# Patient Record
Sex: Male | Born: 1970 | Race: White | Hispanic: No | Marital: Married | Smoking: Current every day smoker
Health system: Southern US, Community
[De-identification: ages and names within clinical notes are randomized; demographics above are authoritative.]

## PROBLEM LIST (undated history)

## (undated) DIAGNOSIS — I10 Essential (primary) hypertension: Secondary | ICD-10-CM

## (undated) DIAGNOSIS — I639 Cerebral infarction, unspecified: Secondary | ICD-10-CM

## (undated) DIAGNOSIS — J45909 Unspecified asthma, uncomplicated: Secondary | ICD-10-CM

## (undated) HISTORY — PX: BACK SURGERY: SHX140

## (undated) HISTORY — DX: Unspecified asthma, uncomplicated: J45.909

---

## 2005-04-07 HISTORY — PX: FRACTURE SURGERY: SHX138

## 2006-04-28 ENCOUNTER — Ambulatory Visit (HOSPITAL_BASED_OUTPATIENT_CLINIC_OR_DEPARTMENT_OTHER): Admission: RE | Admit: 2006-04-28 | Discharge: 2006-04-29 | Payer: Self-pay | Admitting: Orthopaedic Surgery

## 2013-06-17 ENCOUNTER — Emergency Department: Payer: Self-pay | Admitting: Emergency Medicine

## 2013-08-12 ENCOUNTER — Emergency Department: Payer: Self-pay | Admitting: Internal Medicine

## 2014-05-20 ENCOUNTER — Emergency Department: Payer: Self-pay | Admitting: Internal Medicine

## 2017-08-28 ENCOUNTER — Ambulatory Visit (INDEPENDENT_AMBULATORY_CARE_PROVIDER_SITE_OTHER): Payer: Self-pay

## 2017-08-28 ENCOUNTER — Other Ambulatory Visit: Payer: Self-pay

## 2017-08-28 ENCOUNTER — Encounter: Payer: Self-pay | Admitting: Urgent Care

## 2017-08-28 ENCOUNTER — Ambulatory Visit: Payer: Self-pay | Admitting: Urgent Care

## 2017-08-28 VITALS — BP 140/100 | HR 88 | Temp 98.2°F | Resp 18 | Ht 69.0 in | Wt 138.0 lb

## 2017-08-28 DIAGNOSIS — T148XXA Other injury of unspecified body region, initial encounter: Secondary | ICD-10-CM

## 2017-08-28 DIAGNOSIS — R030 Elevated blood-pressure reading, without diagnosis of hypertension: Secondary | ICD-10-CM

## 2017-08-28 DIAGNOSIS — S270XXA Traumatic pneumothorax, initial encounter: Secondary | ICD-10-CM

## 2017-08-28 DIAGNOSIS — S2243XA Multiple fractures of ribs, bilateral, initial encounter for closed fracture: Secondary | ICD-10-CM

## 2017-08-28 DIAGNOSIS — S22050A Wedge compression fracture of T5-T6 vertebra, initial encounter for closed fracture: Secondary | ICD-10-CM

## 2017-08-28 DIAGNOSIS — I10 Essential (primary) hypertension: Secondary | ICD-10-CM

## 2017-08-28 DIAGNOSIS — S2249XA Multiple fractures of ribs, unspecified side, initial encounter for closed fracture: Secondary | ICD-10-CM

## 2017-08-28 MED ORDER — LISINOPRIL 10 MG PO TABS: 10.0000 mg | ORAL_TABLET | Freq: Every day | ORAL | 1 refills | Status: DC

## 2017-08-28 MED ORDER — OXYCODONE HCL 5 MG PO TABS: 5.0000 mg | ORAL_TABLET | Freq: Four times a day (QID) | ORAL | 0 refills | Status: AC | PRN

## 2017-08-28 NOTE — Patient Instructions (Addendum)
If you develop chest pain, swelling of the rib area including her lateral ribs or back, shortness of breath or difficulty breathing then please report to the ER immediately as this may be a sign that your rib fractures are put you at risk of life-threatening emergency.  Likewise, for the buttock area if you develop worsening pain, drainage of pus or swelling, fever, nausea, vomiting, belly pain then report to the ER for this is well as this is an exposed wound and can form a serious infection.  Your referral to cardiothoracic surgery may take up to 2 weeks to schedule so please feel free to come back here and see me if you have any questions or concerns.    IF you received an x-ray today, you will receive an invoice from [facility]. Please contact [facility] at [phone] with questions or concerns regarding your invoice.   IF you received labwork today, you will receive an invoice from [place]. Please contact [facility] at [phone] with questions or concerns regarding your invoice.   Our billing staff will not be able to assist you with questions regarding bills from these companies.  You will be contacted with the lab results as soon as they are available. The fastest way to get your results is to activate your My Chart account. Instructions are located on the last page of this paperwork. If you have not heard from [place] regarding the results in 2 weeks, please contact this office.

## 2017-08-28 NOTE — Progress Notes (Signed)
MRN: [id number] DOB: [birth year]  Subjective:   Ihan Pat is a 47 y.o. male presenting for follow up on traumatic fractures suffered 2 weeks ago on Aug 11, 2017.  Patient leaned over a deck too far and fell 27 feet onto his neck.  This happened while he was in [place] and was hospitalized, found to have multiple rib fractures, right index finger fracture, torn scrotum, skin avulsion of the buttock.  He was stabilized and discharged, recommended follow-up here with a PCP.  Today, patient reports that he is dramatically improved.  He states that he has not felt dizzy, headaches, confusion, nausea, vomiting, fever, belly pain, drainage of pus or bleeding from his wound over his buttock.  He states that his scrotal tear has healed very well.  He is doing dressing changes over his skin avulsion on the buttock.  He has been using incentive spirometry and reports that his breathing is improved significantly.  He is taking Tylenol and ibuprofen scheduled.  He is also using oxycodone about 4 times a day for his pain.  Patient lives here in [place] and does not have a primary care practice.  He is not opposed to a referral at this point.  Patient has a history of elevated blood pressure and has not started medication for this.  He is a former smoker.  [place] has a current medication list which includes the following prescription(s): ibuprofen, oxycodone, and lisinopril. Also has No Known Allergies.  Zachory  has no past medical history on file. Also  has no past surgical history on file.  Objective:   Vitals: BP (!) 140/100 (BP Location: Right Arm, Patient Position: Sitting, Cuff Size: Normal)   Pulse 88   Temp 98.2 F (36.8 C) (Oral)   Resp 18   Ht  (1.753 m)   Wt 138 lb (62.6 kg)   SpO2 99%   BMI 20.38 kg/m   Physical Exam  Constitutional: He is oriented to person, place, and time. He appears well-developed and well-nourished.  HENT:  Mouth/Throat: Oropharynx is clear and moist.    Voice is hoarse.  Eyes: Pupils are equal, round, and reactive to light. EOM are normal.  Neck: No tracheal deviation present.  Cardiovascular: Normal rate, regular rhythm, normal heart sounds and intact distal pulses. Exam reveals no gallop and no friction rub.  No murmur heard. Pulmonary/Chest: No stridor. No respiratory distress. He has no wheezes. He has no rales.  Musculoskeletal:       Back:  Neurological: He is alert and oriented to person, place, and time. No cranial nerve deficit.  Speech intact.  Skin: Skin is warm and dry.  Psychiatric: He has a normal mood and affect.    Dg Chest 2 View  Result Date: 08/28/2017 CLINICAL DATA:  47 y/o M; pneumothorax, rib fractures, fall on 08/11/2017. EXAM: CHEST - 2 VIEW COMPARISON:  None. FINDINGS: Right 2-7 posterior and lateral acute displaced rib fractures. Left 5-8 acute posterolateral displaced rib fractures. No pneumothorax identified. Normal cardiac silhouette. Streaky opacities in the lung bases probably represents atelectasis from splinting. Moderate anterior compression deformity of T6 vertebral body. IMPRESSION: 1. Right 2-7 and left 5-8 acute displaced rib fractures. 2. T6 moderate anterior compression deformity. 3. No pneumothorax identified. 4. Streaky opacities in lung bases, probably atelectasis from splinting. Electronically Signed   By: Mitzi Hansen M.D.   On: 08/28/2017 15:05    Assessment and Plan :   Traumatic pneumothorax, initial encounter - Plan: DG Chest 2 View,  Ambulatory referral to Cardiothoracic Surgery  Closed fracture of multiple ribs of both sides, initial encounter - Plan: DG Chest 2 View, Ambulatory referral to Cardiothoracic Surgery  Pain due to fracture  Compression fracture of T6 vertebra (HCC) - Plan: Ambulatory referral to Cardiothoracic Surgery  Closed traumatic displaced fracture of multiple ribs - Plan: Ambulatory referral to Cardiothoracic Surgery  Essential hypertension  Elevated  blood pressure reading  Case precepted with Dr. Alvy Bimler.  Patient agreed to referral to cardiothoracic surgery.  Counseled on ER precautions.  Patient is to continue scheduling Tylenol and ibuprofen, dosing instructions provided.  I refilled his oxycodone for management of his fracture pain.  Patient is to start lisinopril for his elevated blood pressure.  Monitoring parameters given.  Patient is to return to clinic in 4 weeks for recheck.  Patient is to continue dressing changes over his skin avulsion.  ER precautions reviewed for this as well.  Wallis Bamberg, PA-C [facility] [phone] 08/28/2017 2:31 PM

## 2017-09-05 ENCOUNTER — Telehealth: Payer: Self-pay | Admitting: General Practice

## 2017-09-05 NOTE — Telephone Encounter (Signed)
Pt. Calling to request refill on Oxycodone as he has not yet gotten the referral requested by PA. Mani. [facility] [address]

## 2017-09-08 ENCOUNTER — Encounter (HOSPITAL_COMMUNITY): Payer: Self-pay | Admitting: Emergency Medicine

## 2017-09-08 ENCOUNTER — Other Ambulatory Visit: Payer: Self-pay

## 2017-09-08 ENCOUNTER — Emergency Department (HOSPITAL_COMMUNITY)
Admission: EM | Admit: 2017-09-08 | Discharge: 2017-09-08 | Disposition: A | Discharge status: Home / Self Care | Payer: Self-pay | Attending: Emergency Medicine | Admitting: Emergency Medicine

## 2017-09-08 DIAGNOSIS — I10 Essential (primary) hypertension: Secondary | ICD-10-CM | POA: Insufficient documentation

## 2017-09-08 DIAGNOSIS — F17200 Nicotine dependence, unspecified, uncomplicated: Secondary | ICD-10-CM | POA: Insufficient documentation

## 2017-09-08 DIAGNOSIS — Z79899 Other long term (current) drug therapy: Secondary | ICD-10-CM | POA: Insufficient documentation

## 2017-09-08 DIAGNOSIS — K6289 Other specified diseases of anus and rectum: Secondary | ICD-10-CM | POA: Insufficient documentation

## 2017-09-08 DIAGNOSIS — W19XXXD Unspecified fall, subsequent encounter: Secondary | ICD-10-CM | POA: Insufficient documentation

## 2017-09-08 HISTORY — DX: Essential (primary) hypertension: I10

## 2017-09-08 MED ORDER — OXYCODONE HCL 5 MG PO TABS: 5.0000 mg | ORAL_TABLET | Freq: Four times a day (QID) | ORAL | 0 refills | Status: AC | PRN

## 2017-09-08 NOTE — ED Triage Notes (Signed)
States had a fall 3 weeks ago broke 5 ribs and tore his rectal area , did this in [place], ICU for 5 days , saw  [facility]   1 week ago and today he his having pain I n torn rectal area,  And back pain

## 2017-09-08 NOTE — ED Notes (Signed)
ED Provider at bedside. 

## 2017-09-08 NOTE — ED Provider Notes (Signed)
**Note Isaac-Identified via Obfuscation** [facility] EMERGENCY DEPARTMENT Provider Note  CSN: [id number] Arrival date & time: 09/08/17  1116  History   Chief Complaint Chief Complaint  Patient presents with  . Rectal Pain   HPI Isaac Meza is a 47 y.o. male with no signifcant medical history who presented to the ED for rectal pain. Patient fell down a [place] in [place] 3-4 weeks ago where he was hospitalized for a week and had 7 broken ribs, a tear in his scrotum and trauma to his buttocks. During that hospitalization, he underwent laceration repair on his scrotum and debridement for the wound in his buttocks. He has not been seen by a general surgeon in [place] for follow-up regarding treatment for it.   Associated symptoms: none. Denies fever, chills, abdominal pain, N/V, change in bowel or urinary habits. He states that there is blood sometimes from the wound site with clear discharge, but has not noticed any acute changes in this. Patient went to urgent care on 08/28/17, but did not receive instruction for follow-up on his buttock wound. Patient came to the ED because it continues to have buttock pain that it is not being relieved with Tylenol or ibuprofen.   Medical records from ED visit and hospitalization in [place] at the time of the initial injury could not be found in Epic. Father assisted in providing history above as he was present at the time of the accident.  Past Medical History:  Diagnosis Date  . Hypertension     There are no active problems to display for this patient.   History reviewed. No pertinent surgical history.      Home Medications    Prior to Admission medications   Medication Sig Start Date End Date Taking? Authorizing Provider  IBUPROFEN PO Take by mouth.    [provider]  lisinopril (PRINIVIL,ZESTRIL) 10 MG tablet Take 1 tablet (10 mg total) by mouth daily. 08/28/17   Wallis Bamberg, PA-C  oxyCODONE (ROXICODONE) 5 MG immediate release tablet Take 1 tablet (5 mg total) by  mouth every 6 (six) hours as needed for up to 7 days for severe pain. 09/08/17 09/15/17  Sandrea Boer, Sharyon Medicus, PA-C    Family History No family history on file.  Social History Social History   Tobacco Use  . Smoking status: Current Every Day Smoker  . Smokeless tobacco: Never Used  Substance Use Topics  . Alcohol use: Yes  . Drug use: Never     Allergies   Patient has no known allergies.   Review of Systems Review of Systems  Constitutional: Negative for appetite change, chills, fatigue and fever.  Gastrointestinal: Positive for rectal pain. Negative for abdominal distention, abdominal pain, anal bleeding, blood in stool, constipation, diarrhea, nausea and vomiting.  Endocrine: Negative.   Genitourinary: Negative.  Negative for discharge, hematuria, scrotal swelling and testicular pain.  Skin: Positive for wound.     Physical Exam Updated Vital Signs BP (!) 152/108 (BP Location: Right Arm)   Pulse 87   Temp 97.7 F (36.5 C) (Oral)   Resp 16   SpO2 100%   Physical Exam  Constitutional: He appears well-developed and well-nourished. No distress.  Cardiovascular: Normal rate, regular rhythm, normal heart sounds and intact distal pulses.  No murmur heard. Pulmonary/Chest: Effort normal and breath sounds normal.  Abdominal: Soft. Normal appearance and bowel sounds are normal. There is no tenderness.  Genitourinary: Testes normal and penis normal. Rectal exam shows anal tone normal.    Right testis shows no  swelling and no tenderness. Left testis shows no swelling and no tenderness. Circumcised. No penile erythema or penile tenderness.  Genitourinary Comments: ~3x3cm wound on right buttock that is ~0.883mm deep. Wound is well healing. No erythema, warmth, drainage or bleeding in or around wound. Wound is tender to palpation. No signs of infection.   Skin: Skin is warm and dry. He is not diaphoretic. No erythema.  Nursing note and vitals reviewed.    ED Treatments /  Results  Labs (all labs ordered are listed, but only abnormal results are displayed) Labs Reviewed - No data to display  EKG None  Radiology No results found.  Procedures Procedures (including critical care time)  Medications Ordered in ED Medications - No data to display   Initial Impression / Assessment and Plan / ED Course  Triage vital signs and the nursing notes have been reviewed.  Pertinent labs & imaging results that were available during care of the patient were reviewed and considered in medical decision making (see chart for details).   Patient presents in no acute distress and is visibly uncomfortable. Afebrile with normal vital signs. Patient has no systemic s/s to suggest an intra-abdominal process or widespread infection. Rectal wound is healing well. No signs of ulceration or necrosis. It is not actively bleeding or has discharge. Referral to general surgery given due to depth and size of wound. Education provided on appropriate cleaning and red flags/warning symptoms to be aware of that would prompt return to ED.   Final Clinical Impressions(s) / ED Diagnoses  1. Rectal Pain. 2/2 to past injury. Roxicodone 5mg  Q6H PRN x7 days given for pain control. Referral to general surgery for further management. Education provided on s/s that would warrant return to ED, including but not limited to, fever, abdominal pain, bleeding from rectum, hematochezia/melena and changes in wound discharge or type of pain he experiences.  Dispo: Home. After thorough clinical evaluation, this patient is determined to be medically stable and can be safely discharged with the previously mentioned treatment and/or outpatient follow-up/referral(s). At this time, there are no other apparent medical conditions that require further screening, evaluation or treatment.  Final diagnoses:  Rectal pain    ED Discharge Orders        Ordered    oxyCODONE (ROXICODONE) 5 MG immediate release tablet  Every  6 hours PRN     09/08/17 1236        Xzavior Reinig, SeibertGabrielle I, PA-C 09/08/17 1320    Tilden Fossaees, Elizabeth, MD 09/09/17 1556

## 2017-09-08 NOTE — Discharge Instructions (Signed)
Follow-up with general surgery ASAP to discuss appropriate treatment for rectal wound.

## 2017-09-10 ENCOUNTER — Other Ambulatory Visit: Payer: Self-pay | Admitting: Urgent Care

## 2017-09-10 DIAGNOSIS — T148XXA Other injury of unspecified body region, initial encounter: Secondary | ICD-10-CM

## 2017-09-10 DIAGNOSIS — R0789 Other chest pain: Secondary | ICD-10-CM

## 2017-09-10 DIAGNOSIS — S22050A Wedge compression fracture of T5-T6 vertebra, initial encounter for closed fracture: Secondary | ICD-10-CM

## 2017-09-10 DIAGNOSIS — S2249XA Multiple fractures of ribs, unspecified side, initial encounter for closed fracture: Secondary | ICD-10-CM

## 2017-09-10 DIAGNOSIS — S2243XS Multiple fractures of ribs, bilateral, sequela: Secondary | ICD-10-CM

## 2017-09-10 DIAGNOSIS — S270XXD Traumatic pneumothorax, subsequent encounter: Secondary | ICD-10-CM

## 2017-09-24 ENCOUNTER — Emergency Department
Admission: EM | Admit: 2017-09-24 | Discharge: 2017-09-24 | Disposition: A | Discharge status: Home / Self Care | Payer: Self-pay | Attending: Emergency Medicine | Admitting: Emergency Medicine

## 2017-09-24 ENCOUNTER — Other Ambulatory Visit: Payer: Self-pay

## 2017-09-24 ENCOUNTER — Encounter: Payer: Self-pay | Admitting: Emergency Medicine

## 2017-09-24 DIAGNOSIS — S3663XD Laceration of rectum, subsequent encounter: Secondary | ICD-10-CM | POA: Insufficient documentation

## 2017-09-24 DIAGNOSIS — T148XXA Other injury of unspecified body region, initial encounter: Secondary | ICD-10-CM

## 2017-09-24 DIAGNOSIS — I10 Essential (primary) hypertension: Secondary | ICD-10-CM | POA: Insufficient documentation

## 2017-09-24 DIAGNOSIS — Z79899 Other long term (current) drug therapy: Secondary | ICD-10-CM | POA: Insufficient documentation

## 2017-09-24 DIAGNOSIS — X58XXXA Exposure to other specified factors, initial encounter: Secondary | ICD-10-CM | POA: Insufficient documentation

## 2017-09-24 DIAGNOSIS — F17210 Nicotine dependence, cigarettes, uncomplicated: Secondary | ICD-10-CM | POA: Insufficient documentation

## 2017-09-24 MED ORDER — OXYCODONE-ACETAMINOPHEN 5-325 MG PO TABS: 1.0000 | ORAL_TABLET | Freq: Four times a day (QID) | ORAL | 0 refills | Status: DC | PRN

## 2017-09-24 NOTE — ED Provider Notes (Signed)
[facility] Emergency Department Provider Note  ____________________________________________   First MD Initiated Contact with Patient 09/24/17 1121     (approximate)  I have reviewed the triage vital signs and the nursing notes.   HISTORY  Chief Complaint Fall   HPI Isaac Meza is a 47 y.o. male is here with complaint of continued perirectal pain.  Patient fell approximately 6 weeks ago and had multiple injuries.  He had a skin avulsion to the rectal area and continues to have pain in that area.  He denies any fever or chills.  He states he has been using peroxide to the area.  He denies any difficulty with BMs other than pain.  He took his last pain pill this morning.  Currently rates pain as 7 out of 10.  Past Medical History:  Diagnosis Date  . Hypertension     There are no active problems to display for this patient.   History reviewed. No pertinent surgical history.  Prior to Admission medications   Medication Sig Start Date End Date Taking? Authorizing Provider  IBUPROFEN PO Take by mouth.    [provider]  lisinopril (PRINIVIL,ZESTRIL) 10 MG tablet Take 1 tablet (10 mg total) by mouth daily. 08/28/17   Wallis BambergMani, Mario, PA-C  oxyCODONE-acetaminophen (PERCOCET) 5-325 MG tablet Take 1 tablet by mouth every 6 (six) hours as needed for severe pain. 09/24/17   Tommi RumpsSummers, Toccara Alford L, PA-C    Allergies Patient has no known allergies.  History reviewed. No pertinent family history.  Social History Social History   Tobacco Use  . Smoking status: Current Every Day Smoker  . Smokeless tobacco: Never Used  Substance Use Topics  . Alcohol use: Yes  . Drug use: Never    Review of Systems Constitutional: No fever/chills Cardiovascular: Denies chest pain. Respiratory: Denies shortness of breath.  Positive for recent rib fractures. Gastrointestinal: No abdominal pain.  No nausea, no vomiting.  No diarrhea.  No constipation. Genitourinary:  Negative for dysuria. Musculoskeletal: Negative for back pain. Skin: Positive for skin injury perirectal. Neurological: Negative for headaches, focal weakness or numbness. ____________________________________________   PHYSICAL EXAM:  VITAL SIGNS: ED Triage Vitals  Enc Vitals Group     BP 09/24/17 1110 (!) 158/94     Pulse Rate 09/24/17 1110 86     Resp 09/24/17 1110 16     Temp 09/24/17 1110 98 F (36.7 C)     Temp Source 09/24/17 1110 Oral     SpO2 09/24/17 1110 100 %     Weight 09/24/17 1111 136 lb 12.8 oz (62.1 kg)     Height 09/24/17 1111 5\' 9"  (1.753 m)     Head Circumference --      Peak Flow --      Pain Score 09/24/17 1110 7     Pain Loc --      Pain Edu? --      Excl. in GC? --     Constitutional: Alert and oriented. Well appearing and in no acute distress. Eyes: Conjunctivae are normal.  Head: Atraumatic. Neck: No stridor.   Cardiovascular: Normal rate, regular rhythm. Grossly normal heart sounds.  Good peripheral circulation. Respiratory: Normal respiratory effort.  No retractions. Lungs CTAB. Musculoskeletal: Moves upper and lower extremities without any difficulty and normal gait was noted. Neurologic:  Normal speech and language. No gross focal neurologic deficits are appreciated.  Skin:  Skin is warm, dry.  Patient has a skin avulsion noted without any active drainage.  Surrounding tissue does not show any evidence of infection.  There is approximately a 2 cm x 1 cm area present.  In reviewing documentation at [facility] this apparently is healing as it looks smaller than noted during that visit. Psychiatric: Mood and affect are normal. Speech and behavior are normal.  ____________________________________________   LABS (all labs ordered are listed, but only abnormal results are displayed)  Labs Reviewed - No data to display   PROCEDURES  Procedure(s) performed: None  Procedures  Critical Care performed:  No  ____________________________________________   INITIAL IMPRESSION / ASSESSMENT AND PLAN / ED COURSE  As part of my medical decision making, I reviewed the following data within the electronic MEDICAL RECORD NUMBER Notes from prior ED visits and [place] Controlled Substance Database  Patient was given the name of a general surgeon to follow-up in [place] however he is not called made an appointment.  Patient does live in [place] and is encouraged to call Dr. Everlene Farrier  who is the general surgeon on-call in [place].  He was given Percocet No. 12 1 every 6 hours as needed for pain.  He is encouraged to take a stool softener to avoid constipation.  ____________________________________________   FINAL CLINICAL IMPRESSION(S) / ED DIAGNOSES  Final diagnoses:  Avulsion, skin     ED Discharge Orders        Ordered    oxyCODONE-acetaminophen (PERCOCET) 5-325 MG tablet  Every 6 hours PRN     09/24/17 1151       Note:  This document was prepared using Dragon voice recognition software and may include unintentional dictation errors.    Tommi Rumps, PA-C 09/24/17 1540    Emily Filbert, MD 09/25/17 [phone]

## 2017-09-24 NOTE — ED Notes (Signed)
Says he had fall with severe injuries in [place] about 5-6 weeks ago.  Was in icu for 4 days with 7 rib fx and had damage to rectal area that was repaired.  He says he is having increased pain at wound near the rectum, but not rectal pain.  Also says ribs are still sore and he is out of pain meds.  Says he went to [place] since his accident and they have his xrays.  Patient is nad, is moving without difficulty.  Will undress for exam now.

## 2017-09-24 NOTE — Discharge Instructions (Addendum)
Call the surgeon listed on your discharge papers.  Dr. Hurman HornPabon's contact information and office address is listed on your discharge papers.  Discontinue using peroxide to this area. Percocet every 6 hours if needed for pain.  Take stool softeners to prevent constipation while taking narcotics. [facility] as needed.

## 2017-09-24 NOTE — ED Triage Notes (Signed)
Pt states that he fell 6 weeks ago and is complaining of pain when he sits down to quickly. Pt is able to sit and cross legs without difficulty. He is able to ambulate without difficulty.

## 2017-10-13 ENCOUNTER — Ambulatory Visit: Payer: Self-pay | Admitting: Surgery

## 2017-10-13 ENCOUNTER — Encounter: Payer: Self-pay | Admitting: Surgery

## 2017-10-13 VITALS — BP 155/104 | HR 99 | Temp 98.7°F | Ht 69.0 in | Wt 138.0 lb

## 2017-10-13 DIAGNOSIS — M546 Pain in thoracic spine: Secondary | ICD-10-CM

## 2017-10-13 DIAGNOSIS — T148XXA Other injury of unspecified body region, initial encounter: Secondary | ICD-10-CM

## 2017-10-13 MED ORDER — CYCLOBENZAPRINE HCL 5 MG PO TABS: 5.0000 mg | ORAL_TABLET | Freq: Three times a day (TID) | ORAL | 0 refills | Status: DC | PRN

## 2017-10-13 NOTE — Patient Instructions (Addendum)
We will refer you out to be seen by the Pain [facility]. Please allow them to review your chart to let you know when they could see you.  Your prescription was sent to your pharmacy.

## 2017-10-13 NOTE — Progress Notes (Signed)
10/13/2017  Reason for Visit:    History of Present Illness: Isaac Meza is a 47 y.o. male with a history of a fall down a mountain in [place] in May 2019, which resulted in multiple bilateral rib fractures, compression T6 fracture, and trauma to the scrotum and perianal skin.  This resulted in avulsion of a portion of the right buttocks skin, just lateral to the anal verge.  He has gone to the ED twice with pain issues from the perianal wound.  However, now he's better and reports that the wound has fully healed.  He presents more because of persistent bilateral upper back pain.  He also has occasional sternal pain.  He had a referral for cardiothoracic surgery, but the patient reports that he was never contacted by anyone.  There is also an order for an outpatient CT of the chest which has yet to be done.  He reports that due to the upper back pain, he is unable to work and also is unable to do any heavy lifting.  He had a short prescription for Percocet which he reports has helped.  Past Medical History: Past Medical History:  Diagnosis Date  . Hypertension      Past Surgical History: Surgical repair of scrotal injury.   Home Medications: Prior to Admission medications   Medication Sig Start Date End Date Taking? Authorizing Provider  IBUPROFEN PO Take by mouth.   Yes [provider]  lisinopril (PRINIVIL,ZESTRIL) 10 MG tablet Take 1 tablet (10 mg total) by mouth daily. 08/28/17  Yes Wallis Bamberg, PA-C  cyclobenzaprine (FLEXERIL) 5 MG tablet Take 1 tablet (5 mg total) by mouth 3 (three) times daily as needed for muscle spasms. 10/13/17   Henrene Dodge, MD    Allergies: No Known Allergies  Social History:  reports that he has been smoking.  He has never used smokeless tobacco. He reports that he drinks alcohol. He reports that he does not use drugs.   Family History: History reviewed. No pertinent family history.  Review of Systems: Review of Systems  Constitutional: Negative  for chills and fever.  HENT: Negative for hearing loss.   Respiratory: Negative for shortness of breath.   Cardiovascular: Negative for chest pain.  Gastrointestinal: Negative for abdominal pain, nausea and vomiting.  Genitourinary: Negative for dysuria.  Musculoskeletal: Positive for back pain and myalgias.  Skin: Negative for rash.  Neurological: Negative for dizziness.  Psychiatric/Behavioral: Negative for depression.  All other systems reviewed and are negative.   Physical Exam BP (!) 155/104   Pulse 99   Temp 98.7 F (37.1 C) (Oral)   Ht 5\' 9"  (1.753 m)   Wt 138 lb (62.6 kg)   BMI 20.38 kg/m  CONSTITUTIONAL: No acute distress HEENT:  Normocephalic, atraumatic, extraocular motion intact. NECK: Trachea is midline, and there is no jugular venous distension.  RESPIRATORY:  Lungs are clear, and breath sounds are equal bilaterally. Normal respiratory effort without pathologic use of accessory muscles. CARDIOVASCULAR: Heart is regular without murmurs, gallops, or rubs. GI: The abdomen is soft, nondistended, nontender. RECTAL:  Right medial perianal skin injury about 1 cm away from anal verge is well healed.  Rectal exam shows good tone and no ulcers or injury. MUSCULOSKELETAL:  Upper back with very tight and bulky muscles, with some tenderness to palpation at bilateral paraspinal muscles.  There is an area of bulging over the right upper back, likely from a displaced rib fracture, between spine and scapula.   SKIN: Skin turgor is  normal. There are no pathologic skin lesions.  NEUROLOGIC:  Motor and sensation is grossly normal.  Cranial nerves are grossly intact. PSYCH:  Alert and oriented to person, place and time. Affect is normal.  Laboratory Analysis: No results found for this or any previous visit (from the past 24 hour(s)).  Imaging: No results found.  Assessment and Plan: This is a 47 y.o. male who presents with a now healed perianal skin avulsion and persistent upper  back pain as a result of a trauma two months ago.  Discussed with the patient that his perianal wound has well healed and there are no surgical needs at this point.  From the back and rib fracture standpoint, discussed with Dr. Thelma Bargeaks about his fractures and pain and given that he's two months out of his trauma, it would not be beneficial for the patient to do any surgical intervention to the displaced rib fractures.  At this point, would instead recommend a referral to the Pain clinic to help with his pain symptoms and muscle relaxation of his upper back.  The upper back musculature does appear to be in spasm from the fractures which could be contributing to his pain.  Will give him as well a prescription for Flexeril to see if this helps.    Face-to-face time spent with the patient and care providers was 30 minutes, with more than 50% of the time spent counseling, educating, and coordinating care of the patient.     Howie IllJose Luis Tahlor Berenguer, MD [facility]

## 2017-10-27 ENCOUNTER — Telehealth: Payer: Self-pay | Admitting: Surgery

## 2017-10-27 NOTE — Telephone Encounter (Signed)
Patient was referred to the pain clinic per Dr Adelene IdlerPiscoya's request. Pain clinic contacted the patient and per the note in the referral--spoke with pt on 10/26/2017 @ 2:03pm concerning insurance coverage. Pt made me aware that his pain is now gone and for me to stop processing his referral for review...td  Referral was canceled. I will send this message to Dr Aleen CampiPiscoya and CMA.

## 2018-04-20 ENCOUNTER — Other Ambulatory Visit: Payer: Self-pay

## 2018-04-20 ENCOUNTER — Ambulatory Visit: Payer: Self-pay | Admitting: Surgery

## 2018-04-20 ENCOUNTER — Encounter: Payer: Self-pay | Admitting: Surgery

## 2018-04-20 ENCOUNTER — Telehealth: Payer: Self-pay

## 2018-04-20 VITALS — BP 182/142 | HR 88 | Temp 96.9°F | Resp 16 | Ht 68.0 in | Wt 143.0 lb

## 2018-04-20 DIAGNOSIS — S2243XS Multiple fractures of ribs, bilateral, sequela: Secondary | ICD-10-CM

## 2018-04-20 DIAGNOSIS — S22050A Wedge compression fracture of T5-T6 vertebra, initial encounter for closed fracture: Secondary | ICD-10-CM

## 2018-04-20 NOTE — Telephone Encounter (Signed)
Referrals have been placed to Cardiothoracic surgeon Dr.Oaks, Dr,Menz Orthopaedic surgeon and the pain clinic.   Appointment for DR.Thelma Barge is scheduled 04/30/2018 @ 9:45 am. Patient instructed to have chest xray prior to seeing Dr.Oaks.  I let patient know the referral to Dr.Menz and the pain clinic will contact him within 5-7 days. If he has not heard from their office within the time frame listed above to call our office and let us know.

## 2018-04-20 NOTE — Progress Notes (Signed)
Referral to cardiothoracici
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
## 2018-04-28 ENCOUNTER — Telehealth: Payer: Self-pay | Admitting: *Deleted

## 2018-04-28 NOTE — Telephone Encounter (Signed)
Revonda Standard from [facility] called in regards to the referral they received on this patient. She stated that Dr.Menz does not treat patients with compression fractures a his age and due to the fact that's its old, she stated that he may be a candidate to see a Retail buyer. Please call Revonda Standard

## 2018-04-28 NOTE — Telephone Encounter (Signed)
Spoke with Revonda Standard at Dr Rosita Kea office and they had also consulted with Neurosurgery and they said that the patient should see pain management first and then if no improvement then pain management would refer to Neurosurgery.  I have spoke with the patient and notified him of such.

## 2018-04-30 ENCOUNTER — Encounter: Payer: Self-pay | Admitting: Cardiothoracic Surgery

## 2018-04-30 ENCOUNTER — Ambulatory Visit
Admission: RE | Admit: 2018-04-30 | Discharge: 2018-04-30 | Disposition: A | Discharge status: Home / Self Care | Payer: Self-pay | Source: Ambulatory Visit | Attending: Cardiothoracic Surgery | Admitting: Cardiothoracic Surgery

## 2018-04-30 ENCOUNTER — Other Ambulatory Visit: Payer: Self-pay

## 2018-04-30 ENCOUNTER — Ambulatory Visit
Admission: RE | Admit: 2018-04-30 | Discharge: 2018-04-30 | Disposition: A | Discharge status: Home / Self Care | Payer: Self-pay | Attending: Cardiothoracic Surgery | Admitting: Cardiothoracic Surgery

## 2018-04-30 ENCOUNTER — Ambulatory Visit (INDEPENDENT_AMBULATORY_CARE_PROVIDER_SITE_OTHER): Payer: Self-pay | Admitting: Cardiothoracic Surgery

## 2018-04-30 VITALS — BP 166/127 | HR 106 | Temp 97.9°F | Resp 18 | Ht 69.0 in | Wt 141.2 lb

## 2018-04-30 DIAGNOSIS — S2243XS Multiple fractures of ribs, bilateral, sequela: Secondary | ICD-10-CM

## 2018-04-30 NOTE — Patient Instructions (Addendum)
Patient will need a referral to see Dr.Yarbrough neurosurgeon at [facility] clinic.   Call the office with any questions or concerns.
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
## 2018-05-13 ENCOUNTER — Emergency Department: Payer: Self-pay

## 2018-05-13 ENCOUNTER — Emergency Department
Admission: EM | Admit: 2018-05-13 | Discharge: 2018-05-13 | Disposition: A | Discharge status: Home / Self Care | Payer: Self-pay | Attending: Emergency Medicine | Admitting: Emergency Medicine

## 2018-05-13 ENCOUNTER — Encounter: Payer: Self-pay | Admitting: Emergency Medicine

## 2018-05-13 DIAGNOSIS — Y998 Other external cause status: Secondary | ICD-10-CM | POA: Insufficient documentation

## 2018-05-13 DIAGNOSIS — W228XXA Striking against or struck by other objects, initial encounter: Secondary | ICD-10-CM | POA: Insufficient documentation

## 2018-05-13 DIAGNOSIS — Y9389 Activity, other specified: Secondary | ICD-10-CM | POA: Insufficient documentation

## 2018-05-13 DIAGNOSIS — S20212A Contusion of left front wall of thorax, initial encounter: Secondary | ICD-10-CM | POA: Insufficient documentation

## 2018-05-13 DIAGNOSIS — F17210 Nicotine dependence, cigarettes, uncomplicated: Secondary | ICD-10-CM | POA: Insufficient documentation

## 2018-05-13 DIAGNOSIS — Y92000 Kitchen of unspecified non-institutional (private) residence as  the place of occurrence of the external cause: Secondary | ICD-10-CM | POA: Insufficient documentation

## 2018-05-13 MED ORDER — LIDOCAINE 5 % EX PTCH
1.0000 | MEDICATED_PATCH | CUTANEOUS | Status: DC
  Administered 2018-05-13: 1 via TRANSDERMAL
  Filled 2018-05-13: qty 1

## 2018-05-13 MED ORDER — OXYCODONE-ACETAMINOPHEN 7.5-325 MG PO TABS: 1.0000 | ORAL_TABLET | Freq: Four times a day (QID) | ORAL | 0 refills | Status: DC | PRN

## 2018-05-13 NOTE — ED Provider Notes (Signed)
[facility] Emergency Department Provider Note   ____________________________________________   First MD Initiated Contact with Patient 05/13/18 1203     (approximate)  I have reviewed the triage vital signs and the nursing notes.   HISTORY  Chief Complaint Rib Injury    HPI Isaac Meza is a 48 y.o. male patient presents with left lateral rib pain secondary to contusion yesterday.  Patient state he was working under a sink when he rolled onto the left side.  Patient has a history of multiple rib fractures from a previous injury last year.  Due to his continued pain patient has been referred to pain management clinic.  Today patient rates his pain as 9/10.  Patient described the pain as "achy".  No palliative measure for complaint.  Patient denies dyspnea.    Past Medical History:  Diagnosis Date  . Hypertension     Patient Active Problem List   Diagnosis Date Noted  . Bilateral thoracic back pain 10/13/2017    History reviewed. No pertinent surgical history.  Prior to Admission medications   Medication Sig Start Date End Date Taking? Authorizing Provider  IBUPROFEN PO Take by mouth.    [provider]  oxyCODONE-acetaminophen (PERCOCET) 7.5-325 MG tablet Take 1 tablet by mouth every 6 (six) hours as needed for severe pain. 05/13/18   Joni ReiningSmith, Agness Sibrian K, PA-C    Allergies Patient has no known allergies.  No family history on file.  Social History Social History   Tobacco Use  . Smoking status: Current Every Day Smoker    Packs/day: 1.00  . Smokeless tobacco: Never Used  Substance Use Topics  . Alcohol use: Yes  . Drug use: Never    Review of Systems Constitutional: No fever/chills Eyes: No visual changes. ENT: No sore throat. Cardiovascular: Denies chest pain. Respiratory: Denies shortness of breath. Gastrointestinal: No abdominal pain.  No nausea, no vomiting.  No diarrhea.  No constipation. Genitourinary: Negative for  dysuria. Musculoskeletal: Left lateral rib pain. Skin: Negative for rash.  Abrasion left lateral chest wall. Neurological: Negative for headaches, focal weakness or numbness.   ____________________________________________   PHYSICAL EXAM:  VITAL SIGNS: ED Triage Vitals  Enc Vitals Group     BP --      Pulse --      Resp --      Temp --      Temp src --      SpO2 --      Weight 05/13/18 1139 153 lb (69.4 kg)     Height 05/13/18 1139 5\' 9"  (1.753 m)     Head Circumference --      Peak Flow --      Pain Score 05/13/18 1138 9     Pain Loc --      Pain Edu? --      Excl. in GC? --     Constitutional: Alert and oriented. Well appearing and in no acute distress. Cardiovascular: Normal rate, regular rhythm. Grossly normal heart sounds.  Good peripheral circulation. Respiratory: Normal respiratory effort.  No retractions. Lungs CTAB. Musculoskeletal: No chest wall deformity.  Patient has moderate guarding palpation left lateral T8-T11.  Neurologic:  Normal speech and language. No gross focal neurologic deficits are appreciated. No gait instability. Skin:  Skin is warm, dry and intact. No rash noted.  Small abrasion at T10. Psychiatric: Mood and affect are normal. Speech and behavior are normal.  ____________________________________________   LABS (all labs ordered are listed, but only abnormal  results are displayed)  Labs Reviewed - No data to display ____________________________________________  EKG   ____________________________________________  RADIOLOGY  ED MD interpretation:    Official radiology report(s): Dg Ribs Unilateral W/chest Left  Result Date: 05/13/2018 CLINICAL DATA:  Bad fall in May of last year breaking a couple ribs on LEFT side, had lung surgery, acute onset of pain yesterday while working under a sink question re-injury EXAM: LEFT RIBS AND CHEST - 3+ VIEW COMPARISON:  04/30/2018 chest radiograph FINDINGS: Normal heart size, mediastinal contours, and  pulmonary vascularity. Minimal bibasilar atelectasis and central peribronchial thickening. Lungs otherwise clear. No infiltrate, pleural effusion, or pneumothorax. Multiple old LEFT rib fractures fourth through tenth. Old fractures of the posterolateral RIGHT second third fourth and fifth ribs. BB placed at site of symptoms anterior lower LEFT chest. No acute rib fracture or bone destruction. IMPRESSION: Minimal bibasilar atelectasis and central bronchitic changes. Multiple old BILATERAL rib fractures. No acute abnormalities. Electronically Signed   By: Ulyses Southward M.D.   On: 05/13/2018 13:01    ____________________________________________   PROCEDURES  Procedure(s) performed: None  Procedures  Critical Care performed: No  ____________________________________________   INITIAL IMPRESSION / ASSESSMENT AND PLAN / ED COURSE  As part of my medical decision making, I reviewed the following data within the electronic MEDICAL RECORD NUMBER     Left lateral rib pain secondary to contusion.  Discussed  x-ray findings with patient.  Patient given discharge care instruction.  Patient had a Lidoderm patch applied prior to departure.  Patient to follow-up with scheduled pain management clinic.      ____________________________________________   FINAL CLINICAL IMPRESSION(S) / ED DIAGNOSES  Final diagnoses:  Rib contusion, left, initial encounter     ED Discharge Orders         Ordered    oxyCODONE-acetaminophen (PERCOCET) 7.5-325 MG tablet  Every 6 hours PRN     05/13/18 1331           Note:  This document was prepared using Dragon voice recognition software and may include unintentional dictation errors.    Joni Reining, PA-C 05/13/18 1336    Schaevitz, Myra Rude, MD 05/13/18 [phone]

## 2018-05-13 NOTE — ED Triage Notes (Signed)
Pt reports was working under the sink yesterday and rolled over hurting his eft ribs. Pt states has an appointment at the pain clinic but cannot wait.

## 2018-05-13 NOTE — ED Notes (Signed)
See triage note  Presents with left rib pain   States he has hx of several broken ribs d/t fall   States he was working under a sink and leaned against left lateral rib  Having increased pain with movement inspiration

## 2018-05-13 NOTE — Discharge Instructions (Signed)
Follow-up scheduled pain management clinic.

## 2018-05-27 ENCOUNTER — Encounter: Payer: Self-pay | Admitting: Student in an Organized Health Care Education/Training Program

## 2018-05-27 ENCOUNTER — Other Ambulatory Visit: Payer: Self-pay

## 2018-05-27 ENCOUNTER — Ambulatory Visit
Payer: Medicaid Other | Attending: Student in an Organized Health Care Education/Training Program | Admitting: Student in an Organized Health Care Education/Training Program

## 2018-05-27 VITALS — BP 189/120 | HR 92 | Temp 98.2°F | Resp 18 | Ht 69.0 in | Wt 143.4 lb

## 2018-05-27 DIAGNOSIS — M542 Cervicalgia: Secondary | ICD-10-CM | POA: Insufficient documentation

## 2018-05-27 DIAGNOSIS — I10 Essential (primary) hypertension: Secondary | ICD-10-CM | POA: Insufficient documentation

## 2018-05-27 DIAGNOSIS — M546 Pain in thoracic spine: Secondary | ICD-10-CM

## 2018-05-27 DIAGNOSIS — R61 Generalized hyperhidrosis: Secondary | ICD-10-CM | POA: Insufficient documentation

## 2018-05-27 DIAGNOSIS — G894 Chronic pain syndrome: Secondary | ICD-10-CM

## 2018-05-27 DIAGNOSIS — J45909 Unspecified asthma, uncomplicated: Secondary | ICD-10-CM | POA: Insufficient documentation

## 2018-05-27 DIAGNOSIS — G588 Other specified mononeuropathies: Secondary | ICD-10-CM

## 2018-05-27 DIAGNOSIS — M792 Neuralgia and neuritis, unspecified: Secondary | ICD-10-CM | POA: Insufficient documentation

## 2018-05-27 DIAGNOSIS — F17210 Nicotine dependence, cigarettes, uncomplicated: Secondary | ICD-10-CM | POA: Insufficient documentation

## 2018-05-27 DIAGNOSIS — W15XXXS Fall from cliff, sequela: Secondary | ICD-10-CM

## 2018-05-27 DIAGNOSIS — S22050S Wedge compression fracture of T5-T6 vertebra, sequela: Secondary | ICD-10-CM

## 2018-05-27 DIAGNOSIS — S2243XS Multiple fractures of ribs, bilateral, sequela: Secondary | ICD-10-CM

## 2018-05-27 DIAGNOSIS — G8929 Other chronic pain: Secondary | ICD-10-CM

## 2018-05-27 MED ORDER — TIZANIDINE HCL 4 MG PO TABS: 4.0000 mg | ORAL_TABLET | Freq: Two times a day (BID) | ORAL | 1 refills | Status: AC | PRN

## 2018-05-27 NOTE — Progress Notes (Signed)
Safety precautions to be maintained throughout the outpatient stay will include: orient to surroundings, keep bed in low position, maintain call bell within reach at all times, provide assistance with transfer out of bed and ambulation.  

## 2018-05-27 NOTE — Patient Instructions (Signed)
____________________________________________________________________________________________  Preparing for Procedure with Sedation  Instructions: . Oral Intake: Do not eat or drink anything for at least 8 hours prior to your procedure. . Transportation: Public transportation is not allowed. Bring an adult driver. The driver must be physically present in our waiting room before any procedure can be started. . Physical Assistance: Bring an adult physically capable of assisting you, in the event you need help. This adult should keep you company at home for at least 6 hours after the procedure. . Blood Pressure Medicine: Take your blood pressure medicine with a sip of water the morning of the procedure. . Blood thinners: Notify our staff if you are taking any blood thinners. Depending on which one you take, there will be specific instructions on how and when to stop it. . Diabetics on insulin: Notify the staff so that you can be scheduled 1st case in the morning. If your diabetes requires high dose insulin, take only  of your normal insulin dose the morning of the procedure and notify the staff that you have done so. . Preventing infections: Shower with an antibacterial soap the morning of your procedure. . Build-up your immune system: Take 1000 mg of Vitamin C with every meal (3 times a day) the day prior to your procedure. . Antibiotics: Inform the staff if you have a condition or reason that requires you to take antibiotics before dental procedures. . Pregnancy: If you are pregnant, call and cancel the procedure. . Sickness: If you have a cold, fever, or any active infections, call and cancel the procedure. . Arrival: You must be in the facility at least 30 minutes prior to your scheduled procedure. . Children: Do not bring children with you. . Dress appropriately: Bring dark clothing that you would not mind if they get stained. . Valuables: Do not bring any jewelry or valuables.  Procedure  appointments are reserved for interventional treatments only. . No Prescription Refills. . No medication changes will be discussed during procedure appointments. . No disability issues will be discussed.  Reasons to call and reschedule or cancel your procedure: (Following these recommendations will minimize the risk of a serious complication.) . Surgeries: Avoid having procedures within 2 weeks of any surgery. (Avoid for 2 weeks before or after any surgery). . Flu Shots: Avoid having procedures within 2 weeks of a flu shots or . (Avoid for 2 weeks before or after immunizations). . Barium: Avoid having a procedure within 7-10 days after having had a radiological study involving the use of radiological contrast. (Myelograms, Barium swallow or enema study). . Heart attacks: Avoid any elective procedures or surgeries for the initial 6 months after a "Myocardial Infarction" (Heart Attack). . Blood thinners: It is imperative that you stop these medications before procedures. Let us know if you if you take any blood thinner.  . Infection: Avoid procedures during or within two weeks of an infection (including chest colds or gastrointestinal problems). Symptoms associated with infections include: Localized redness, fever, chills, night sweats or profuse sweating, burning sensation when voiding, cough, congestion, stuffiness, runny nose, sore throat, diarrhea, nausea, vomiting, cold or Flu symptoms, recent or current infections. It is specially important if the infection is over the area that we intend to treat. . Heart and lung problems: Symptoms that may suggest an active cardiopulmonary problem include: cough, chest pain, breathing difficulties or shortness of breath, dizziness, ankle swelling, uncontrolled high or unusually low blood pressure, and/or palpitations. If you are experiencing any of these symptoms, cancel   your procedure and contact your primary care physician for an evaluation.  Remember:   Regular Business hours are:  Monday to Thursday 8:00 AM to 4:00 PM  Provider's Schedule: Delano Metz, MD:  Procedure days: Tuesday and Thursday 7:30 AM to 4:00 PM  Edward Jolly, MD:  Procedure days: Monday and Wednesday 7:30 AM to 4:00 PM ____________________________________________________________________________________________  Selective Nerve Root Block Patient Information  Description: Specific nerve roots exit the spinal canal and these nerves can be compressed and inflamed by a bulging disc and bone spurs.  By injecting steroids on the nerve root, we can potentially decrease the inflammation surrounding these nerves, which often leads to decreased pain.  Also, by injecting local anesthesia on the nerve root, this can provide [place] helpful information to give to your referring doctor if it decreases your pain.  Selective nerve root blocks can be done along the spine from the neck to the low back depending on the location of your pain.   After numbing the skin with local anesthesia, a small needle is passed to the nerve root and the position of the needle is verified using x-ray pictures.  After the needle is in correct position, we then deposit the medication.  You may experience a pressure sensation while this is being done.  The entire block usually lasts less than 15 minutes.  Conditions that may be treated with selective nerve root blocks:  Low back and leg pain  Spinal stenosis  Diagnostic block prior to potential surgery  Neck and arm pain  Post laminectomy syndrome  Preparation for the injection:  1. Do not eat any solid food or dairy products within 8 hours of your appointment. 2. You may drink clear liquids up to 3 hours before an appointment.  Clear liquids include water, black coffee, juice or soda.  No milk or cream please. 3. You may take your regular medications, including pain medications, with a sip of water before your appointment.  Diabetics should hold  regular insulin (if taken separately) and take 1/2 normal NPH dose the morning of the procedure.  Carry some sugar containing items with you to your appointment. 4. A driver must accompany you and be prepared to drive you home after your procedure. 5. Bring all your current medications with you. 6. An IV may be inserted and sedation may be given at the discretion of the physician. 7. A blood pressure cuff, EKG, and other monitors will often be applied during the procedure.  Some patients may need to have extra oxygen administered for a short period. 8. You will be asked to provide medical information, including allergies, prior to the procedure.  We must know immediately if you are taking blood  Thinners (like Coumadin) or if you are allergic to IV iodine contrast (dye).  Possible side-effects: All are usually temporary  Bleeding from needle site  Light headedness  Numbness and tingling  Decreased blood pressure  Weakness in arms/legs  Pressure sensation in back/neck  Pain at injection site (several days)  Possible complications: All are extremely rare  Infection  Nerve injury  Spinal headache (a headache wore with upright position)  Call if you experience:  Fever/chills associated with headache or increased back/neck pain  Headache worsened by an upright position  New onset weakness or numbness of an extremity below the injection site  Hives or difficulty breathing (go to the emergency room)  Inflammation or drainage at the injection site(s)  Severe back/neck pain greater than usual  New symptoms  which are concerning to you  Please note:  Although the local anesthetic injected can often make your back or neck feel good for several hours after the injection the pain will likely return.  It takes 3-5 days for steroids to work on the nerve root. You may not notice any pain relief for at least one week.  If effective, we will often do a series of 3 injections  spaced 3-6 weeks apart to maximally decrease your pain.    If you have any questions, please call [phone] [facility]

## 2018-05-27 NOTE — Progress Notes (Addendum)
Patient's Name: Isaac Meza  MRN: [id number]  Referring Provider: Olean Ree, MD  DOB: 1970/05/03  PCP: Patient, No Pcp Per  DOS: 05/27/2018  Note by: Gillis Santa, MD  Service setting: Ambulatory outpatient  Specialty: Interventional Pain Management  Location: [facility] (AMB) Pain Management Facility  Visit type: Initial Patient Evaluation  Patient type: New Patient   Primary Reason(s) for Visit: Encounter for initial evaluation of one or more chronic problems (new to examiner) potentially causing chronic pain, and posing a threat to normal musculoskeletal function. (Level of risk: High) CC: Back Pain (mid to upper) and Neck Pain  HPI  Isaac Meza is a 48 y.o. year old, male patient, who comes today to see [place] for the first time for an initial evaluation of his chronic pain. He has Bilateral thoracic back pain on their problem list. Today he comes in for evaluation of his Back Pain (mid to upper) and Neck Pain  Pain Assessment: Location: Right, Left, Mid, Upper Back Radiating: to neck and "sometimes around to ribcage" Onset: More than a month ago Duration: Chronic pain Quality: Aching, Sharp(Aching pain all the time, sharp when working) Severity: 7 /10 (subjective, self-reported pain score)  Note: Reported level is inconsistent with clinical observations.                         When using our objective Pain Scale, levels between 6 and 10/10 are said to belong in an emergency room, as it progressively worsens from a 6/10, described as severely limiting, requiring emergency care not usually available at an outpatient pain management facility. At a 6/10 level, communication becomes difficult and requires great effort. Assistance to reach the emergency department may be required. Facial flushing and profuse sweating along with potentially dangerous increases in heart rate and blood pressure will be evident. Effect on ADL: limits ability to work Timing: Constant Modifying factors: Aleve  BP: (!)  189/120  HR: 92  Onset and Duration: Present longer than 3 months Cause of pain: fall Severity: Getting worse, NAS-11 at its worse: 10/10, NAS-11 at its best: 7/10 and NAS-11 now: 7/10 Timing: Not influenced by the time of the day Aggravating Factors: Bending, Climbing, Intercourse (sex), Kneeling, Lifiting, Motion, Prolonged standing, Squatting and Twisting Alleviating Factors: not noted Associated Problems: Fatigue Quality of Pain: Aching, Agonizing, Annoying and Constant Previous Examinations or Tests: X-rays Previous Treatments: The patient denies none noted  The patient comes into the clinics today for the first time for a chronic pain management evaluation.   48 year old male who presents with a chief complaint of left rib pain and right thoracic paraspinal pain.  Patient has a history of multiple rib fractures from a previous injury in 2019; fall [place] a [place] in [place] in May 2019 which resulted in bilateral multiple rib fractures, T6 compression fracture, testicular and perirectal injury.  Patient also had skin trauma to his right buttock region lateral to the anal verge.  Patient has had ED visits in the past with pain issues from his perianal wound.  His primary pain is is persistent thoracic and lateral rib pain..  This was aggravated after an incident when he was working underneath a sink a couple of weeks ago.  Patient states that he needs to go back to work, works in [employer] however his pain is limiting his ability to work.  Today I took the time to provide the patient with information regarding my pain practice. The patient was informed that my  practice is divided into two sections: an interventional pain management section, as well as a completely separate and distinct medication management section. I explained that I have procedure days for my interventional therapies, and evaluation days for follow-ups and medication management. Because of the amount of documentation  required during both, they are kept separated. This means that there is the possibility that he may be scheduled for a procedure on one day, and medication management the next. I have also informed him that because of staffing and facility limitations, I no longer take patients for medication management only. To illustrate the reasons for this, I gave the patient the example of surgeons, and how inappropriate it would be to refer a patient to his/her care, just to write for the post-surgical antibiotics on a surgery done by a different surgeon.   Because interventional pain management is my board-certified specialty, the patient was informed that joining my practice means that they are open to any and all interventional therapies. I made it clear that this does not mean that they will be forced to have any procedures done. What this means is that I believe interventional therapies to be essential part of the diagnosis and proper management of chronic pain conditions. Therefore, patients not interested in these interventional alternatives will be better served under the care of a different practitioner.  The patient was also made aware of my Comprehensive Pain Management Safety Guidelines where by joining my practice, they limit all of their nerve blocks and joint injections to those done by our practice, for as long as we are retained to manage their care.   Historic Controlled Substance Pharmacotherapy Review   05/13/2018  1   05/13/2018  Oxycodon-Acetaminophen 7.5-325  12.00 3 Ro Smi   [id number]   [employer] (1123)   0  45.00 MME  Private Pay   [place]     Pharmacodynamics: Desired effects: Analgesia: The patient reports >50% benefit. Reported improvement in function: The patient reports medication allows him to accomplish basic ADLs. Clinically meaningful improvement in function (CMIF): Sustained CMIF goals met Perceived effectiveness: Described as relatively effective, allowing for increase in activities of  daily living (ADL) Undesirable effects: Side-effects or Adverse reactions: None reported Historical Monitoring: The patient  reports no history of drug use. List of all UDS Test(s): No results found for: MDMA, COCAINSCRNUR, [place], [place], CANNABQUANT, [place], [place] List of other Serum/Urine Drug Screening Test(s):  No results found for: AMPHSCRSER, BARBSCRSER, BENZOSCRSER, COCAINSCRSER, COCAINSCRNUR, PCPSCRSER, PCPQUANT, THCSCRSER, THCU, CANNABQUANT, OPIATESCRSER, OXYSCRSER, PROPOXSCRSER, ETH Historical Background Evaluation: [place] PMP: Six (6) year initial data search conducted.             [place] Department of public safety, offender search: Editor, commissioning Information) Non-contributory Risk Assessment Profile: Aberrant behavior: None observed or detected today Risk factors for fatal opioid overdose: age 13-70 years old Fatal overdose hazard ratio (HR): Calculation deferred Non-fatal overdose hazard ratio (HR): Calculation deferred Risk of opioid abuse or dependence: 0.7-3.0% with doses ? 36 MME/day and 6.1-26% with doses ? 120 MME/day. Substance use disorder (SUD) risk level: See below Personal History of Substance Abuse (SUD-Substance use disorder):  Alcohol: Negative  Illegal Drugs: Negative  Rx Drugs: Negative  ORT Risk Level calculation: Low Risk Opioid Risk Tool - 05/27/18 0813      Family History of Substance Abuse   Alcohol  Negative    Illegal Drugs  Negative    Rx Drugs  Negative      Personal History of Substance Abuse  Alcohol  Negative    Illegal Drugs  Negative    Rx Drugs  Negative      Age   Age between 77-45 years   Yes      History of Preadolescent Sexual Abuse   History of Preadolescent Sexual Abuse  Negative or Male      Psychological Disease   Psychological Disease  Negative    Depression  Negative      Total Score   Opioid Risk Tool Scoring  1    Opioid Risk Interpretation  Low Risk      ORT Scoring interpretation table:  Score <3 = Low Risk for SUD   Score between 4-7 = Moderate Risk for SUD  Score >8 = High Risk for Opioid Abuse   PHQ-2 Depression Scale:  Total score: 0  PHQ-2 Scoring interpretation table: (Score and probability of major depressive disorder)  Score 0 = No depression  Score 1 = 15.4% Probability  Score 2 = 21.1% Probability  Score 3 = 38.4% Probability  Score 4 = 45.5% Probability  Score 5 = 56.4% Probability  Score 6 = 78.6% Probability   PHQ-9 Depression Scale:  Total score: 0  PHQ-9 Scoring interpretation table:  Score 0-4 = No depression  Score 5-9 = Mild depression  Score 10-14 = Moderate depression  Score 15-19 = Moderately severe depression  Score 20-27 = Severe depression (2.4 times higher risk of SUD and 2.89 times higher risk of overuse)   Pharmacologic Plan: Non-opioid analgesic therapy offered.            Initial impression: Pending review of available data and ordered tests.  Meds   Current Outpatient Medications:  .  acetaminophen (TYLENOL) 500 MG tablet, Take 500 mg by mouth every 6 (six) hours as needed., Disp: , Rfl:  .  IBUPROFEN PO, Take by mouth., Disp: , Rfl:  .  naproxen sodium (ALEVE) 220 MG tablet, Take 220 mg by mouth 4 (four) times daily., Disp: , Rfl:  .  oxyCODONE-acetaminophen (PERCOCET) 7.5-325 MG tablet, Take 1 tablet by mouth every 6 (six) hours as needed for severe pain., Disp: 12 tablet, Rfl: 0 .  tiZANidine (ZANAFLEX) 4 MG tablet, Take 1 tablet (4 mg total) by mouth 2 (two) times daily as needed for muscle spasms., Disp: 60 tablet, Rfl: 1  Imaging Review   CLINICAL DATA:  Bad fall in May of last year breaking a couple ribs on LEFT side, had lung surgery, acute onset of pain yesterday while working under a sink question re-injury  EXAM: LEFT RIBS AND CHEST - 3+ VIEW  COMPARISON:  04/30/2018 chest radiograph  FINDINGS: Normal heart size, mediastinal contours, and pulmonary vascularity.  Minimal bibasilar atelectasis and central peribronchial  thickening.  Lungs otherwise clear.  No infiltrate, pleural effusion, or pneumothorax.  Multiple old LEFT rib fractures fourth through tenth.  Old fractures of the posterolateral RIGHT second third fourth and fifth ribs.  BB placed at site of symptoms anterior lower LEFT chest.  No acute rib fracture or bone destruction.  IMPRESSION: Minimal bibasilar atelectasis and central bronchitic changes.  Multiple old BILATERAL rib fractures.  No acute abnormalities.   Complexity Note: Imaging results reviewed. Results shared with Mr. Vallecillo, using Layman's terms.                         ROS  Cardiovascular: No reported cardiovascular signs or symptoms such as High blood pressure, coronary artery disease, abnormal heart rate  or rhythm, heart attack, blood thinner therapy or heart weakness and/or failure Pulmonary or Respiratory: Wheezing and difficulty taking a deep full breath (Asthma) Neurological: No reported neurological signs or symptoms such as seizures, abnormal skin sensations, urinary and/or fecal incontinence, being born with an abnormal open spine and/or a tethered spinal cord Review of Past Neurological Studies: No results found for this or any previous visit. Psychological-Psychiatric: No reported psychological or psychiatric signs or symptoms such as difficulty sleeping, anxiety, depression, delusions or hallucinations (schizophrenial), mood swings (bipolar disorders) or suicidal ideations or attempts Gastrointestinal: No reported gastrointestinal signs or symptoms such as vomiting or evacuating blood, reflux, heartburn, alternating episodes of diarrhea and constipation, inflamed or scarred liver, or pancreas or irrregular and/or infrequent bowel movements Genitourinary: No reported renal or genitourinary signs or symptoms such as difficulty voiding or producing urine, peeing blood, non-functioning kidney, kidney stones, difficulty emptying the bladder, difficulty  controlling the flow of urine, or chronic kidney disease Hematological: No reported hematological signs or symptoms such as prolonged bleeding, low or poor functioning platelets, bruising or bleeding easily, hereditary bleeding problems, low energy levels due to low hemoglobin or being anemic Endocrine: No reported endocrine signs or symptoms such as high or low blood sugar, rapid heart rate due to high thyroid levels, obesity or weight gain due to slow thyroid or thyroid disease Rheumatologic: No reported rheumatological signs and symptoms such as fatigue, joint pain, tenderness, swelling, redness, heat, stiffness, decreased range of motion, with or without associated rash Musculoskeletal: Negative for myasthenia gravis, muscular dystrophy, multiple sclerosis or malignant hyperthermia Work History: Working full time  Allergies  Mr. Couser has No Known Allergies.  [place]  Drug: Mr. Gieske  reports no history of drug use. Alcohol:  reports current alcohol use. Tobacco:  reports that he has been smoking. He has been smoking about 1.00 pack per day. He has never used smokeless tobacco. Medical:  has a past medical history of Asthma and Hypertension. Family: family history is not on file.  Past Surgical History:  Procedure Laterality Date  . FRACTURE SURGERY Right 2007   arm   Active Ambulatory Problems    Diagnosis Date Noted  . Bilateral thoracic back pain 10/13/2017   Resolved Ambulatory Problems    Diagnosis Date Noted  . No Resolved Ambulatory Problems   Past Medical History:  Diagnosis Date  . Asthma   . Hypertension    Constitutional Exam  General appearance: Well nourished, well developed, and well hydrated. In no apparent acute distress Vitals:   05/27/18 0801  BP: (!) 189/120  Pulse: 92  Resp: 18  Temp: 98.2 F (36.8 C)  TempSrc: Oral  SpO2: 98%  Weight: 143 lb 6.4 oz (65 kg)  Height: '5\' 9"'$  (1.753 m)   BMI Assessment: Estimated body mass index is 21.18 kg/m  as calculated from the following:   Height as of this encounter: '5\' 9"'$  (1.753 m).   Weight as of this encounter: 143 lb 6.4 oz (65 kg).  BMI interpretation table: BMI level Category Range association with higher incidence of chronic pain  <18 kg/m2 Underweight   18.5-24.9 kg/m2 Ideal body weight   25-29.9 kg/m2 Overweight Increased incidence by 20%  30-34.9 kg/m2 Obese (Class I) Increased incidence by 68%  35-39.9 kg/m2 Severe obesity (Class II) Increased incidence by 136%  >40 kg/m2 Extreme obesity (Class III) Increased incidence by 254%   Patient's current BMI Ideal Body weight  Body mass index is 21.18 kg/m. Ideal body weight: 70.7 kg (155  lb 13.8 oz)   BMI Readings from Last 4 Encounters:  05/27/18 21.18 kg/m  05/13/18 22.59 kg/m  04/30/18 20.85 kg/m  04/20/18 21.74 kg/m   Wt Readings from Last 4 Encounters:  05/27/18 143 lb 6.4 oz (65 kg)  05/13/18 153 lb (69.4 kg)  04/30/18 141 lb 3.2 oz (64 kg)  04/20/18 143 lb (64.9 kg)  Psych/Mental status: Alert, oriented x 3 (person, place, & time)       Eyes: PERLA Respiratory: No evidence of acute respiratory distress  Cervical Spine Area Exam  Skin & Axial Inspection: No masses, redness, edema, swelling, or associated skin lesions Alignment: Symmetrical Functional ROM: Decreased ROM      Stability: No instability detected Muscle Tone/Strength: Functionally intact. No obvious neuro-muscular anomalies detected. Sensory (Neurological): Unimpaired Palpation: No palpable anomalies              Upper Extremity (UE) Exam    Side: Right upper extremity  Side: Left upper extremity  Skin & Extremity Inspection: Skin color, temperature, and hair growth are WNL. No peripheral edema or cyanosis. No masses, redness, swelling, asymmetry, or associated skin lesions. No contractures.  Skin & Extremity Inspection: Skin color, temperature, and hair growth are WNL. No peripheral edema or cyanosis. No masses, redness, swelling, asymmetry, or  associated skin lesions. No contractures.  Functional ROM: Unrestricted ROM          Functional ROM: Unrestricted ROM          Muscle Tone/Strength: Functionally intact. No obvious neuro-muscular anomalies detected.  Muscle Tone/Strength: Functionally intact. No obvious neuro-muscular anomalies detected.  Sensory (Neurological): Unimpaired          Sensory (Neurological): Unimpaired          Palpation: No palpable anomalies              Palpation: No palpable anomalies              Provocative Test(s):  Phalen's test: deferred Tinel's test: deferred Apley's scratch test (touch opposite shoulder):  Action 1 (Across chest): Decreased ROM Action 2 (Overhead): Decreased ROM Action 3 (LB reach): Decreased ROM   Provocative Test(s):  Phalen's test: deferred Tinel's test: deferred Apley's scratch test (touch opposite shoulder):  Action 1 (Across chest): Decreased ROM Action 2 (Overhead): Decreased ROM Action 3 (LB reach): Decreased ROM    Thoracic Spine Area Exam  Skin & Axial Inspection: No masses, redness, or swelling Alignment: Asymmetric Functional ROM: Diminished ROM Stability: No instability detected Muscle Tone/Strength: Functionally intact. No obvious neuro-muscular anomalies detected. Sensory (Neurological): Musculoskeletal pain pattern L paraspinal region Muscle strength & Tone: No palpable anomalies  Lumbar Spine Area Exam  Skin & Axial Inspection: No masses, redness, or swelling Alignment: Asymmetric Functional ROM: Pain restricted ROM affecting both sides Stability: No instability detected Muscle Tone/Strength: Functionally intact. No obvious neuro-muscular anomalies detected. Sensory (Neurological): Unimpaired Palpation: No palpable anomalies       Provocative Tests: Hyperextension/rotation test: deferred today       Lumbar quadrant test (Kemp's test): deferred today       Lateral bending test: deferred today       Patrick's Maneuver: deferred today                    FABER* test: deferred today                   S-I anterior distraction/compression test: deferred today         S-I lateral compression  test: deferred today         S-I Thigh-thrust test: deferred today         S-I Gaenslen's test: deferred today         *(Flexion, ABduction and External Rotation)  Gait & Posture Assessment  Ambulation: Unassisted Gait: Relatively normal for age and body habitus Posture: WNL   Lower Extremity Exam    Side: Right lower extremity  Side: Left lower extremity  Stability: No instability observed          Stability: No instability observed          Skin & Extremity Inspection: Skin color, temperature, and hair growth are WNL. No peripheral edema or cyanosis. No masses, redness, swelling, asymmetry, or associated skin lesions. No contractures.  Skin & Extremity Inspection: Skin color, temperature, and hair growth are WNL. No peripheral edema or cyanosis. No masses, redness, swelling, asymmetry, or associated skin lesions. No contractures.  Functional ROM: Unrestricted ROM                  Functional ROM: Unrestricted ROM                  Muscle Tone/Strength: Functionally intact. No obvious neuro-muscular anomalies detected.  Muscle Tone/Strength: Functionally intact. No obvious neuro-muscular anomalies detected.  Sensory (Neurological): Unimpaired        Sensory (Neurological): Unimpaired        DTR: Patellar: deferred today Achilles: deferred today Plantar: deferred today  DTR: Patellar: deferred today Achilles: deferred today Plantar: deferred today  Palpation: No palpable anomalies  Palpation: No palpable anomalies   Assessment  Primary Diagnosis & Pertinent Problem List: The primary encounter diagnosis was Multiple fractures of ribs, bilateral, sequela. Diagnoses of Compression fracture of T6 vertebra, sequela, Chronic bilateral thoracic back pain, Intercostal neuralgia (left), Fall from cliff, sequela, and Chronic pain syndrome were also pertinent to  this visit.  Visit Diagnosis (New problems to examiner): 1. Multiple fractures of ribs, bilateral, sequela   2. Compression fracture of T6 vertebra, sequela   3. Chronic bilateral thoracic back pain   4. Intercostal neuralgia (left)   5. Fall from cliff, sequela   6. Chronic pain syndrome    48 year old male presents with left rib pain as well as right greater than left paraspinal thoracic back pain which started after a mountain fall that he sustained.  Reviewed patient's x-rays.  He states that his thoracic pain is worse than his left sided rib pain.  He states that his left-sided rib pain is aggravated with certain positions.  In regards to his thoracic pain, this is likely related to his T6 compression fracture.  I recommended the patient consider surgical evaluation for kyphoplasty.  Not only would this help reinforce structural integrity of his collapsed vertebral body but could also help decrease his pain levels.  I will refer the patient to orthopedics for evaluation of T6 kyphoplasty as potential therapeutic option for compression fracture  In regards to his left rib pain, we discussed left-sided intercostal nerve block for intercostal neuralgia as well as left intercostal radiofrequency ablation, pulsed.  Risks and benefits of this procedure were discussed.  The patient states that he wanted to hold off on this procedure until he has a pain flare.  I will place a PRN order for this.  In regards to medication management, I expressed that I wanted to avoid opioid medications as a part of his medication regimen.  I believe that addressing his compression fracture via  kyphoplasty and assessing his intercostal neuralgia via diagnostic intercostal nerve blocks and possible pulsed radiofrequency ablation will be a better therapeutic modality and chronic opioid therapy.  If patient would like to be considered for chronic opioid therapy, he will need psychiatric consult to determine risk and if  appropriate, can consider low-dose tramadol.  Can consider muscle relaxant with tizanidine as below.  Patient has tried Robaxin in the past without any benefit.  I will also refer the patient to physical therapy to help with strength and stretching exercises to maintain posture and alignment in the context of multiple chronic bilateral rib fractures as well as T6 compression fracture.   Plan of Care (Initial workup plan)  Ordered Lab-work, Procedure(s), Referral(s), & Consult(s): Orders Placed This Encounter  Procedures  . INTERCOSTAL NERVE BLOCK  . Compliance Drug Analysis, Ur  . Ambulatory referral to Orthopedic Surgery  . Ambulatory referral to Physical Therapy   Pharmacotherapy (current): Medications ordered:  Meds ordered this encounter  Medications  . tiZANidine (ZANAFLEX) 4 MG tablet    Sig: Take 1 tablet (4 mg total) by mouth 2 (two) times daily as needed for muscle spasms.    Dispense:  60 tablet    Refill:  1   Medications administered during this visit: Jacqualine Code had no medications administered during this visit.   Pharmacological management options:  Opioid Analgesics: Try and avoid, however if pain continues to worsen, will need psych consult prior to consider low dose Tramadol.  Membrane stabilizer: To be determined at a later time, consider Gabapentin, Lyrica, Cymbalta  Muscle relaxant: Tried and failed robaxin. trial of Tizanidine, Can consider Balcofen in future  NSAID: To be determined at a later time  Other analgesic(s): To be determined at a later time, lidocaine patches overlying rib, compounded cream   Interventional management options: Mr. Levinson was informed that there is no guarantee that he would be a candidate for interventional therapies. The decision will be based on the results of diagnostic studies, as well as Mr. Rohrer's risk profile.  Procedure(s) under consideration:  Left intercostal nerve block  Left intercostal pulsed RFA Right  thoracic facet injection   Provider-requested follow-up: Return in about 8 weeks (around 07/22/2018) for MM with Crystal.  Future Appointments  Date Time Provider [place]  07/15/2018  8:00 AM Vevelyn Francois, NP [facility] None    Primary Care Physician: Patient, No Pcp Per Location: [facility] Note by: Gillis Santa, M.D, Date: 05/27/2018; Time: 10:56 AM  Patient Instructions  ____________________________________________________________________________________________  Preparing for Procedure with Sedation  Instructions: . Oral Intake: Do not eat or drink anything for at least 8 hours prior to your procedure. . Transportation: Public transportation is not allowed. Bring an adult driver. The driver must be physically present in our waiting room before any procedure can be started. Marland Kitchen Physical Assistance: Bring an adult physically capable of assisting you, in the event you need help. This adult should keep you company at home for at least 6 hours after the procedure. . Blood Pressure Medicine: Take your blood pressure medicine with a sip of water the morning of the procedure. . Blood thinners: Notify our staff if you are taking any blood thinners. Depending on which one you take, there will be specific instructions on how and when to stop it. . Diabetics on insulin: Notify the staff so that you can be scheduled 1st case in the morning. If your diabetes requires high dose insulin, take only  of your  normal insulin dose the morning of the procedure and notify the staff that you have done so. . Preventing infections: Shower with an antibacterial soap the morning of your procedure. . Build-up your immune system: Take 1000 mg of Vitamin C with every meal (3 times a day) the day prior to your procedure. Marland Kitchen Antibiotics: Inform the staff if you have a condition or reason that requires you to take antibiotics before dental procedures. . Pregnancy: If you are  pregnant, call and cancel the procedure. . Sickness: If you have a cold, fever, or any active infections, call and cancel the procedure. . Arrival: You must be in the facility at least 30 minutes prior to your scheduled procedure. . Children: Do not bring children with you. . Dress appropriately: Bring dark clothing that you would not mind if they get stained. . Valuables: Do not bring any jewelry or valuables.  Procedure appointments are reserved for interventional treatments only. Marland Kitchen No Prescription Refills. . No medication changes will be discussed during procedure appointments. . No disability issues will be discussed.  Reasons to call and reschedule or cancel your procedure: (Following these recommendations will minimize the risk of a serious complication.) . Surgeries: Avoid having procedures within 2 weeks of any surgery. (Avoid for 2 weeks before or after any surgery). . Flu Shots: Avoid having procedures within 2 weeks of a flu shots or . (Avoid for 2 weeks before or after immunizations). . Barium: Avoid having a procedure within 7-10 days after having had a radiological study involving the use of radiological contrast. (Myelograms, Barium swallow or enema study). . Heart attacks: Avoid any elective procedures or surgeries for the initial 6 months after a "Myocardial Infarction" (Heart Attack). . Blood thinners: It is imperative that you stop these medications before procedures. Let us know if you if you take any blood thinner.  . Infection: Avoid procedures during or within two weeks of an infection (including chest colds or gastrointestinal problems). Symptoms associated with infections include: Localized redness, fever, chills, night sweats or profuse sweating, burning sensation when voiding, cough, congestion, stuffiness, runny nose, sore throat, diarrhea, nausea, vomiting, cold or Flu symptoms, recent or current infections. It is specially important if the infection is over the area  that we intend to treat. Marland Kitchen Heart and lung problems: Symptoms that may suggest an active cardiopulmonary problem include: cough, chest pain, breathing difficulties or shortness of breath, dizziness, ankle swelling, uncontrolled high or unusually low blood pressure, and/or palpitations. If you are experiencing any of these symptoms, cancel your procedure and contact your primary care physician for an evaluation.  Remember:  Regular Business hours are:  Monday to Thursday 8:00 AM to 4:00 PM  Provider's Schedule: Milinda Pointer, MD:  Procedure days: Tuesday and Thursday 7:30 AM to 4:00 PM  Gillis Santa, MD:  Procedure days: Monday and Wednesday 7:30 AM to 4:00 PM ____________________________________________________________________________________________  Selective Nerve Root Block Patient Information  Description: Specific nerve roots exit the spinal canal and these nerves can be compressed and inflamed by a bulging disc and bone spurs.  By injecting steroids on the nerve root, we can potentially decrease the inflammation surrounding these nerves, which often leads to decreased pain.  Also, by injecting local anesthesia on the nerve root, this can provide [place] helpful information to give to your referring doctor if it decreases your pain.  Selective nerve root blocks can be done along the spine from the neck to the low back depending on the location of your pain.  After numbing the skin with local anesthesia, a small needle is passed to the nerve root and the position of the needle is verified using x-ray pictures.  After the needle is in correct position, we then deposit the medication.  You may experience a pressure sensation while this is being done.  The entire block usually lasts less than 15 minutes.  Conditions that may be treated with selective nerve root blocks:  Low back and leg pain  Spinal stenosis  Diagnostic block prior to potential surgery  Neck and arm pain  Post  laminectomy syndrome  Preparation for the injection:  1. Do not eat any solid food or dairy products within 8 hours of your appointment. 2. You may drink clear liquids up to 3 hours before an appointment.  Clear liquids include water, black coffee, juice or soda.  No milk or cream please. 3. You may take your regular medications, including pain medications, with a sip of water before your appointment.  Diabetics should hold regular insulin (if taken separately) and take 1/2 normal NPH dose the morning of the procedure.  Carry some sugar containing items with you to your appointment. 4. A driver must accompany you and be prepared to drive you home after your procedure. 5. Bring all your current medications with you. 6. An IV may be inserted and sedation may be given at the discretion of the physician. 7. A blood pressure cuff, EKG, and other monitors will often be applied during the procedure.  Some patients may need to have extra oxygen administered for a short period. 8. You will be asked to provide medical information, including allergies, prior to the procedure.  We must know immediately if you are taking blood  Thinners (like Coumadin) or if you are allergic to IV iodine contrast (dye).  Possible side-effects: All are usually temporary  Bleeding from needle site  Light headedness  Numbness and tingling  Decreased blood pressure  Weakness in arms/legs  Pressure sensation in back/neck  Pain at injection site (several days)  Possible complications: All are extremely rare  Infection  Nerve injury  Spinal headache (a headache wore with upright position)  Call if you experience:  Fever/chills associated with headache or increased back/neck pain  Headache worsened by an upright position  New onset weakness or numbness of an extremity below the injection site  Hives or difficulty breathing (go to the emergency room)  Inflammation or drainage at the injection  site(s)  Severe back/neck pain greater than usual  New symptoms which are concerning to you  Please note:  Although the local anesthetic injected can often make your back or neck feel good for several hours after the injection the pain will likely return.  It takes 3-5 days for steroids to work on the nerve root. You may not notice any pain relief for at least one week.  If effective, we will often do a series of 3 injections spaced 3-6 weeks apart to maximally decrease your pain.    If you have any questions, please call [phone] [facility]

## 2018-05-31 ENCOUNTER — Telehealth: Payer: Self-pay | Admitting: *Deleted

## 2018-05-31 ENCOUNTER — Telehealth: Payer: Self-pay | Admitting: Student in an Organized Health Care Education/Training Program

## 2018-05-31 MED ORDER — BACLOFEN 10 MG PO TABS: 10.0000 mg | ORAL_TABLET | Freq: Two times a day (BID) | ORAL | 0 refills | Status: DC | PRN

## 2018-05-31 MED ORDER — MELOXICAM 15 MG PO TABS: 15.0000 mg | ORAL_TABLET | Freq: Every day | ORAL | 1 refills | Status: DC

## 2018-05-31 NOTE — Telephone Encounter (Signed)
Dr. Lateef,                Please advise. Thank you- Nazaret Chea 

## 2018-05-31 NOTE — Telephone Encounter (Signed)
Patient endorsing sedation with tizanidine.  Can consider an alternative muscle relaxant such as baclofen.  Furthermore, recommend patient discontinue ibuprofen and naproxen and try meloxicam 15 mg daily for the next month.  If this is helpful, can continue with [employer].  If it is not, can consider alternative options including Celebrex, diclofenac.

## 2018-05-31 NOTE — Telephone Encounter (Signed)
Called pateint and informed of the following after discussin with Dr. Cherylann Ratel:  1) discontinue Zanaflex.  2) start Baclofen and Mobic as directed  3) DO NOT TAKE ALEVE OR ANY OTHER ANTI-INFlaMMATORY WHILE TAKING MOBIC.     Patient understands all instructions. Will call for and problems or concerns.

## 2018-05-31 NOTE — Telephone Encounter (Signed)
Patient was returning call, doesn't know who called him. Wants to let Dr. 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 Please contact patient to discuss

## 2018-06-04 LAB — COMPLIANCE DRUG ANALYSIS, UR

## 2018-06-09 ENCOUNTER — Telehealth: Payer: Self-pay | Admitting: Student in an Organized Health Care Education/Training Program

## 2018-06-09 NOTE — Telephone Encounter (Signed)
Patient called stating the new meds are not helping his pain lvls go down. He states he does not have muscle spasms but does have back pain. Says he keeps getting medications for back spasms not back pain. Wants to let Dr. Cherylann Ratel know this. I did inform patient we would give msg to Dr. Cherylann Ratel when he comes back on Monday as he is the one to change what meds patient is getting. Patient is understanding.

## 2018-06-14 ENCOUNTER — Encounter: Payer: Self-pay | Admitting: Student in an Organized Health Care Education/Training Program

## 2018-06-14 NOTE — Progress Notes (Signed)
noted 

## 2018-06-16 NOTE — Telephone Encounter (Signed)
Pt called again today stating he hasn't received a call back yet and that he is still in pain and the medication isn't working.

## 2018-06-16 NOTE — Telephone Encounter (Signed)
Patient needs to make an appointment to discuss  Medications with Dr Cherylann Ratel.

## 2018-07-15 ENCOUNTER — Ambulatory Visit: Payer: Medicaid Other | Attending: Nurse Practitioner | Admitting: Nurse Practitioner

## 2018-07-15 ENCOUNTER — Other Ambulatory Visit: Payer: Self-pay

## 2018-07-15 DIAGNOSIS — G894 Chronic pain syndrome: Secondary | ICD-10-CM

## 2018-07-15 DIAGNOSIS — S22050S Wedge compression fracture of T5-T6 vertebra, sequela: Secondary | ICD-10-CM

## 2018-07-15 DIAGNOSIS — S2243XS Multiple fractures of ribs, bilateral, sequela: Secondary | ICD-10-CM

## 2018-07-15 DIAGNOSIS — M546 Pain in thoracic spine: Secondary | ICD-10-CM

## 2018-07-15 DIAGNOSIS — G588 Other specified mononeuropathies: Secondary | ICD-10-CM

## 2018-07-15 DIAGNOSIS — G8929 Other chronic pain: Secondary | ICD-10-CM

## 2018-07-15 NOTE — Progress Notes (Signed)
Pain Management Encounter Note - Virtual Visit via Telephone Telehealth (real-time audio visits between healthcare provider and patient).  Patient's Phone No. & Preferred Pharmacy:  [phone] (home); There is no such number on file (mobile).; (Preferred) [phone]  [facility] [phone] [facility], [place] - [address] [place] [address] Phone: [phone] Fax: [phone]  [facility] [address] [place] - [address] [place] [address] Phone: [phone] Fax: [phone]  [id number] [place], [place] - [address] AT [facility] [address] [place] [address] Phone: [phone] Fax: [phone]   Pre-screening note:  Our staff contacted [name] and offered him an "in person", "face-to-face" appointment versus a telephone encounter. He indicated preferring the telephone encounter, at this time.  Reason for Virtual Visit: COVID-19*  Social distancing based on CDC and AMA recommendations.   I contacted [name] on 07/15/2018 at 8:14 AM by telephone and clearly identified myself as Thad Ranger, NP. I verified that I was speaking with the correct person using two identifiers (Name and date of birth: [birth year]).  Advanced Informed Consent I sought verbal advanced consent from [name] for telemedicine interactions and virtual visit. I informed Mr. Hershey of the security and privacy concerns, risks, and limitations associated with performing an evaluation and management service by telephone. I also informed Mr. Breighner of the availability of "in person" appointments and I informed him of the possibility of a patient responsible charge related to this service. Mr. Schlafer expressed understanding and agreed to proceed.   Historic Elements   [name] is a 48 y.o. year old, male patient evaluated today after his last encounter by our practice on  06/09/2018. [name]  has a past medical history of Asthma and Hypertension. He also  has a past surgical history that includes Fracture surgery (Right, 2007). [name] has a current medication list which includes the following prescription(s): acetaminophen, baclofen, meloxicam, oxycodone-acetaminophen, and tizanidine. He  reports that he has been smoking. He has been smoking about 1.00 pack per day. He has never used smokeless tobacco. He reports current alcohol use. He reports that he does not use drugs. [name] has No Known Allergies.   HPI  I last saw him on Visit date not found. He is being evaluated for medication management. He rates that his upper back pain 8/10. He admits that this was due to a previous fall. He does not feel like the pain moves at all. He does feel like is getting any pain relief with the Meloxicam or the baclofen. He admits that he gets just as much relief with Aleve and it is cheaper. He is not interested in continuing with pain management at this time. "He will continue to do what he has been doing".   Pharmacotherapy Assessment  Analgesic: None MME/day: 0 mg/day.   Monitoring: Pharmacotherapy: No side-effects or adverse reactions reported.  PMP: PDMP reviewed during this encounter.       Compliance: No problems identified. Plan: Refer to "POC".  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  Minimal  bibasilar atelectasis and central peribronchial thickening.  Lungs otherwise clear.  No infiltrate, pleural effusion, or pneumothorax.  Multiple old LEFT rib fractures fourth through tenth.  Old  fractures of the posterolateral RIGHT second third fourth and fifth ribs.  BB placed at site of symptoms anterior lower LEFT chest.  No acute rib fracture or bone destruction.  IMPRESSION: Minimal bibasilar atelectasis and central bronchitic changes.  Multiple old BILATERAL rib fractures.  No acute abnormalities.  Electronically Signed   By: Ulyses SouthwardMark  Boles M.D.   On: 05/13/2018 13:01   Office Visit on 05/27/2018  Component Date Value Ref Range Status  . Summary 05/31/2018 FINAL   Final   Comment: ==================================================================== TOXAS[facility]RE COMP DRUG ANALYSIS,UR ==================================================================== Test                             Result       Flag       Units Drug Present and Declared for Prescription Verification   Oxycodone                      71           EXPECTED   ng/mg creat   Oxymorphone                    220          EXPECTED   ng/mg creat   Noroxycodone                   401          EXPECTED   ng/mg creat   Noroxymorphone                 48           EXPECTED   ng/mg creat    Sources of oxycodone are scheduled prescription medications.    Oxymorphone, noroxycodone, and noroxymorphone are expected    metabolites of oxycodone. Oxymorphone is also available as a    scheduled prescription medication.   Acetaminophen                  PRESENT      EXPECTED Drug Present not Declared for Prescription Verification   Benzoylecgonine                183          UNEXPECTED ng/mg creat    Benzoyle                          cgonine is a metabolite of cocaine; its presence    indicates use of this drug.  Source is most commonly illicit, but    cocaine is present in some topical anesthetic solutions.   Alcohol, Ethyl                 0.079        UNEXPECTED g/dL    Sources of ethyl alcohol include alcoholic beverages or as a    fermentation product of glucose; glucose is present in this    specimen.  Interpret  result with caution, as the presence of    ethyl alcohol is likely due, at least in part, to fermentation of    glucose.   Naproxen  PRESENT      UNEXPECTED Drug Absent but Declared for Prescription Verification   Baclofen                       Not Detected UNEXPECTED   Tizanidine                     Not Detected UNEXPECTED    Tizanidine, as indicated in the declared medication list, is not    always detected even when used as directed. ==================================================================== Test                      Result    Flag   Units      Ref Rang                          e   Creatinine              169              mg/dL      >=59 ==================================================================== Declared Medications:  The flagging and interpretation on this report are based on the  following declared medications.  Unexpected results may arise from  inaccuracies in the declared medications.  **Note: The testing scope of this panel includes these medications:  Baclofen (Lioresal)  Oxycodone (Percocet)  **Note: The testing scope of this panel does not include small to  moderate amounts of these reported medications:  Acetaminophen (Percocet)  Acetaminophen (Tylenol)  Tizanidine (Zanaflex)  **Note: The testing scope of this panel does not include following  reported medications:  Meloxicam (Mobic) ==================================================================== For clinical consultation, please call [phone]. 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 Diagnoses of Multiple fractures of ribs, bilateral, sequela, Chronic pain syndrome, Compression fracture of T6 vertebra, sequela, and Chronic bilateral thoracic back pain were also pertinent to this visit.  Plan of Care  I am having [name] maintain his  oxyCODONE-acetaminophen, acetaminophen, tiZANidine, baclofen, and meloxicam.  Pharmacotherapy (Medications Ordered): No orders of the defined types were placed in this encounter.  Orders:  No orders of the defined types were placed in this encounter.  Follow-up plan:   Return in about 1 week (around 07/22/2018) for No follow up needed.   I discussed the assessment and treatment plan with the patient. The patient was provided an opportunity to ask questions and all were answered. The patient agreed with the plan and demonstrated an understanding of the instructions.  Patient advised to call back or seek an in-person evaluation if the symptoms or condition worsens.  Total duration of non-face-to-face encounter: 5 minutes.  Note by: Thad Ranger, NP Date: 07/15/2018; Time: 9:54 AM  Disclaimer:  * Given the special circumstances of the COVID-19 pandemic, the federal government has announced that the Office for Civil Rights (OCR) will exercise its enforcement discretion and will not impose penalties on physicians using telehealth in the event of noncompliance with regulatory requirements under the [employer] Portability and Accountability Act (HIPAA) in connection with the good faith provision of telehealth during the COVID-19 national public health emergency. (AMA)

## 2019-09-24 IMAGING — CR DG RIBS W/ CHEST 3+V*L*
1 series · 3 of 3 positions shown · non-contrast
Comparison: 04/30/2018 chest radiograph

CLINICAL DATA: Bad fall in [REDACTED] breaking a couple ribs
on LEFT side, had lung surgery, acute onset of pain yesterday while
working under a sink question re-injury

EXAM:
LEFT RIBS AND CHEST - 3+ VIEW

[Series 1: dg ribs unilateral w/chest left · 0.14mm/px · 3 of 3 slices shown]
[im 1/3]
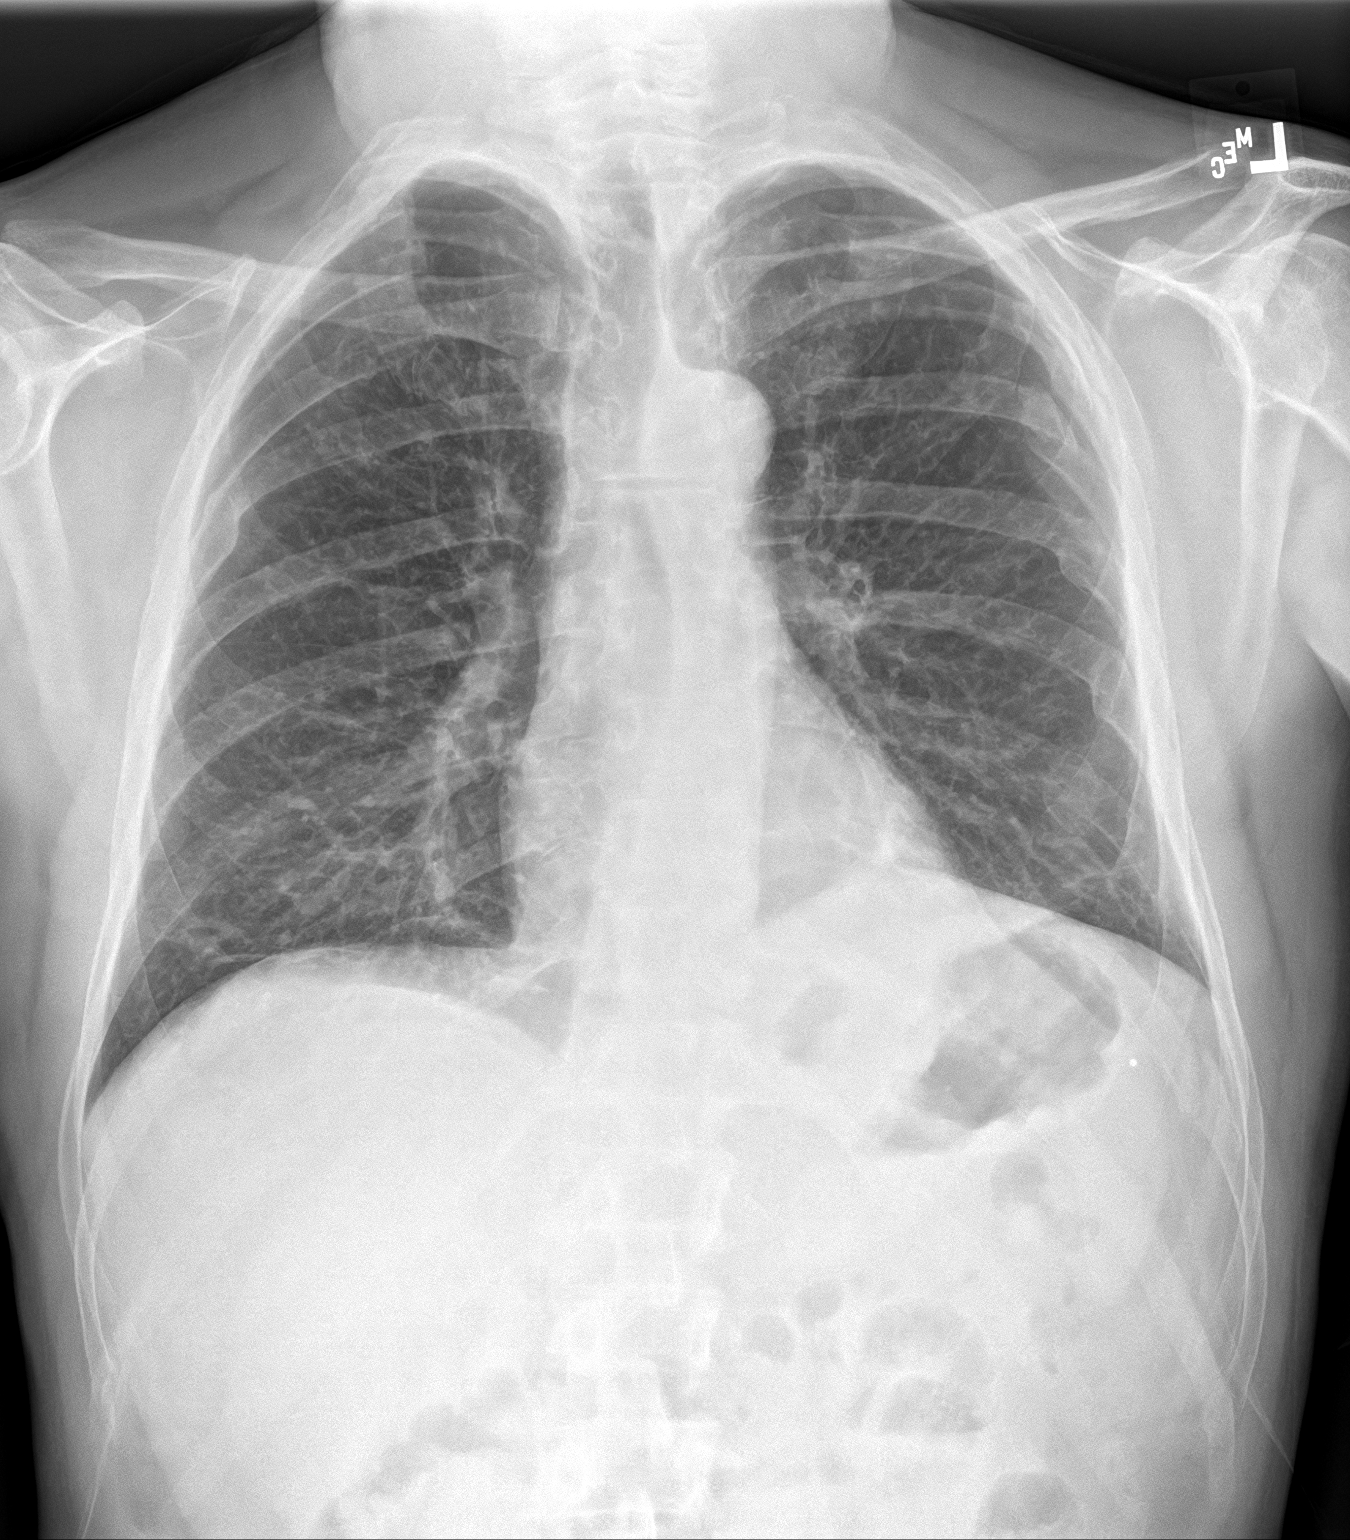
[im 2/3]
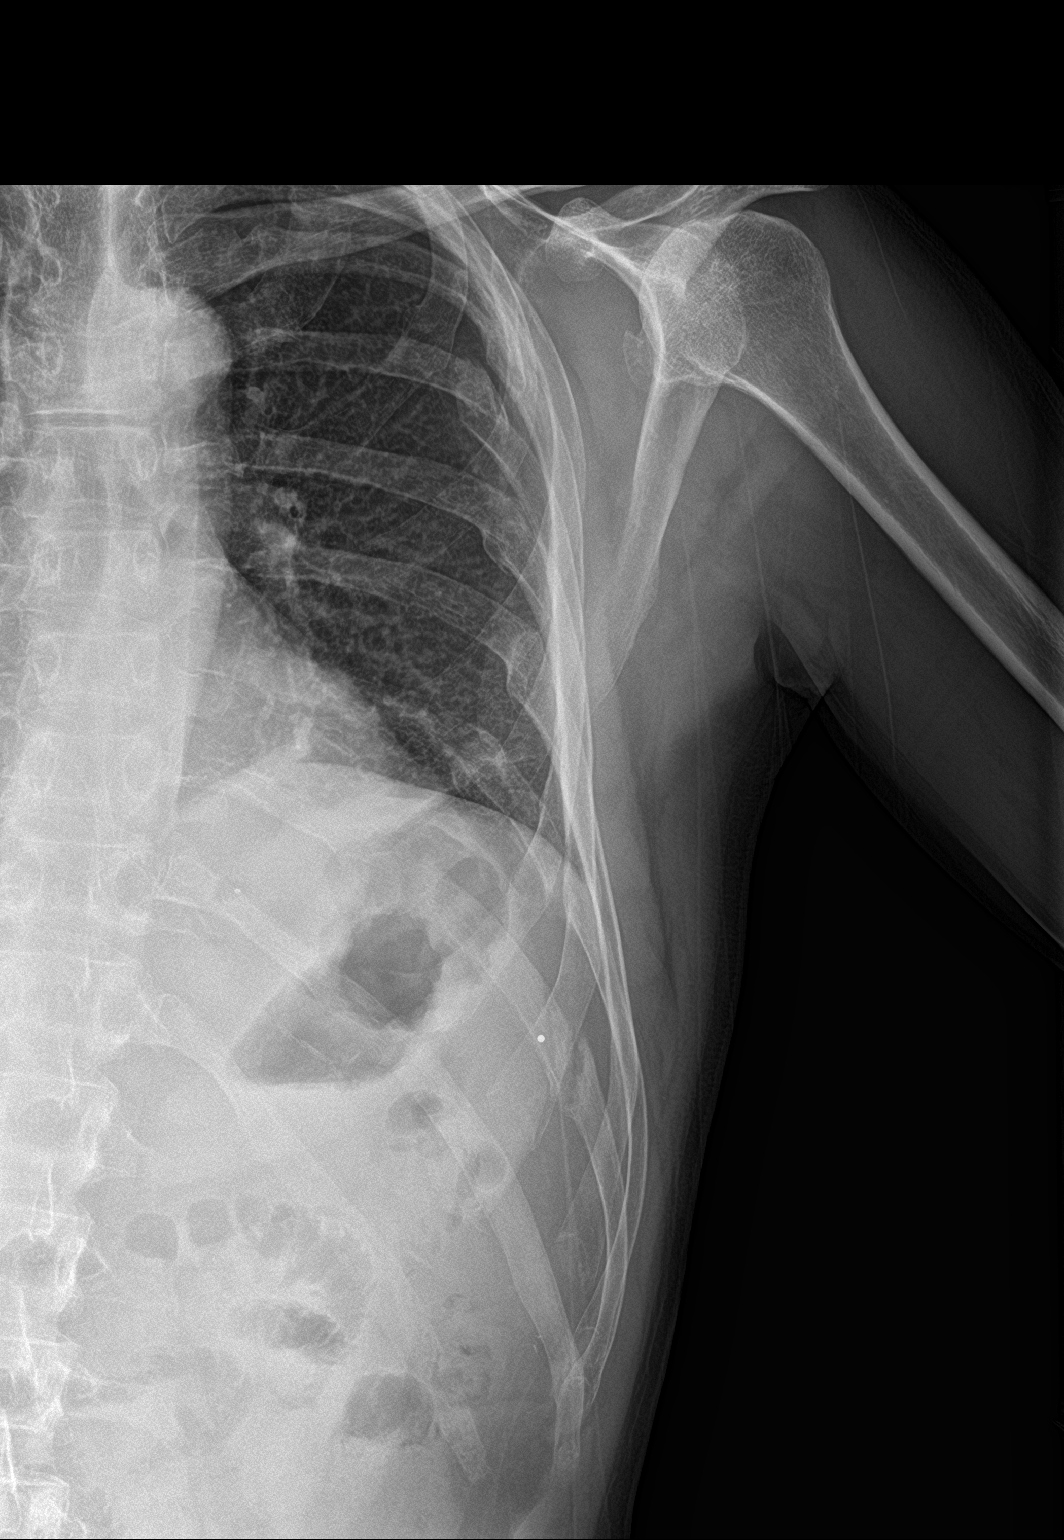
[im 3/3]
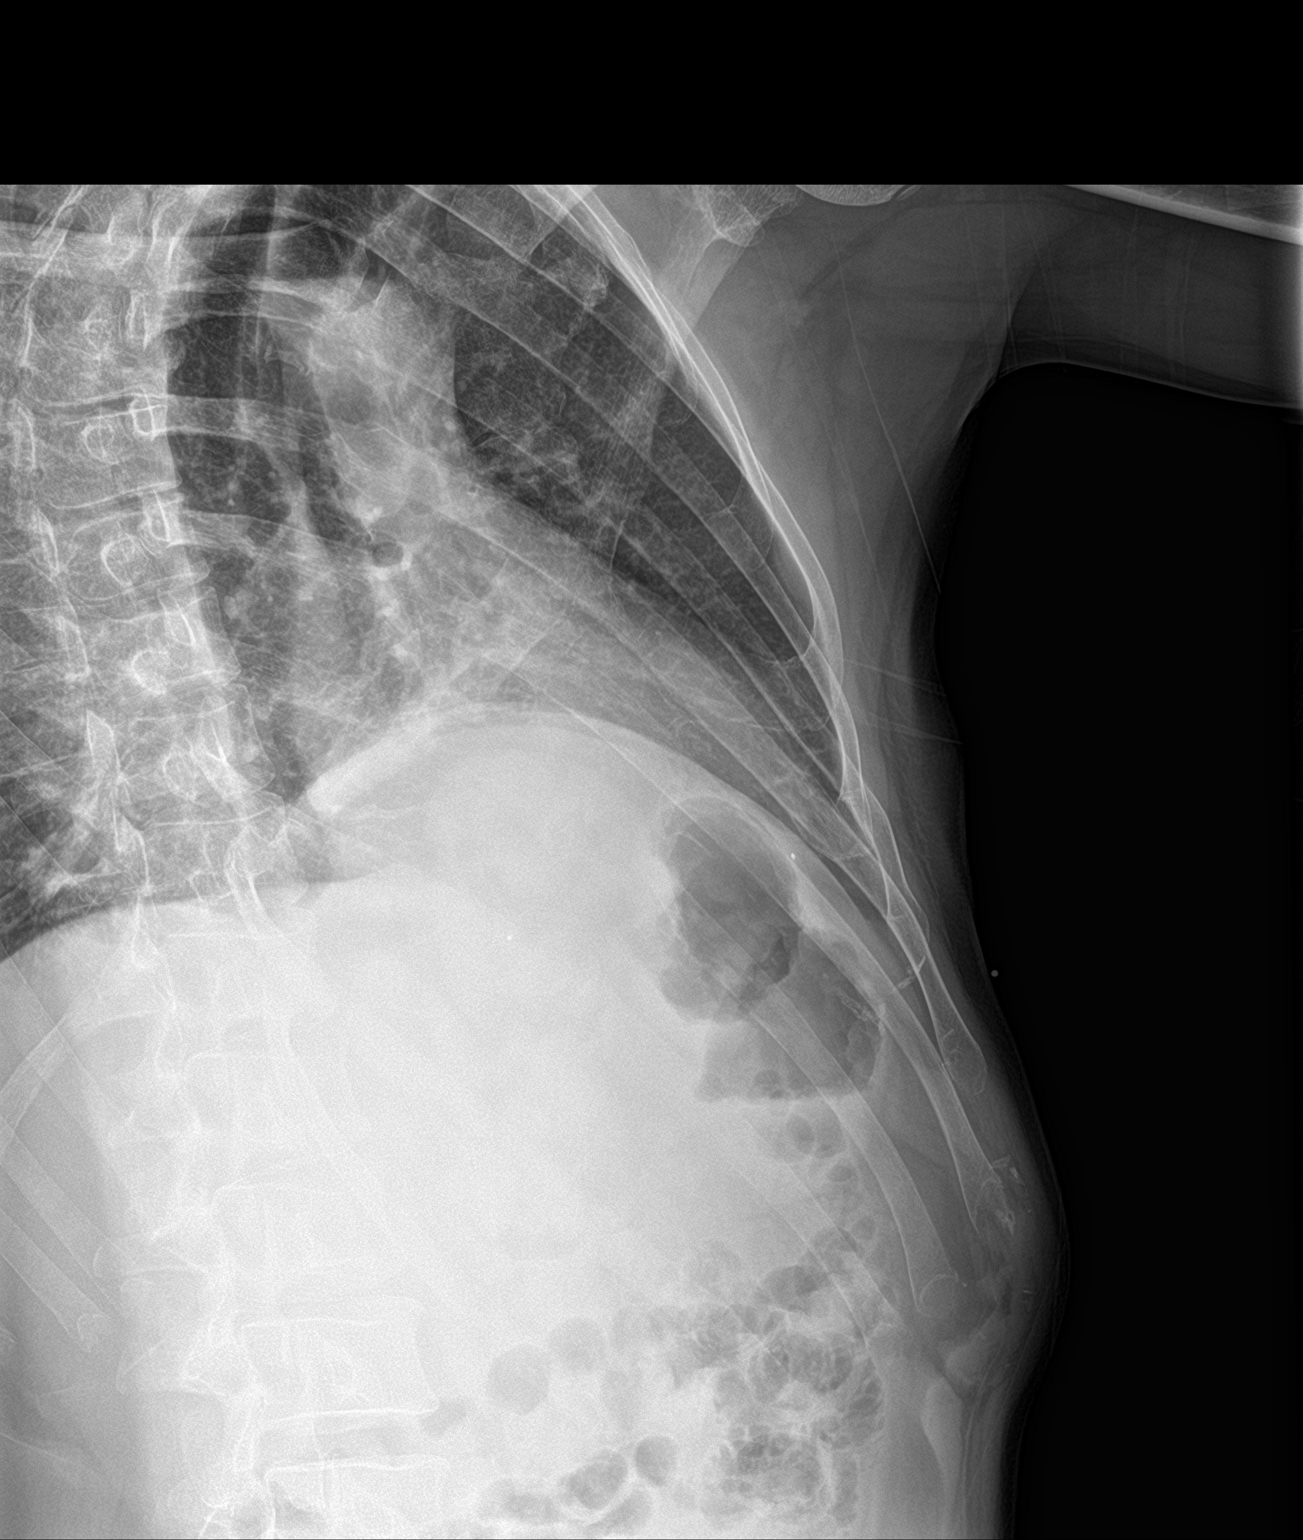

[3 of 3 positions shown; findings below may reference images not displayed]

FINDINGS: Normal heart size, mediastinal contours, and pulmonary vascularity.

Minimal bibasilar atelectasis and central peribronchial thickening.

Lungs otherwise clear.

No infiltrate, pleural effusion, or pneumothorax.

Multiple old LEFT rib fractures fourth through tenth.

Old fractures of the posterolateral RIGHT second third fourth and
fifth ribs.

BB placed at site of symptoms anterior lower LEFT chest.

No acute rib fracture or bone destruction.
IMPRESSION: Minimal bibasilar atelectasis and central bronchitic changes.

Multiple old BILATERAL rib fractures.

No acute abnormalities.

## 2019-11-30 ENCOUNTER — Other Ambulatory Visit: Payer: Self-pay

## 2019-11-30 ENCOUNTER — Encounter: Payer: Self-pay | Admitting: Emergency Medicine

## 2019-11-30 ENCOUNTER — Emergency Department
Admission: EM | Admit: 2019-11-30 | Discharge: 2019-11-30 | Disposition: A | Discharge status: Home / Self Care | Payer: Medicaid Other | Attending: Emergency Medicine | Admitting: Emergency Medicine

## 2019-11-30 ENCOUNTER — Emergency Department: Payer: Medicaid Other

## 2019-11-30 DIAGNOSIS — X58XXXA Exposure to other specified factors, initial encounter: Secondary | ICD-10-CM | POA: Insufficient documentation

## 2019-11-30 DIAGNOSIS — Y9389 Activity, other specified: Secondary | ICD-10-CM | POA: Insufficient documentation

## 2019-11-30 DIAGNOSIS — I10 Essential (primary) hypertension: Secondary | ICD-10-CM | POA: Insufficient documentation

## 2019-11-30 DIAGNOSIS — J45909 Unspecified asthma, uncomplicated: Secondary | ICD-10-CM | POA: Insufficient documentation

## 2019-11-30 DIAGNOSIS — Y9289 Other specified places as the place of occurrence of the external cause: Secondary | ICD-10-CM | POA: Insufficient documentation

## 2019-11-30 DIAGNOSIS — S20211A Contusion of right front wall of thorax, initial encounter: Secondary | ICD-10-CM

## 2019-11-30 DIAGNOSIS — Y998 Other external cause status: Secondary | ICD-10-CM | POA: Insufficient documentation

## 2019-11-30 DIAGNOSIS — F17210 Nicotine dependence, cigarettes, uncomplicated: Secondary | ICD-10-CM | POA: Insufficient documentation

## 2019-11-30 MED ORDER — TRAMADOL HCL 50 MG PO TABS
50.0000 mg | ORAL_TABLET | Freq: Once | ORAL | Status: AC
  Administered 2019-11-30: 50 mg via ORAL
  Filled 2019-11-30: qty 1

## 2019-11-30 MED ORDER — IBUPROFEN 600 MG PO TABS
600.0000 mg | ORAL_TABLET | Freq: Once | ORAL | Status: AC
  Administered 2019-11-30: 600 mg via ORAL
  Filled 2019-11-30: qty 1

## 2019-11-30 MED ORDER — LIDOCAINE 5 % EX PTCH
1.0000 | MEDICATED_PATCH | CUTANEOUS | Status: DC
  Administered 2019-11-30: 1 via TRANSDERMAL
  Filled 2019-11-30: qty 1

## 2019-11-30 MED ORDER — LIDOCAINE 5 % EX PTCH: 1.0000 | MEDICATED_PATCH | Freq: Two times a day (BID) | CUTANEOUS | 0 refills | Status: AC

## 2019-11-30 MED ORDER — IBUPROFEN 600 MG PO TABS: 600.0000 mg | ORAL_TABLET | Freq: Three times a day (TID) | ORAL | 0 refills | Status: DC | PRN

## 2019-11-30 MED ORDER — TRAMADOL HCL 50 MG PO TABS: 50.0000 mg | ORAL_TABLET | Freq: Four times a day (QID) | ORAL | 0 refills | Status: DC | PRN

## 2019-11-30 NOTE — ED Provider Notes (Signed)
[facility] Emergency Department Provider Note   ____________________________________________   First MD Initiated Contact with Patient 11/30/19 1058     (approximate)  I have reviewed the triage vital signs and the nursing notes.   HISTORY  Chief Complaint Rib Injury    HPI Isaac Meza is a 49 y.o. male patient complain of right rib pain secondary to heavy lifting/contusion incident yesterday.  Patient has a history of multiple lateral rib fractures in the past.  Patient did pain increased with deep inspirations.  Patient rates pain as a 9/10.  No palliative measure for complaint.  Patient described the pain as "achy".  Noticed patient's blood pressure elevated at 187/99.  Patient did not take his medication today.         Past Medical History:  Diagnosis Date  . Asthma    childhood  . Hypertension     Patient Active Problem List   Diagnosis Date Noted  . Bilateral thoracic back pain 10/13/2017    Past Surgical History:  Procedure Laterality Date  . FRACTURE SURGERY Right 2007   arm    Prior to Admission medications   Medication Sig Start Date End Date Taking? Authorizing Provider  ibuprofen (ADVIL) 600 MG tablet Take 1 tablet (600 mg total) by mouth every 8 (eight) hours as needed. 11/30/19   Joni Reining, PA-C  lidocaine (LIDODERM) 5 % Place 1 patch onto the skin every 12 (twelve) hours. Remove & Discard patch within 12 hours or as directed by MD 11/30/19 11/29/20  Joni Reining, PA-C  traMADol (ULTRAM) 50 MG tablet Take 1 tablet (50 mg total) by mouth every 6 (six) hours as needed for moderate pain. 11/30/19   Joni Reining, PA-C    Allergies Patient has no known allergies.  No family history on file.  Social History Social History   Tobacco Use  . Smoking status: Current Every Day Smoker    Packs/day: 1.00  . Smokeless tobacco: Never Used  Vaping Use  . Vaping Use: Never used  Substance Use Topics  . Alcohol use:  Yes  . Drug use: Never    Review of Systems Constitutional: No fever/chills Eyes: No visual changes. ENT: No sore throat. Cardiovascular: Denies chest pain. Respiratory: Denies shortness of breath. Gastrointestinal: No abdominal pain.  No nausea, no vomiting.  No diarrhea.  No constipation. Genitourinary: Negative for dysuria. Musculoskeletal: Lateral rib pain. Skin: Negative for rash. Neurological: Negative for headaches, focal weakness or numbness. Endocrine:  Hypertension ____________________________________________   PHYSICAL EXAM:  VITAL SIGNS: ED Triage Vitals  Enc Vitals Group     BP 11/30/19 1028 (!) 219/137     Pulse Rate 11/30/19 1028 91     Resp 11/30/19 1028 20     Temp 11/30/19 1028 98 F (36.7 C)     Temp Source 11/30/19 1028 Oral     SpO2 11/30/19 1028 99 %     Weight 11/30/19 1009 135 lb (61.2 kg)     Height 11/30/19 1009 5\' 8"  (1.727 m)     Head Circumference --      Peak Flow --      Pain Score 11/30/19 1009 9     Pain Loc --      Pain Edu? --      Excl. in GC? --     Constitutional: Alert and oriented. Well appearing and in no acute distress. Cardiovascular: Normal rate, regular rhythm. Grossly normal heart sounds.  Good peripheral circulation.  Very blood pressure Respiratory: Normal respiratory effort.  No retractions. Lungs CTAB. Gastrointestinal: Soft and nontender. No distention. No abdominal bruits. No CVA tenderness. Genitourinary: Deferred Musculoskeletal: No obvious chest wall deformity.  Patient is moderate guarding palpation of the right lateral ribs. Neurologic:  Normal speech and language. No gross focal neurologic deficits are appreciated. No gait instability. Skin:  Skin is warm, dry and intact. No rash noted.  No abrasion or ecchymosis. Psychiatric: Mood and affect are normal. Speech and behavior are normal.  ____________________________________________   LABS (all labs ordered are listed, but only abnormal results are  displayed)  Labs Reviewed - No data to display ____________________________________________  EKG   ____________________________________________  RADIOLOGY  ED MD interpretation:    Official radiology report(s): DG Ribs Unilateral W/Chest Right  Result Date: 11/30/2019 CLINICAL DATA:  Pain after lifting, history of rib fractures EXAM: RIGHT RIBS AND CHEST - 3+ VIEW COMPARISON:  None. FINDINGS: No acute right rib fracture identified. Multiple bilateral chronic rib fractures. Stable lung aeration. No pleural effusion or pneumothorax. Normal heart size. Evidence midthoracic vertebroplasty. IMPRESSION: No acute right code rib fracture. Electronically Signed   By: Guadlupe Spanish M.D.   On: 11/30/2019 13:30    ____________________________________________   PROCEDURES  Procedure(s) performed (including Critical Care):  Procedures   ____________________________________________   INITIAL IMPRESSION / ASSESSMENT AND PLAN / ED COURSE  As part of my medical decision making, I reviewed the following data within the electronic MEDICAL RECORD NUMBER     Patient presents with right rib pain secondary to contusion.  Discussed no acute rib/chest x-ray findings.  Patient complaining physical exam consistent with contusion.  Patient given discharge care instruction advised take medication as directed.  Patient advised [facility] with [facility].    Isaac Meza was evaluated in Emergency Department on 11/30/2019 for the symptoms described in the history of present illness. He was evaluated in the context of the global COVID-19 pandemic, which necessitated consideration that the patient might be at risk for infection with the SARS-CoV-2 virus that causes COVID-19. Institutional protocols and algorithms that pertain to the evaluation of patients at risk for COVID-19 are in a state of rapid change based on information released by regulatory bodies including the CDC and federal and state  organizations. These policies and algorithms were followed during the patient's care in the ED.       ____________________________________________   FINAL CLINICAL IMPRESSION(S) / ED DIAGNOSES  Final diagnoses:  Rib contusion, right, initial encounter     ED Discharge Orders         Ordered    lidocaine (LIDODERM) 5 %  Every 12 hours        11/30/19 1349    ibuprofen (ADVIL) 600 MG tablet  Every 8 hours PRN        11/30/19 1349    traMADol (ULTRAM) 50 MG tablet  Every 6 hours PRN        11/30/19 1349           Note:  This document was prepared using Dragon voice recognition software and may include unintentional dictation errors.    Joni Reining, PA-C 11/30/19 1451    Jene Every, MD 11/30/19 1520

## 2019-11-30 NOTE — ED Triage Notes (Signed)
Pt reports broke some ribs a few years back and yesterday lifted something heavy and thinks he may have fracture another rib on the right side or displaced it.

## 2019-11-30 NOTE — ED Notes (Signed)
Pt alert and oriented X 4, stable for discharge. RR even and unlabored, color WNL. Discussed discharge instructions and follow-up as directed. Discharge medications discussed if provided. Pt had opportunity to ask questions if necessary and RN to provide patient/family eduction.  

## 2019-11-30 NOTE — Discharge Instructions (Addendum)
Follow discharge care instruction take medication as directed. °

## 2019-11-30 NOTE — ED Notes (Signed)
See triage note  Presents with pain to right posterior rib pain  States pain is in mid back  States he broke his ribs several years ago  And was told that couple of them did not "heal"  Developed pain after lifting something heavy

## 2020-09-12 DIAGNOSIS — Z79891 Long term (current) use of opiate analgesic: Secondary | ICD-10-CM | POA: Insufficient documentation

## 2022-04-07 HISTORY — PX: APPENDECTOMY: SHX54

## 2022-04-30 ENCOUNTER — Ambulatory Visit: Payer: Medicaid Other | Admitting: Internal Medicine

## 2022-04-30 ENCOUNTER — Encounter: Payer: Self-pay | Admitting: Internal Medicine

## 2022-04-30 VITALS — BP 218/130 | HR 88 | Temp 97.7°F | Resp 16 | Ht 67.0 in | Wt 141.9 lb

## 2022-04-30 DIAGNOSIS — Z23 Encounter for immunization: Secondary | ICD-10-CM

## 2022-04-30 DIAGNOSIS — Z8709 Personal history of other diseases of the respiratory system: Secondary | ICD-10-CM

## 2022-04-30 DIAGNOSIS — Z114 Encounter for screening for human immunodeficiency virus [HIV]: Secondary | ICD-10-CM

## 2022-04-30 DIAGNOSIS — Z1211 Encounter for screening for malignant neoplasm of colon: Secondary | ICD-10-CM

## 2022-04-30 DIAGNOSIS — Z1159 Encounter for screening for other viral diseases: Secondary | ICD-10-CM

## 2022-04-30 DIAGNOSIS — R6882 Decreased libido: Secondary | ICD-10-CM

## 2022-04-30 DIAGNOSIS — I10 Essential (primary) hypertension: Secondary | ICD-10-CM

## 2022-04-30 DIAGNOSIS — Z13220 Encounter for screening for lipoid disorders: Secondary | ICD-10-CM | POA: Diagnosis not present

## 2022-04-30 LAB — CBC WITH DIFFERENTIAL/PLATELET
Basophils Absolute: 29 cells/uL (ref 0–200)
Eosinophils Relative: 1.1 %
Hemoglobin: 15.3 g/dL (ref 13.2–17.1)
MCH: 33.5 pg — ABNORMAL HIGH (ref 27.0–33.0)
MCHC: 35.3 g/dL (ref 32.0–36.0)
MCV: 94.7 fL (ref 80.0–100.0)
Neutro Abs: 3403 cells/uL (ref 1500–7800)
Neutrophils Relative %: 59.7 %
Total Lymphocyte: 28.9 %

## 2022-04-30 MED ORDER — LISINOPRIL 20 MG PO TABS: 20.0000 mg | ORAL_TABLET | Freq: Every day | ORAL | 1 refills | Status: DC

## 2022-04-30 NOTE — Patient Instructions (Signed)
It was great seeing you today!  Plan discussed at today's visit: -Blood work ordered today, results will be uploaded to MyChart.  -Start Lisinopril 20 mg daily -Cologuard ordered -Tdap vaccine today -Please bring in medication that you are taking to our next appointment   Follow up in: 1 month   Take care and let us know if you have any questions or concerns prior to your next visit.  Dr. Rosana Berger

## 2022-04-30 NOTE — Progress Notes (Signed)
New Patient Office Visit  Subjective    Patient ID: Isaac Meza, male    DOB: 12-22-1970  Age: 52 y.o. MRN: [id number]  CC:  Chief Complaint  Patient presents with   Establish Care    HPI Isaac Meza presents to establish care. He had been seeing a provider at [facility] but this was several years ago. Would like testosterone checked due to low libido.   Hypertension: -Medications: Nothing, had been on Lisinopril and HCTZ in the past -Checking BP at home (average): not checking -Denies any SOB, CP, vision changes, LE edema or symptoms of hypotension -Family history of father with MI in 52's, uncle in 58's and brother in upper 23's   Asthma, childhood:  -Asthma status: controlled -Current Treatments: Nothing, hasn't been on anything since childhood  -Dyspnea frequency: None -Wheezing frequency: None -Cough frequency: None  -Nocturnal symptom frequency: None  -Limitation of activity: no -Visits to ER or Urgent Care in past year: no  Chronic Back Pain/Opioid Use Disorder:  -Had been following with pain management at [facility] 03/12/20 but no notes since then  -Taking some kind of pain medication twice a day that he has ordered from [place].   Health Maintenance: -Blood work due -Colon cancer screening due  -Tdap due  Outpatient Encounter Medications as of 04/30/2022  Medication Sig   [DISCONTINUED] ibuprofen (ADVIL) 600 MG tablet Take 1 tablet (600 mg total) by mouth every 8 (eight) hours as needed.   [DISCONTINUED] traMADol (ULTRAM) 50 MG tablet Take 1 tablet (50 mg total) by mouth every 6 (six) hours as needed for moderate pain.   No facility-administered encounter medications on file as of 04/30/2022.    Past Medical History:  Diagnosis Date   Asthma    childhood   Hypertension     Past Surgical History:  Procedure Laterality Date   BACK SURGERY     FRACTURE SURGERY Right 2007   arm    History reviewed. No pertinent family history.  Social History    Socioeconomic History   Marital status: Single    Spouse name: Not on file   Number of children: Not on file   Years of education: Not on file   Highest education level: Not on file  Occupational History   Not on file  Tobacco Use   Smoking status: Every Day    Packs/day: 1.00    Types: Cigarettes   Smokeless tobacco: Never  Vaping Use   Vaping Use: Never used  Substance and Sexual Activity   Alcohol use: Yes    Comment: 1-2 beers per week   Drug use: Not Currently   Sexual activity: Yes  Other Topics Concern   Not on file  Social History Narrative   Not on file   Social Determinants of Health   Financial Resource Strain: Not on file  Food Insecurity: Not on file  Transportation Needs: Not on file  Physical Activity: Not on file  Stress: Not on file  Social Connections: Not on file  Intimate Partner Violence: Not on file    Review of Systems  Constitutional:  Negative for chills and fever.  Eyes:  Negative for blurred vision.  Respiratory:  Negative for cough, shortness of breath and wheezing.   Cardiovascular:  Negative for chest pain.  Musculoskeletal:  Positive for back pain.  Neurological:  Negative for headaches.        Objective    BP (!) 218/130   Pulse 88   Temp 97.7 F (36.5  C)   Resp 16   Ht 5\' 7"  (1.702 m)   Wt 141 lb 14.4 oz (64.4 kg)   SpO2 98%   BMI 22.22 kg/m   Physical Exam Constitutional:      Appearance: Normal appearance.  HENT:     Head: Normocephalic and atraumatic.  Eyes:     Conjunctiva/sclera: Conjunctivae normal.  Cardiovascular:     Rate and Rhythm: Normal rate and regular rhythm.  Pulmonary:     Effort: Pulmonary effort is normal.     Breath sounds: Normal breath sounds.  Musculoskeletal:     Right lower leg: No edema.     Left lower leg: No edema.  Skin:    General: Skin is warm and dry.  Neurological:     General: No focal deficit present.     Mental Status: He is alert. Mental status is at baseline.   Psychiatric:        Mood and Affect: Mood normal.        Behavior: Behavior normal.         Assessment & Plan:   1. Hypertension, unspecified type: BP extremely uncontrolled today, chronic and asymptomatic. Will go ahead and start Lisinopril 20 mg today and obtain labs. Consider starting HCTZ at follow up if labs look good. Discussed all side effects and potential risks with Lisinopril, patient understands and is agreeable. Follow up in 1 month.   - CBC w/Diff/Platelet - COMPLETE METABOLIC PANEL WITH GFR - lisinopril (ZESTRIL) 20 MG tablet; Take 1 tablet (20 mg total) by mouth daily.  Dispense: 30 tablet; Refill: 1  2. History of asthma: Hasn't been on medications since childhood, no symptoms. Continue to monitor.   3. Low libido: Check testosterone today.   - Testosterone  4. Lipid screening/Screening for HIV without presence of risk factors/Encounter for hepatitis C screening test for low risk patient: Screening labs due.   - Lipid Profile - HIV antibody (with reflex) - Hepatitis C Antibody  5. Screening for colon cancer: Cologuard ordered.   - Cologuard  6. Need for Tdap vaccination: Tdap administered today.   - Tdap vaccine greater than or equal to 7yo IM   Return in about 4 weeks (around 05/28/2022).   Isaac Medici, DO

## 2022-05-01 LAB — COMPLETE METABOLIC PANEL WITH GFR
AG Ratio: 2 (calc) (ref 1.0–2.5)
ALT: 9 U/L (ref 9–46)
AST: 15 U/L (ref 10–40)
Albumin: 4.3 g/dL (ref 3.6–5.1)
Alkaline phosphatase (APISO): 56 U/L (ref 36–130)
BUN: 8 mg/dL (ref 7–25)
CO2: 26 mmol/L (ref 20–32)
Calcium: 8.7 mg/dL (ref 8.6–10.3)
Chloride: 103 mmol/L (ref 98–110)
Creat: 0.73 mg/dL (ref 0.60–1.29)
Globulin: 2.1 g/dL (calc) (ref 1.9–3.7)
Glucose, Bld: 103 mg/dL — ABNORMAL HIGH (ref 65–99)
Potassium: 4 mmol/L (ref 3.5–5.3)
Sodium: 137 mmol/L (ref 135–146)
Total Bilirubin: 0.4 mg/dL (ref 0.2–1.2)
Total Protein: 6.4 g/dL (ref 6.1–8.1)
eGFR: 112 mL/min/{1.73_m2} (ref >=60)

## 2022-05-01 LAB — CBC WITH DIFFERENTIAL/PLATELET
Absolute Monocytes: 559 cells/uL (ref 200–950)
Basophils Relative: 0.5 %
Eosinophils Absolute: 63 cells/uL (ref 15–500)
HCT: 43.3 % (ref 38.5–50.0)
Lymphs Abs: 1647 cells/uL (ref 850–3900)
MPV: 9.4 fL (ref 7.5–12.5)
Monocytes Relative: 9.8 %
Platelets: 262 10*3/uL (ref 140–400)
RBC: 4.57 10*6/uL (ref 4.20–5.80)
RDW: 12 % (ref 11.0–15.0)
WBC: 5.7 10*3/uL (ref 3.8–10.8)

## 2022-05-01 LAB — HEPATITIS C ANTIBODY: Hepatitis C Ab: NONREACTIVE

## 2022-05-01 LAB — LIPID PANEL
Cholesterol: 169 mg/dL (ref <200)
HDL: 61 mg/dL (ref >=40)
LDL Cholesterol (Calc): 93 mg/dL (calc)
Non-HDL Cholesterol (Calc): 108 mg/dL (calc) (ref <130)
Total CHOL/HDL Ratio: 2.8 (calc) (ref <5.0)
Triglycerides: 63 mg/dL (ref <150)

## 2022-05-01 LAB — HIV ANTIBODY (ROUTINE TESTING W REFLEX): HIV 1&2 Ab, 4th Generation: NONREACTIVE

## 2022-05-01 LAB — TESTOSTERONE: Testosterone: 497 ng/dL (ref 250–827)

## 2022-05-13 DIAGNOSIS — Z1211 Encounter for screening for malignant neoplasm of colon: Secondary | ICD-10-CM | POA: Diagnosis not present

## 2022-05-16 ENCOUNTER — Other Ambulatory Visit: Payer: Self-pay

## 2022-05-16 ENCOUNTER — Emergency Department: Payer: Medicaid Other

## 2022-05-16 ENCOUNTER — Observation Stay: Payer: Medicaid Other | Admitting: Certified Registered Nurse Anesthetist

## 2022-05-16 ENCOUNTER — Encounter
Admission: EM | Disposition: A | Discharge status: Home / Self Care | Payer: Self-pay | Source: Home / Self Care | Attending: Emergency Medicine

## 2022-05-16 ENCOUNTER — Observation Stay
Admission: EM | Admit: 2022-05-16 | Discharge: 2022-05-17 | Disposition: A | Discharge status: Home / Self Care | Payer: Medicaid Other | Attending: Surgery | Admitting: Surgery

## 2022-05-16 DIAGNOSIS — K353 Acute appendicitis with localized peritonitis, without perforation or gangrene: Secondary | ICD-10-CM | POA: Diagnosis not present

## 2022-05-16 DIAGNOSIS — K3580 Unspecified acute appendicitis: Principal | ICD-10-CM | POA: Insufficient documentation

## 2022-05-16 DIAGNOSIS — F17210 Nicotine dependence, cigarettes, uncomplicated: Secondary | ICD-10-CM | POA: Diagnosis not present

## 2022-05-16 DIAGNOSIS — F17200 Nicotine dependence, unspecified, uncomplicated: Secondary | ICD-10-CM | POA: Diagnosis not present

## 2022-05-16 DIAGNOSIS — K5730 Diverticulosis of large intestine without perforation or abscess without bleeding: Secondary | ICD-10-CM | POA: Diagnosis not present

## 2022-05-16 DIAGNOSIS — K35891 Other acute appendicitis without perforation, with gangrene: Secondary | ICD-10-CM | POA: Diagnosis not present

## 2022-05-16 DIAGNOSIS — R1031 Right lower quadrant pain: Secondary | ICD-10-CM | POA: Diagnosis not present

## 2022-05-16 DIAGNOSIS — Z79899 Other long term (current) drug therapy: Secondary | ICD-10-CM | POA: Insufficient documentation

## 2022-05-16 DIAGNOSIS — J45909 Unspecified asthma, uncomplicated: Secondary | ICD-10-CM | POA: Insufficient documentation

## 2022-05-16 DIAGNOSIS — I10 Essential (primary) hypertension: Secondary | ICD-10-CM | POA: Insufficient documentation

## 2022-05-16 DIAGNOSIS — R1011 Right upper quadrant pain: Secondary | ICD-10-CM | POA: Diagnosis not present

## 2022-05-16 HISTORY — PX: XI ROBOTIC LAPAROSCOPIC ASSISTED APPENDECTOMY: SHX6877

## 2022-05-16 LAB — URINALYSIS, ROUTINE W REFLEX MICROSCOPIC
Bilirubin Urine: NEGATIVE
Glucose, UA: NEGATIVE mg/dL
Hgb urine dipstick: NEGATIVE
Ketones, ur: 5 mg/dL — AB
Leukocytes,Ua: NEGATIVE
Nitrite: NEGATIVE
Protein, ur: NEGATIVE mg/dL
Specific Gravity, Urine: 1.009 (ref 1.005–1.030)
pH: 6 (ref 5.0–8.0)

## 2022-05-16 LAB — CBC WITH DIFFERENTIAL/PLATELET
Abs Immature Granulocytes: 0.04 10*3/uL (ref 0.00–0.07)
Basophils Absolute: 0 10*3/uL (ref 0.0–0.1)
Basophils Relative: 0 %
Eosinophils Absolute: 0.2 10*3/uL (ref 0.0–0.5)
Eosinophils Relative: 1 %
HCT: 46.3 % (ref 39.0–52.0)
Hemoglobin: 16.1 g/dL (ref 13.0–17.0)
Immature Granulocytes: 0 %
Lymphocytes Relative: 15 %
Lymphs Abs: 2.2 10*3/uL (ref 0.7–4.0)
MCH: 32.2 pg (ref 26.0–34.0)
MCHC: 34.8 g/dL (ref 30.0–36.0)
MCV: 92.6 fL (ref 80.0–100.0)
Monocytes Absolute: 0.8 10*3/uL (ref 0.1–1.0)
Monocytes Relative: 6 %
Neutro Abs: 11.4 10*3/uL — ABNORMAL HIGH (ref 1.7–7.7)
Neutrophils Relative %: 78 %
Platelets: 328 10*3/uL (ref 150–400)
RBC: 5 MIL/uL (ref 4.22–5.81)
RDW: 12.2 % (ref 11.5–15.5)
WBC: 14.7 10*3/uL — ABNORMAL HIGH (ref 4.0–10.5)
nRBC: 0 % (ref 0.0–0.2)

## 2022-05-16 LAB — COMPREHENSIVE METABOLIC PANEL
ALT: 11 U/L (ref 0–44)
AST: 18 U/L (ref 15–41)
Albumin: 4.3 g/dL (ref 3.5–5.0)
Alkaline Phosphatase: 59 U/L (ref 38–126)
Anion gap: 7 (ref 5–15)
BUN: 6 mg/dL (ref 6–20)
CO2: 27 mmol/L (ref 22–32)
Calcium: 9.1 mg/dL (ref 8.9–10.3)
Chloride: 104 mmol/L (ref 98–111)
Creatinine, Ser: 0.85 mg/dL (ref 0.61–1.24)
GFR, Estimated: >60 mL/min (ref >60)
Glucose, Bld: 136 mg/dL — ABNORMAL HIGH (ref 70–99)
Potassium: 4.4 mmol/L (ref 3.5–5.1)
Sodium: 138 mmol/L (ref 135–145)
Total Bilirubin: 0.7 mg/dL (ref 0.3–1.2)
Total Protein: 7.3 g/dL (ref 6.5–8.1)

## 2022-05-16 LAB — LIPASE, BLOOD: Lipase: 47 U/L (ref 11–51)

## 2022-05-16 SURGERY — APPENDECTOMY, ROBOT-ASSISTED, LAPAROSCOPIC
Anesthesia: General | Site: Abdomen

## 2022-05-16 MED ORDER — SODIUM CHLORIDE 0.9 % IV BOLUS
1000.0000 mL | Freq: Once | INTRAVENOUS | Status: AC
  Administered 2022-05-16: 1000 mL via INTRAVENOUS

## 2022-05-16 MED ORDER — AMOXICILLIN-POT CLAVULANATE 875-125 MG PO TABS: 1.0000 | ORAL_TABLET | Freq: Two times a day (BID) | ORAL | 0 refills | Status: AC

## 2022-05-16 MED ORDER — ONDANSETRON HCL 4 MG/2ML IJ SOLN: 4.0000 mg | Freq: Four times a day (QID) | INTRAMUSCULAR | Status: DC | PRN

## 2022-05-16 MED ORDER — DEXAMETHASONE SODIUM PHOSPHATE 10 MG/ML IJ SOLN
INTRAMUSCULAR | Status: DC | PRN
  Administered 2022-05-16: 10 mg via INTRAVENOUS

## 2022-05-16 MED ORDER — SUGAMMADEX SODIUM 200 MG/2ML IV SOLN
INTRAVENOUS | Status: DC | PRN
  Administered 2022-05-16 (×2): 60 mg via INTRAVENOUS

## 2022-05-16 MED ORDER — KETOROLAC TROMETHAMINE 30 MG/ML IJ SOLN
30.0000 mg | Freq: Four times a day (QID) | INTRAMUSCULAR | Status: DC
  Administered 2022-05-16: 30 mg via INTRAVENOUS

## 2022-05-16 MED ORDER — MEPERIDINE HCL 25 MG/ML IJ SOLN
INTRAMUSCULAR | Status: AC
  Administered 2022-05-16: 12.5 mg via INTRAVENOUS
  Filled 2022-05-16: qty 1

## 2022-05-16 MED ORDER — PIPERACILLIN-TAZOBACTAM 3.375 G IVPB
3.3750 g | Freq: Three times a day (TID) | INTRAVENOUS | Status: DC
  Administered 2022-05-16 – 2022-05-17 (×4): 3.375 g via INTRAVENOUS
  Filled 2022-05-16 (×3): qty 50

## 2022-05-16 MED ORDER — KETOROLAC TROMETHAMINE 30 MG/ML IJ SOLN
INTRAMUSCULAR | Status: AC
  Filled 2022-05-16: qty 1

## 2022-05-16 MED ORDER — OXYCODONE HCL 5 MG PO TABS
5.0000 mg | ORAL_TABLET | ORAL | Status: DC | PRN
  Administered 2022-05-16 – 2022-05-17 (×2): 10 mg via ORAL
  Filled 2022-05-16 (×2): qty 2

## 2022-05-16 MED ORDER — HYDROMORPHONE HCL 1 MG/ML IJ SOLN
0.5000 mg | INTRAMUSCULAR | Status: DC | PRN
  Administered 2022-05-16: 0.5 mg via INTRAVENOUS
  Filled 2022-05-16: qty 0.5

## 2022-05-16 MED ORDER — ROCURONIUM BROMIDE 10 MG/ML (PF) SYRINGE
PREFILLED_SYRINGE | INTRAVENOUS | Status: AC
  Filled 2022-05-16: qty 10

## 2022-05-16 MED ORDER — IBUPROFEN 600 MG PO TABS: 600.0000 mg | ORAL_TABLET | Freq: Three times a day (TID) | ORAL | 1 refills | Status: DC | PRN

## 2022-05-16 MED ORDER — PANTOPRAZOLE SODIUM 40 MG IV SOLR
40.0000 mg | Freq: Every day | INTRAVENOUS | Status: DC
  Administered 2022-05-16: 40 mg via INTRAVENOUS
  Filled 2022-05-16: qty 10

## 2022-05-16 MED ORDER — BUPIVACAINE HCL (PF) 0.5 % IJ SOLN
INTRAMUSCULAR | Status: AC
  Filled 2022-05-16: qty 30

## 2022-05-16 MED ORDER — POLYETHYLENE GLYCOL 3350 17 G PO PACK: 17.0000 g | PACK | Freq: Every day | ORAL | Status: DC | PRN

## 2022-05-16 MED ORDER — KETOROLAC TROMETHAMINE 30 MG/ML IJ SOLN: 30.0000 mg | Freq: Four times a day (QID) | INTRAMUSCULAR | Status: DC

## 2022-05-16 MED ORDER — ACETAMINOPHEN 500 MG PO TABS
1000.0000 mg | ORAL_TABLET | Freq: Four times a day (QID) | ORAL | Status: DC
  Administered 2022-05-16 – 2022-05-17 (×3): 1000 mg via ORAL
  Filled 2022-05-16 (×3): qty 2

## 2022-05-16 MED ORDER — CHLORHEXIDINE GLUCONATE 0.12 % MT SOLN
OROMUCOSAL | Status: AC
  Administered 2022-05-16: 15 mL
  Filled 2022-05-16: qty 15

## 2022-05-16 MED ORDER — 0.9 % SODIUM CHLORIDE (POUR BTL) OPTIME
TOPICAL | Status: DC | PRN
  Administered 2022-05-16: 500 mL

## 2022-05-16 MED ORDER — OXYCODONE HCL 5 MG PO TABS: 5.0000 mg | ORAL_TABLET | ORAL | 0 refills | Status: DC | PRN

## 2022-05-16 MED ORDER — MEPERIDINE HCL 25 MG/ML IJ SOLN: 12.5000 mg | Freq: Once | INTRAMUSCULAR | Status: AC

## 2022-05-16 MED ORDER — HYDROMORPHONE HCL 1 MG/ML IJ SOLN
INTRAMUSCULAR | Status: DC | PRN
  Administered 2022-05-16 (×2): .5 mg via INTRAVENOUS

## 2022-05-16 MED ORDER — PROPOFOL 10 MG/ML IV BOLUS
INTRAVENOUS | Status: DC | PRN
  Administered 2022-05-16: 140 mg via INTRAVENOUS
  Administered 2022-05-16: 30 mg via INTRAVENOUS

## 2022-05-16 MED ORDER — FENTANYL CITRATE (PF) 100 MCG/2ML IJ SOLN
INTRAMUSCULAR | Status: DC | PRN
  Administered 2022-05-16 (×2): 50 ug via INTRAVENOUS

## 2022-05-16 MED ORDER — LIDOCAINE HCL (PF) 2 % IJ SOLN
INTRAMUSCULAR | Status: AC
  Filled 2022-05-16: qty 5

## 2022-05-16 MED ORDER — HYDROMORPHONE HCL 1 MG/ML IJ SOLN
INTRAMUSCULAR | Status: AC
  Filled 2022-05-16: qty 1

## 2022-05-16 MED ORDER — KETOROLAC TROMETHAMINE 30 MG/ML IJ SOLN
30.0000 mg | Freq: Four times a day (QID) | INTRAMUSCULAR | Status: DC
  Administered 2022-05-16 – 2022-05-17 (×3): 30 mg via INTRAVENOUS
  Filled 2022-05-16 (×3): qty 1

## 2022-05-16 MED ORDER — MIDAZOLAM HCL 2 MG/2ML IJ SOLN
INTRAMUSCULAR | Status: DC | PRN
  Administered 2022-05-16: 2 mg via INTRAVENOUS

## 2022-05-16 MED ORDER — LIDOCAINE HCL (CARDIAC) PF 100 MG/5ML IV SOSY
PREFILLED_SYRINGE | INTRAVENOUS | Status: DC | PRN
  Administered 2022-05-16: 60 mg via INTRAVENOUS

## 2022-05-16 MED ORDER — PROPOFOL 10 MG/ML IV BOLUS
INTRAVENOUS | Status: AC
  Filled 2022-05-16: qty 40

## 2022-05-16 MED ORDER — ACETAMINOPHEN 500 MG PO TABS: 1000.0000 mg | ORAL_TABLET | Freq: Four times a day (QID) | ORAL | Status: DC | PRN

## 2022-05-16 MED ORDER — BUPIVACAINE-EPINEPHRINE (PF) 0.5% -1:200000 IJ SOLN
INTRAMUSCULAR | Status: DC | PRN
  Administered 2022-05-16: 30 mL

## 2022-05-16 MED ORDER — ONDANSETRON 4 MG PO TBDP: 4.0000 mg | ORAL_TABLET | Freq: Four times a day (QID) | ORAL | Status: DC | PRN

## 2022-05-16 MED ORDER — HYDRALAZINE HCL 20 MG/ML IJ SOLN: 10.0000 mg | Freq: Four times a day (QID) | INTRAMUSCULAR | Status: DC | PRN

## 2022-05-16 MED ORDER — ONDANSETRON HCL 4 MG/2ML IJ SOLN
INTRAMUSCULAR | Status: DC | PRN
  Administered 2022-05-16: 4 mg via INTRAVENOUS

## 2022-05-16 MED ORDER — LABETALOL HCL 5 MG/ML IV SOLN
5.0000 mg | INTRAVENOUS | Status: DC | PRN
  Administered 2022-05-16: 5 mg via INTRAVENOUS

## 2022-05-16 MED ORDER — ONDANSETRON HCL 4 MG/2ML IJ SOLN: 4.0000 mg | Freq: Once | INTRAMUSCULAR | Status: DC | PRN

## 2022-05-16 MED ORDER — LABETALOL HCL 5 MG/ML IV SOLN
INTRAVENOUS | Status: AC
  Filled 2022-05-16: qty 4

## 2022-05-16 MED ORDER — MORPHINE SULFATE (PF) 4 MG/ML IV SOLN
4.0000 mg | Freq: Once | INTRAVENOUS | Status: AC
  Administered 2022-05-16: 4 mg via INTRAVENOUS
  Filled 2022-05-16: qty 1

## 2022-05-16 MED ORDER — LACTATED RINGERS IV SOLN: INTRAVENOUS | Status: DC

## 2022-05-16 MED ORDER — FENTANYL CITRATE (PF) 100 MCG/2ML IJ SOLN
INTRAMUSCULAR | Status: AC
  Filled 2022-05-16: qty 2

## 2022-05-16 MED ORDER — FENTANYL CITRATE (PF) 100 MCG/2ML IJ SOLN: 25.0000 ug | INTRAMUSCULAR | Status: DC | PRN

## 2022-05-16 MED ORDER — MIDAZOLAM HCL 2 MG/2ML IJ SOLN
INTRAMUSCULAR | Status: AC
  Filled 2022-05-16: qty 2

## 2022-05-16 MED ORDER — PIPERACILLIN-TAZOBACTAM 3.375 G IVPB
INTRAVENOUS | Status: AC
  Filled 2022-05-16: qty 50

## 2022-05-16 MED ORDER — DEXAMETHASONE SODIUM PHOSPHATE 10 MG/ML IJ SOLN
INTRAMUSCULAR | Status: AC
  Filled 2022-05-16: qty 1

## 2022-05-16 MED ORDER — LISINOPRIL 20 MG PO TABS
20.0000 mg | ORAL_TABLET | Freq: Every day | ORAL | Status: DC
  Administered 2022-05-16 – 2022-05-17 (×2): 20 mg via ORAL
  Filled 2022-05-16: qty 2
  Filled 2022-05-16: qty 1

## 2022-05-16 MED ORDER — ALBUTEROL SULFATE HFA 108 (90 BASE) MCG/ACT IN AERS
INHALATION_SPRAY | RESPIRATORY_TRACT | Status: DC | PRN
  Administered 2022-05-16: 3 via RESPIRATORY_TRACT
  Administered 2022-05-16: 4 via RESPIRATORY_TRACT

## 2022-05-16 MED ORDER — ONDANSETRON HCL 4 MG/2ML IJ SOLN
4.0000 mg | Freq: Once | INTRAMUSCULAR | Status: AC
  Administered 2022-05-16: 4 mg via INTRAVENOUS
  Filled 2022-05-16: qty 2

## 2022-05-16 MED ORDER — ONDANSETRON HCL 4 MG/2ML IJ SOLN
INTRAMUSCULAR | Status: AC
  Filled 2022-05-16: qty 2

## 2022-05-16 MED ORDER — HYDROMORPHONE HCL 1 MG/ML IJ SOLN
0.5000 mg | INTRAMUSCULAR | Status: DC | PRN
  Administered 2022-05-16 – 2022-05-17 (×2): 0.5 mg via INTRAVENOUS
  Filled 2022-05-16 (×2): qty 0.5

## 2022-05-16 MED ORDER — IOHEXOL 300 MG/ML  SOLN
100.0000 mL | Freq: Once | INTRAMUSCULAR | Status: AC | PRN
  Administered 2022-05-16: 100 mL via INTRAVENOUS

## 2022-05-16 MED ORDER — PHENYLEPHRINE HCL (PRESSORS) 10 MG/ML IV SOLN
INTRAVENOUS | Status: DC | PRN
  Administered 2022-05-16: 80 ug via INTRAVENOUS
  Administered 2022-05-16 (×3): 120 ug via INTRAVENOUS

## 2022-05-16 MED ORDER — ROCURONIUM BROMIDE 100 MG/10ML IV SOLN
INTRAVENOUS | Status: DC | PRN
  Administered 2022-05-16: 60 mg via INTRAVENOUS
  Administered 2022-05-16: 5 mg via INTRAVENOUS

## 2022-05-16 MED ORDER — METOPROLOL TARTRATE 5 MG/5ML IV SOLN
INTRAVENOUS | Status: DC | PRN
  Administered 2022-05-16: 2 mg via INTRAVENOUS

## 2022-05-16 SURGICAL SUPPLY — 71 items
ADH SKN CLS APL DERMABOND .7 (GAUZE/BANDAGES/DRESSINGS) ×1
BAG PRESSURE INF REUSE 1000 (BAG) IMPLANT
BLADE SURG SZ11 CARB STEEL (BLADE) ×2 IMPLANT
CANNULA REDUC XI 12-8 STAPL (CANNULA) ×1
CANNULA REDUCER 12-8 DVNC XI (CANNULA) ×2 IMPLANT
COVER TIP SHEARS 8 DVNC (MISCELLANEOUS) ×2 IMPLANT
COVER TIP SHEARS 8MM DA VINCI (MISCELLANEOUS)
DERMABOND ADVANCED .7 DNX12 (GAUZE/BANDAGES/DRESSINGS) ×2 IMPLANT
DRAPE ARM DVNC X/XI (DISPOSABLE) ×8 IMPLANT
DRAPE COLUMN DVNC XI (DISPOSABLE) ×2 IMPLANT
DRAPE DA VINCI XI ARM (DISPOSABLE) ×3
DRAPE DA VINCI XI COLUMN (DISPOSABLE) ×1
ELECT REM PT RETURN 9FT ADLT (ELECTROSURGICAL) ×1
ELECTRODE REM PT RTRN 9FT ADLT (ELECTROSURGICAL) ×2 IMPLANT
GLOVE BIOGEL PI IND STRL 6.5 (GLOVE) ×4 IMPLANT
GLOVE SURG SYN 6.5 ES PF (GLOVE) IMPLANT
GLOVE SURG SYN 6.5 PF PI (GLOVE) ×4 IMPLANT
GLOVE SURG SYN 7.0 (GLOVE) ×2 IMPLANT
GLOVE SURG SYN 7.0 PF PI (GLOVE) IMPLANT
GLOVE SURG SYN 7.5  E (GLOVE) ×2
GLOVE SURG SYN 7.5 E (GLOVE) ×2 IMPLANT
GLOVE SURG SYN 7.5 PF PI (GLOVE) IMPLANT
GOWN STRL REUS W/ TWL LRG LVL3 (GOWN DISPOSABLE) ×6 IMPLANT
GOWN STRL REUS W/TWL LRG LVL3 (GOWN DISPOSABLE) ×3
GRASPER SUT TROCAR 14GX15 (MISCELLANEOUS) IMPLANT
IRRIGATOR SUCT 8 DISP DVNC XI (IRRIGATION / IRRIGATOR) IMPLANT
IRRIGATOR SUCTION 8MM XI DISP (IRRIGATION / IRRIGATOR)
IV NS 1000ML (IV SOLUTION) ×1
IV NS 1000ML BAXH (IV SOLUTION) IMPLANT
KIT PINK PAD W/HEAD ARE REST (MISCELLANEOUS) ×1 IMPLANT
KIT PINK PAD W/HEAD ARM REST (MISCELLANEOUS) ×2 IMPLANT
LABEL OR SOLS (LABEL) IMPLANT
MANIFOLD NEPTUNE II (INSTRUMENTS) ×2 IMPLANT
NDL INSUFFLATION 14GA 120MM (NEEDLE) ×2 IMPLANT
NEEDLE HYPO 22GX1.5 SAFETY (NEEDLE) ×2 IMPLANT
NEEDLE INSUFFLATION 14GA 120MM (NEEDLE) ×1 IMPLANT
OBTURATOR OPTICAL STANDARD 8MM (TROCAR) ×1
OBTURATOR OPTICAL STND 8 DVNC (TROCAR) ×1
OBTURATOR OPTICALSTD 8 DVNC (TROCAR) ×2 IMPLANT
PACK LAP CHOLECYSTECTOMY (MISCELLANEOUS) ×2 IMPLANT
RELOAD STAPLE 45 2.5 WHT DVNC (STAPLE) IMPLANT
RELOAD STAPLE 45 3.5 BLU DVNC (STAPLE) IMPLANT
RELOAD STAPLER 2.5X45 WHT DVNC (STAPLE) IMPLANT
RELOAD STAPLER 3.5X45 BLU DVNC (STAPLE) ×1 IMPLANT
SEAL CANN UNIV 5-8 DVNC XI (MISCELLANEOUS) ×6 IMPLANT
SEAL XI 5MM-8MM UNIVERSAL (MISCELLANEOUS) ×3
SEALER VESSEL DA VINCI XI (MISCELLANEOUS) ×1
SEALER VESSEL EXT DVNC XI (MISCELLANEOUS) IMPLANT
SET TUBE SMOKE EVAC HIGH FLOW (TUBING) ×2 IMPLANT
SOL ELECTROSURG ANTI STICK (MISCELLANEOUS) ×1
SOLUTION ELECTROSURG ANTI STCK (MISCELLANEOUS) ×2 IMPLANT
SPONGE T-LAP 4X18 ~~LOC~~+RFID (SPONGE) ×2 IMPLANT
STAPLER 45 DA VINCI SURE FORM (STAPLE) ×1
STAPLER 45 SUREFORM DVNC (STAPLE) IMPLANT
STAPLER CANNULA SEAL DVNC XI (STAPLE) ×2 IMPLANT
STAPLER CANNULA SEAL XI (STAPLE) ×1
STAPLER RELOAD 2.5X45 WHITE (STAPLE)
STAPLER RELOAD 2.5X45 WHT DVNC (STAPLE)
STAPLER RELOAD 3.5X45 BLU DVNC (STAPLE) ×1
STAPLER RELOAD 3.5X45 BLUE (STAPLE) ×1
SUT MNCRL AB 4-0 PS2 18 (SUTURE) ×2 IMPLANT
SUT VIC AB 2-0 SH 27 (SUTURE)
SUT VIC AB 2-0 SH 27XBRD (SUTURE) ×2 IMPLANT
SUT VICRYL 0 UR6 27IN ABS (SUTURE) ×2 IMPLANT
SUT VLOC 90 6 CV-15 VIOLET (SUTURE) ×2 IMPLANT
SYR 30ML LL (SYRINGE) ×2 IMPLANT
SYS BAG RETRIEVAL 10MM (BASKET) ×1
SYSTEM BAG RETRIEVAL 10MM (BASKET) ×2 IMPLANT
TRAP FLUID SMOKE EVACUATOR (MISCELLANEOUS) ×2 IMPLANT
TRAY FOLEY MTR SLVR 16FR STAT (SET/KITS/TRAYS/PACK) IMPLANT
WATER STERILE IRR 500ML POUR (IV SOLUTION) ×2 IMPLANT

## 2022-05-16 NOTE — Transfer of Care (Signed)
Immediate Anesthesia Transfer of Care Note  Patient: Isaac Meza  Procedure(s) Performed: XI ROBOTIC LAPAROSCOPIC ASSISTED APPENDECTOMY (Abdomen)  Patient Location: PACU  Anesthesia Type:General  Level of Consciousness: awake, alert , and oriented  Airway & Oxygen Therapy: Patient Spontanous Breathing  Post-op Assessment: Report given to RN and Post -op Vital signs reviewed and stable  Post vital signs: Reviewed and stable  Last Vitals:  Vitals Value Taken Time  BP 186/118 05/16/22 1422  Temp 36.7 C 05/16/22 1422  Pulse 97 05/16/22 1425  Resp 13 05/16/22 1425  SpO2 98 % 05/16/22 1425  Vitals shown include unvalidated device data.  Last Pain:  Vitals:   05/16/22 1145  TempSrc: Temporal  PainSc: 9       Patients Stated Pain Goal: 2 ([id number])  Complications: No notable events documented.

## 2022-05-16 NOTE — ED Triage Notes (Signed)
Right sided flank pain for ~4 days. Pain has since radiated to RLQ/LLQ. Nausea but denies vomiting.

## 2022-05-16 NOTE — H&P (Signed)
Date of Admission:  05/16/2022  Reason for Admission:  Acute appendicitis  History of Present Illness: Isaac Meza is a 52 y.o. male presenting with a 1 day history of abdominal pain in the right lower quadrant.  Reports that he's had intermittent milder pain in the same area in the past, but last night was much more intense.  Most recently he had milder pain a week ago.  Denies any fevers, chills, chest pain, shortness of breath.  Reports having nausea, but no emesis.  Pain does not radiate.  Patient presented to the ED and workup showed a WBC of 14.7 and a CT scan showing acute appendicitis, with a dilated and inflamed appendix in retrocecal location.  No evidence of perforation.  I have personally viewed the images.  No prior abdominal surgery.  Past Medical History: Past Medical History:  Diagnosis Date   Asthma    childhood   Hypertension      Past Surgical History: Past Surgical History:  Procedure Laterality Date   BACK SURGERY     FRACTURE SURGERY Right 2007   arm    Home Medications: Prior to Admission medications   Medication Sig Start Date End Date Taking? Authorizing Provider  lisinopril (ZESTRIL) 20 MG tablet Take 1 tablet (20 mg total) by mouth daily. 04/30/22   Teodora Medici, DO    Allergies: No Known Allergies  Social History:  reports that he has been smoking cigarettes. He has been smoking an average of 1 pack per day. He has never used smokeless tobacco. He reports current alcohol use. He reports that he does not currently use drugs.   Family History: History reviewed. No pertinent family history.  Review of Systems: Review of Systems  Constitutional:  Negative for chills and fever.  HENT:  Negative for hearing loss.   Respiratory:  Negative for shortness of breath.   Cardiovascular:  Negative for chest pain.  Gastrointestinal:  Positive for abdominal pain and nausea. Negative for constipation, diarrhea and vomiting.  Genitourinary:  Negative  for dysuria.  Musculoskeletal:  Negative for myalgias.  Skin:  Negative for rash.  Neurological:  Negative for dizziness.  Psychiatric/Behavioral:  Negative for depression.     Physical Exam BP (!) 160/108   Pulse 84   Temp 97.6 F (36.4 C)   Resp 13   Ht 5' 8"$  (1.727 m)   Wt 65.8 kg   SpO2 94%   BMI 22.05 kg/m  CONSTITUTIONAL: No acute distress, well nourished HEENT:  Normocephalic, atraumatic, extraocular motion intact. NECK: Trachea is midline, and there is no jugular venous distension.  RESPIRATORY:  Normal respiratory effort without pathologic use of accessory muscles. CARDIOVASCULAR: Regular rhythm and rate. GI: The abdomen is soft, non-distended with tenderness to palpation mostly in the right lower quadrant and also right lateral abdomen, consistent with retrocecal appendix location.  No diffuse peritonitis.  MUSCULOSKELETAL:  Normal muscle strength and tone in all four extremities.  No peripheral edema or cyanosis. SKIN: Skin turgor is normal. There are no pathologic skin lesions.  NEUROLOGIC:  Motor and sensation is grossly normal.  Cranial nerves are grossly intact. PSYCH:  Alert and oriented to person, place and time. Affect is normal.  Laboratory Analysis: Results for orders placed or performed during the hospital encounter of 05/16/22 (from the past 24 hour(s))  Urinalysis, Routine w reflex microscopic -Urine, Clean Catch     Status: Abnormal   Collection Time: 05/16/22  4:13 AM  Result Value Ref Range   Color, Urine YELLOW (  A) YELLOW   APPearance CLEAR (A) CLEAR   Specific Gravity, Urine 1.009 1.005 - 1.030   pH 6.0 5.0 - 8.0   Glucose, UA NEGATIVE NEGATIVE mg/dL   Hgb urine dipstick NEGATIVE NEGATIVE   Bilirubin Urine NEGATIVE NEGATIVE   Ketones, ur 5 (A) NEGATIVE mg/dL   Protein, ur NEGATIVE NEGATIVE mg/dL   Nitrite NEGATIVE NEGATIVE   Leukocytes,Ua NEGATIVE NEGATIVE  CBC with Differential     Status: Abnormal   Collection Time: 05/16/22  4:13 AM   Result Value Ref Range   WBC 14.7 (H) 4.0 - 10.5 K/uL   RBC 5.00 4.22 - 5.81 MIL/uL   Hemoglobin 16.1 13.0 - 17.0 g/dL   HCT 46.3 39.0 - 52.0 %   MCV 92.6 80.0 - 100.0 fL   MCH 32.2 26.0 - 34.0 pg   MCHC 34.8 30.0 - 36.0 g/dL   RDW 12.2 11.5 - 15.5 %   Platelets 328 150 - 400 K/uL   nRBC 0.0 0.0 - 0.2 %   Neutrophils Relative % 78 %   Neutro Abs 11.4 (H) 1.7 - 7.7 K/uL   Lymphocytes Relative 15 %   Lymphs Abs 2.2 0.7 - 4.0 K/uL   Monocytes Relative 6 %   Monocytes Absolute 0.8 0.1 - 1.0 K/uL   Eosinophils Relative 1 %   Eosinophils Absolute 0.2 0.0 - 0.5 K/uL   Basophils Relative 0 %   Basophils Absolute 0.0 0.0 - 0.1 K/uL   Immature Granulocytes 0 %   Abs Immature Granulocytes 0.04 0.00 - 0.07 K/uL  Comprehensive metabolic panel     Status: Abnormal   Collection Time: 05/16/22  4:13 AM  Result Value Ref Range   Sodium 138 135 - 145 mmol/L   Potassium 4.4 3.5 - 5.1 mmol/L   Chloride 104 98 - 111 mmol/L   CO2 27 22 - 32 mmol/L   Glucose, Bld 136 (H) 70 - 99 mg/dL   BUN 6 6 - 20 mg/dL   Creatinine, Ser 0.85 0.61 - 1.24 mg/dL   Calcium 9.1 8.9 - 10.3 mg/dL   Total Protein 7.3 6.5 - 8.1 g/dL   Albumin 4.3 3.5 - 5.0 g/dL   AST 18 15 - 41 U/L   ALT 11 0 - 44 U/L   Alkaline Phosphatase 59 38 - 126 U/L   Total Bilirubin 0.7 0.3 - 1.2 mg/dL   GFR, Estimated >60 >60 mL/min   Anion gap 7 5 - 15  Lipase, blood     Status: None   Collection Time: 05/16/22  4:13 AM  Result Value Ref Range   Lipase 47 11 - 51 U/L    Imaging: CT Abdomen Pelvis W Contrast  Result Date: 05/16/2022 CLINICAL DATA:  52 year old male with history of right lower quadrant abdominal pain and flank pain for the past 4 days. Nausea. EXAM: CT ABDOMEN AND PELVIS WITH CONTRAST TECHNIQUE: Multidetector CT imaging of the abdomen and pelvis was performed using the standard protocol following bolus administration of intravenous contrast. RADIATION DOSE REDUCTION: This exam was performed according to the  departmental dose-optimization program which includes automated exposure control, adjustment of the mA and/or kV according to patient size and/or use of iterative reconstruction technique. CONTRAST:  146m OMNIPAQUE IOHEXOL 300 MG/ML  SOLN COMPARISON:  No priors. FINDINGS: Lower chest: Areas of scarring and/or atelectasis are noted in the lower lobes of the lungs bilaterally (left-greater-than-right). Hepatobiliary: No suspicious cystic or solid hepatic lesions. No intra or extrahepatic biliary ductal dilatation. Gallbladder is  normal in appearance. Pancreas: No pancreatic mass. No pancreatic ductal dilatation. No pancreatic or peripancreatic fluid collections or inflammatory changes. Spleen: Unremarkable. Adrenals/Urinary Tract: Bilateral kidneys and bilateral adrenal glands are normal in appearance. No hydroureteronephrosis. Urinary bladder is unremarkable in appearance. Stomach/Bowel: The appearance of the stomach is normal. No pathologic dilatation of small bowel or colon. There is what appears to be a very large diverticulum extending from the anterior aspect of the proximal second portion of the duodenum (axial image 42 of series 2 and coronal image 38 of series 5) estimated to measure approximately 4.0 x 4.5 x 5.0 cm. This lesion has no definite surrounding inflammatory changes to indicate an acute diverticulitis at this time. Numerous colonic diverticula are also noted, without surrounding inflammatory changes to indicate an acute colonic diverticulitis at this time. However, the appendix is dilated and inflamed, indicative of an acute appendicitis. Appendix: Location: Retrocecal Diameter: 14 mm Appendicolith: None definite Mucosal hyper-enhancement: Present Extraluminal gas: None Periappendiceal collection: Periappendiceal inflammatory changes without well organized periappendiceal abscess. Vascular/Lymphatic: Aortic atherosclerosis, without evidence of aneurysm or dissection in the abdominal or pelvic  vasculature. No lymphadenopathy noted in the abdomen or pelvis. Reproductive: Prostate gland and seminal vesicles are unremarkable in appearance. Other: No significant volume of ascites.  No pneumoperitoneum. Musculoskeletal: Mixed area of sclerosis and lucency in the proximal right femoral diaphysis occupying the marrow space best appreciated on axial image 92 of series 2 and coronal image 59 of series 5, potentially an enchondroma. IMPRESSION: 1. Findings are compatible with an acute appendicitis, as above. Surgical consultation is recommended. 2. Colonic diverticulosis without evidence of acute diverticulitis at this time. 3. 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 No findings to suggest an acute duodenal diverticulitis at this time, however, correlation with laboratory studies is recommended to exclude the possibility of macrocytic anemia, which can be a consequence of proximal small bowel intestinal overgrowth which can be seen in the setting of large duodenal diverticuli. 4. 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 Correlation with nonemergent right femur radiographs is recommended. Electronically Signed   By: Vinnie Langton M.D.   On: 05/16/2022 05:28    Assessment and Plan: This is a 52 y.o. male with acute appendicitis  --Discussed with the patient the findings on his labs and CT scan.  He has a retrocecal appendix and acute appendicitis.  No evidence of perforation or abscess.  Discussed the location of the appendix and how that affects the location of his pain.  Discussed with him that the management recommendation would be in the form of appendectomy and he's in agreement. --Discussed with him the plan for a robotic assisted appendectomy and reviewed the surgery at length with him including the incisions, risks of bleeding, infection, injury to surrounding structures, post-op pain control, activity  restrictions, and he's willing to proceed. --Patient will be admitted to surgery team and be made NPO with IV fluids, IV Zosyn, appropriate pain control.  Will plan for surgery today pending OR/anesthesia team availability.  I spent 75 minutes dedicated to the care of this patient on the date of this encounter to include pre-visit review of records, face-to-face time with the patient discussing diagnosis and management, and any post-visit coordination of care.   Melvyn Neth, MD [facility]:  [phone]

## 2022-05-16 NOTE — Op Note (Signed)
  Procedure Date:  05/16/2022  Pre-operative Diagnosis:  Acute appendicitis  Post-operative Diagnosis: Acute gangrenous appendicitis  Procedure:  Robotic assisted appendectomy  Surgeon:  Melvyn Neth, MD  Anesthesia:  General endotracheal  Estimated Blood Loss:  10 ml  Specimens:  appendix  Complications:  None  Indications for Procedure:  This is a 52 y.o. male who presents with abdominal pain and workup revealing acute appendicitis.  The options of surgery versus observation were reviewed with the patient and/or family. The risks of bleeding, abscess or infection, potential for an open procedure, and injury to surrounding structures were all discussed with the patient and was willing to proceed.  Description of Procedure: The patient was correctly identified in the preoperative area and brought into the operating room.  The patient was placed supine with VTE prophylaxis in place.  Appropriate time-outs were performed.  Anesthesia was induced and the patient was intubated.  Appropriate antibiotics were infused.  Foley catheter was inserted.  The abdomen was prepped and draped in a sterile fashion. A Veress needle was introduced in the left upper quadrant and pneumoperitoneum was obtained with appropriate pressures.  Using Optiview technique, an 8 mm port was introduced in the left lateral abdominal wall without complications.  Then, a 12 mm port was introduced in the left upper quadrant and an 8 mm port in the left lower quadrant under direct visualization.  The DaVinci platform was docked, camera targeted, and instruments placed under direct visualization.  The patient's appendix was in retrocecal position.  The cecum was mobilized medially and the appendix was identified.  It was inflamed over 80% of the body and with gangrenous changes along the wall.  No perforation.  The appendix was carefully dissected using combination of blunt dissection and Vessel sealer.  A window was created  in the mesoappendix at the base of the appendix and a blue load 45 mm stapler was used to staple across the base of the appendix.  After this, the vessel sealer was used to take down the mesoappendix.  The appendix was then placed in an Endocatch bag via the 12 mm port.  The right abdomen and pelvis were thoroughly irrigated.  The DaVinci platform was then undocked and instruments removed.    The Endocatch bag was retrieved and the 12 mm port was removed.  The fascia was closed under direct visualization utilizing an Endo Close technique with 0 Vicryl suture.  The 8 mm ports were removed. Local anesthetic was infiltrated over the port sites.  The 12 mm incision was closed using 3-0 Vicryl and 4-0 Monocryl, and the other port incisions were closed with 4-0 Monocryl.  The wounds were cleaned and sealed with DermaBond.  Foley catheter was removed.  The patient was emerged from anesthesia and extubated and brought to the recovery room for further management.  The patient tolerated the procedure well and all counts were correct at the end of the case.   Melvyn Neth, MD

## 2022-05-16 NOTE — Discharge Instructions (Signed)

## 2022-05-16 NOTE — Anesthesia Procedure Notes (Signed)
Procedure Name: Intubation Date/Time: 05/16/2022 12:40 PM  Performed by: Demetrius Charity, CRNAPre-anesthesia Checklist: Patient identified, Patient being monitored, Timeout performed, Emergency Drugs available and Suction available Patient Re-evaluated:Patient Re-evaluated prior to induction Oxygen Delivery Method: Circle system utilized Preoxygenation: Pre-oxygenation with 100% oxygen Induction Type: IV induction Ventilation: Mask ventilation without difficulty Laryngoscope Size: Mac and McGraph Grade View: Grade I Tube type: Oral Tube size: 7.0 mm Number of attempts: 1 Airway Equipment and Method: Stylet Placement Confirmation: ETT inserted through vocal cords under direct vision, positive ETCO2 and breath sounds checked- equal and bilateral Secured at: 21 cm Tube secured with: Tape Dental Injury: Teeth and Oropharynx as per pre-operative assessment

## 2022-05-16 NOTE — Anesthesia Preprocedure Evaluation (Signed)
Anesthesia Evaluation  Patient identified by MRN, date of birth, ID band Patient awake    Reviewed: Allergy & Precautions, H&P , NPO status , Patient's Chart, lab work & pertinent test results, reviewed documented beta blocker date and time   Airway Mallampati: II  TM Distance: >3 FB Neck ROM: full    Dental  (+) Teeth Intact   Pulmonary asthma , Current Smoker   Pulmonary exam normal        Cardiovascular Exercise Tolerance: Good hypertension, On Medications negative cardio ROS Normal cardiovascular exam Rhythm:regular Rate:Normal     Neuro/Psych negative neurological ROS  negative psych ROS   GI/Hepatic negative GI ROS, Neg liver ROS,,,  Endo/Other  negative endocrine ROS    Renal/GU negative Renal ROS  negative genitourinary   Musculoskeletal   Abdominal   Peds  Hematology negative hematology ROS (+)   Anesthesia Other Findings Past Medical History: No date: Asthma     Comment:  childhood No date: Hypertension Past Surgical History: No date: BACK SURGERY 2007: FRACTURE SURGERY; Right     Comment:  arm BMI    Body Mass Index: 22.05 kg/m     Reproductive/Obstetrics negative OB ROS                             Anesthesia Physical Anesthesia Plan  ASA: 2 and emergent  Anesthesia Plan: General ETT   Post-op Pain Management:    Induction:   PONV Risk Score and Plan:   Airway Management Planned:   Additional Equipment:   Intra-op Plan:   Post-operative Plan:   Informed Consent: I have reviewed the patients History and Physical, chart, labs and discussed the procedure including the risks, benefits and alternatives for the proposed anesthesia with the patient or authorized representative who has indicated his/her understanding and acceptance.     Dental Advisory Given  Plan Discussed with: CRNA  Anesthesia Plan Comments:        Anesthesia Quick Evaluation

## 2022-05-16 NOTE — ED Notes (Signed)
Patient transported to CT 

## 2022-05-16 NOTE — ED Provider Notes (Signed)
[facility] Provider Note    Event Date/Time   First MD Initiated Contact with Patient 05/16/22 [phone]     (approximate)   History   Abdominal Pain   HPI  Isaac Meza is a 52 y.o. male who presents to the ED from home with a chief complaint of right-sided abdominal/flank pain.  Patient reports a 1 week history of pain, resolved yesterday.  He was awakened from sleep with right-sided pain associated with nausea, no vomiting.  Denies fever/chills, cough, chest pain, shortness of breath, dysuria/hematuria, testicular pain/swelling, diarrhea.     Past Medical History   Past Medical History:  Diagnosis Date   Asthma    childhood   Hypertension      Active Problem List   Patient Active Problem List   Diagnosis Date Noted   Hypertension 04/30/2022   Bilateral thoracic back pain 10/13/2017     Past Surgical History   Past Surgical History:  Procedure Laterality Date   BACK SURGERY     FRACTURE SURGERY Right 2007   arm     Home Medications   Prior to Admission medications   Medication Sig Start Date End Date Taking? Authorizing Provider  lisinopril (ZESTRIL) 20 MG tablet Take 1 tablet (20 mg total) by mouth daily. 04/30/22   Teodora Medici, DO     Allergies  Patient has no known allergies.   Family History  History reviewed. No pertinent family history.   Physical Exam  Triage Vital Signs: ED Triage Vitals  Enc Vitals Group     BP 05/16/22 0407 (!) 157/98     Pulse Rate 05/16/22 0407 77     Resp 05/16/22 0407 18     Temp 05/16/22 0407 97.6 F (36.4 C)     Temp src --      SpO2 05/16/22 0407 100 %     Weight 05/16/22 0408 145 lb (65.8 kg)     Height 05/16/22 0408 5' 8"$  (1.727 m)     Head Circumference --      Peak Flow --      Pain Score 05/16/22 0408 9     Pain Loc --      Pain Edu? --      Excl. in [place]? --     Updated Vital Signs: BP (!) 160/108   Pulse 84   Temp 97.6 F (36.4 C)   Resp 13   Ht 5' 8"$   (1.727 m)   Wt 65.8 kg   SpO2 94%   BMI 22.05 kg/m    General: Awake, mild distress.  CV:  RRR.  Good peripheral perfusion.  Resp:  Normal effort.  CTAB. Abd:  Right upper and lower quadrant mildly tender to palpation without rebound or guarding.  Pain greater in right lower quadrant.  No distention.  Other:  No truncal vesicles.   ED Results / Procedures / Treatments  Labs (all labs ordered are listed, but only abnormal results are displayed) Labs Reviewed  URINALYSIS, ROUTINE W REFLEX MICROSCOPIC - Abnormal; Notable for the following components:      Result Value   Color, Urine YELLOW (*)    APPearance CLEAR (*)    Ketones, ur 5 (*)    All other components within normal limits  CBC WITH DIFFERENTIAL/PLATELET - Abnormal; Notable for the following components:   WBC 14.7 (*)    Neutro Abs 11.4 (*)    All other components within normal limits  COMPREHENSIVE METABOLIC PANEL - Abnormal; Notable  for the following components:   Glucose, Bld 136 (*)    All other components within normal limits  LIPASE, BLOOD     EKG  None   RADIOLOGY I have independently visualized and interpreted patient's CT scan as well as noted the radiology interpretation:  CT abdomen/pelvis: Retrocecal appendicitis  Official radiology report(s): CT Abdomen Pelvis W Contrast  Result Date: 05/16/2022 CLINICAL DATA:  52 year old male with history of right lower quadrant abdominal pain and flank pain for the past 4 days. Nausea. EXAM: CT ABDOMEN AND PELVIS WITH CONTRAST TECHNIQUE: Multidetector CT imaging of the abdomen and pelvis was performed using the standard protocol following bolus administration of intravenous contrast. RADIATION DOSE REDUCTION: This exam was performed according to the departmental dose-optimization program which includes automated exposure control, adjustment of the mA and/or kV according to patient size and/or use of iterative reconstruction technique. CONTRAST:  111m OMNIPAQUE  IOHEXOL 300 MG/ML  SOLN COMPARISON:  No priors. FINDINGS: Lower chest: Areas of scarring and/or atelectasis are noted in the lower lobes of the lungs bilaterally (left-greater-than-right). Hepatobiliary: No suspicious cystic or solid hepatic lesions. No intra or extrahepatic biliary ductal dilatation. Gallbladder is normal in appearance. Pancreas: No pancreatic mass. No pancreatic ductal dilatation. No pancreatic or peripancreatic fluid collections or inflammatory changes. Spleen: Unremarkable. Adrenals/Urinary Tract: Bilateral kidneys and bilateral adrenal glands are normal in appearance. No hydroureteronephrosis. Urinary bladder is unremarkable in appearance. Stomach/Bowel: The appearance of the stomach is normal. No pathologic dilatation of small bowel or colon. There is what appears to be a very large diverticulum extending from the anterior aspect of the proximal second portion of the duodenum (axial image 42 of series 2 and coronal image 38 of series 5) estimated to measure approximately 4.0 x 4.5 x 5.0 cm. This lesion has no definite surrounding inflammatory changes to indicate an acute diverticulitis at this time. Numerous colonic diverticula are also noted, without surrounding inflammatory changes to indicate an acute colonic diverticulitis at this time. However, the appendix is dilated and inflamed, indicative of an acute appendicitis. Appendix: Location: Retrocecal Diameter: 14 mm Appendicolith: None definite Mucosal hyper-enhancement: Present Extraluminal gas: None Periappendiceal collection: Periappendiceal inflammatory changes without well organized periappendiceal abscess. Vascular/Lymphatic: Aortic atherosclerosis, without evidence of aneurysm or dissection in the abdominal or pelvic vasculature. No lymphadenopathy noted in the abdomen or pelvis. Reproductive: Prostate gland and seminal vesicles are unremarkable in appearance. Other: No significant volume of ascites.  No pneumoperitoneum.  Musculoskeletal: Mixed area of sclerosis and lucency in the proximal right femoral diaphysis occupying the marrow space best appreciated on axial image 92 of series 2 and coronal image 59 of series 5, potentially an enchondroma. IMPRESSION: 1. Findings are compatible with an acute appendicitis, as above. Surgical consultation is recommended. 2. Colonic diverticulosis without evidence of acute diverticulitis at this time. 3. 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 No findings to suggest an acute duodenal diverticulitis at this time, however, correlation with laboratory studies is recommended to exclude the possibility of macrocytic anemia, which can be a consequence of proximal small bowel intestinal overgrowth which can be seen in the setting of large duodenal diverticuli. 4. 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 Correlation with nonemergent right femur radiographs is recommended. Electronically Signed   By: DVinnie LangtonM.D.   On: 05/16/2022 05:28     PROCEDURES:  Critical Care performed: Yes, see critical care procedure note(s)  CRITICAL CARE Performed by: SPaulette Blanch  Total  critical care time: 30 minutes  Critical care time was exclusive of separately billable procedures and treating other patients.  Critical care was necessary to treat or prevent imminent or life-threatening deterioration.  Critical care was time spent personally by me on the following activities: development of treatment plan with patient and/or surrogate as well as nursing, discussions with consultants, evaluation of patient's response to treatment, examination of patient, obtaining history from patient or surrogate, ordering and performing treatments and interventions, ordering and review of laboratory studies, ordering and review of radiographic studies, pulse oximetry and re-evaluation of patient's condition.   Marland Kitchen1-3 Lead EKG  Interpretation  Performed by: Paulette Blanch, MD Authorized by: Paulette Blanch, MD     Interpretation: normal     ECG rate:  75   ECG rate assessment: normal     Rhythm: sinus rhythm     Ectopy: none     Conduction: normal   Comments:     Patient placed on cardiac monitor to evaluate for arrhythmias    MEDICATIONS ORDERED IN ED: Medications  sodium chloride 0.9 % bolus 1,000 mL (1,000 mLs Intravenous New Bag/Given 05/16/22 0440)  ondansetron (ZOFRAN) injection 4 mg (4 mg Intravenous Given 05/16/22 0441)  morphine (PF) 4 MG/ML injection 4 mg (4 mg Intravenous Given 05/16/22 0441)  iohexol (OMNIPAQUE) 300 MG/ML solution 100 mL (100 mLs Intravenous Contrast Given 05/16/22 0503)     IMPRESSION / MDM / ASSESSMENT AND PLAN / ED COURSE  I reviewed the triage vital signs and the nursing notes.                             52 year old male presenting with right abdominal pain. Differential diagnosis includes, but is not limited to, acute appendicitis, renal colic, testicular torsion, urinary tract infection/pyelonephritis, prostatitis,  epididymitis, diverticulitis, small bowel obstruction or ileus, colitis, abdominal aortic aneurysm, gastroenteritis, hernia, etc. I have personally reviewed patient's records and note a PCP office visits for hypertension follow-up on 04/30/2022.  Patient's presentation is most consistent with acute presentation with potential threat to life or bodily function.  The patient is on the cardiac monitor to evaluate for evidence of arrhythmia and/or significant heart rate changes.  Will obtain lab work, UA and proceed with CT abdomen/pelvis.  Initiate IV fluid hydration, IV morphine for pain.  With IV Zofran for nausea.  Will reassess.  Clinical Course as of 05/16/22 0541  Fri May 16, 2022  0456 Laboratory results demonstrate moderate leukocytosis WBC [id number], normal metabolic panel and UA.  Awaiting CT scan. [JS]  [id number] CT positive for retrocecal appendicitis without  perforation or abscess.  Discussed case with general surgeon on-call Dr. Hampton Abbot who will evaluate patient in the emergency department for admission.  Upated patient and spouse who are agreeable with plan of care. [JS]    Clinical Course User Index [JS] Paulette Blanch, MD     FINAL CLINICAL IMPRESSION(S) / ED DIAGNOSES   Final diagnoses:  Right lower quadrant abdominal pain  Acute appendicitis, unspecified acute appendicitis type     Rx / DC Orders   ED Discharge Orders     None        Note:  This document was prepared using Dragon voice recognition software and may include unintentional dictation errors.   Paulette Blanch, MD 05/16/22 [phone]

## 2022-05-17 DIAGNOSIS — K3531 Acute appendicitis with localized peritonitis and gangrene, without perforation: Secondary | ICD-10-CM | POA: Diagnosis not present

## 2022-05-17 LAB — CBC
HCT: 38.8 % — ABNORMAL LOW (ref 39.0–52.0)
Hemoglobin: 13.5 g/dL (ref 13.0–17.0)
MCH: 32.1 pg (ref 26.0–34.0)
MCHC: 34.8 g/dL (ref 30.0–36.0)
MCV: 92.2 fL (ref 80.0–100.0)
Platelets: 256 10*3/uL (ref 150–400)
RBC: 4.21 MIL/uL — ABNORMAL LOW (ref 4.22–5.81)
RDW: 12.4 % (ref 11.5–15.5)
WBC: 15.6 10*3/uL — ABNORMAL HIGH (ref 4.0–10.5)
nRBC: 0 % (ref 0.0–0.2)

## 2022-05-17 LAB — BASIC METABOLIC PANEL
Anion gap: 7 (ref 5–15)
BUN: 9 mg/dL (ref 6–20)
CO2: 24 mmol/L (ref 22–32)
Calcium: 8.1 mg/dL — ABNORMAL LOW (ref 8.9–10.3)
Chloride: 102 mmol/L (ref 98–111)
Creatinine, Ser: 0.72 mg/dL (ref 0.61–1.24)
GFR, Estimated: >60 mL/min (ref >60)
Glucose, Bld: 134 mg/dL — ABNORMAL HIGH (ref 70–99)
Potassium: 3.3 mmol/L — ABNORMAL LOW (ref 3.5–5.1)
Sodium: 133 mmol/L — ABNORMAL LOW (ref 135–145)

## 2022-05-17 LAB — MAGNESIUM: Magnesium: 2 mg/dL (ref 1.7–2.4)

## 2022-05-17 NOTE — Discharge Summary (Signed)
Patient ID: Isaac Meza MRN: [id number] DOB/AGE: 07/25/1970 52 y.o.  Admit date: 05/16/2022 Discharge date: 05/17/2022   Discharge Diagnoses:  Principal Problem:   Acute appendicitis   Procedures: Robotic assisted laparoscopic appendectomy  Hospital Course: Patient admitted with acute appendicitis.  He underwent robotic appendectomy.  He tolerated the procedure well.  He has been recovering adequately.  Pain controlled.  Patient tolerating diet.  No issues with the wound.  Physical Exam Vitals reviewed.  HENT:     Head: Normocephalic.  Cardiovascular:     Rate and Rhythm: Normal rate and regular rhythm.  Pulmonary:     Effort: Pulmonary effort is normal.     Breath sounds: Normal breath sounds.  Abdominal:     General: Abdomen is flat. Bowel sounds are normal.     Palpations: Abdomen is soft.     Tenderness: There is no abdominal tenderness.  Skin:    Capillary Refill: Capillary refill takes less than 2 seconds.  Neurological:     Mental Status: He is alert and oriented to person, place, and time.      Consults: None  Disposition: Discharge disposition: 01-Home or Self Care       Discharge Instructions     Call MD for:  difficulty breathing, headache or visual disturbances   Complete by: As directed    Call MD for:  persistant nausea and vomiting   Complete by: As directed    Call MD for:  redness, tenderness, or signs of infection (pain, swelling, redness, odor or green/yellow discharge around incision site)   Complete by: As directed    Call MD for:  severe uncontrolled pain   Complete by: As directed    Call MD for:  temperature >100.4   Complete by: As directed    Diet - low sodium heart healthy   Complete by: As directed    Diet general   Complete by: As directed    Regular diet as tolerated   Discharge instructions   Complete by: As directed    1.  Patient may shower, but do not scrub wounds heavily and dab dry only. 2.  Do not submerge  wounds in pool/tub until fully healed. 3.  Do not apply ointments or hydrogen peroxide to the wounds. 4.  May apply ice packs to the wounds for comfort.   Driving Restrictions   Complete by: As directed    Do not drive while taking narcotics for pain control.  Prior to driving, make sure you are able to rotate right and left to look at blindspots without significant pain or discomfort.   Increase activity slowly   Complete by: As directed    Increase activity slowly   Complete by: As directed    Lifting restrictions   Complete by: As directed    No heavy lifting or pushing of more than 10-15 lbs for 4 weeks.   No dressing needed   Complete by: As directed       Allergies as of 05/17/2022   No Known Allergies      Medication List     TAKE these medications    acetaminophen 500 MG tablet Commonly known as: TYLENOL Take 2 tablets (1,000 mg total) by mouth every 6 (six) hours as needed for mild pain.   amoxicillin-clavulanate 875-125 MG tablet Commonly known as: AUGMENTIN Take 1 tablet by mouth 2 (two) times daily for 7 days.   ibuprofen 600 MG tablet Commonly known as: ADVIL Take 1 tablet (600 mg  total) by mouth every 8 (eight) hours as needed for moderate pain.   lisinopril 20 MG tablet Commonly known as: ZESTRIL Take 1 tablet (20 mg total) by mouth daily.   oxyCODONE 5 MG immediate release tablet Commonly known as: Oxy IR/ROXICODONE Take 1 tablet (5 mg total) by mouth every 4 (four) hours as needed for severe pain.               Discharge Care Instructions  (From admission, onward)           Start     Ordered   05/16/22 0000  No dressing needed        05/16/22 1439            Follow-up Information     Tylene Fantasia, PA-C Follow up in 2 week(s).   Specialty: Physician Assistant Contact information: [address] [place] [address] [phone]

## 2022-05-17 NOTE — Progress Notes (Signed)
Discharge instructions were reviewed with patient and his wife. Questions were encouraged and answered. IV was taken out. Belongings were collected by family member. Patient denies pain at this time.

## 2022-05-19 ENCOUNTER — Encounter: Payer: Self-pay | Admitting: Surgery

## 2022-05-19 NOTE — Anesthesia Postprocedure Evaluation (Signed)
Anesthesia Post Note  Patient: Isaac Meza  Procedure(s) Performed: XI ROBOTIC LAPAROSCOPIC ASSISTED APPENDECTOMY (Abdomen)  Patient location during evaluation: PACU Anesthesia Type: General Level of consciousness: awake and alert Pain management: pain level controlled Vital Signs Assessment: post-procedure vital signs reviewed and stable Respiratory status: spontaneous breathing, nonlabored ventilation, respiratory function stable and patient connected to nasal cannula oxygen Cardiovascular status: blood pressure returned to baseline and stable Postop Assessment: no apparent nausea or vomiting Anesthetic complications: no   No notable events documented.   Last Vitals:  Vitals:   05/17/22 0507 05/17/22 0729  BP: (!) 153/92 (!) 155/96  Pulse: 63 74  Resp: 16 18  Temp: 36.8 C 36.7 C  SpO2: 96% 96%    Last Pain:  Vitals:   05/17/22 0729  TempSrc: Oral  PainSc: 0-No pain                 Molli Barrows

## 2022-05-20 LAB — SURGICAL PATHOLOGY

## 2022-05-23 LAB — COLOGUARD: COLOGUARD: NEGATIVE

## 2022-05-28 NOTE — Progress Notes (Unsigned)
Established Patient Office Visit  Subjective    Patient ID: Isaac Meza, male    DOB: December 28, 1970  Age: 52 y.o. MRN: [id number]  CC:  No chief complaint on file.   HPI Isaac Meza presents to follow up. Since his LOV he was seen in the ER and ended up having an appendectomy.  Hypertension: -Medications: Nothing, had been on Lisinopril and HCTZ in the past -Checking BP at home (average): not checking -Denies any SOB, CP, vision changes, LE edema or symptoms of hypotension -Family history of father with MI in 20's, uncle in 32's and brother in upper 68's   Asthma, childhood:  -Asthma status: controlled -Current Treatments: Nothing, hasn't been on anything since childhood  -Dyspnea frequency: None -Wheezing frequency: None -Cough frequency: None  -Nocturnal symptom frequency: None  -Limitation of activity: no -Visits to ER or Urgent Care in past year: no  Chronic Back Pain/Opioid Use Disorder:  -Had been following with pain management at [facility] 03/12/20 but no notes since then  -Taking some kind of pain medication twice a day that he has ordered from [place].   Health Maintenance: -Blood work due -Colon cancer screening due  -Tdap due  Outpatient Encounter Medications as of 05/29/2022  Medication Sig   acetaminophen (TYLENOL) 500 MG tablet Take 2 tablets (1,000 mg total) by mouth every 6 (six) hours as needed for mild pain.   ibuprofen (ADVIL) 600 MG tablet Take 1 tablet (600 mg total) by mouth every 8 (eight) hours as needed for moderate pain.   lisinopril (ZESTRIL) 20 MG tablet Take 1 tablet (20 mg total) by mouth daily.   oxyCODONE (OXY IR/ROXICODONE) 5 MG immediate release tablet Take 1 tablet (5 mg total) by mouth every 4 (four) hours as needed for severe pain.   No facility-administered encounter medications on file as of 05/29/2022.    Past Medical History:  Diagnosis Date   Asthma    childhood   Hypertension     Past Surgical History:  Procedure  Laterality Date   BACK SURGERY     FRACTURE SURGERY Right 2007   arm   XI ROBOTIC LAPAROSCOPIC ASSISTED APPENDECTOMY N/A 05/16/2022   Procedure: XI ROBOTIC LAPAROSCOPIC ASSISTED APPENDECTOMY;  Surgeon: Olean Ree, MD;  Location: [facility] ORS;  Service: General;  Laterality: N/A;    No family history on file.  Social History   Socioeconomic History   Marital status: Single    Spouse name: Not on file   Number of children: Not on file   Years of education: Not on file   Highest education level: Not on file  Occupational History   Not on file  Tobacco Use   Smoking status: Every Day    Packs/day: 1.00    Types: Cigarettes   Smokeless tobacco: Never  Vaping Use   Vaping Use: Never used  Substance and Sexual Activity   Alcohol use: Yes    Comment: 1-2 beers per week   Drug use: Not Currently   Sexual activity: Yes  Other Topics Concern   Not on file  Social History Narrative   Not on file   Social Determinants of Health   Financial Resource Strain: Not on file  Food Insecurity: Not on file  Transportation Needs: Not on file  Physical Activity: Not on file  Stress: Not on file  Social Connections: Not on file  Intimate Partner Violence: Not on file    Review of Systems  Constitutional:  Negative for chills and fever.  Eyes:  Negative for blurred vision.  Respiratory:  Negative for cough, shortness of breath and wheezing.   Cardiovascular:  Negative for chest pain.  Musculoskeletal:  Positive for back pain.  Neurological:  Negative for headaches.        Objective    There were no vitals taken for this visit.  Physical Exam Constitutional:      Appearance: Normal appearance.  HENT:     Head: Normocephalic and atraumatic.  Eyes:     Conjunctiva/sclera: Conjunctivae normal.  Cardiovascular:     Rate and Rhythm: Normal rate and regular rhythm.  Pulmonary:     Effort: Pulmonary effort is normal.     Breath sounds: Normal breath sounds.  Musculoskeletal:      Right lower leg: No edema.     Left lower leg: No edema.  Skin:    General: Skin is warm and dry.  Neurological:     General: No focal deficit present.     Mental Status: He is alert. Mental status is at baseline.  Psychiatric:        Mood and Affect: Mood normal.        Behavior: Behavior normal.         Assessment & Plan:   1. Hypertension, unspecified type: BP extremely uncontrolled today, chronic and asymptomatic. Will go ahead and start Lisinopril 20 mg today and obtain labs. Consider starting HCTZ at follow up if labs look good. Discussed all side effects and potential risks with Lisinopril, patient understands and is agreeable. Follow up in 1 month.   - CBC w/Diff/Platelet - COMPLETE METABOLIC PANEL WITH GFR - lisinopril (ZESTRIL) 20 MG tablet; Take 1 tablet (20 mg total) by mouth daily.  Dispense: 30 tablet; Refill: 1  2. History of asthma: Hasn't been on medications since childhood, no symptoms. Continue to monitor.   3. Low libido: Check testosterone today.   - Testosterone  4. Lipid screening/Screening for HIV without presence of risk factors/Encounter for hepatitis C screening test for low risk patient: Screening labs due.   - Lipid Profile - HIV antibody (with reflex) - Hepatitis C Antibody  5. Screening for colon cancer: Cologuard ordered.   - Cologuard  6. Need for Tdap vaccination: Tdap administered today.   - Tdap vaccine greater than or equal to 7yo IM   No follow-ups on file.   Teodora Medici, DO

## 2022-05-29 ENCOUNTER — Encounter: Payer: Self-pay | Admitting: Physician Assistant

## 2022-05-29 ENCOUNTER — Ambulatory Visit: Payer: Medicaid Other | Admitting: Internal Medicine

## 2022-05-29 ENCOUNTER — Ambulatory Visit (INDEPENDENT_AMBULATORY_CARE_PROVIDER_SITE_OTHER): Payer: Medicaid Other | Admitting: Physician Assistant

## 2022-05-29 ENCOUNTER — Encounter: Payer: Self-pay | Admitting: Internal Medicine

## 2022-05-29 VITALS — BP 162/110 | HR 100 | Temp 97.7°F | Resp 18 | Ht 67.0 in | Wt 133.5 lb

## 2022-05-29 VITALS — BP 197/125 | HR 88 | Temp 99.0°F | Ht 68.0 in | Wt 133.0 lb

## 2022-05-29 DIAGNOSIS — Z9049 Acquired absence of other specified parts of digestive tract: Secondary | ICD-10-CM

## 2022-05-29 DIAGNOSIS — K35891 Other acute appendicitis without perforation, with gangrene: Secondary | ICD-10-CM

## 2022-05-29 DIAGNOSIS — K353 Acute appendicitis with localized peritonitis, without perforation or gangrene: Secondary | ICD-10-CM

## 2022-05-29 DIAGNOSIS — Z09 Encounter for follow-up examination after completed treatment for conditions other than malignant neoplasm: Secondary | ICD-10-CM

## 2022-05-29 DIAGNOSIS — I10 Essential (primary) hypertension: Secondary | ICD-10-CM | POA: Diagnosis not present

## 2022-05-29 MED ORDER — HYDROCHLOROTHIAZIDE 12.5 MG PO CAPS: 12.5000 mg | ORAL_CAPSULE | Freq: Every day | ORAL | 0 refills | Status: DC

## 2022-05-29 MED ORDER — LISINOPRIL 20 MG PO TABS: 20.0000 mg | ORAL_TABLET | Freq: Every day | ORAL | 0 refills | Status: DC

## 2022-05-29 NOTE — Progress Notes (Signed)
[facility] POST-OP OFFICE VISIT  05/29/2022  HPI: Isaac Meza is a 52 y.o. male 13 days s/p robotic assisted laparoscopic appendectomy for acute gangrenous appendicitis with Dr Isaac Meza   He is doing very well Had pain for about 7 days then resolved; no longer needing pain medications Completed Abx No fever, chills, nausea, emesis Tolerating PO - no issues with bowels Incisions healed well No other complaints   Vital signs: BP (!) 197/125   Pulse 88   Temp 99 F (37.2 C) (Oral)   Ht 5' 8"$  (1.727 m)   Wt 133 lb (60.3 kg)   SpO2 99%   BMI 20.22 kg/m    Physical Exam: Constitutional: Well appearing male, NAD Abdomen: Soft, non-tender, non-distended, no rebound/guarding Skin: Laparoscopic incisions are healing well, no erythema or drainage   Assessment/Plan: This is a 52 y.o. male 13 days s/p robotic assisted laparoscopic appendectomy for acute gangrenous appendicitis with Dr Isaac Meza    - Pain control prn  - Reviewed wound care recommendation  - Reviewed lifting restrictions; 4 weeks total  - Reviewed surgical pathology; Appendicitis   - He can follow up on as needed basis; He understands to call with questions/concerns  -- [name], PA-C [facility] 05/29/2022, 3:26 PM M-F: 7am - 4pm

## 2022-05-29 NOTE — Patient Instructions (Signed)
It was great seeing you today!  Plan discussed at today's visit: -Blood work ordered today, results will be uploaded to MyChart.  -Continue Lisinopril 20 mg -Start HCTZ 12.5 mg daily  -Continue to monitor blood pressure at home   Follow up in: 1 month   Take care and let us know if you have any questions or concerns prior to your next visit.  Dr. Rosana Berger

## 2022-05-29 NOTE — Patient Instructions (Signed)

## 2022-05-30 LAB — CBC WITH DIFFERENTIAL/PLATELET
Absolute Monocytes: 656 cells/uL (ref 200–950)
Basophils Absolute: 50 cells/uL (ref 0–200)
Basophils Relative: 0.6 %
Eosinophils Absolute: 158 cells/uL (ref 15–500)
Eosinophils Relative: 1.9 %
HCT: 44.1 % (ref 38.5–50.0)
Hemoglobin: 15.5 g/dL (ref 13.2–17.1)
Lymphs Abs: 2565 cells/uL (ref 850–3900)
MCH: 32.6 pg (ref 27.0–33.0)
MCHC: 35.1 g/dL (ref 32.0–36.0)
MCV: 92.6 fL (ref 80.0–100.0)
MPV: 9.2 fL (ref 7.5–12.5)
Monocytes Relative: 7.9 %
Neutro Abs: 4872 cells/uL (ref 1500–7800)
Neutrophils Relative %: 58.7 %
Platelets: 386 10*3/uL (ref 140–400)
RBC: 4.76 10*6/uL (ref 4.20–5.80)
RDW: 12.1 % (ref 11.0–15.0)
Total Lymphocyte: 30.9 %
WBC: 8.3 10*3/uL (ref 3.8–10.8)

## 2022-05-30 LAB — BASIC METABOLIC PANEL
BUN: 9 mg/dL (ref 7–25)
CO2: 26 mmol/L (ref 20–32)
Calcium: 9.4 mg/dL (ref 8.6–10.3)
Chloride: 101 mmol/L (ref 98–110)
Creat: 0.75 mg/dL (ref 0.60–1.29)
Glucose, Bld: 99 mg/dL (ref 65–99)
Potassium: 5.6 mmol/L — ABNORMAL HIGH (ref 3.5–5.3)
Sodium: 137 mmol/L (ref 135–146)

## 2022-06-06 ENCOUNTER — Other Ambulatory Visit: Payer: Self-pay

## 2022-06-06 ENCOUNTER — Emergency Department: Payer: Medicaid Other

## 2022-06-06 ENCOUNTER — Emergency Department
Admission: EM | Admit: 2022-06-06 | Discharge: 2022-06-06 | Disposition: A | Discharge status: Home / Self Care | Payer: Medicaid Other | Attending: Emergency Medicine | Admitting: Emergency Medicine

## 2022-06-06 DIAGNOSIS — R1011 Right upper quadrant pain: Secondary | ICD-10-CM | POA: Diagnosis not present

## 2022-06-06 DIAGNOSIS — K6389 Other specified diseases of intestine: Secondary | ICD-10-CM | POA: Diagnosis not present

## 2022-06-06 DIAGNOSIS — I10 Essential (primary) hypertension: Secondary | ICD-10-CM | POA: Insufficient documentation

## 2022-06-06 DIAGNOSIS — K859 Acute pancreatitis without necrosis or infection, unspecified: Secondary | ICD-10-CM | POA: Diagnosis not present

## 2022-06-06 DIAGNOSIS — R1084 Generalized abdominal pain: Secondary | ICD-10-CM | POA: Diagnosis not present

## 2022-06-06 LAB — COMPREHENSIVE METABOLIC PANEL
ALT: 11 U/L (ref 0–44)
AST: 15 U/L (ref 15–41)
Albumin: 4.5 g/dL (ref 3.5–5.0)
Alkaline Phosphatase: 76 U/L (ref 38–126)
Anion gap: 11 (ref 5–15)
BUN: 6 mg/dL (ref 6–20)
CO2: 29 mmol/L (ref 22–32)
Calcium: 9.5 mg/dL (ref 8.9–10.3)
Chloride: 97 mmol/L — ABNORMAL LOW (ref 98–111)
Creatinine, Ser: 0.75 mg/dL (ref 0.61–1.24)
GFR, Estimated: >60 mL/min (ref >60)
Glucose, Bld: 132 mg/dL — ABNORMAL HIGH (ref 70–99)
Potassium: 4.1 mmol/L (ref 3.5–5.1)
Sodium: 137 mmol/L (ref 135–145)
Total Bilirubin: 0.7 mg/dL (ref 0.3–1.2)
Total Protein: 7.4 g/dL (ref 6.5–8.1)

## 2022-06-06 LAB — CBC
HCT: 47.9 % (ref 39.0–52.0)
Hemoglobin: 16.7 g/dL (ref 13.0–17.0)
MCH: 32.2 pg (ref 26.0–34.0)
MCHC: 34.9 g/dL (ref 30.0–36.0)
MCV: 92.5 fL (ref 80.0–100.0)
Platelets: 407 10*3/uL — ABNORMAL HIGH (ref 150–400)
RBC: 5.18 MIL/uL (ref 4.22–5.81)
RDW: 12 % (ref 11.5–15.5)
WBC: 9.2 10*3/uL (ref 4.0–10.5)
nRBC: 0 % (ref 0.0–0.2)

## 2022-06-06 LAB — URINALYSIS, ROUTINE W REFLEX MICROSCOPIC
Bilirubin Urine: NEGATIVE
Glucose, UA: NEGATIVE mg/dL
Hgb urine dipstick: NEGATIVE
Ketones, ur: NEGATIVE mg/dL
Leukocytes,Ua: NEGATIVE
Nitrite: NEGATIVE
Protein, ur: NEGATIVE mg/dL
Specific Gravity, Urine: 1.004 — ABNORMAL LOW (ref 1.005–1.030)
pH: 7 (ref 5.0–8.0)

## 2022-06-06 LAB — LIPASE, BLOOD: Lipase: 163 U/L — ABNORMAL HIGH (ref 11–51)

## 2022-06-06 MED ORDER — IOHEXOL 300 MG/ML  SOLN
100.0000 mL | Freq: Once | INTRAMUSCULAR | Status: AC | PRN
  Administered 2022-06-06: 100 mL via INTRAVENOUS

## 2022-06-06 MED ORDER — OXYCODONE-ACETAMINOPHEN 5-325 MG PO TABS
1.0000 | ORAL_TABLET | Freq: Once | ORAL | Status: AC
  Administered 2022-06-06: 1 via ORAL
  Filled 2022-06-06: qty 1

## 2022-06-06 MED ORDER — ONDANSETRON HCL 4 MG/2ML IJ SOLN
4.0000 mg | Freq: Once | INTRAMUSCULAR | Status: AC
  Administered 2022-06-06: 4 mg via INTRAVENOUS
  Filled 2022-06-06: qty 2

## 2022-06-06 MED ORDER — OXYCODONE-ACETAMINOPHEN 5-325 MG PO TABS: 1.0000 | ORAL_TABLET | ORAL | 0 refills | Status: AC | PRN

## 2022-06-06 MED ORDER — KETOROLAC TROMETHAMINE 15 MG/ML IJ SOLN
15.0000 mg | Freq: Once | INTRAMUSCULAR | Status: AC
  Administered 2022-06-06: 15 mg via INTRAVENOUS
  Filled 2022-06-06: qty 1

## 2022-06-06 MED ORDER — SODIUM CHLORIDE 0.9 % IV BOLUS
1000.0000 mL | Freq: Once | INTRAVENOUS | Status: AC
  Administered 2022-06-06: 1000 mL via INTRAVENOUS

## 2022-06-06 MED ORDER — ONDANSETRON 4 MG PO TBDP: 4.0000 mg | ORAL_TABLET | Freq: Four times a day (QID) | ORAL | 0 refills | Status: DC | PRN

## 2022-06-06 NOTE — ED Notes (Signed)
Pt verbalizes understanding of discharge instructions. Opportunity for questioning and answers were provided. Pt discharged from ED to home with wife.    

## 2022-06-06 NOTE — ED Notes (Signed)
US at bedside

## 2022-06-06 NOTE — ED Triage Notes (Signed)
Pt to ED via [facility] from home. Pt had appendix removed 2 wks ago and started having increasing RLQ/Right flank pain on Monday. Pt also reports difficulty urinating. Pt denies vomiting but does endorse nausea. Pt hypertensive and on BP meds but has not taken them today.   EMS VS:  BP 171/152 98.9 oral 100% RA 87 HR

## 2022-06-06 NOTE — ED Notes (Signed)
Pt voided 549m into urinal. Post void bladder scanner showing 1323mof urin remaining. Pt denies the urge to urinate more or feeling full. MD made aware.

## 2022-06-06 NOTE — ED Notes (Signed)
Pt's BP elevated at 184/121. MD aware. Pt hx of htn and states that this is normal for him and that he has not taken his BP medication. Pt reports understanding of following up with PCP. MD instructs in continuation in discharge.

## 2022-06-06 NOTE — ED Provider Notes (Signed)
[facility] Provider Note    Event Date/Time   First MD Initiated Contact with Patient 06/06/22 [phone]     (approximate)   History   Chief Complaint: Abdominal Pain   HPI  Isaac Meza is a 52 y.o. male with a past history of hypertension who comes the ED complaining of right upper quadrant and right flank pain over the past few days, gradually worsening, this morning severe and intolerable.  Associated with nausea, no vomiting constipation or diarrhea.  No fever.  Patient had laparoscopic appendectomy 2 weeks ago.  Operative note from surgery reviewed, noting an uncomplicated procedure to remove a inflamed and somewhat gangrenous appendix.     Physical Exam   Triage Vital Signs: ED Triage Vitals  Enc Vitals Group     BP 06/06/22 0730 (!) 161/115     Pulse Rate 06/06/22 0740 79     Resp 06/06/22 0740 11     Temp 06/06/22 0743 98.7 F (37.1 C)     Temp Source 06/06/22 0743 Oral     SpO2 06/06/22 0740 99 %     Weight --      Height --      Head Circumference --      Peak Flow --      Pain Score 06/06/22 0741 10     Pain Loc --      Pain Edu? --      Excl. in [place]? --     Most recent vital signs: Vitals:   06/06/22 1030 06/06/22 1110  BP:    Pulse: 64 69  Resp: 16 18  Temp:    SpO2: 98% 96%    General: Awake, no distress.  CV:  Good peripheral perfusion.  Regular rate rhythm Resp:  Normal effort.  Abd:  No distention.  Soft, surgical incisions well-healed.  No peritoneal signs.  There is pronounced right upper quadrant tenderness as well as some suprapubic tenderness. Other:  No lower extremity swelling or calf tenderness.  Normal mental status   ED Results / Procedures / Treatments   Labs (all labs ordered are listed, but only abnormal results are displayed) Labs Reviewed  LIPASE, BLOOD - Abnormal; Notable for the following components:      Result Value   Lipase 163 (*)    All other components within normal limits   COMPREHENSIVE METABOLIC PANEL - Abnormal; Notable for the following components:   Chloride 97 (*)    Glucose, Bld 132 (*)    All other components within normal limits  CBC - Abnormal; Notable for the following components:   Platelets 407 (*)    All other components within normal limits  URINALYSIS, ROUTINE W REFLEX MICROSCOPIC - Abnormal; Notable for the following components:   Color, Urine STRAW (*)    APPearance CLEAR (*)    Specific Gravity, Urine 1.004 (*)    All other components within normal limits     EKG Interpreted by me Normal sinus rhythm rate of 83.  Normal axis intervals QRS ST segments and T waves.   RADIOLOGY Ultrasound right upper quadrant interpreted by me, negative for cholelithiasis or cholecystitis.  Radiology report reviewed, unremarkable.  CT abdomen pelvis shows pancreatitis of the head of the pancreas.   PROCEDURES:  Procedures   MEDICATIONS ORDERED IN ED: Medications  oxyCODONE-acetaminophen (PERCOCET/ROXICET) 5-325 MG per tablet 1 tablet (has no administration in time range)  sodium chloride 0.9 % bolus 1,000 mL (1,000 mLs Intravenous New Bag/Given 06/06/22 0812)  ondansetron [facility]) injection 4 mg (4 mg Intravenous Given 06/06/22 0812)  ketorolac (TORADOL) 15 MG/ML injection 15 mg (15 mg Intravenous Given 06/06/22 0812)  iohexol (OMNIPAQUE) 300 MG/ML solution 100 mL (100 mLs Intravenous Contrast Given 06/06/22 0912)     IMPRESSION / MDM / ASSESSMENT AND PLAN / ED COURSE  I reviewed the triage vital signs and the nursing notes.  DDx: Cholecystitis, ureterolithiasis, urinary retention, dehydration, AKI, iatrogenic bowel or bladder injury  Patient's presentation is most consistent with acute presentation with potential threat to life or bodily function.  Patient presents with abdominal pain 2 weeks after laparoscopic appendectomy.  Will start with IV fluids Toradol Zofran, right upper quadrant ultrasound, labs.  May need to proceed with CT abdomen  pelvis to look for a surgical complication.   Clinical Course as of 06/06/22 1134  Fri Jun 06, 2022  1126 CT shows relatively mild pancreatitis of the head of the pancreas.  Lipase is elevated at 160.  Vitals are normal.  Recommended admission but patient strongly prefers to try managing this at home.  Think this is reasonable given his otherwise reassuring workup.  Counseled him on a bland brat diet.  Will send prescriptions for pain and nausea control. [PS]    Clinical Course User Index [PS] Carrie Mew, MD     FINAL CLINICAL IMPRESSION(S) / ED DIAGNOSES   Final diagnoses:  Acute pancreatitis without infection or necrosis, unspecified pancreatitis type     Rx / DC Orders   ED Discharge Orders          Ordered    oxyCODONE-acetaminophen (PERCOCET) 5-325 MG tablet  Every 4 hours PRN        06/06/22 1132    ondansetron (ZOFRAN-ODT) 4 MG disintegrating tablet  Every 6 hours PRN        06/06/22 1132             Note:  This document was prepared using Dragon voice recognition software and may include unintentional dictation errors.   Carrie Mew, MD 06/06/22 1134

## 2022-06-06 NOTE — ED Notes (Signed)
Pt Iv retaped and cleaned. Pt given urinal to provide sample.

## 2022-06-11 ENCOUNTER — Telehealth: Payer: Self-pay | Admitting: *Deleted

## 2022-06-11 NOTE — Transitions of Care (Post Inpatient/ED Visit) (Signed)
   06/11/2022  Name: Isaac Meza MRN: [id number] DOB: December 17, 1970  Today's TOC FU Call Status: Today's TOC FU Call Status:: Successful TOC FU Call Competed TOC FU Call Complete Date: 06/11/22  Transition Care Management Follow-up Telephone Call Date of Discharge: 06/06/22 Discharge Facility: [facility]) Type of Discharge: Emergency Department Reason for ED Visit: Other: (pancreatitis) How have you been since you were released from the hospital?: Better Any questions or concerns?: No  Items Reviewed: Did you receive and understand the discharge instructions provided?: Yes Medications obtained and verified?: Yes (Medications Reviewed) Any new allergies since your discharge?: No Dietary orders reviewed?: Yes Type of Diet Ordered:: Bland/Brat diet Do you have support at home?: Yes People in Home: spouse, other relative(s) Name of Support/Comfort Primary Source: [facility] and Equipment/Supplies: [place] Ordered?: No Any new equipment or medical supplies ordered?: No  Functional Questionnaire: Do you need assistance with bathing/showering or dressing?: No Do you need assistance with meal preparation?: No Do you need assistance with eating?: No Do you have difficulty maintaining continence: No Do you need assistance with getting out of bed/getting out of a chair/moving?: No Do you have difficulty managing or taking your medications?: No  Folllow up appointments reviewed: PCP Follow-up appointment confirmed?: Yes Date of PCP follow-up appointment?: 07/02/22 Follow-up Provider: Dr. Teodora Medici Specialist [facility] Follow-up appointment confirmed?: NA Do you need transportation to your follow-up appointment?: No Do you understand care options if your condition(s) worsen?: Yes-patient verbalized understanding  SDOH Interventions Today    Flowsheet Row Most Recent Value  SDOH Interventions   Housing Interventions  Intervention Not Indicated  Transportation Interventions Intervention Not Indicated       Lurena Joiner RN, BSN [place] RN Care Coordinator

## 2022-06-30 ENCOUNTER — Other Ambulatory Visit: Payer: Self-pay | Admitting: Internal Medicine

## 2022-06-30 DIAGNOSIS — I10 Essential (primary) hypertension: Secondary | ICD-10-CM

## 2022-07-01 NOTE — Telephone Encounter (Signed)
Unable to refill per protocol, Rx request is too soon. Last refill 05/29/22 for 90 days.  Requested Prescriptions  Pending Prescriptions Disp Refills   lisinopril (ZESTRIL) 20 MG tablet [Pharmacy Med Name: LISINOPRIL 20MG  TABLETS] 30 tablet     Sig: TAKE 1 TABLET(20 MG) BY MOUTH DAILY     Cardiovascular:  ACE Inhibitors Failed - 06/30/2022  3:39 AM      Failed - Last BP in normal range    BP Readings from Last 1 Encounters:  06/06/22 (!) 184/121         Passed - Cr in normal range and within 180 days    Creat  Date Value Ref Range Status  05/29/2022 0.75 0.60 - 1.29 mg/dL Final   Creatinine, Ser  Date Value Ref Range Status  06/06/2022 0.75 0.61 - 1.24 mg/dL Final         Passed - K in normal range and within 180 days    Potassium  Date Value Ref Range Status  06/06/2022 4.1 3.5 - 5.1 mmol/L Final         Passed - Patient is not pregnant      Passed - Valid encounter within last 6 months    Recent Outpatient Visits           1 month ago Hypertension, unspecified type   [place], DO   2 months ago Hypertension, unspecified type   [facility] Teodora Medici, DO   4 years ago Traumatic pneumothorax, initial encounter   Primary Care at [facility], [place], [place]       Future Appointments             Tomorrow Teodora Medici, [facility], [facility]

## 2022-07-01 NOTE — Progress Notes (Unsigned)
Established Patient Office Visit  Subjective    Patient ID: Isaac Meza, male    DOB: 07/17/1970  Age: 52 y.o. MRN: [id number]  CC:  No chief complaint on file.   HPI Isaac Meza presents to follow up. Since his LOV he was seen in the ER and ended up having an appendectomy. Labs at discharge showed elevated WBC, potassium 3.3, sodium slightly low. Patient feels much better today, no pain.  Hypertension: -Medications: Lisinopril 20 mg, HCTZ 12.5 mg added at LOV -Checking BP at home (average): 130-160/ -Denies any SOB, CP, vision changes, LE edema or symptoms of hypotension -Family history of father with MI in 25's, uncle in 53's and brother in upper 36's   HLD: -Medications: Nothing -Last lipid panel: Lipid Panel     Component Value Date/Time   CHOL 169 04/30/2022 1116   TRIG 63 04/30/2022 1116   HDL 61 04/30/2022 1116   CHOLHDL 2.8 04/30/2022 1116   [place] 93 04/30/2022 1116     The 10-year ASCVD risk score (Arnett DK, et al., 2019) is: 9.9%   Values used to calculate the score:     Age: 15 years     Sex: Male     Is Non-Hispanic African American: No     Diabetic: No     Tobacco smoker: Yes     Systolic Blood Pressure: [id number] mmHg     Is BP treated: Yes     HDL Cholesterol: 61 mg/dL     Total Cholesterol: 169 mg/dL   Asthma, childhood:  -Asthma status: controlled -Current Treatments: Nothing, hasn't been on anything since childhood  -Dyspnea frequency: None -Wheezing frequency: None -Cough frequency: None  -Nocturnal symptom frequency: None  -Limitation of activity: no -Visits to ER or Urgent Care in past year: no  Chronic Back Pain/Opioid Use Disorder:  -Had been following with pain management at [facility] 03/12/20 but no notes since then  -Taking some kind of pain medication twice a day that he has ordered from [place].   Health Maintenance: -Blood work due -Colon cancer screening: Cologuard negative 2/24  Outpatient Encounter Medications as of  07/02/2022  Medication Sig   acetaminophen (TYLENOL) 500 MG tablet Take 2 tablets (1,000 mg total) by mouth every 6 (six) hours as needed for mild pain.   hydrochlorothiazide (MICROZIDE) 12.5 MG capsule Take 1 capsule (12.5 mg total) by mouth daily.   ibuprofen (ADVIL) 600 MG tablet Take 1 tablet (600 mg total) by mouth every 8 (eight) hours as needed for moderate pain.   lisinopril (ZESTRIL) 20 MG tablet Take 1 tablet (20 mg total) by mouth daily.   ondansetron (ZOFRAN-ODT) 4 MG disintegrating tablet Take 1 tablet (4 mg total) by mouth every 6 (six) hours as needed for nausea or vomiting.   oxyCODONE (OXY IR/ROXICODONE) 5 MG immediate release tablet Take 1 tablet (5 mg total) by mouth every 4 (four) hours as needed for severe pain.   No facility-administered encounter medications on file as of 07/02/2022.    Past Medical History:  Diagnosis Date   Asthma    childhood   Hypertension     Past Surgical History:  Procedure Laterality Date   BACK SURGERY     FRACTURE SURGERY Right 2007   arm   XI ROBOTIC LAPAROSCOPIC ASSISTED APPENDECTOMY N/A 05/16/2022   Procedure: XI ROBOTIC LAPAROSCOPIC ASSISTED APPENDECTOMY;  Surgeon: Olean Ree, MD;  Location: [facility] ORS;  Service: General;  Laterality: N/A;    No family history on file.  Social  History   Socioeconomic History   Marital status: Single    Spouse name: Not on file   Number of children: Not on file   Years of education: Not on file   Highest education level: Not on file  Occupational History   Not on file  Tobacco Use   Smoking status: Every Day    Packs/day: 1    Types: Cigarettes   Smokeless tobacco: Never  Vaping Use   Vaping Use: Never used  Substance and Sexual Activity   Alcohol use: Yes    Comment: 1-2 beers per week   Drug use: Not Currently   Sexual activity: Yes  Other Topics Concern   Not on file  Social History Narrative   Not on file   Social Determinants of Health   Financial Resource Strain: Not on  file  Food Insecurity: Not on file  Transportation Needs: No Transportation Needs (06/11/2022)   PRAPARE - Hydrologist (Medical): No    Lack of Transportation (Non-Medical): No  Physical Activity: Not on file  Stress: Not on file  Social Connections: Not on file  Intimate Partner Violence: Not on file    Review of Systems  Constitutional:  Negative for chills and fever.  Eyes:  Negative for blurred vision.  Respiratory:  Negative for cough, shortness of breath and wheezing.   Cardiovascular:  Negative for chest pain.  Gastrointestinal:  Negative for abdominal pain.  Neurological:  Negative for headaches.        Objective    There were no vitals taken for this visit.  Physical Exam Constitutional:      Appearance: Normal appearance.  HENT:     Head: Normocephalic and atraumatic.  Eyes:     Conjunctiva/sclera: Conjunctivae normal.  Cardiovascular:     Rate and Rhythm: Normal rate and regular rhythm.  Pulmonary:     Effort: Pulmonary effort is normal.     Breath sounds: Normal breath sounds.  Musculoskeletal:     Right lower leg: No edema.     Left lower leg: No edema.  Skin:    General: Skin is warm and dry.  Neurological:     General: No focal deficit present.     Mental Status: He is alert. Mental status is at baseline.  Psychiatric:        Mood and Affect: Mood normal.        Behavior: Behavior normal.         Assessment & Plan:   1. Hypertension, unspecified type: Improving but still not controlled. Continue Lisinopril 20 mg, refilled. Add HCTZ 12.5 mg, continue to monitor at home and follow up here in 1 month.   - hydrochlorothiazide (MICROZIDE) 12.5 MG capsule; Take 1 capsule (12.5 mg total) by mouth daily.  Dispense: 90 capsule; Refill: 0 - lisinopril (ZESTRIL) 20 MG tablet; Take 1 tablet (20 mg total) by mouth daily.  Dispense: 90 tablet; Refill: 0  2. S/P appendectomy: Surgical notes and discharge summary and labs from  05/17/22 reviewed. Repeat labs to ensure electrolyte abnormalities have resolved.   - CBC w/Diff/Platelet - Basic Metabolic Panel (BMET)   No follow-ups on file.   Isaac Medici, DO

## 2022-07-02 ENCOUNTER — Encounter: Payer: Self-pay | Admitting: Internal Medicine

## 2022-07-02 ENCOUNTER — Ambulatory Visit: Payer: Medicaid Other | Admitting: Internal Medicine

## 2022-07-02 VITALS — BP 140/88 | HR 84 | Temp 98.0°F | Resp 18 | Ht 67.0 in | Wt 133.2 lb

## 2022-07-02 DIAGNOSIS — R6882 Decreased libido: Secondary | ICD-10-CM

## 2022-07-02 DIAGNOSIS — I10 Essential (primary) hypertension: Secondary | ICD-10-CM | POA: Diagnosis not present

## 2022-07-02 DIAGNOSIS — F1090 Alcohol use, unspecified, uncomplicated: Secondary | ICD-10-CM

## 2022-07-27 ENCOUNTER — Emergency Department: Payer: Medicaid Other

## 2022-07-27 ENCOUNTER — Encounter: Payer: Self-pay | Admitting: Emergency Medicine

## 2022-07-27 ENCOUNTER — Encounter (HOSPITAL_COMMUNITY): Payer: Self-pay

## 2022-07-27 ENCOUNTER — Other Ambulatory Visit: Payer: Self-pay

## 2022-07-27 ENCOUNTER — Inpatient Hospital Stay
Admission: EM | Admit: 2022-07-27 | Discharge: 2022-07-29 | DRG: 381 | Disposition: A | Discharge status: Other Acute Inpatient Hospital | Payer: Medicaid Other | Attending: Emergency Medicine | Admitting: Emergency Medicine

## 2022-07-27 DIAGNOSIS — J45909 Unspecified asthma, uncomplicated: Secondary | ICD-10-CM | POA: Diagnosis not present

## 2022-07-27 DIAGNOSIS — R112 Nausea with vomiting, unspecified: Secondary | ICD-10-CM

## 2022-07-27 DIAGNOSIS — K860 Alcohol-induced chronic pancreatitis: Secondary | ICD-10-CM | POA: Diagnosis not present

## 2022-07-27 DIAGNOSIS — R1084 Generalized abdominal pain: Secondary | ICD-10-CM | POA: Diagnosis not present

## 2022-07-27 DIAGNOSIS — K861 Other chronic pancreatitis: Secondary | ICD-10-CM | POA: Diagnosis not present

## 2022-07-27 DIAGNOSIS — K5730 Diverticulosis of large intestine without perforation or abscess without bleeding: Secondary | ICD-10-CM | POA: Diagnosis not present

## 2022-07-27 DIAGNOSIS — K315 Obstruction of duodenum: Secondary | ICD-10-CM | POA: Diagnosis not present

## 2022-07-27 DIAGNOSIS — R9431 Abnormal electrocardiogram [ECG] [EKG]: Secondary | ICD-10-CM | POA: Diagnosis not present

## 2022-07-27 DIAGNOSIS — K56609 Unspecified intestinal obstruction, unspecified as to partial versus complete obstruction: Secondary | ICD-10-CM | POA: Diagnosis not present

## 2022-07-27 DIAGNOSIS — F17210 Nicotine dependence, cigarettes, uncomplicated: Secondary | ICD-10-CM | POA: Diagnosis not present

## 2022-07-27 DIAGNOSIS — K863 Pseudocyst of pancreas: Secondary | ICD-10-CM | POA: Diagnosis present

## 2022-07-27 DIAGNOSIS — R1013 Epigastric pain: Principal | ICD-10-CM

## 2022-07-27 DIAGNOSIS — F1010 Alcohol abuse, uncomplicated: Secondary | ICD-10-CM | POA: Diagnosis not present

## 2022-07-27 DIAGNOSIS — E43 Unspecified severe protein-calorie malnutrition: Secondary | ICD-10-CM | POA: Diagnosis not present

## 2022-07-27 DIAGNOSIS — I10 Essential (primary) hypertension: Secondary | ICD-10-CM | POA: Diagnosis not present

## 2022-07-27 DIAGNOSIS — R935 Abnormal findings on diagnostic imaging of other abdominal regions, including retroperitoneum: Secondary | ICD-10-CM | POA: Diagnosis not present

## 2022-07-27 DIAGNOSIS — K259 Gastric ulcer, unspecified as acute or chronic, without hemorrhage or perforation: Secondary | ICD-10-CM | POA: Diagnosis not present

## 2022-07-27 DIAGNOSIS — Z4659 Encounter for fitting and adjustment of other gastrointestinal appliance and device: Secondary | ICD-10-CM | POA: Diagnosis not present

## 2022-07-27 DIAGNOSIS — R590 Localized enlarged lymph nodes: Secondary | ICD-10-CM | POA: Diagnosis not present

## 2022-07-27 DIAGNOSIS — E860 Dehydration: Secondary | ICD-10-CM | POA: Diagnosis not present

## 2022-07-27 DIAGNOSIS — K862 Cyst of pancreas: Secondary | ICD-10-CM | POA: Diagnosis not present

## 2022-07-27 DIAGNOSIS — D649 Anemia, unspecified: Secondary | ICD-10-CM | POA: Diagnosis not present

## 2022-07-27 LAB — COMPREHENSIVE METABOLIC PANEL
ALT: 9 U/L (ref 0–44)
AST: 14 U/L — ABNORMAL LOW (ref 15–41)
Albumin: 4.5 g/dL (ref 3.5–5.0)
Alkaline Phosphatase: 89 U/L (ref 38–126)
Anion gap: 15 (ref 5–15)
BUN: 11 mg/dL (ref 6–20)
CO2: 26 mmol/L (ref 22–32)
Calcium: 9.8 mg/dL (ref 8.9–10.3)
Chloride: 96 mmol/L — ABNORMAL LOW (ref 98–111)
Creatinine, Ser: 0.87 mg/dL (ref 0.61–1.24)
GFR, Estimated: >60 mL/min (ref >60)
Glucose, Bld: 131 mg/dL — ABNORMAL HIGH (ref 70–99)
Potassium: 3.8 mmol/L (ref 3.5–5.1)
Sodium: 137 mmol/L (ref 135–145)
Total Bilirubin: 1.1 mg/dL (ref 0.3–1.2)
Total Protein: 7.8 g/dL (ref 6.5–8.1)

## 2022-07-27 LAB — CBC
HCT: 50.2 % (ref 39.0–52.0)
Hemoglobin: 17.5 g/dL — ABNORMAL HIGH (ref 13.0–17.0)
MCH: 31.9 pg (ref 26.0–34.0)
MCHC: 34.9 g/dL (ref 30.0–36.0)
MCV: 91.4 fL (ref 80.0–100.0)
Platelets: 396 10*3/uL (ref 150–400)
RBC: 5.49 MIL/uL (ref 4.22–5.81)
RDW: 11.9 % (ref 11.5–15.5)
WBC: 9.4 10*3/uL (ref 4.0–10.5)
nRBC: 0 % (ref 0.0–0.2)

## 2022-07-27 LAB — URINALYSIS, ROUTINE W REFLEX MICROSCOPIC
Bilirubin Urine: NEGATIVE
Glucose, UA: NEGATIVE mg/dL
Hgb urine dipstick: NEGATIVE
Ketones, ur: 80 mg/dL — AB
Leukocytes,Ua: NEGATIVE
Nitrite: NEGATIVE
Protein, ur: NEGATIVE mg/dL
Specific Gravity, Urine: >1.046 — ABNORMAL HIGH (ref 1.005–1.030)
pH: 6 (ref 5.0–8.0)

## 2022-07-27 LAB — LIPASE, BLOOD: Lipase: 124 U/L — ABNORMAL HIGH (ref 11–51)

## 2022-07-27 MED ORDER — ONDANSETRON HCL 4 MG/2ML IJ SOLN
4.0000 mg | Freq: Four times a day (QID) | INTRAMUSCULAR | Status: DC | PRN
  Administered 2022-07-28 – 2022-07-29 (×2): 4 mg via INTRAVENOUS
  Filled 2022-07-27 (×2): qty 2

## 2022-07-27 MED ORDER — LISINOPRIL 10 MG PO TABS: 20.0000 mg | ORAL_TABLET | Freq: Every day | ORAL | Status: DC

## 2022-07-27 MED ORDER — SODIUM CHLORIDE 0.9 % IV BOLUS
1000.0000 mL | Freq: Once | INTRAVENOUS | Status: AC
  Administered 2022-07-27: 1000 mL via INTRAVENOUS

## 2022-07-27 MED ORDER — IOHEXOL 300 MG/ML  SOLN
100.0000 mL | Freq: Once | INTRAMUSCULAR | Status: AC | PRN
  Administered 2022-07-27: 100 mL via INTRAVENOUS

## 2022-07-27 MED ORDER — ONDANSETRON HCL 4 MG/2ML IJ SOLN
4.0000 mg | Freq: Once | INTRAMUSCULAR | Status: AC
  Administered 2022-07-27: 4 mg via INTRAVENOUS
  Filled 2022-07-27: qty 2

## 2022-07-27 MED ORDER — HYDROCHLOROTHIAZIDE 12.5 MG PO TABS: 12.5000 mg | ORAL_TABLET | Freq: Every day | ORAL | Status: DC

## 2022-07-27 MED ORDER — MORPHINE SULFATE (PF) 4 MG/ML IV SOLN
4.0000 mg | INTRAVENOUS | Status: DC | PRN
  Administered 2022-07-27 – 2022-07-29 (×11): 4 mg via INTRAVENOUS
  Filled 2022-07-27 (×11): qty 1

## 2022-07-27 MED ORDER — ONDANSETRON 8 MG PO TBDP: 8.0000 mg | ORAL_TABLET | Freq: Three times a day (TID) | ORAL | 0 refills | Status: DC | PRN

## 2022-07-27 MED ORDER — SODIUM CHLORIDE 0.9 % IV SOLN: Freq: Once | INTRAVENOUS | Status: AC

## 2022-07-27 NOTE — ED Provider Notes (Addendum)
[facility] Provider Note   Event Date/Time   First MD Initiated Contact with Patient 07/27/22 [phone]     (approximate) History  Abdominal Pain  HPI Isaac Meza is a 52 y.o. male with a past medical history of hypertension and asthma who presents complaining of abdominal pain that has been worsening last 3-4 days.  Patient states that this pain increased on days prior to arrival unable to drink a lot of fluids since that time.  Patient states that he is concerned as he has a history of pancreatitis the pain is much worse this time than it was in the past.  Patient denies any recent travel or sick contacts.  Patient denies taking any medications for the symptoms. ROS: Patient currently denies any vision changes, tinnitus, difficulty speaking, facial droop, sore throat, chest pain, shortness of breath, diarrhea, dysuria, or weakness/numbness/paresthesias in any extremity   Physical Exam  Triage Vital Signs: ED Triage Vitals  Enc Vitals Group     BP 07/27/22 0726 (!) 148/100     Pulse Rate 07/27/22 0726 (!) 112     Resp 07/27/22 0726 16     Temp 07/27/22 0726 98.3 F (36.8 C)     Temp Source 07/27/22 0726 Oral     SpO2 07/27/22 0726 100 %     Weight 07/27/22 0721 120 lb (54.4 kg)     Height 07/27/22 0721  (1.702 m)     Head Circumference --      Peak Flow --      Pain Score 07/27/22 0721 10     Pain Loc --      Pain Edu? --      Excl. in GC? --    Most recent vital signs: Vitals:   07/27/22 1330 07/27/22 1400  BP: (!) 159/108 (!) 149/107  Pulse: 91 83  Resp:    Temp:    SpO2: 98% 96%   General: Awake, oriented x4. CV:  Good peripheral perfusion.  Resp:  Normal effort.  Abd:  No distention.  Other:  Middle-aged well-developed Caucasian male laying in bed in no acute distress. ED Results / Procedures / Treatments  Labs (all labs ordered are listed, but only abnormal results are displayed) Labs Reviewed  LIPASE, BLOOD - Abnormal; Notable  for the following components:      Result Value   Lipase 124 (*)    All other components within normal limits  COMPREHENSIVE METABOLIC PANEL - Abnormal; Notable for the following components:   Chloride 96 (*)    Glucose, Bld 131 (*)    AST 14 (*)    All other components within normal limits  CBC - Abnormal; Notable for the following components:   Hemoglobin 17.5 (*)    All other components within normal limits  URINALYSIS, ROUTINE W REFLEX MICROSCOPIC   EKG ED ECG REPORT I, Merwyn Katos, the attending physician, personally viewed and interpreted this ECG. Date: 07/27/2022 EKG Time: 0800 Rate: 86 Rhythm: normal sinus rhythm QRS Axis: normal Intervals: normal ST/T Wave abnormalities: normal Narrative Interpretation: no evidence of acute ischemia RADIOLOGY ED MD interpretation: CT of the abdomen/pelvis with IV contrast interpreted independently by me and shows no evidence of pancreatitis at this time.  There is evidence of chronic duodenal inflammation as well as a chronic fluid collection about the pancreatic head that is recommended to have follow-up endoscopy. -Agree with radiology assessment Official radiology report(s): DG Abd 1 View  Result Date: 07/27/2022 CLINICAL DATA:  Enteric tube placement. EXAM: ABDOMEN - 1 VIEW COMPARISON:  Same day CT abdomen pelvis FINDINGS: An enteric tube terminates in the stomach with the side port near the gastroesophageal junction. IMPRESSION: An enteric tube terminates in the stomach with the side port near the gastroesophageal junction. Electronically Signed   By: Romona Curls M.D.   On: 07/27/2022 12:32   CT ABDOMEN PELVIS W CONTRAST  Result Date: 07/27/2022 CLINICAL DATA:  52 year old male with history of generalized abdominal pain for the past 7 days. Severe pancreatitis. EXAM: CT ABDOMEN AND PELVIS WITH CONTRAST TECHNIQUE: Multidetector CT imaging of the abdomen and pelvis was performed using the standard protocol following bolus  administration of intravenous contrast. RADIATION DOSE REDUCTION: This exam was performed according to the departmental dose-optimization program which includes automated exposure control, adjustment of the mA and/or kV according to patient size and/or use of iterative reconstruction technique. CONTRAST:  OMNIPAQUE IOHEXOL 300 MG/ML  SOLN COMPARISON:  CT the abdomen and pelvis 06/06/2022. FINDINGS: Lower chest: 4 mm right middle lobe pulmonary nodule (axial image 22 of series 4), unchanged. Linear scarring in the base of the left lower lobe. Mild elevation of the left hemidiaphragm. Severe circumferential thickening of the distal esophagus. Hepatobiliary: No suspicious cystic or solid hepatic lesions. No intra or extrahepatic biliary ductal dilatation. Gallbladder is unremarkable in appearance. Pancreas: The body and tail of the pancreas are grossly normal in appearance. No pancreatic ductal dilatation. Adjacent to the pancreatic head there is a rim enhancing fluid collection (axial image 43 of series 2 and sagittal image 79 of series 6) measuring approximately 3.9 x 4.0 x 5.7 cm which is similar to prior studies dating back to 05/16/2022. Pancreatic head and uncinate process are otherwise grossly normal in appearance. Pancreatic parenchyma enhances normally. No overt surrounding peripancreatic inflammatory changes are noted on today's examination. Spleen: Unremarkable. Adrenals/Urinary Tract: Bilateral kidneys and bilateral adrenal glands are normal in appearance. No hydroureteronephrosis. Urinary bladder is normal in appearance. Stomach/Bowel: Stomach is distended and fluid-filled. Duodenal wall appears thickened and edematous, best appreciated on axial image 33 of series 2. Previously described peripancreatic rim enhancing fluid collection demonstrates communication with the medial wall of the proximal duodenal best appreciated on axial image 36 of series 2, coronal image 38 of series 5 and sagittal image  75 of series 6. No pathologic dilatation of small bowel or colon. A few scattered colonic diverticula are noted, without surrounding inflammatory changes to indicate an acute diverticulitis at this time. Status post appendectomy. Vascular/Lymphatic: Atherosclerosis in the abdominal aorta and pelvic vasculature, without evidence of aneurysm or dissection. No lymphadenopathy noted in the abdomen or pelvis. Reproductive: Prostate gland and seminal vesicles are unremarkable in appearance. Other: No significant volume of ascites.  No pneumoperitoneum. Musculoskeletal: There are no aggressive appearing lytic or blastic lesions noted in the visualized portions of the skeleton. IMPRESSION: 1. No definite peripancreatic inflammatory changes to indicate complicated pancreatitis. 2. Persistent duodenal wall thickening and edema with abnormal rim enhancing fluid collection adjacent to the pancreatic head and proximal duodenum, as detailed above, similar to prior studies. Whether this represents a chronic pancreatic pseudocyst, atypical duodenal diverticulum, or chronic contained perforated duodenal ulcer is uncertain. Further evaluation with endoscopy and endoscopic ultrasound should be considered in the appropriate clinical setting. 3. Severe circumferential thickening of the distal esophagus, which may suggest esophagitis. This too could be evaluated and at endoscopy if clinically appropriate. 4. Mild colonic diverticulosis without evidence of acute diverticulitis at this time. 5. 4 mm right  middle lobe pulmonary nodule, nonspecific, but stable compared to prior studies and statistically likely benign. No follow-up needed if patient is low-risk.This recommendation follows the consensus statement: Guidelines for Management of Incidental Pulmonary Nodules Detected on CT Images: From the Fleischner Society 2017; Radiology 2017; 284:228-243. 6. Aortic atherosclerosis. Electronically Signed   By: Trudie Reed M.D.   On:  07/27/2022 08:50   PROCEDURES: Critical Care performed: Yes, see critical care procedure note(s) .1-3 Lead EKG Interpretation  Performed by: Merwyn Katos, MD Authorized by: Merwyn Katos, MD     Interpretation: normal     ECG rate:  71   ECG rate assessment: normal     Rhythm: sinus rhythm     Ectopy: none     Conduction: normal   CRITICAL CARE Performed by: Merwyn Katos  Total critical care time: 57 minutes  Critical care time was exclusive of separately billable procedures and treating other patients.  Critical care was necessary to treat or prevent imminent or life-threatening deterioration.  Critical care was time spent personally by me on the following activities: development of treatment plan with patient and/or surrogate as well as nursing, discussions with consultants, evaluation of patient's response to treatment, examination of patient, obtaining history from patient or surrogate, ordering and performing treatments and interventions, ordering and review of laboratory studies, ordering and review of radiographic studies, pulse oximetry and re-evaluation of patient's condition.  MEDICATIONS ORDERED IN ED: Medications  morphine (PF) 4 MG/ML injection 4 mg (4 mg Intravenous Given 07/27/22 1257)  sodium chloride 0.9 % bolus 1,000 mL (0 mLs Intravenous Stopped 07/27/22 0843)  ondansetron (ZOFRAN) injection 4 mg (4 mg Intravenous Given 07/27/22 0750)  iohexol (OMNIPAQUE) 300 MG/ML solution 100 mL (100 mLs Intravenous Contrast Given 07/27/22 0824)   IMPRESSION / MDM / ASSESSMENT AND PLAN / ED COURSE  I reviewed the triage vital signs and the nursing notes.                             The patient is on the cardiac monitor to evaluate for evidence of arrhythmia and/or significant heart rate changes. Patient's presentation is most consistent with acute presentation with potential threat to life or bodily function.  This patient presents to the ED for concern of  nausea/vomiting, this involves an extensive number of treatment options, and is a complaint that carries with it a high risk of complications and morbidity.  The differential diagnosis includes small bowel obstruction, gastroenteritis, biliary obstruction, pancreatitis Co morbidities that complicate the patient evaluation  Tobacco abuse, alcohol abuse, recurrent pancreatitis, hypertension Additional history obtained:  Additional history obtained from patient's wife at bedside  External records from outside source obtained and reviewed including recent hospital admission for pancreatitis on 06/06/2022 Lab Tests:  I Ordered, and personally interpreted labs.  The pertinent results include: Lipase 124 Imaging Studies ordered:  I ordered imaging studies including CT abdomen pelvis  I independently visualized and interpreted imaging which showed findings as above including suspicious fluid collection about the duodenum versus pancreatic head  I agree with the radiologist interpretation Cardiac Monitoring: / EKG:  The patient was maintained on a cardiac monitor.  I personally viewed and interpreted the cardiac monitored which showed an underlying rhythm of: Normal sinus rhythm Consultations Obtained:  I requested consultation with the general surgery team, gastroenterology, and hospital service,  and discussed lab and imaging findings as well as pertinent plan - they recommend: Transfer to  [facility] for definitive gastroenterological/surgical management as needed Problem List / ED Course / Critical interventions / Medication management  Small bowel obstruction  I ordered medication including NG tube, Zofran for nausea vomiting  Reevaluation of the patient after these medicines showed that the patient improved  I have reviewed the patients home medicines and have made adjustments as needed  Dispo: Transfer to [facility]        FINAL CLINICAL IMPRESSION(S) / ED DIAGNOSES   Final  diagnoses:  Epigastric pain  Nausea and vomiting, unspecified vomiting type  Small bowel obstruction   Rx / DC Orders   ED Discharge Orders          Ordered    Ambulatory referral to Gastroenterology       Comments: Abnormal CT 07/27/2022 about the duodenum and pancreatic head, recommended follow-up endoscopy   07/27/22 0936    ondansetron (ZOFRAN-ODT) 8 MG disintegrating tablet  Every 8 hours PRN        07/27/22 1610           Note:  This document was prepared using Dragon voice recognition software and may include unintentional dictation errors.   Merwyn Katos, MD 07/27/22 [id number]    Merwyn Katos, MD 07/27/22 [phone]

## 2022-07-27 NOTE — ED Notes (Signed)
Pt states he is feeling much better. Able to press on stomach without pain at this time.

## 2022-07-27 NOTE — ED Notes (Signed)
Patient transported to CT 

## 2022-07-27 NOTE — ED Notes (Signed)
Pt given cup of water 

## 2022-07-27 NOTE — Progress Notes (Signed)
Patient with Hx of recurrent pancreatitis presented with abdominal pain nausea vomiting was found to have, bowel obstruction at the duodenum level, CT also showed signs of possible pseudocyst of pancreas. GI Dr. Elnoria Howard at [facility] recommend patient transfer to [facility] for potential EUS. NGT in, surgeon at [facility] Dr. Merceda Elks recommend no surgical intervention now. Accepted to [facility].

## 2022-07-27 NOTE — ED Notes (Signed)
Pt stating pain is starting to increase, asking for pain medicine.

## 2022-07-27 NOTE — ED Notes (Signed)
This RN called patient's wife Tradarius Reinwald to update her on patient's bed status at [facility]. Informed patient's wife that as of now patient will likely stay at [facility] overnight due to no bed availability at [facility].

## 2022-07-27 NOTE — ED Notes (Signed)
Patient is waiting to be transport to [facility] ,waiting on [facility] to call back with a bed

## 2022-07-27 NOTE — ED Notes (Signed)
Pt given cup of ice chips 

## 2022-07-27 NOTE — ED Triage Notes (Signed)
Pt to ED via POV with generalized abdominal pain for the last 7 days. Pt reports that he has not been able to eat in 3-4 days due to pain and loss of appetite. Pt reports that the pain increased on Thursday. Pt reports that he is unable to drink a lot of fluids. Pt reports that he has a hx/o pancreatitis but states that this pain is worse than last time. Pt is currently in NAD.

## 2022-07-28 MED ORDER — HYDRALAZINE HCL 20 MG/ML IJ SOLN
10.0000 mg | Freq: Three times a day (TID) | INTRAMUSCULAR | Status: DC | PRN
  Administered 2022-07-28: 10 mg via INTRAVENOUS
  Filled 2022-07-28: qty 1

## 2022-07-28 MED ORDER — DEXTROSE-NACL 5-0.45 % IV SOLN: INTRAVENOUS | Status: DC

## 2022-07-28 NOTE — ED Provider Notes (Addendum)
Care assumed of patient from outgoing provider.  See their note for initial history, exam and plan.  Clinical Course as of 07/28/22 0721  Mon Jul 28, 2022  0703 Boarding for [facility].  Pain, nausea and IVF ordered.  Complicated pancreatitis and waiting for transfer. PRN htn medications ordered.  [SM]    Clinical Course User Index [SM] Corena Herter, MD  Order maintenance fluid.  And IV pain medication as needed.  Continues to wait for transfer.   Corena Herter, MD 07/28/22 1610    Corena Herter, MD 07/28/22 1525

## 2022-07-28 NOTE — ED Provider Notes (Signed)
Vitals:   07/28/22 2208 07/28/22 2300  BP: 102/74 (!) 133/94  Pulse: 81 73  Resp: 20   Temp:    SpO2: 96% 95%     Ongoing care including pending transfer to [facility] assigned to Dr. Tanna Savoy, MD 07/28/22 [phone]

## 2022-07-28 NOTE — ED Notes (Signed)
Called  [facility] spoke  with  Isaac Meza  pt  still on  waitlist  informed  [place]  RN and  DR  Fanny Bien  MD  AND  [place]  CHARGE  NURSE

## 2022-07-28 NOTE — ED Notes (Signed)
Called  [facility]  pt  still on  waitlist  for  a  bed   informed  [place]  RN Dr  Arnoldo Morale  MD  AND  HEATHER  CHARGE  NURSE

## 2022-07-28 NOTE — ED Provider Notes (Signed)
4:45 AM  Pt awaiting transport to [facility] when bed available for complicated pancreatitis.  Has history of hypertension but unable to get lisinopril and hydrochlorothiazide here in due to being n.p.o. and having an NG tube to suction.  IV hydralazine as needed has been ordered for severe hypertension.  He has standing orders for IV fluids, pain and nausea medicine.  He has been stable here in the ED.  Will continue to closely monitor.   Brent Taillon, Layla Maw, DO 07/28/22 [phone]

## 2022-07-28 NOTE — ED Notes (Signed)
Hospital bed provided to patient in ED for comfort.

## 2022-07-28 NOTE — ED Notes (Signed)
Pt and wife voiced concerns about their delay in transport. Requesting to speak to their providing MD. Chipper Herb MD notified via secure chat of the pt's concern.

## 2022-07-29 ENCOUNTER — Other Ambulatory Visit: Payer: Self-pay

## 2022-07-29 ENCOUNTER — Inpatient Hospital Stay (HOSPITAL_COMMUNITY)
Admit: 2022-07-29 | Discharge: 2022-08-05 | DRG: 438 | Disposition: A | Discharge status: Home / Self Care | Payer: Medicaid Other | Source: Other Acute Inpatient Hospital | Attending: Internal Medicine | Admitting: Internal Medicine

## 2022-07-29 ENCOUNTER — Encounter (HOSPITAL_COMMUNITY): Payer: Self-pay | Admitting: Internal Medicine

## 2022-07-29 DIAGNOSIS — Z9049 Acquired absence of other specified parts of digestive tract: Secondary | ICD-10-CM | POA: Diagnosis not present

## 2022-07-29 DIAGNOSIS — E876 Hypokalemia: Secondary | ICD-10-CM | POA: Diagnosis not present

## 2022-07-29 DIAGNOSIS — R948 Abnormal results of function studies of other organs and systems: Secondary | ICD-10-CM | POA: Diagnosis present

## 2022-07-29 DIAGNOSIS — J45909 Unspecified asthma, uncomplicated: Secondary | ICD-10-CM | POA: Diagnosis not present

## 2022-07-29 DIAGNOSIS — K863 Pseudocyst of pancreas: Secondary | ICD-10-CM | POA: Diagnosis not present

## 2022-07-29 DIAGNOSIS — D649 Anemia, unspecified: Secondary | ICD-10-CM | POA: Diagnosis present

## 2022-07-29 DIAGNOSIS — I10 Essential (primary) hypertension: Secondary | ICD-10-CM | POA: Diagnosis present

## 2022-07-29 DIAGNOSIS — R935 Abnormal findings on diagnostic imaging of other abdominal regions, including retroperitoneum: Secondary | ICD-10-CM | POA: Diagnosis not present

## 2022-07-29 DIAGNOSIS — K862 Cyst of pancreas: Secondary | ICD-10-CM | POA: Diagnosis not present

## 2022-07-29 DIAGNOSIS — E871 Hypo-osmolality and hyponatremia: Secondary | ICD-10-CM | POA: Diagnosis present

## 2022-07-29 DIAGNOSIS — R590 Localized enlarged lymph nodes: Secondary | ICD-10-CM | POA: Diagnosis not present

## 2022-07-29 DIAGNOSIS — K860 Alcohol-induced chronic pancreatitis: Secondary | ICD-10-CM | POA: Diagnosis not present

## 2022-07-29 DIAGNOSIS — Z79899 Other long term (current) drug therapy: Secondary | ICD-10-CM | POA: Diagnosis not present

## 2022-07-29 DIAGNOSIS — F17210 Nicotine dependence, cigarettes, uncomplicated: Secondary | ICD-10-CM | POA: Diagnosis not present

## 2022-07-29 DIAGNOSIS — K254 Chronic or unspecified gastric ulcer with hemorrhage: Secondary | ICD-10-CM | POA: Diagnosis not present

## 2022-07-29 DIAGNOSIS — K861 Other chronic pancreatitis: Secondary | ICD-10-CM | POA: Diagnosis not present

## 2022-07-29 DIAGNOSIS — E878 Other disorders of electrolyte and fluid balance, not elsewhere classified: Secondary | ICD-10-CM | POA: Diagnosis present

## 2022-07-29 DIAGNOSIS — K3189 Other diseases of stomach and duodenum: Secondary | ICD-10-CM | POA: Diagnosis not present

## 2022-07-29 DIAGNOSIS — K315 Obstruction of duodenum: Secondary | ICD-10-CM | POA: Diagnosis not present

## 2022-07-29 DIAGNOSIS — Z681 Body mass index (BMI) 19 or less, adult: Secondary | ICD-10-CM | POA: Diagnosis not present

## 2022-07-29 DIAGNOSIS — R112 Nausea with vomiting, unspecified: Secondary | ICD-10-CM | POA: Diagnosis not present

## 2022-07-29 DIAGNOSIS — R1013 Epigastric pain: Secondary | ICD-10-CM | POA: Diagnosis not present

## 2022-07-29 DIAGNOSIS — G8929 Other chronic pain: Secondary | ICD-10-CM | POA: Diagnosis present

## 2022-07-29 DIAGNOSIS — M549 Dorsalgia, unspecified: Secondary | ICD-10-CM | POA: Diagnosis not present

## 2022-07-29 DIAGNOSIS — E43 Unspecified severe protein-calorie malnutrition: Secondary | ICD-10-CM | POA: Diagnosis not present

## 2022-07-29 DIAGNOSIS — E860 Dehydration: Secondary | ICD-10-CM | POA: Diagnosis present

## 2022-07-29 DIAGNOSIS — K259 Gastric ulcer, unspecified as acute or chronic, without hemorrhage or perforation: Secondary | ICD-10-CM | POA: Diagnosis not present

## 2022-07-29 DIAGNOSIS — K859 Acute pancreatitis without necrosis or infection, unspecified: Principal | ICD-10-CM

## 2022-07-29 DIAGNOSIS — F1010 Alcohol abuse, uncomplicated: Secondary | ICD-10-CM | POA: Diagnosis not present

## 2022-07-29 MED ORDER — ACETAMINOPHEN 325 MG PO TABS: 650.0000 mg | ORAL_TABLET | Freq: Four times a day (QID) | ORAL | Status: DC | PRN

## 2022-07-29 MED ORDER — ONDANSETRON HCL 4 MG PO TABS: 4.0000 mg | ORAL_TABLET | Freq: Four times a day (QID) | ORAL | Status: DC | PRN

## 2022-07-29 MED ORDER — ACETAMINOPHEN 650 MG RE SUPP: 650.0000 mg | Freq: Four times a day (QID) | RECTAL | Status: DC | PRN

## 2022-07-29 MED ORDER — HYDRALAZINE HCL 20 MG/ML IJ SOLN
10.0000 mg | Freq: Four times a day (QID) | INTRAMUSCULAR | Status: DC | PRN
  Administered 2022-07-29 – 2022-07-30 (×2): 10 mg via INTRAVENOUS
  Filled 2022-07-29 (×2): qty 1

## 2022-07-29 MED ORDER — DEXTROSE IN LACTATED RINGERS 5 % IV SOLN
INTRAVENOUS | Status: DC
  Filled 2022-07-29: qty 1000

## 2022-07-29 MED ORDER — PANTOPRAZOLE SODIUM 40 MG IV SOLR
40.0000 mg | INTRAVENOUS | Status: DC
  Administered 2022-07-29 – 2022-08-03 (×6): 40 mg via INTRAVENOUS
  Filled 2022-07-29 (×6): qty 10

## 2022-07-29 MED ORDER — ONDANSETRON HCL 4 MG/2ML IJ SOLN: 4.0000 mg | Freq: Four times a day (QID) | INTRAMUSCULAR | Status: DC | PRN

## 2022-07-29 MED ORDER — HYDROMORPHONE HCL 1 MG/ML IJ SOLN
1.0000 mg | INTRAMUSCULAR | Status: DC | PRN
  Administered 2022-07-29 – 2022-08-04 (×25): 1 mg via INTRAVENOUS
  Filled 2022-07-29 (×25): qty 1

## 2022-07-29 MED ORDER — ENOXAPARIN SODIUM 40 MG/0.4ML IJ SOSY: 40.0000 mg | PREFILLED_SYRINGE | INTRAMUSCULAR | Status: DC

## 2022-07-29 NOTE — ED Provider Notes (Signed)
Emergency Medicine Observation Re-evaluation Note  Physical Exam   BP 113/75   Pulse 68   Temp 97.9 F (36.6 C) (Oral)   Resp 20   Ht  (1.702 m)   Wt 54.4 kg   SpO2 94%   BMI 18.79 kg/m   Patient appears in no acute distress.  Resting comfortably.  ED Course / MDM   He states that his abdominal pain has well-controlled with current medications.  I updated him on his transfer, awaiting bed.  Pilar Jarvis MD    Pilar Jarvis, MD 07/29/22 [phone]

## 2022-07-29 NOTE — Assessment & Plan Note (Signed)
Add balanced electrolyte solutions with d5LR at 100 ml per hr. Continue close follow up on renal function and electrolytes.  Keep K at 4 and Mg at 2.

## 2022-07-29 NOTE — Assessment & Plan Note (Addendum)
Pancreatic pseudocyst Ileus   Plan to continue supportive medical care with as needed IV analgesics (iv hydromorphone q 4 hrs as needed) and as needed antiemetics.  Antiacid therapy with proton pump inhibitor IV Supportive IV fluids with balance electrolyte solutions.  Keep NPO for now. Continue NG tube to low intermittent suction.  I have messaged Dr Elnoria Howard from GI.

## 2022-07-29 NOTE — H&P (Addendum)
History and Physical    Patient: Isaac Meza WUJ:[id number] DOB: 1970-12-02 DOA: 07/29/2022 DOS: the patient was seen and examined on 07/29/2022 PCP: Margarita Mail, DO  Patient coming from: Home  Chief Complaint: No chief complaint on file.  HPI: Isaac Meza is a 52 y.o. male with medical history significant of hypertension. He had a recent appendectomy in 05/2022 (robotic assisted laparoscopic procedure), with no apparent complications. In 06/06/2022 he was seen at the ED for mild pancreatitis and he was treated with supportive medical therapy, he did not require hospitalization.   Now he reports 1 week of abdominal pain, epigastric in location, dull to sharp in nature, that has been persistent and worsening. He has been not able to eat due to pain, and he did not have any bowel movements, only passing gas occasionally over the last seven days. About 3 days after the onset of the abdominal pain he started to taking oral analgesics (percocet, medication left over from his prior diagnosis of pancreatitis). His pain was persistent despite oral analgesics and about 4 days later he developed vomiting and was not able to drink liquid anymore.   Considering with persistent and worsening pain and not able to have any po intake he decided to go to the ED.  He was evaluated at [facility], he was placed on IV fluids and IV analgesics. NG tube was inserted for decompression.   CT abdomen with chronic pancreatic pseudocyst, with fluid collection, adjacent to the pancreatic head and duodenum. Surgery and GI were consulted and recommendations was to transfer patient to [facility] for further work up with endoscopic [place].   Patient denies any fevers, no diarrhea, or dyspnea.    Review of Systems: As mentioned in the history of present illness. All other systems reviewed and are negative. Past Medical History:  Diagnosis Date   Asthma    childhood   Hypertension    Past Surgical History:  Procedure  Laterality Date   BACK SURGERY     FRACTURE SURGERY Right 2007   arm   XI ROBOTIC LAPAROSCOPIC ASSISTED APPENDECTOMY N/A 05/16/2022   Procedure: XI ROBOTIC LAPAROSCOPIC ASSISTED APPENDECTOMY;  Surgeon: Henrene Dodge, MD;  Location: [facility] ORS;  Service: General;  Laterality: N/A;   Social History:  reports that he has been smoking cigarettes. He has been smoking an average of .5 packs per day. He has never used smokeless tobacco. He reports current alcohol use of about 21.0 standard drinks of alcohol per week. He reports that he does not currently use drugs.  No Known Allergies  No family history on file.  Prior to Admission medications   Medication Sig Start Date End Date Taking? Authorizing Provider  acetaminophen (TYLENOL) 500 MG tablet Take 2 tablets (1,000 mg total) by mouth every 6 (six) hours as needed for mild pain. 05/16/22   Henrene Dodge, MD  hydrochlorothiazide (MICROZIDE) 12.5 MG capsule Take 1 capsule (12.5 mg total) by mouth daily. 05/29/22   Margarita Mail, DO  ibuprofen (ADVIL) 600 MG tablet Take 1 tablet (600 mg total) by mouth every 8 (eight) hours as needed for moderate pain. 05/16/22   Henrene Dodge, MD  lisinopril (ZESTRIL) 20 MG tablet Take 1 tablet (20 mg total) by mouth daily. 05/29/22   Margarita Mail, DO  ondansetron (ZOFRAN-ODT) 8 MG disintegrating tablet Take 1 tablet (8 mg total) by mouth every 8 (eight) hours as needed for nausea or vomiting. 07/27/22   Merwyn Katos, MD  oxyCODONE (OXY IR/ROXICODONE) 5 MG immediate release tablet  Take 1 tablet (5 mg total) by mouth every 4 (four) hours as needed for severe pain. Patient not taking: Reported on 07/27/2022 05/16/22   Henrene Dodge, MD    Physical Exam: Vitals:   07/29/22 1655 07/29/22 1700  BP:  (!) 161/111  Pulse:  84  Resp:  18  Temp:  97.7 F (36.5 C)  TempSrc:  Oral  Weight: 50 kg 50 kg  Height: 5\' 7"  (1.702 m) 5\' 7"  (1.702 m)   Neurology awake and alert  ENT with mild pallor, NG tube in place with  dark material  Cardiovascular with S1 and S2 present and rhythmic with no gallops, rubs or murmurs Respiratory with no rales or wheezing, no rhonchi Abdomen with no distention, non tender to superficial palpation, no rebound or guarding No lower extremity edema   Data Reviewed:   Na 137, K 3,8 Cl 96 bicarbonate 26, glucose 131, bun 11 cr 0,87  Lipase 124 AST 14, ALT 9 , T bilirubin 1,1  Wbc 9,4 hgb 17,5 plt 396   Urine analysis SG >1,046, negative protein, negative leukocytes.  Abdominal radiograph with NG tube below the diaphragm.   EKG 86 bpm, normal axis, normal intervals, sinus rhythm left atrial enlargement, positive PVC, no significant ST segment or T wave changes. Positive LVH.    CT with no definite peripancreatic inflammatory changes to indicate complicated pancreatitis. Persistent duodenal wall thickening and edema with abnormal rim enhancing fluid collection adjacent to the pancreatic head and proximal duodenum. Sever circumferential thickening of the distal esophagus, mild colonic diverticulosis with no diverticulitis.  4 mm right middle lobe pulmonary nodule, (sable).   Isaac Meza presents with abdominal pain for the last 7 days, history of recent episode of mild pancreatitis. Recurrent abdominal pain and not able to tolerate po, solids or liquids. Clinically hemodynamically stable, but positive dehydration. Abdominal examination with no signs of peritonitis.   CT abdomen with signs of chronic pancreatic pseudocyst with differential diagnosis of atypical duodenal diverticulum or chronic contained perforated duodenal ulcer.  No leukocytosis.  Surgery and GI have been consulted in [facility] over the phone Plan to transfer to [facility] for endoscopic [place] for further workup.    Assessment and Plan: * Pancreatitis Pancreatic pseudocyst Ileus   Plan to continue supportive medical care with as needed IV analgesics (iv hydromorphone q 4 hrs as needed) and as needed antiemetics.  Antiacid  therapy with proton pump inhibitor IV Supportive IV fluids with balance electrolyte solutions.  Keep NPO for now. Continue NG tube to low intermittent suction.  I have messaged Dr Elnoria Howard from GI.    Hypertension Patient NPO Add IV hydralazine as needed for blood pressure control.   Dehydration Add balanced electrolyte solutions with d5LR at 100 ml per hr. Continue close follow up on renal function and electrolytes.  Keep K at 4 and Mg at 2.     Advance Care Planning:   Code Status: Prior   Consults: surgery and general surgery   Family Communication: no family at the bedside   Severity of Illness: The appropriate patient status for this patient is INPATIENT. Inpatient status is judged to be reasonable and necessary in order to provide the required intensity of service to ensure the patient's safety. The patient's presenting symptoms, physical exam findings, and initial radiographic and laboratory data in the context of their chronic comorbidities is felt to place them at high risk for further clinical deterioration. Furthermore, it is not anticipated that the patient will be medically stable  for discharge from the hospital within 2 midnights of admission.   * I certify that at the point of admission it is my clinical judgment that the patient will require inpatient hospital care spanning beyond 2 midnights from the point of admission due to high intensity of service, high risk for further deterioration and high frequency of surveillance required.*  Author: Coralie Keens, MD 07/29/2022 5:04 PM  For on call review [email or web address].

## 2022-07-29 NOTE — ED Notes (Signed)
Pt wife at bedside endorsed concerns about pt still not being transferred. RN notified wife that there is no bed available yet and that we called this morning to check. Pt wife asked "Is there anywhere else he can go instead of waiting?" RN informed wife that unfortunately it is not just [facility] hospital that is at capacity and he would still have to be accepted and wait for a bed elsewhere. Pt wife asked about the suction and the fluids from NG tube coming out darker. RN explained that it is suctioning from the stomach and the color might vary. Pt wife states she has no more questions and would just like to be updated on bed status as we get them.

## 2022-07-29 NOTE — ED Notes (Signed)
called to [facility] for update/still no bed assignment/rep:[email or web address].Marland Kitchen

## 2022-07-29 NOTE — ED Notes (Signed)
Pt requesting pain medication.  

## 2022-07-29 NOTE — ED Notes (Signed)
Pt states his stomach feels like its cramping worse post pain medication administration. Pt states the morphine has not really helped. RN advised pt the MD would be notified.

## 2022-07-29 NOTE — ED Notes (Signed)
called to [facility] spoke with Isaac Meza/patient still on wait list..Marland Kitchen

## 2022-07-29 NOTE — ED Notes (Signed)
Assumed care from [facility]. Pt resting comfortably in bed at this time. Pt denies any current needs or questions. Call light with in reach.

## 2022-07-29 NOTE — ED Notes (Signed)
Called [facility] to get an update on patient bed assignment per rep.Isaac Meza bed placement they are working on getting one now that there is discharges

## 2022-07-29 NOTE — Assessment & Plan Note (Addendum)
Patient NPO Add IV hydralazine as needed for blood pressure control.

## 2022-07-29 NOTE — ED Notes (Signed)
Pt transferred to [facility] 2W. Paperwork given to [facility].

## 2022-07-30 ENCOUNTER — Ambulatory Visit: Payer: Medicaid Other | Admitting: Urology

## 2022-07-30 DIAGNOSIS — K860 Alcohol-induced chronic pancreatitis: Secondary | ICD-10-CM | POA: Diagnosis not present

## 2022-07-30 DIAGNOSIS — D649 Anemia, unspecified: Secondary | ICD-10-CM | POA: Diagnosis not present

## 2022-07-30 DIAGNOSIS — K863 Pseudocyst of pancreas: Secondary | ICD-10-CM

## 2022-07-30 DIAGNOSIS — K315 Obstruction of duodenum: Secondary | ICD-10-CM | POA: Diagnosis not present

## 2022-07-30 DIAGNOSIS — R112 Nausea with vomiting, unspecified: Secondary | ICD-10-CM

## 2022-07-30 DIAGNOSIS — K862 Cyst of pancreas: Secondary | ICD-10-CM

## 2022-07-30 DIAGNOSIS — F1010 Alcohol abuse, uncomplicated: Secondary | ICD-10-CM

## 2022-07-30 DIAGNOSIS — K259 Gastric ulcer, unspecified as acute or chronic, without hemorrhage or perforation: Secondary | ICD-10-CM | POA: Diagnosis not present

## 2022-07-30 DIAGNOSIS — E43 Unspecified severe protein-calorie malnutrition: Secondary | ICD-10-CM | POA: Diagnosis not present

## 2022-07-30 DIAGNOSIS — I10 Essential (primary) hypertension: Secondary | ICD-10-CM | POA: Diagnosis not present

## 2022-07-30 DIAGNOSIS — K861 Other chronic pancreatitis: Secondary | ICD-10-CM | POA: Diagnosis not present

## 2022-07-30 DIAGNOSIS — R935 Abnormal findings on diagnostic imaging of other abdominal regions, including retroperitoneum: Secondary | ICD-10-CM

## 2022-07-30 LAB — CBC
HCT: 45.9 % (ref 39.0–52.0)
Hemoglobin: 16.5 g/dL (ref 13.0–17.0)
MCH: 32.4 pg (ref 26.0–34.0)
MCHC: 35.9 g/dL (ref 30.0–36.0)
MCV: 90 fL (ref 80.0–100.0)
Platelets: 304 10*3/uL (ref 150–400)
RBC: 5.1 MIL/uL (ref 4.22–5.81)
RDW: 11.9 % (ref 11.5–15.5)
WBC: 11.2 10*3/uL — ABNORMAL HIGH (ref 4.0–10.5)
nRBC: 0 % (ref 0.0–0.2)

## 2022-07-30 LAB — BASIC METABOLIC PANEL
Anion gap: 11 (ref 5–15)
BUN: 12 mg/dL (ref 6–20)
CO2: 28 mmol/L (ref 22–32)
Calcium: 8.7 mg/dL — ABNORMAL LOW (ref 8.9–10.3)
Chloride: 95 mmol/L — ABNORMAL LOW (ref 98–111)
Creatinine, Ser: 0.71 mg/dL (ref 0.61–1.24)
GFR, Estimated: >60 mL/min (ref >60)
Glucose, Bld: 127 mg/dL — ABNORMAL HIGH (ref 70–99)
Potassium: 2.9 mmol/L — ABNORMAL LOW (ref 3.5–5.1)
Sodium: 134 mmol/L — ABNORMAL LOW (ref 135–145)

## 2022-07-30 LAB — POTASSIUM: Potassium: 4.6 mmol/L (ref 3.5–5.1)

## 2022-07-30 LAB — MAGNESIUM: Magnesium: 2.1 mg/dL (ref 1.7–2.4)

## 2022-07-30 MED ORDER — POTASSIUM CHLORIDE 2 MEQ/ML IV SOLN
INTRAVENOUS | Status: DC
  Filled 2022-07-30 (×8): qty 1000

## 2022-07-30 MED ORDER — POTASSIUM CHLORIDE 10 MEQ/100ML IV SOLN
10.0000 meq | INTRAVENOUS | Status: AC
  Administered 2022-07-30 (×4): 10 meq via INTRAVENOUS
  Filled 2022-07-30 (×4): qty 100

## 2022-07-30 NOTE — Progress Notes (Signed)
PROGRESS NOTE Isaac Meza  WUJ:[id number] DOB: May 18, 1970 DOA: 07/29/2022 PCP: Margarita Mail, DO  Brief Narrative/Hospital Course: 52 y.o.m w/hypertension,recent appendectomy in 05/2022 (robotic assisted laparoscopic procedure), with no apparent complications who was seen in ED 06/06/2022 for mild pancreatitis and he was treated with supportive medical therapy, he did not require hospitalization.  Now he reports 1 week of abdominal pain, epigastric in location, dull to sharp in nature, that has been persistent and worsening. He has been not able to eat due to pain, and he did not have any bowel movements, only passing gas occasionally over the last seven days. About 3 days after the onset of the abdominal pain he started to taking oral analgesics (percocet, medication left over from his prior diagnosis of pancreatitis). His pain was persistent despite oral analgesics and about 4 days later he developed vomiting and was not able to drink liquid anymore.  Patient presented to the ED at [facility] was placed on IV fluids IV and NG tube  CT abdomen with chronic pancreatic pseudocyst, with fluid collection, adjacent to the pancreatic head and duodenum. Surgery and GI were consulted and recommendations was to transfer patient to [facility] for further work up with endoscopic [place] and accepted by Dr. Elnoria Howard      Subjective: Seen and examined this morning wife at the bedside.  He reports he just had pain medication and his pain is controlled prior to that he reports he was having a lot of pain and abdomen was tender " I am in good m 6od" also   Assessment and Plan: Principal Problem:   Pancreatitis Active Problems:   Hypertension   Dehydration   Abdominal pain nausea and vomiting Pancreatitis with pancreatic pseudocyst Ileus: Likely secondary to possible pancreatic pseudocyst resulting in a small bowel obstruction at duodenal level, CT abdomen with chronic pancreatic pseudocyst with fluid collection, LFTs  normal lipase 125, mild leukocytosis but fever.   >Pain is relatively controlled on current management, continue bowel rest, aggressive fluid hydration, PPI, supportive care monitor electrolytes continue n.p.o., NG tube decompression to low intermittent suction.  Discussed with GI team tentatively plan for EUS for further evaluation of pancreatic pseudocyst on Friday.  Hypokalemia: Replace IV KCl and also add potassium and IV fluids recheck potassium later Recent Labs  Lab 07/27/22 0751 07/30/22 0426  K 3.8 2.9*  CALCIUM 9.8 8.7*  MG  --  2.1   Dehydration  Hypokalemic hyponatremia mild: UA with ketones 80, continue IV fluid hydration, supportive care.  Hypertension: BP stable  Alcohol abuse: Monitor for any signs of withdrawal reports he has recently quit  Low Body mass index is 17.26 kg/m. :  Augment diet as tolerated   DVT prophylaxis: SCDs Start: 07/29/22 1745 Code Status:   Code Status: Full Code Family Communication: plan of care discussed with patient/wife at bedside. Patient status is: Inpatient because of pancreatic pseudocyst Level of care: Med-Surg   Dispo: The patient is from: home            Anticipated disposition: TBD Objective: Vitals last 24 hrs: Vitals:   07/29/22 1700 07/29/22 2043 07/29/22 2108 07/30/22 0740  BP: (!) 161/111 (!) 154/121 (!) 128/96 (!) 128/96  Pulse: 84 88 79 86  Resp: 18   17  Temp: 97.7 F (36.5 C)   98.8 F (37.1 C)  TempSrc: Oral   Oral  SpO2:    99%  Weight: 50 kg     Height:  (1.702 m)      Weight  change:   Physical Examination:  General exam: alert awake,older than stated age HEENT:Oral mucosa moist, Ear/Nose WNL grossly Respiratory system: bilaterally clear BS, no use of accessory muscle Cardiovascular system: S1 & S2 +, No JVD. Gastrointestinal system: Abdomen soft, mildly tender but just had pain medication,ND, BS+ Nervous System:Alert, awake, moving extremities. Extremities: LE edema neg,distal peripheral  pulses palpable.  Skin: No rashes,no icterus. MSK: Normal muscle bulk,tone, power  Medications reviewed:  Scheduled Meds:  pantoprazole (PROTONIX) IV  40 mg Intravenous Q24H  Continuous Infusions:  dextrose 5% lactated ringers 1000 ml with potassium chloride 30 mEq/L 100 mL/hr at 07/30/22 1012   potassium chloride 10 mEq (07/30/22 1013)    Diet Order             Diet NPO time specified  Diet effective now                  Intake/Output Summary (Last 24 hours) at 07/30/2022 1125 Last data filed at 07/30/2022 0530 Gross per 24 hour  Intake --  Output 2000 ml  Net -2000 ml   Net IO Since Admission: -2,000 mL [07/30/22 1125]  Wt Readings from Last 3 Encounters:  07/29/22 50 kg  07/27/22 54.4 kg  07/02/22 60.4 kg     Unresulted Labs (From admission, onward)    None     Data Reviewed: I have personally reviewed following labs and imaging studies CBC: Recent Labs  Lab 07/27/22 0751 07/30/22 0426  WBC 9.4 11.2*  HGB 17.5* 16.5  HCT 50.2 45.9  MCV 91.4 90.0  PLT 396 304   Basic Metabolic Panel: Recent Labs  Lab 07/27/22 0751 07/30/22 0426  NA 137 134*  K 3.8 2.9*  CL 96* 95*  CO2 26 28  GLUCOSE 131* 127*  BUN 11 12  CREATININE 0.87 0.71  CALCIUM 9.8 8.7*  MG  --  2.1   GFR: Estimated Creatinine Clearance: 79 mL/min (by C-G formula based on SCr of 0.71 mg/dL). Liver Function Tests: Recent Labs  Lab 07/27/22 0751  AST 14*  ALT 9  ALKPHOS 89  BILITOT 1.1  PROT 7.8  ALBUMIN 4.5   Recent Labs  Lab 07/27/22 0751  LIPASE 124*    No results found for this or any previous visit (from the past 240 hour(s)).  Antimicrobials: Anti-infectives (From admission, onward)    None      Culture/Microbiology No results found for: "SDES", "SPECREQUEST", "CULT", "REPTSTATUS"   Radiology Studies: No results found.   LOS: 1 day   Lanae Boast, MD [facility]  07/30/2022, 11:25 AM

## 2022-07-30 NOTE — Plan of Care (Signed)
Patient AOX4, VSS throughout shift. Diminished lungs, IS taught and encouraged. All meds given on time as ordered. Pt c/o pain relieved by PRN dilaudid. NGT to decompress, 56cm at nare. Output large and dark brown in color. Remains NPO. Pt voided in urinal. POC maintained, will continue to monitor.   Problem: Education: Goal: Knowledge of General Education information will improve Description: Including pain rating scale, medication(s)/side effects and non-pharmacologic comfort measures Outcome: Progressing   Problem: Health Behavior/Discharge Planning: Goal: Ability to manage health-related needs will improve Outcome: Progressing   Problem: Clinical Measurements: Goal: Ability to maintain clinical measurements within normal limits will improve Outcome: Progressing Goal: Will remain free from infection Outcome: Progressing Goal: Diagnostic test results will improve Outcome: Progressing Goal: Respiratory complications will improve Outcome: Progressing Goal: Cardiovascular complication will be avoided Outcome: Progressing   Problem: Activity: Goal: Risk for activity intolerance will decrease Outcome: Progressing   Problem: Nutrition: Goal: Adequate nutrition will be maintained Outcome: Progressing   Problem: Coping: Goal: Level of anxiety will decrease Outcome: Progressing   Problem: Elimination: Goal: Will not experience complications related to bowel motility Outcome: Progressing Goal: Will not experience complications related to urinary retention Outcome: Progressing   Problem: Pain Managment: Goal: General experience of comfort will improve Outcome: Progressing   Problem: Safety: Goal: Ability to remain free from injury will improve Outcome: Progressing   Problem: Skin Integrity: Goal: Risk for impaired skin integrity will decrease Outcome: Progressing   

## 2022-07-30 NOTE — Consult Note (Addendum)
Consultation  Referring Provider:  [facility]  Primary Care Physician:  Margarita Mail, DO Primary Gastroenterologist:  Gentry Fitz       Reason for Consultation:  Pancreatic pseudocyst     LOS: 1 day          HPI:   Isaac Meza is a 52 y.o. male with past medical history significant for recurrent pancreatitis presents for evaluation of possible pancreatic pseudocyst.  Patient presented to [facility] with abdominal pain, nausea, and vomiting. CT showed persistent duodenal wall thickening/edema representing possible chronic pancreatic pseudocyst, atypical duodenal diverticulum, or chronic contained perforated duodenal ulcer. Possible esophagitis.  Patient states Monday 4/15 he woke up "not feeling well" and this became progressively worsened throughout the week. States each day he was having generalized severe abdominal pain that radiated through epigastrium to his back. This began waking him from sleep Thursday 4/18. Due to pain he wasn't eating well. Reports taking percocet that was leftover from previous episode of pancreatitis 06/2022. Then on Sunday 4/21 he began having nausea and vomiting and was unable to keep food down. Reports nonbloody bilious vomiting. Patient reports unintentional weight loss of 36lbs (went from 146lbs to 110lbs) since appendectomy 05/2022. Denies family history of colon cancer. Reports daily alcohol use of about 3 shots of liquor per day for the last 5 years (states he drank more previously). Reports 1/2 pack/day.  No family was present at the time of my evaluation.  Past Medical History:  Diagnosis Date   Asthma    childhood   Hypertension     Surgical History:  He  has a past surgical history that includes Fracture surgery (Right, 2007); Back surgery; and Xi robotic laparoscopic assisted appendectomy (N/A, 05/16/2022). Family History:  His family history is not on file. Social History:   reports that he has been smoking cigarettes. He has been smoking an  average of .5 packs per day. He has never used smokeless tobacco. He reports current alcohol use of about 21.0 standard drinks of alcohol per week. He reports that he does not currently use drugs.  Prior to Admission medications   Medication Sig Start Date End Date Taking? Authorizing Provider  acetaminophen (TYLENOL) 500 MG tablet Take 2 tablets (1,000 mg total) by mouth every 6 (six) hours as needed for mild pain. 05/16/22  Yes Piscoya, Elita Quick, MD  hydrochlorothiazide (MICROZIDE) 12.5 MG capsule Take 1 capsule (12.5 mg total) by mouth daily. 05/29/22  Yes Margarita Mail, DO  ibuprofen (ADVIL) 600 MG tablet Take 1 tablet (600 mg total) by mouth every 8 (eight) hours as needed for moderate pain. 05/16/22  Yes Piscoya, Elita Quick, MD  lisinopril (ZESTRIL) 20 MG tablet Take 1 tablet (20 mg total) by mouth daily. 05/29/22  Yes Margarita Mail, DO  ondansetron (ZOFRAN-ODT) 8 MG disintegrating tablet Take 1 tablet (8 mg total) by mouth every 8 (eight) hours as needed for nausea or vomiting. 07/27/22  Yes Bradler, Clent Jacks, MD  oxyCODONE (OXY IR/ROXICODONE) 5 MG immediate release tablet Take 1 tablet (5 mg total) by mouth every 4 (four) hours as needed for severe pain. Patient not taking: Reported on 07/27/2022 05/16/22   Henrene Dodge, MD    Current Facility-Administered Medications  Medication Dose Route Frequency Provider Last Rate Last Admin   acetaminophen (TYLENOL) tablet 650 mg  650 mg Per Tube Q6H PRN Arrien, York Ram, MD       Or   acetaminophen (TYLENOL) suppository 650 mg  650 mg Rectal Q6H PRN Arrien,  York Ram, MD       dextrose 5% lactated ringers 1,000 mL with potassium chloride 30 mEq/L Pediatric IV infusion   Intravenous Continuous Kc, Ramesh, MD       hydrALAZINE (APRESOLINE) injection 10 mg  10 mg Intravenous Q6H PRN Arrien, York Ram, MD   10 mg at 07/29/22 2100   HYDROmorphone (DILAUDID) injection 1 mg  1 mg Intravenous Q2H PRN Arrien, York Ram, MD   1 mg at 07/30/22  0533   ondansetron (ZOFRAN) tablet 4 mg  4 mg Per Tube Q6H PRN Arrien, York Ram, MD       Or   ondansetron [facility]) injection 4 mg  4 mg Intravenous Q6H PRN Arrien, York Ram, MD       pantoprazole (PROTONIX) injection 40 mg  40 mg Intravenous Q24H Coralie Keens, MD   40 mg at 07/29/22 2128   potassium chloride 10 mEq in 100 mL IVPB  10 mEq Intravenous Q1 Hr x 4 Kc, Ramesh, MD        Allergies as of 07/27/2022   (No Known Allergies)    Review of Systems  Constitutional:  Positive for weight loss. Negative for chills, fever and malaise/fatigue.  HENT:  Negative for hearing loss and tinnitus.   Eyes:  Negative for blurred vision and double vision.  Respiratory:  Negative for cough and hemoptysis.   Cardiovascular:  Negative for chest pain and palpitations.  Gastrointestinal:  Positive for abdominal pain, nausea and vomiting. Negative for blood in stool, constipation, diarrhea, heartburn and melena.  Genitourinary:  Negative for dysuria and urgency.  Musculoskeletal:  Negative for myalgias and neck pain.  Skin:  Negative for itching and rash.  Neurological:  Negative for seizures and loss of consciousness.  Psychiatric/Behavioral:  Negative for depression and suicidal ideas.        Physical Exam:  Vital signs in last 24 hours: Temp:  [97.7 F (36.5 C)-98.8 F (37.1 C)] 98.8 F (37.1 C) (04/24 0740) Pulse Rate:  [65-96] 86 (04/24 0740) Resp:  [15-18] 17 (04/24 0740) BP: (128-161)/(84-121) 128/96 (04/24 0740) SpO2:  [91 %-99 %] 99 % (04/24 0740) Weight:  [50 kg] 50 kg (04/23 1700) Last BM Date :  (PTA) Last BM recorded by nurses in past 5 days No data recorded  Physical Exam Constitutional:      Appearance: He is ill-appearing.     Comments: Frail, very thin, malnourished  HENT:     Head: Normocephalic and atraumatic.     Nose:     Comments: NG tube with dark brown output about 300cc    Mouth/Throat:     Mouth: Mucous membranes are moist.      Pharynx: Oropharynx is clear.  Eyes:     Extraocular Movements: Extraocular movements intact.     Conjunctiva/sclera: Conjunctivae normal.  Cardiovascular:     Rate and Rhythm: Normal rate and regular rhythm.  Pulmonary:     Effort: Pulmonary effort is normal. No respiratory distress.  Abdominal:     General: Bowel sounds are normal. There is no distension.     Palpations: Abdomen is soft. There is no mass.     Tenderness: There is abdominal tenderness (epigastric). There is no guarding or rebound.     Hernia: No hernia is present.  Musculoskeletal:        General: No swelling. Normal range of motion.     Cervical back: Normal range of motion and neck supple.  Skin:    General: Skin  is warm and dry.     Coloration: Skin is not jaundiced.  Neurological:     General: No focal deficit present.     Mental Status: He is oriented to person, place, and time.  Psychiatric:        Mood and Affect: Mood normal.        Behavior: Behavior normal.        Thought Content: Thought content normal.        Judgment: Judgment normal.      LAB RESULTS: Recent Labs    07/30/22 0426  WBC 11.2*  HGB 16.5  HCT 45.9  PLT 304    BMET Recent Labs    07/30/22 0426  NA 134*  K 2.9*  CL 95*  CO2 28  GLUCOSE 127*  BUN 12  CREATININE 0.71  CALCIUM 8.7*    LFT No results for input(s): "PROT", "ALBUMIN", "AST", "ALT", "ALKPHOS", "BILITOT", "BILIDIR", "IBILI" in the last 72 hours.  PT/INR No results for input(s): "LABPROT", "INR" in the last 72 hours.  STUDIES: No results found.    Impression    Abdominal pain, nausea, vomiting; likely secondary to possible pancreatic pseudocyst resulting in small bowel obstruction at duodenal level - CT abdomen with chronic pancreatic pseudocyst, with fluid collection, adjacent to the pancreatic head and duodenum  - normal LFTs - lipase 124 - no leukocytosis - NG tube placed Patient currently stable hemodynamically and  symptomatically   Alcohol abuse Hypertension    Plan   - Recommend EUS for further evaluation of pancreatic pseudocyst, planned for Friday 4/26 I thoroughly discussed the procedure with the patient (at bedside) to include nature of the procedure, alternatives, benefits, and risks (including but not limited to bleeding, infection, perforation, anesthesia/cardiac pulmonary complications).  Patient verbalized understanding and gave verbal consent to proceed with EGD/Colonoscopy.  - Continue pain control - Continue supportive care  Thank you for your kind consultation, we will continue to follow.   Valbona Slabach Leanna Sato  07/30/2022, 8:41 AM

## 2022-07-30 NOTE — Hospital Course (Addendum)
52 y.o. male with past medical history of/hypertension,recent appendectomy in 05/2022 (robotic assisted laparoscopic procedure), w presented to the ED on 06/06/2022 for mild pancreatitis and he was treated with supportive medical therapy, he did not require hospitalization.  Subsequently he had 1 week of 1 week of abdominal pain, epigastric in location, dull to sharp in nature, that has been persistent and worsening.  He was unable to eat and was constipated.  He took some oral analgesics but his pain persisted with vomiting and decreased tolerance to oral feeds so he presented to ED [facility].  Patient was put on IV fluids IV and NG tube initially and CT scan of the abdomen was performed which showed chronic pancreatic pseudocyst, with fluid collection, adjacent to the pancreatic head and duodenum. Surgery and GI were consulted and recommendations was to transfer patient to [facility] for further work up with endoscopic [place].  Patient has undergone endoscopic ultrasound and aspiration of the cystic lesion w/ 200 cc aspiration and sent for  analysis

## 2022-07-31 DIAGNOSIS — D649 Anemia, unspecified: Secondary | ICD-10-CM | POA: Diagnosis not present

## 2022-07-31 DIAGNOSIS — K863 Pseudocyst of pancreas: Secondary | ICD-10-CM | POA: Diagnosis not present

## 2022-07-31 DIAGNOSIS — K861 Other chronic pancreatitis: Secondary | ICD-10-CM | POA: Diagnosis not present

## 2022-07-31 DIAGNOSIS — K860 Alcohol-induced chronic pancreatitis: Secondary | ICD-10-CM | POA: Diagnosis not present

## 2022-07-31 DIAGNOSIS — E43 Unspecified severe protein-calorie malnutrition: Secondary | ICD-10-CM | POA: Diagnosis not present

## 2022-07-31 DIAGNOSIS — K259 Gastric ulcer, unspecified as acute or chronic, without hemorrhage or perforation: Secondary | ICD-10-CM | POA: Diagnosis not present

## 2022-07-31 DIAGNOSIS — K315 Obstruction of duodenum: Secondary | ICD-10-CM

## 2022-07-31 DIAGNOSIS — I10 Essential (primary) hypertension: Secondary | ICD-10-CM | POA: Diagnosis not present

## 2022-07-31 LAB — COMPREHENSIVE METABOLIC PANEL
ALT: 10 U/L (ref 0–44)
AST: 11 U/L — ABNORMAL LOW (ref 15–41)
Albumin: 3.1 g/dL — ABNORMAL LOW (ref 3.5–5.0)
Alkaline Phosphatase: 59 U/L (ref 38–126)
Anion gap: 9 (ref 5–15)
BUN: 15 mg/dL (ref 6–20)
CO2: 32 mmol/L (ref 22–32)
Calcium: 9.1 mg/dL (ref 8.9–10.3)
Chloride: 98 mmol/L (ref 98–111)
Creatinine, Ser: 0.8 mg/dL (ref 0.61–1.24)
GFR, Estimated: >60 mL/min (ref >60)
Glucose, Bld: 117 mg/dL — ABNORMAL HIGH (ref 70–99)
Potassium: 4.5 mmol/L (ref 3.5–5.1)
Sodium: 139 mmol/L (ref 135–145)
Total Bilirubin: 0.9 mg/dL (ref 0.3–1.2)
Total Protein: 5.9 g/dL — ABNORMAL LOW (ref 6.5–8.1)

## 2022-07-31 LAB — CBC
HCT: 44.8 % (ref 39.0–52.0)
Hemoglobin: 15 g/dL (ref 13.0–17.0)
MCH: 31.6 pg (ref 26.0–34.0)
MCHC: 33.5 g/dL (ref 30.0–36.0)
MCV: 94.3 fL (ref 80.0–100.0)
Platelets: 288 10*3/uL (ref 150–400)
RBC: 4.75 MIL/uL (ref 4.22–5.81)
RDW: 12 % (ref 11.5–15.5)
WBC: 10.5 10*3/uL (ref 4.0–10.5)
nRBC: 0 % (ref 0.0–0.2)

## 2022-07-31 LAB — MAGNESIUM: Magnesium: 2.1 mg/dL (ref 1.7–2.4)

## 2022-07-31 MED ORDER — SODIUM CHLORIDE 0.9% FLUSH
10.0000 mL | Freq: Two times a day (BID) | INTRAVENOUS | Status: DC
  Administered 2022-07-31 – 2022-08-04 (×10): 10 mL

## 2022-07-31 MED ORDER — SODIUM CHLORIDE 0.9% FLUSH: 10.0000 mL | INTRAVENOUS | Status: DC | PRN

## 2022-07-31 NOTE — Plan of Care (Signed)
Patient AOX4, VSS throughout shift. Diminished lungs, IS taught and encouraged. All meds given on time as ordered. Pt c/o pain relieved by PRN dilaudid. NGT to decompress, 56cm at nare. Output large and dark brown in color. Remains NPO. Pt voided in urinal. POC maintained, will continue to monitor.   Problem: Education: Goal: Knowledge of General Education information will improve Description: Including pain rating scale, medication(s)/side effects and non-pharmacologic comfort measures Outcome: Progressing   Problem: Health Behavior/Discharge Planning: Goal: Ability to manage health-related needs will improve Outcome: Progressing   Problem: Clinical Measurements: Goal: Ability to maintain clinical measurements within normal limits will improve Outcome: Progressing Goal: Will remain free from infection Outcome: Progressing Goal: Diagnostic test results will improve Outcome: Progressing Goal: Respiratory complications will improve Outcome: Progressing Goal: Cardiovascular complication will be avoided Outcome: Progressing   Problem: Activity: Goal: Risk for activity intolerance will decrease Outcome: Progressing   Problem: Nutrition: Goal: Adequate nutrition will be maintained Outcome: Progressing   Problem: Coping: Goal: Level of anxiety will decrease Outcome: Progressing   Problem: Elimination: Goal: Will not experience complications related to bowel motility Outcome: Progressing Goal: Will not experience complications related to urinary retention Outcome: Progressing   Problem: Pain Managment: Goal: General experience of comfort will improve Outcome: Progressing   Problem: Safety: Goal: Ability to remain free from injury will improve Outcome: Progressing   Problem: Skin Integrity: Goal: Risk for impaired skin integrity will decrease Outcome: Progressing

## 2022-07-31 NOTE — Progress Notes (Signed)
PROGRESS NOTE Isaac Meza  ZOX:[id number] DOB: [birth year] DOA: 07/29/2022 PCP: Margarita Mail, DO  Brief Narrative/Hospital Course: 52 y.o.m w/hypertension,recent appendectomy in 05/2022 (robotic assisted laparoscopic procedure), with no apparent complications who was seen in ED 06/06/2022 for mild pancreatitis and he was treated with supportive medical therapy, he did not require hospitalization.  Now he reports 1 week of abdominal pain, epigastric in location, dull to sharp in nature, that has been persistent and worsening. He has been not able to eat due to pain, and he did not have any bowel movements, only passing gas occasionally over the last seven days. About 3 days after the onset of the abdominal pain he started to taking oral analgesics (percocet, medication left over from his prior diagnosis of pancreatitis). His pain was persistent despite oral analgesics and about 4 days later he developed vomiting and was not able to drink liquid anymore.  Patient presented to the ED at [facility] was placed on IV fluids IV and NG tube  CT abdomen with chronic pancreatic pseudocyst, with fluid collection, adjacent to the pancreatic head and duodenum. Surgery and GI were consulted and recommendations was to transfer patient to [facility] for further work up with endoscopic [place] and accepted by Dr. Elnoria Howard  Subjective: Patient seen and examined this morning. 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  Continue NG decompression suctioning, IV fluid hydration pain managed with oral/IV opiates.  LFTs normal lipase 125 on admission with mild leukocytosis but no fever> leukocytosis resolved 4/25 > Appreciate GI inputs on board planning for EGD with EUS 4/26.    Hypokalemia: Resolved after aggressive replacement, cont ivf w/ kcl, monitor electrolytes Recent Labs  Lab 07/27/22 0751 07/30/22 0426 07/30/22 1756 07/31/22 0558  K 3.8 2.9* 4.6 4.5  CALCIUM 9.8 8.7*  --  9.1  MG  --  2.1  --  2.1    Dehydration  Hypochloremic hyponatremia: Due to #1,.UA with ketones 80, continue IV fluid hydration, supportive care.  Hypertension: BP stable.  Holding home HCTZ and lisinopril  Alcohol abuse: Monitor for any signs of withdrawal reports he has recently quit  Low Body mass index is 17.26 kg/m. :  Augment diet as tolerated   DVT prophylaxis: SCDs Start: 07/29/22 1745 Code Status:   Code Status: Full Code Family Communication: plan of care discussed with patient/wife at bedside. Patient status is: Inpatient because of pancreatic pseudocyst Level of care: Med-Surg   Dispo: The patient is from: home            Anticipated disposition: TBD Objective: Vitals last 24 hrs: Vitals:   07/30/22 0740 07/30/22 1746 07/30/22 1830 07/31/22 0431  BP: (!) 128/96 (!) 138/102 110/69 133/80  Pulse: 86   79  Resp: 17   15  Temp: 98.8 F (37.1 C)   98.2 F (36.8 C)  TempSrc: Oral   Oral  SpO2: 99%   99%  Weight:      Height:       Weight change:   Physical Examination: General exam: alert awake, oriented, older than stated age HEENT:Oral mucosa moist, Ear/Nose WNL grossly Respiratory system: Bilaterally clear BS, no use of accessory muscle Cardiovascular system: S1 & S2 +, No JVD. Gastrointestinal system:  NGT+Abdomen soft, tenderness on the mid abdomen  ,ND, BS+ Nervous System: Alert, awake, moving extremities, he follows commands. Extremities: LE edema neg,distal peripheral pulses palpable.  Skin: No rashes,no icterus. MSK: Normal muscle bulk,tone, power  Medications reviewed:  Scheduled Meds:  pantoprazole (PROTONIX) IV  40 mg Intravenous Q24H  Continuous Infusions:  dextrose 5% lactated ringers 1000 ml with potassium chloride 30 mEq/L Stopped (07/30/22 1453)    Diet Order             Diet NPO time specified  Diet effective now                  Intake/Output Summary (Last 24 hours) at 07/31/2022 0840 Last data filed at 07/31/2022 0225 Gross per 24 hour  Intake 136.84 ml  Output 3150 ml  Net -3013.16 ml    Net IO Since Admission: -5,013.16 mL [07/31/22 0840]  Wt Readings from Last 3 Encounters:  07/29/22 50 kg  07/27/22 54.4 kg  07/02/22 60.4 kg     Unresulted Labs (From admission, onward)     Start     Ordered   07/31/22 0500  CBC  Daily,   R     Question:  Specimen collection method  Answer:  Lab=Lab collect   07/30/22 1127   07/31/22 0500  Comprehensive metabolic panel  Daily,   R     Question:  Specimen collection method  Answer:  Lab=Lab collect   07/30/22 1127          Data Reviewed: I have personally reviewed following labs and imaging studies CBC: Recent Labs  Lab 07/27/22 0751 07/30/22 0426 07/31/22 0558  WBC 9.4 11.2* 10.5  HGB 17.5* 16.5 15.0  HCT 50.2 45.9 44.8  MCV 91.4 90.0 94.3  PLT 396 304 288    Basic Metabolic Panel: Recent Labs  Lab 07/27/22 0751 07/30/22 0426 07/30/22 1756 07/31/22 0558  NA 137 134*  --  139  K 3.8 2.9* 4.6 4.5  CL 96* 95*  --  98  CO2 26 28  --  32  GLUCOSE 131* 127*  --  117*  BUN 11 12  --  15  CREATININE 0.87 0.71  --  0.80  CALCIUM 9.8 8.7*  --  9.1  MG  --  2.1  --  2.1    GFR: Estimated Creatinine Clearance: 79 mL/min (by C-G formula based on SCr of 0.8 mg/dL). Liver Function Tests: Recent Labs  Lab 07/27/22 0751 07/31/22 0558  AST  14* 11*  ALT 9 10  ALKPHOS 89 59  BILITOT 1.1 0.9  PROT 7.8 5.9*  ALBUMIN 4.5 3.1*    Recent Labs  Lab 07/27/22 0751  LIPASE 124*     No results found for this or any previous visit (from the past 240 hour(s)).  Antimicrobials: Anti-infectives (From admission, onward)    None      Culture/Microbiology No results found for: "SDES", "SPECREQUEST", "CULT", "REPTSTATUS"   Radiology Studies: No results found.   LOS: 2 days   Lanae Boast, MD [facility]  07/31/2022, 8:40 AM

## 2022-07-31 NOTE — Progress Notes (Signed)
    Progress Note  Primary GI: Unassigned  LOS: 2 days   Chief Complaint: Pancreatic pseudocyst   Subjective  Patient states he is continuing to have abdominal pain.  Denies nausea/vomiting.  Tolerating sips of water and would really like to do clear liquids if possible.  900 cc dark fluid in NG canister noted. Per chart, 3150 output in last 24 hrs  Patient with family at bedside,  . Provided some of the history.    Objective   Vital signs in last 24 hours: Temp:  [97.8 F (36.6 C)-98.6 F (37 C)] 97.8 F (36.6 C) (04/25 0831) Pulse Rate:  [73-83] 73 (04/25 0831) Resp:  [15-17] 15 (04/25 0431) BP: (110-149)/(69-106) 119/90 (04/25 0831) SpO2:  [97 %-100 %] 100 % (04/25 0831) Last BM Date :  (PTA) Last BM recorded by nurses in past 5 days No data recorded  General:   male in no acute distress  Heart:  Regular rate and rhythm; no murmurs Pulm: Clear anteriorly; no wheezing Abdomen: soft, nondistended, normal bowel sounds in all quadrants. Nontender without guarding. No organomegaly appreciated. Extremities:  No edema Neurologic:  Alert and  oriented x4;  No focal deficits.  Psych:  Cooperative. Normal mood and affect.  Intake/Output from previous day: 04/24 0701 - 04/25 0700 In: 136.8 [I.V.:136.8] Out: 3150 [Emesis/NG output:3150] Intake/Output this shift: No intake/output data recorded.  Studies/Results: No results found.  Lab Results: Recent Labs    07/30/22 0426 07/31/22 0558  WBC 11.2* 10.5  HGB 16.5 15.0  HCT 45.9 44.8  PLT 304 288   BMET Recent Labs    07/30/22 0426 07/30/22 1756 07/31/22 0558  NA 134*  --  139  K 2.9* 4.6 4.5  CL 95*  --  98  CO2 28  --  32  GLUCOSE 127*  --  117*  BUN 12  --  15  CREATININE 0.71  --  0.80  CALCIUM 8.7*  --  9.1   LFT Recent Labs    07/31/22 0558  PROT 5.9*  ALBUMIN 3.1*  AST 11*  ALT 10  ALKPHOS 59  BILITOT 0.9   PT/INR No results for input(s): "LABPROT", "INR" in the last 72 hours.   Scheduled  Meds:  pantoprazole (PROTONIX) IV  40 mg Intravenous Q24H   Continuous Infusions:  dextrose 5% lactated ringers 1000 ml with potassium chloride 30 mEq/L Stopped (07/30/22 1453)     Impression:   Duodenal obstruction secondary to pancreatic pseudocyst  - CT abdomen with chronic 3.9 x 4.0 x 5.7 cm pancreatic pseudocyst, with fluid collection, adjacent to the pancreatic head and duodenum. Differential at the level of the duodenum also includes duodenal diverticulum and contained perforated duodenal ulcer  - normal LFTs - lipase 124 - no leukocytosis - NG tube placed, 3150 output in the last 24 hours Patient currently stable hemodynamically   Alcohol abuse Hypertension   Plan:   -Plan for EUS tomorrow -Continue PPI twice daily -Continue supportive care - NPO midnight  Romy Ipock Leanna Sato  07/31/2022, 8:48 AM

## 2022-07-31 NOTE — Progress Notes (Signed)
Initial Nutrition Assessment  DOCUMENTATION CODES:   Severe malnutrition in context of chronic illness, Underweight  INTERVENTION:  Recommend TPN initiation if unable to advance or tolerate diet following EGD Monitor for diet advancement and will add nutrition supplements to optimize PO intake Pt is at moderate refeeding risk, if TPN indicated recommend monitoring potassium, magnesium and phosphorus BID for at least 3 days  NUTRITION DIAGNOSIS:   Severe Malnutrition related to chronic illness (pancreatitis, recent appendectomy) as evidenced by severe fat depletion, moderate fat depletion, severe muscle depletion, percent weight loss.  GOAL:   Patient will meet greater than or equal to 90% of their needs  MONITOR:   Diet advancement, Labs, Weight trends, I & O's  REASON FOR ASSESSMENT:   Malnutrition Screening Tool    ASSESSMENT:   Admitted with 1 week of persistent and worsening epigastric pain r/t pancreatitis with pseudocysts. PMH significant for HTN, recent appendectomy (05/2022).   NG to LIS. Pt wanting to try clear liquids. Continues with abdominal pain.   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 Plans for EUS tomorrow, supportive care for now.  Spoke with pt at bedside. His wife also present during half of visit. He recalls a baseline PO intake of about 2 meals per day. He states "this works for my body."   Though he reports a significant weight loss since his appendectomy in February, he denies changes to his baseline PO intake until a week ago Monday. He wasn't feeling well so he ate what he could that day. Wednesday he recalled eating fish sticks and salad for dinner and began vomiting late in the night. He was unable to tolerate any PO intake Friday.    Pt reports a weight of 146 lbs about 3 months ago at his first MD visit. On Saturday he reports his weight was 121 lbs when weighed. Believes that he has lost more weight since that time.    Reviewed weight history. Pt's weight noted to decline from 64.4 kg on 01/24 down to 50 kg this admission. This is a weight loss of 22.4% within the last 3 months which is clinically significant for time frame.   Pt reports working for a [employer] and feels as though he is generally very active and strong however has noticed a decline in his muscle mass which is consistent with findings on nutrition focused physical exam.   Based on significant weight loss and moderate-severe fat and muscle wasting, this is suggestive of more chronic undernutrition.   Medications: IV protonix  Labs: reviewed  NGT to LIS: x24 hours +1014ml x2 hours Pt reports drinking water to help with dry mouth, which is likely increasing output.   NUTRITION - FOCUSED PHYSICAL EXAM:  Flowsheet Row Most Recent Value  Orbital Region Severe depletion  Upper Arm Region Severe depletion  Thoracic and Lumbar Region Moderate depletion  Buccal Region Moderate depletion  Temple Region Moderate depletion  Clavicle Bone Region Severe depletion  Clavicle and Acromion Bone Region Severe depletion  Scapular Bone Region Severe depletion  Dorsal Hand No depletion  Patellar Region Severe depletion  Anterior Thigh Region Severe depletion  Posterior Calf Region Moderate depletion  Edema (RD Assessment) None  Hair Reviewed  Eyes Reviewed  Mouth Reviewed  Skin Reviewed  Nails Reviewed      Diet Order:   Diet Order             Diet NPO time specified  Diet effective now  EDUCATION NEEDS:   Education needs have been addressed  Skin:  Skin Assessment: Reviewed RN Assessment  Last BM:  unknown  Height:   Ht Readings from Last 1 Encounters:  07/29/22  (1.702 m)    Weight:   Wt Readings from Last 1 Encounters:  07/29/22 50 kg    Ideal Body Weight:  67.3 kg  BMI:  Body mass index is 17.26 kg/m.  Estimated Nutritional Needs:   Kcal:  1900-2100  Protein:   95-110g  Fluid:  >/=1.9L  Drusilla Kanner, RDN, LDN Clinical Nutrition

## 2022-08-01 ENCOUNTER — Inpatient Hospital Stay (HOSPITAL_COMMUNITY): Payer: Medicaid Other | Admitting: Certified Registered Nurse Anesthetist

## 2022-08-01 ENCOUNTER — Encounter (HOSPITAL_COMMUNITY): Payer: Self-pay | Admitting: Internal Medicine

## 2022-08-01 ENCOUNTER — Encounter (HOSPITAL_COMMUNITY)
Disposition: A | Discharge status: Home / Self Care | Payer: Self-pay | Source: Other Acute Inpatient Hospital | Attending: Internal Medicine

## 2022-08-01 DIAGNOSIS — E43 Unspecified severe protein-calorie malnutrition: Secondary | ICD-10-CM | POA: Insufficient documentation

## 2022-08-01 DIAGNOSIS — J45909 Unspecified asthma, uncomplicated: Secondary | ICD-10-CM | POA: Diagnosis not present

## 2022-08-01 DIAGNOSIS — I10 Essential (primary) hypertension: Secondary | ICD-10-CM | POA: Diagnosis not present

## 2022-08-01 DIAGNOSIS — K862 Cyst of pancreas: Secondary | ICD-10-CM | POA: Diagnosis not present

## 2022-08-01 DIAGNOSIS — K922 Gastrointestinal hemorrhage, unspecified: Secondary | ICD-10-CM | POA: Diagnosis not present

## 2022-08-01 DIAGNOSIS — R933 Abnormal findings on diagnostic imaging of other parts of digestive tract: Secondary | ICD-10-CM | POA: Diagnosis not present

## 2022-08-01 DIAGNOSIS — R590 Localized enlarged lymph nodes: Secondary | ICD-10-CM

## 2022-08-01 DIAGNOSIS — F17200 Nicotine dependence, unspecified, uncomplicated: Secondary | ICD-10-CM | POA: Diagnosis not present

## 2022-08-01 DIAGNOSIS — F17210 Nicotine dependence, cigarettes, uncomplicated: Secondary | ICD-10-CM

## 2022-08-01 DIAGNOSIS — K863 Pseudocyst of pancreas: Secondary | ICD-10-CM | POA: Diagnosis not present

## 2022-08-01 DIAGNOSIS — K861 Other chronic pancreatitis: Secondary | ICD-10-CM | POA: Diagnosis not present

## 2022-08-01 HISTORY — PX: EUS: SHX5427

## 2022-08-01 HISTORY — PX: FINE NEEDLE ASPIRATION: SHX5430

## 2022-08-01 HISTORY — PX: ESOPHAGOGASTRODUODENOSCOPY (EGD) WITH PROPOFOL: SHX5813

## 2022-08-01 LAB — COMPREHENSIVE METABOLIC PANEL
ALT: 20 U/L (ref 0–44)
AST: 21 U/L (ref 15–41)
Albumin: 3 g/dL — ABNORMAL LOW (ref 3.5–5.0)
Alkaline Phosphatase: 63 U/L (ref 38–126)
Anion gap: 9 (ref 5–15)
BUN: 16 mg/dL (ref 6–20)
CO2: 30 mmol/L (ref 22–32)
Calcium: 9 mg/dL (ref 8.9–10.3)
Chloride: 101 mmol/L (ref 98–111)
Creatinine, Ser: 0.91 mg/dL (ref 0.61–1.24)
GFR, Estimated: >60 mL/min (ref >60)
Glucose, Bld: 114 mg/dL — ABNORMAL HIGH (ref 70–99)
Potassium: 4.9 mmol/L (ref 3.5–5.1)
Sodium: 140 mmol/L (ref 135–145)
Total Bilirubin: 1 mg/dL (ref 0.3–1.2)
Total Protein: 6 g/dL — ABNORMAL LOW (ref 6.5–8.1)

## 2022-08-01 LAB — CBC
HCT: 42.9 % (ref 39.0–52.0)
Hemoglobin: 14.2 g/dL (ref 13.0–17.0)
MCH: 31.8 pg (ref 26.0–34.0)
MCHC: 33.1 g/dL (ref 30.0–36.0)
MCV: 96 fL (ref 80.0–100.0)
Platelets: 297 10*3/uL (ref 150–400)
RBC: 4.47 MIL/uL (ref 4.22–5.81)
RDW: 12 % (ref 11.5–15.5)
WBC: 11.2 10*3/uL — ABNORMAL HIGH (ref 4.0–10.5)
nRBC: 0 % (ref 0.0–0.2)

## 2022-08-01 SURGERY — ESOPHAGOGASTRODUODENOSCOPY (EGD) WITH PROPOFOL
Anesthesia: General

## 2022-08-01 MED ORDER — ONDANSETRON HCL 4 MG/2ML IJ SOLN
INTRAMUSCULAR | Status: DC | PRN
  Administered 2022-08-01: 4 mg via INTRAVENOUS

## 2022-08-01 MED ORDER — DEXTROSE-NACL 5-0.9 % IV SOLN: INTRAVENOUS | Status: DC

## 2022-08-01 MED ORDER — SUCCINYLCHOLINE CHLORIDE 200 MG/10ML IV SOSY
PREFILLED_SYRINGE | INTRAVENOUS | Status: DC | PRN
  Administered 2022-08-01: 120 mg via INTRAVENOUS

## 2022-08-01 MED ORDER — LACTATED RINGERS IV SOLN: INTRAVENOUS | Status: DC

## 2022-08-01 MED ORDER — PROPOFOL 10 MG/ML IV BOLUS
INTRAVENOUS | Status: DC | PRN
  Administered 2022-08-01: 150 mg via INTRAVENOUS
  Administered 2022-08-01: 20 mg via INTRAVENOUS
  Administered 2022-08-01: 30 mg via INTRAVENOUS

## 2022-08-01 MED ORDER — LIDOCAINE 2% (20 MG/ML) 5 ML SYRINGE
INTRAMUSCULAR | Status: DC | PRN
  Administered 2022-08-01: 50 mg via INTRAVENOUS

## 2022-08-01 MED ORDER — SODIUM CHLORIDE 0.9 % IV SOLN: INTRAVENOUS | Status: DC

## 2022-08-01 MED ORDER — DEXAMETHASONE SODIUM PHOSPHATE 10 MG/ML IJ SOLN
INTRAMUSCULAR | Status: DC | PRN
  Administered 2022-08-01: 5 mg via INTRAVENOUS

## 2022-08-01 MED ORDER — CIPROFLOXACIN IN D5W 400 MG/200ML IV SOLN
INTRAVENOUS | Status: DC | PRN
  Administered 2022-08-01: 400 mg via INTRAVENOUS

## 2022-08-01 MED ORDER — CIPROFLOXACIN IN D5W 400 MG/200ML IV SOLN
INTRAVENOUS | Status: AC
  Filled 2022-08-01: qty 200

## 2022-08-01 SURGICAL SUPPLY — 15 items

## 2022-08-01 NOTE — Anesthesia Postprocedure Evaluation (Signed)
Anesthesia Post Note  Patient: Isaac Meza  Procedure(s) Performed: ESOPHAGOGASTRODUODENOSCOPY (EGD) WITH PROPOFOL UPPER ENDOSCOPIC ULTRASOUND (EUS) LINEAR FINE NEEDLE ASPIRATION (FNA) LINEAR     Patient location during evaluation: Endoscopy Anesthesia Type: General Level of consciousness: awake and alert Pain management: pain level controlled Vital Signs Assessment: post-procedure vital signs reviewed and stable Respiratory status: spontaneous breathing, nonlabored ventilation and respiratory function stable Cardiovascular status: blood pressure returned to baseline and stable Postop Assessment: no apparent nausea or vomiting Anesthetic complications: no   No notable events documented.  Last Vitals:  Vitals:   08/01/22 1140 08/01/22 1145  BP: (!) 169/106   Pulse: 76 72  Resp: 13 (!) 22  Temp:    SpO2: 99% 90%    Last Pain:  Vitals:   08/01/22 1120  TempSrc:   PainSc: 0-No pain                 Marquasha Brutus

## 2022-08-01 NOTE — Anesthesia Preprocedure Evaluation (Signed)
Anesthesia Evaluation  Patient identified by MRN, date of birth, ID band Patient awake    Reviewed: Allergy & Precautions, NPO status , Patient's Chart, lab work & pertinent test results  History of Anesthesia Complications Negative for: history of anesthetic complications  Airway Mallampati: II  TM Distance: >3 FB Neck ROM: Full    Dental  (+) Poor Dentition,    Pulmonary asthma , Current Smoker and Patient abstained from smoking.   breath sounds clear to auscultation       Cardiovascular hypertension, Pt. on medications (-) angina (-) Past MI and (-) CHF  Rhythm:Regular     Neuro/Psych negative neurological ROS  negative psych ROS   GI/Hepatic negative GI ROS, Neg liver ROS,,,  Endo/Other  negative endocrine ROS    Renal/GU negative Renal ROS     Musculoskeletal negative musculoskeletal ROS (+)    Abdominal   Peds  Hematology negative hematology ROS (+) Lab Results      Component                Value               Date                      WBC                      11.2 (H)            08/01/2022                HGB                      14.2                08/01/2022                HCT                      42.9                08/01/2022                MCV                      96.0                08/01/2022                PLT                      297                 08/01/2022              Anesthesia Other Findings   Reproductive/Obstetrics                              Anesthesia Physical Anesthesia Plan  ASA: 3  Anesthesia Plan: General   Post-op Pain Management: Minimal or no pain anticipated   Induction: Intravenous, Rapid sequence and Cricoid pressure planned  PONV Risk Score and Plan: 1 and Ondansetron and Dexamethasone  Airway Management Planned: Oral ETT  Additional Equipment: None  Intra-op Plan:   Post-operative Plan: Extubation in OR  Informed Consent: I have  reviewed the patients History and Physical, chart, labs and discussed the  procedure including the risks, benefits and alternatives for the proposed anesthesia with the patient or authorized representative who has indicated his/her understanding and acceptance.     Dental advisory given  Plan Discussed with: CRNA  Anesthesia Plan Comments:          Anesthesia Quick Evaluation

## 2022-08-01 NOTE — Plan of Care (Signed)

## 2022-08-01 NOTE — Op Note (Addendum)
[facility] Patient Name: Isaac Meza Procedure Date : 08/01/2022 MRN: [id number] Attending MD: Jeani Hawking , MD, [id number] Date of Birth: [birth year] CSN: [id number] Age: 52 Admit Type: Inpatient Procedure:                Upper EUS Indications:              Pancreatic cyst on CT scan Providers:                Jeani Hawking, MD, Lorenza Evangelist, RN, Sunday Corn                            Mbumina, Technician Referring MD:             Merwyn Katos Medicines:                General Anesthesia Complications:            No immediate complications. Estimated Blood Loss:     Estimated blood loss: none. Procedure:                Pre-Anesthesia Assessment:                           - Prior to the procedure, a History and Physical                            was performed, and patient medications and                            allergies were reviewed. The patient's tolerance of                            previous anesthesia was also reviewed. The risks                            and benefits of the procedure and the sedation                            options and risks were discussed with the patient.                            All questions were answered, and informed consent                            was obtained. Prior Anticoagulants: The patient has                            taken no anticoagulant or antiplatelet agents. ASA                            Grade Assessment: III - A patient with severe                            systemic disease. After reviewing the risks and  benefits, the patient was deemed in satisfactory                            condition to undergo the procedure.                           - Sedation was administered by an anesthesia                            professional. General anesthesia was attained.                           After obtaining informed consent, the endoscope was                            passed under direct  vision. Throughout the                            procedure, the patient's blood pressure, pulse, and                            oxygen saturations were monitored continuously. The                            [id number] ([id number]) Olympus linear ultrasound scope                            was introduced through the mouth, and advanced to                            the second part of duodenum. The upper EUS was                            technically difficult and complex. The patient                            tolerated the procedure well. Scope In: Scope Out: Findings:      ENDOSCOPIC FINDING: :      Multiple dispersed medium erosions with stigmata of recent bleeding were       found in the gastric body.      ENDOSONOGRAPHIC FINDING: :      An anechoic and hypoechoic lesion suggestive of a cyst was identified in       the pancreatic head. It is not in obvious communication with the       pancreatic duct. The lesion measured 33 mm by 32 mm in maximal       cross-sectional diameter. There were 2 compartments thickly septated.       The outer wall of the lesion was thick. There was no associated mass.       There was internal debris within the fluid-filled cavity. Diagnostic       needle aspiration for fluid was performed. Color Doppler imaging was       utilized prior to needle puncture to confirm a lack of significant       vascular structures within the needle path. One pass  was made with the       19 gauge needle using a transduodenal approach. No stylet was used. The       amount of fluid collected was 20 mL. The fluid was opaque, brown and       thin. Sample(s) were sent for amylase concentration, glucose, cytology       and CEA.      One enlarged lymph node was visualized in the peripancreatic region. It       measured 10 mm by 11 mm in maximal cross-sectional diameter. The node       was round, hypoechoic and had well defined margins.      In the gastric lumen there was evidence of  NG tube trauma. This created       "kissing erosions" from the suctioning. The antrum and the pylorus were       normal. In the duodenal bulb there was evidence of edema and friability       from the cyst. This created a relative obstruction in the duodenal bulb.       With gentle rotation of the echoendoscope the area was successfully       navigated. The second portion of the duodenum and distally all appeared       to be normal. There was no evdience of any ulceration(s) and no duodenal       diverticula was found. With ultrasound the cyst was identified and there       was a fluid level. A thicker hypoechoic fluid was noted below an       anechoic fluid. The cyst measured 34 x 31 cm and its relationship with       the pancreas was uncertain. The origin of the cyst is not clear. There       was clearly no PD dilation and most of the pancreatic parenchyma       appeared normal. There was no clear evidence of any masses, but an 11 mm       peripancreatic lymph node was identified. A moderately sized vessel       coursed adjacent to the LN and an FNA was not possible. This region       surrounding the cyst appeared to be inflammed and many vessels were       coursing through the area. A narrow window was obtained to pass the 19       gauge FNA needle. Fluid aspiration was easily performed and 20 ml of       thin brown fluid was aspirated. The fluid appeared to have a blood       tinged quality. This was not bile as the cyst was further distal to the       gallbladder. After the aspiration the cyst was remeasured to be 1 cc       smaller at 22 x 22 cm. Doppler evaluation was performed to ensure that       there was no bleeding. Impression:               - Erosive gastropathy with stigmata of recent                            bleeding.                           - A cystic lesion was  seen in the pancreatic head.                            Fine needle aspiration for fluid performed.                            - One enlarged lymph node was visualized in the                            peripancreatic region. Recommendation:           - Return patient to hospital ward for ongoing care.                           - Maintain NPO.                           - Reinsert NG tube, if necessary. Procedure Code(s):        --- Professional ---                           [phone], Esophagogastroduodenoscopy, flexible,                            transoral; with transendoscopic ultrasound-guided                            intramural or transmural fine needle                            aspiration/biopsy(s), (includes endoscopic                            ultrasound examination limited to the esophagus,                            stomach or duodenum, and adjacent structures) Diagnosis Code(s):        --- Professional ---                           W29.5, Cyst of pancreas                           K92.2, Gastrointestinal hemorrhage, unspecified                           R59.0, Localized enlarged lymph nodes CPT copyright 2022 American Medical Association. All rights reserved. The codes documented in this report are preliminary and upon coder review may  be revised to meet current compliance requirements. Jeani Hawking, MD Jeani Hawking, MD 08/01/2022 11:21:27 AM This report has been signed electronically. Number of Addenda: 0

## 2022-08-01 NOTE — Progress Notes (Signed)
PROGRESS NOTE Isaac Meza  UJW:[id number] DOB: 1970/09/23 DOA: 07/29/2022 PCP: Isaac Mail, DO  Brief Narrative/Hospital Course: 52 y.o.m w/hypertension,recent appendectomy in 05/2022 (robotic assisted laparoscopic procedure), with no apparent complications who was seen in ED 06/06/2022 for mild pancreatitis and he was treated with supportive medical therapy, he did not require hospitalization.  Now he reports 1 week of abdominal pain, epigastric in location, dull to sharp in nature, that has been persistent and worsening. He has been not able to eat due to pain, and he did not have any bowel movements, only passing gas occasionally over the last seven days. About 3 days after the onset of the abdominal pain he started to taking oral analgesics (percocet, medication left over from his prior diagnosis of pancreatitis). His pain was persistent despite oral analgesics and about 4 days later he developed vomiting and was not able to drink liquid anymore.  Patient presented to the ED at [facility] was placed on IV fluids IV and NG tube  CT abdomen with chronic pancreatic pseudocyst, with fluid collection, adjacent to the pancreatic head and duodenum. Surgery and GI were consulted and recommendations was to transfer patient to [facility] for further work up with endoscopic [place] and accepted by Dr. Elnoria Howard  Subjective:  Patient seen and examined this morning wife is at the bedside Complains of ongoing abdominal pain but well-controlled, NG tube in place-output 4.7 L past 24 hours No bowel movement has not eaten  Assessment and Plan: Principal Problem:   Chronic pancreatitis (HCC) Active Problems:   Hypertension   Dehydration   Duodenal obstruction   Abnormal CT of the abdomen   Pancreatic cyst   Nausea and vomiting   Pancreatic pseudocyst   Protein-calorie malnutrition, severe   Abdominal pain nausea and vomiting Pancreatitis with pancreatic pseudocyst chronic Duodenal obstruction secondary to  pseudocyst Ileus: Patient presentation with nausea vomiting abdominal pain due to pancreatic pseudocyst with pancreatitis, pseudocyst appears to be communicating with the duodenal wall causing duodenal obstruction. GI is following closely and continue NG tube decompression -NG output 4.7 L past 24 hours, given aggressive IV fluid hydration, pain management with oral/IV opiates.  LFTs normal lipase 125 on admission with mild leukocytosis but no fever> leukocytosis resolved 4/25 GI planning for EGD with EUS 4/26.    Hypokalemia: resolved- cont ivf w/ kcl Recent Labs  Lab 07/27/22 0751 07/30/22 0426 07/30/22 1756 07/31/22 0558 08/01/22 0210  K 3.8 2.9* 4.6 4.5 4.9  CALCIUM 9.8 8.7*  --  9.1 9.0  MG  --  2.1  --  2.1  --     Dehydration  Hypochloremic hyponatremia: Due to #1,.UA with ketones 80. Cont on IV fluid hydration and supportive care   Hypertension: BP fairly controlled, holding home HCTZ and lisinopril since NPO  Alcohol abuse: Monitor for any signs of withdrawal reports he has recently quit  Severe malnutrition augment diet once able to take orally  Nutrition Problem: Severe Malnutrition Etiology: chronic illness (pancreatitis, recent appendectomy) Signs/Symptoms: severe fat depletion, moderate fat depletion, severe muscle depletion, percent weight loss Percent weight loss: 22.4 % Interventions: Refer to RD note for recommendations    DVT prophylaxis: SCDs Start: 07/29/22 1745 Code Status:   Code Status: Full Code Family Communication: plan of care discussed with patient/wife at bedside. Patient status is: Inpatient because of pancreatic pseudocyst Level of care: Med-Surg   Dispo: The patient is from: home            Anticipated disposition: TBD Objective: Vitals last 24  hrs: Vitals:   07/30/22 1830 07/31/22 0431 08/01/22 0445 08/01/22 0849  BP: 110/69 133/80 (!) 153/96 (!) 150/108  Pulse:  79 68 86  Resp:  15  18  Temp:  98.2 F (36.8 C) 99.1 F (37.3 C)  (!) 97.4 F (36.3 C)  TempSrc:  Oral Oral Oral  SpO2:  99% 96% 100%  Weight:      Height:       Weight change:   Physical Examination: General exam: alert awake, oriented, older than stated age HEENT:Oral mucosa moist, Ear/Nose WNL grossly Respiratory system: Bilaterally clear BS, no use of accessory muscle Cardiovascular system: S1 & S2 +, No JVD. Gastrointestinal system: Abdomen soft, tender mid abdomen, scaphoid, bowel sound present  Nervous System: Alert, awake, moving extremities, follows commands. Extremities: LE edema ,distal peripheral pulses palpable.  Skin: No rashes,no icterus. MSK: Normal muscle bulk,tone, power   Medications reviewed:  Scheduled Meds:  pantoprazole (PROTONIX) IV  40 mg Intravenous Q24H   sodium chloride flush  10-40 mL Intracatheter Q12H  Continuous Infusions:  dextrose 5% lactated ringers 1000 ml with potassium chloride 30 mEq/L 100 mL/hr at 07/31/22 2318    Diet Order             Diet NPO time specified  Diet effective now                  Intake/Output Summary (Last 24 hours) at 08/01/2022 0849 Last data filed at 08/01/2022 0450 Gross per 24 hour  Intake --  Output 5200 ml  Net -5200 ml    Net IO Since Admission: -10,213.16 mL [08/01/22 0849]  Wt Readings from Last 3 Encounters:  07/29/22 50 kg  07/27/22 54.4 kg  07/02/22 60.4 kg     Unresulted Labs (From admission, onward)     Start     Ordered   07/31/22 0500  CBC  Daily,   R     Question:  Specimen collection method  Answer:  Lab=Lab collect   07/30/22 1127   07/31/22 0500  Comprehensive metabolic panel  Daily,   R     Question:  Specimen collection method  Answer:  Lab=Lab collect   07/30/22 1127          Data Reviewed: I have personally reviewed following labs and imaging studies CBC: Recent Labs  Lab 07/27/22 0751 07/30/22 0426 07/31/22 0558 08/01/22 0210  WBC 9.4 11.2* 10.5 11.2*  HGB 17.5* 16.5 15.0 14.2  HCT 50.2 45.9 44.8 42.9  MCV 91.4 90.0 94.3  96.0  PLT 396 304 288 297    Basic Metabolic Panel: Recent Labs  Lab 07/27/22 0751 07/30/22 0426 07/30/22 1756 07/31/22 0558 08/01/22 0210  NA 137 134*  --  139 140  K 3.8 2.9* 4.6 4.5 4.9  CL 96* 95*  --  98 101  CO2 26 28  --  32 30  GLUCOSE 131* 127*  --  117* 114*  BUN 11 12  --  15 16  CREATININE 0.87 0.71  --  0.80 0.91  CALCIUM 9.8 8.7*  --  9.1 9.0  MG  --  2.1  --  2.1  --     GFR: Estimated Creatinine Clearance: 69.4 mL/min (by C-G formula based on SCr of 0.91 mg/dL). Liver Function Tests: Recent Labs  Lab 07/27/22 0751 07/31/22 0558 08/01/22 0210  AST 14* 11* 21  ALT 9 10 20   ALKPHOS 89 59 63  BILITOT 1.1 0.9 1.0  PROT 7.8 5.9* 6.0*  ALBUMIN 4.5 3.1* 3.0*    Recent Labs  Lab 07/27/22 0751  LIPASE 124*     No results found for this or any previous visit (from the past 240 hour(s)).  Antimicrobials: Anti-infectives (From admission, onward)    None      Culture/Microbiology No results found for: "SDES", "SPECREQUEST", "CULT", "REPTSTATUS"   Radiology Studies: No results found.   LOS: 3 days   Lanae Boast, MD [facility]  08/01/2022, 8:49 AM

## 2022-08-01 NOTE — Transfer of Care (Signed)
Immediate Anesthesia Transfer of Care Note  Patient: Isaac Meza  Procedure(s) Performed: ESOPHAGOGASTRODUODENOSCOPY (EGD) WITH PROPOFOL UPPER ENDOSCOPIC ULTRASOUND (EUS) LINEAR FINE NEEDLE ASPIRATION (FNA) LINEAR  Patient Location: Endoscopy Unit  Anesthesia Type:General  Level of Consciousness: awake, alert , oriented, and patient cooperative  Airway & Oxygen Therapy: Patient Spontanous Breathing and Patient connected to nasal cannula oxygen  Post-op Assessment: Report given to RN and Post -op Vital signs reviewed and stable  Post vital signs: Reviewed and stable, see post op vital signs flow sheet in endo  Last Vitals:  Vitals Value Taken Time  BP    Temp    Pulse 82 08/01/22 1105  Resp 13 08/01/22 1105  SpO2 97 % 08/01/22 1105  Vitals shown include unvalidated device data.  Last Pain:  Vitals:   08/01/22 0903  TempSrc: Tympanic  PainSc: 0-No pain      Patients Stated Pain Goal: 0 (08/01/22 9147)  Complications: No notable events documented.

## 2022-08-01 NOTE — Anesthesia Procedure Notes (Signed)
Procedure Name: Intubation Date/Time: 08/01/2022 10:04 AM  Performed by: Waynard Edwards, CRNAPre-anesthesia Checklist: Patient identified, Emergency Drugs available, Suction available and Patient being monitored Patient Re-evaluated:Patient Re-evaluated prior to induction Oxygen Delivery Method: Circle system utilized Preoxygenation: Pre-oxygenation with 100% oxygen Induction Type: IV induction, Rapid sequence and Cricoid Pressure applied Laryngoscope Size: Miller and 2 Grade View: Grade I Tube type: Oral Tube size: 7.0 mm Number of attempts: 1 Airway Equipment and Method: Stylet Placement Confirmation: ETT inserted through vocal cords under direct vision, positive ETCO2 and breath sounds checked- equal and bilateral Secured at: 22 cm Tube secured with: Tape Dental Injury: Teeth and Oropharynx as per pre-operative assessment

## 2022-08-02 DIAGNOSIS — R112 Nausea with vomiting, unspecified: Secondary | ICD-10-CM | POA: Diagnosis not present

## 2022-08-02 DIAGNOSIS — R935 Abnormal findings on diagnostic imaging of other abdominal regions, including retroperitoneum: Secondary | ICD-10-CM

## 2022-08-02 DIAGNOSIS — D649 Anemia, unspecified: Secondary | ICD-10-CM | POA: Diagnosis not present

## 2022-08-02 DIAGNOSIS — E860 Dehydration: Secondary | ICD-10-CM | POA: Diagnosis not present

## 2022-08-02 DIAGNOSIS — K315 Obstruction of duodenum: Secondary | ICD-10-CM | POA: Diagnosis not present

## 2022-08-02 DIAGNOSIS — K259 Gastric ulcer, unspecified as acute or chronic, without hemorrhage or perforation: Secondary | ICD-10-CM

## 2022-08-02 DIAGNOSIS — K863 Pseudocyst of pancreas: Secondary | ICD-10-CM | POA: Diagnosis not present

## 2022-08-02 DIAGNOSIS — K860 Alcohol-induced chronic pancreatitis: Secondary | ICD-10-CM | POA: Diagnosis not present

## 2022-08-02 DIAGNOSIS — I10 Essential (primary) hypertension: Secondary | ICD-10-CM | POA: Diagnosis not present

## 2022-08-02 DIAGNOSIS — E43 Unspecified severe protein-calorie malnutrition: Secondary | ICD-10-CM

## 2022-08-02 DIAGNOSIS — K862 Cyst of pancreas: Secondary | ICD-10-CM | POA: Diagnosis not present

## 2022-08-02 DIAGNOSIS — K861 Other chronic pancreatitis: Secondary | ICD-10-CM | POA: Diagnosis not present

## 2022-08-02 LAB — COMPREHENSIVE METABOLIC PANEL
ALT: 35 U/L (ref 0–44)
AST: 28 U/L (ref 15–41)
Albumin: 2.7 g/dL — ABNORMAL LOW (ref 3.5–5.0)
Alkaline Phosphatase: 59 U/L (ref 38–126)
Anion gap: 8 (ref 5–15)
BUN: 12 mg/dL (ref 6–20)
CO2: 22 mmol/L (ref 22–32)
Calcium: 8.2 mg/dL — ABNORMAL LOW (ref 8.9–10.3)
Chloride: 103 mmol/L (ref 98–111)
Creatinine, Ser: 0.7 mg/dL (ref 0.61–1.24)
GFR, Estimated: >60 mL/min (ref >60)
Glucose, Bld: 110 mg/dL — ABNORMAL HIGH (ref 70–99)
Potassium: 3.8 mmol/L (ref 3.5–5.1)
Sodium: 133 mmol/L — ABNORMAL LOW (ref 135–145)
Total Bilirubin: 0.7 mg/dL (ref 0.3–1.2)
Total Protein: 5.3 g/dL — ABNORMAL LOW (ref 6.5–8.1)

## 2022-08-02 LAB — CBC
HCT: 37.7 % — ABNORMAL LOW (ref 39.0–52.0)
Hemoglobin: 12.8 g/dL — ABNORMAL LOW (ref 13.0–17.0)
MCH: 32 pg (ref 26.0–34.0)
MCHC: 34 g/dL (ref 30.0–36.0)
MCV: 94.3 fL (ref 80.0–100.0)
Platelets: 268 10*3/uL (ref 150–400)
RBC: 4 MIL/uL — ABNORMAL LOW (ref 4.22–5.81)
RDW: 11.8 % (ref 11.5–15.5)
WBC: 11.5 10*3/uL — ABNORMAL HIGH (ref 4.0–10.5)
nRBC: 0 % (ref 0.0–0.2)

## 2022-08-02 NOTE — Progress Notes (Signed)
Daily Progress Note  DOA: 07/29/2022 Hospital Day: 5 Chief Complaint: pancreatic pseudocyst with duodenal obstruction   Assessment and Plan:    Brief Narrative:  Isaac Meza is a 52 y.o. year old male with a past medical history not limited to HTN, pancreatitis. Presented to hospital with N/V and abdominal pain CT scan showed a pancreatic pseudocyst that appeared to communicate with the duodenal wall causing duodenal obstruction,  Recent pancreatitis. Now admitted with abdominal pain, loss of appetite and 3.9 x 4.0 x 5.7 cm pancreatic pseudocyst resulting in duodenal obstruction.  Etiology of recent pancreatitis? Maybe Etoh  Drinks 3 shots of liquor daily. Other possibilities could be medication. He had started HCTZ about a month prior . Also, ace inhibitors can cause pancreatitis and he takes Lisinopril. Awaiting cytology results   Gastric erosion, possibly NGT trauma Continue pantoprazole   Subjective / New Events:  Able to eat today. Still has some abdominal discomfort but pain meds help  WBC 11.5,, Hgb 12.8  Objective:   EGD / EUS with FNA 4/26 Endoscopic findings:   -Erosive gastropathy with stigmata of recent bleeding. ENDOSONOGRAPHIC FINDING  A cystic lesion was seen in the pancreatic head. Fine needle aspiration for fluid performed. One enlarged lymph node was visualized in the peripancreatic region.       Recent Labs    07/31/22 0558 08/01/22 0210 08/02/22 0403  WBC 10.5 11.2* 11.5*  HGB 15.0 14.2 12.8*  HCT 44.8 42.9 37.7*  PLT 288 297 268   BMET Recent Labs    07/31/22 0558 08/01/22 0210 08/02/22 0403  NA 139 140 133*  K 4.5 4.9 3.8  CL 98 101 103  CO2 32 30 22  GLUCOSE 117* 114* 110*  BUN 15 16 12   CREATININE 0.80 0.91 0.70  CALCIUM 9.1 9.0 8.2*   LFT Recent Labs    08/02/22 0403  PROT 5.3*  ALBUMIN 2.7*  AST 28  ALT 35  ALKPHOS 59  BILITOT 0.7   PT/INR No results for input(s): "LABPROT", "INR" in the last 72  hours.   Imaging:  DG Abd 1 View CLINICAL DATA:  Enteric tube placement.  EXAM: ABDOMEN - 1 VIEW  COMPARISON:  Same day CT abdomen pelvis  FINDINGS: An enteric tube terminates in the stomach with the side port near the gastroesophageal junction.  IMPRESSION: An enteric tube terminates in the stomach with the side port near the gastroesophageal junction.  Electronically Signed   By: Romona Curls M.D.   On: 07/27/2022 12:32 CT ABDOMEN PELVIS W CONTRAST CLINICAL DATA:  52 year old male with history of generalized abdominal pain for the past 7 days. Severe pancreatitis.  EXAM: CT ABDOMEN AND PELVIS WITH CONTRAST  TECHNIQUE: Multidetector CT imaging of the abdomen and pelvis was performed using the standard protocol following bolus administration of intravenous contrast.  RADIATION DOSE REDUCTION: This exam was performed according to the departmental dose-optimization program which includes automated exposure control, adjustment of the mA and/or kV according to patient size and/or use of iterative reconstruction technique.  CONTRAST:  OMNIPAQUE IOHEXOL 300 MG/ML  SOLN  COMPARISON:  CT the abdomen and pelvis 06/06/2022.  FINDINGS: Lower chest: 4 mm right middle lobe pulmonary nodule (axial image 22 of series 4), unchanged. Linear scarring in the base of the left lower lobe. Mild elevation of the left hemidiaphragm. Severe circumferential thickening of the distal esophagus.  Hepatobiliary: No suspicious cystic or solid hepatic lesions. No intra or extrahepatic biliary ductal dilatation. Gallbladder is unremarkable  in appearance.  Pancreas: The body and tail of the pancreas are grossly normal in appearance. No pancreatic ductal dilatation. Adjacent to the pancreatic head there is a rim enhancing fluid collection (axial image 43 of series 2 and sagittal image 79 of series 6) measuring approximately 3.9 x 4.0 x 5.7 cm which is similar to prior studies dating  back to 05/16/2022. Pancreatic head and uncinate process are otherwise grossly normal in appearance. Pancreatic parenchyma enhances normally. No overt surrounding peripancreatic inflammatory changes are noted on today's examination.  Spleen: Unremarkable.  Adrenals/Urinary Tract: Bilateral kidneys and bilateral adrenal glands are normal in appearance. No hydroureteronephrosis. Urinary bladder is normal in appearance.  Stomach/Bowel: Stomach is distended and fluid-filled. Duodenal wall appears thickened and edematous, best appreciated on axial image 33 of series 2. Previously described peripancreatic rim enhancing fluid collection demonstrates communication with the medial wall of the proximal duodenal best appreciated on axial image 36 of series 2, coronal image 38 of series 5 and sagittal image 75 of series 6. No pathologic dilatation of small bowel or colon. A few scattered colonic diverticula are noted, without surrounding inflammatory changes to indicate an acute diverticulitis at this time. Status post appendectomy.  Vascular/Lymphatic: Atherosclerosis in the abdominal aorta and pelvic vasculature, without evidence of aneurysm or dissection. No lymphadenopathy noted in the abdomen or pelvis.  Reproductive: Prostate gland and seminal vesicles are unremarkable in appearance.  Other: No significant volume of ascites.  No pneumoperitoneum.  Musculoskeletal: There are no aggressive appearing lytic or blastic lesions noted in the visualized portions of the skeleton.  IMPRESSION: 1. No definite peripancreatic inflammatory changes to indicate complicated pancreatitis. 2. Persistent duodenal wall thickening and edema with abnormal rim enhancing fluid collection adjacent to the pancreatic head and proximal duodenum, as detailed above, similar to prior studies. Whether this represents a chronic pancreatic pseudocyst, atypical duodenal diverticulum, or chronic contained perforated  duodenal ulcer is uncertain. Further evaluation with endoscopy and endoscopic ultrasound should be considered in the appropriate clinical setting. 3. Severe circumferential thickening of the distal esophagus, which may suggest esophagitis. This too could be evaluated and at endoscopy if clinically appropriate. 4. Mild colonic diverticulosis without evidence of acute diverticulitis at this time. 5. 4 mm right middle lobe pulmonary nodule, nonspecific, but stable compared to prior studies and statistically likely benign. No follow-up needed if patient is low-risk.This recommendation follows the consensus statement: Guidelines for Management of Incidental Pulmonary Nodules Detected on CT Images: From the Fleischner Society 2017; Radiology 2017; 284:228-243. 6. Aortic atherosclerosis.  Electronically Signed   By: Trudie Reed M.D.   On: 07/27/2022 08:50     Scheduled inpatient medications:   pantoprazole (PROTONIX) IV  40 mg Intravenous Q24H   sodium chloride flush  10-40 mL Intracatheter Q12H   Continuous inpatient infusions:   dextrose 5 % and 0.9% NaCl 100 mL/hr at 08/02/22 1055   PRN inpatient medications: acetaminophen **OR** acetaminophen, hydrALAZINE, HYDROmorphone (DILAUDID) injection, ondansetron **OR** ondansetron (ZOFRAN) IV, sodium chloride flush  Vital signs in last 24 hours: Temp:  [98.5 F (36.9 C)-99.2 F (37.3 C)] 98.8 F (37.1 C) (04/27 0802) Pulse Rate:  [65-71] 65 (04/27 0802) Resp:  [16-18] 16 (04/27 0802) BP: (123-166)/(79-104) 164/87 (04/27 0802) SpO2:  [99 %-100 %] 100 % (04/27 0802) Last BM Date : 08/01/22  Intake/Output Summary (Last 24 hours) at 08/02/2022 1213 Last data filed at 08/02/2022 0439 Gross per 24 hour  Intake 1284.36 ml  Output 3 ml  Net 1281.36 ml    Intake/Output  from previous day: 04/26 0701 - 04/27 0700 In: 1894.4 [I.V.:1694.4; IV Piggyback:200] Out: 153 [Urine:3; Emesis/NG output:150] Intake/Output this shift: No  intake/output data recorded.   Physical Exam:  General: Alert male in NAD Heart:  Regular rate and rhythm.  Pulmonary: Normal respiratory effort Abdomen: Soft, nondistended, nontender. Normal bowel sounds. Extremities: No lower extremity edema  Neurologic: Alert and oriented Psych: Pleasant. Cooperative. Insight appears normal.    Principal Problem:   Chronic pancreatitis (HCC) Active Problems:   Hypertension   Duodenal obstruction   Dehydration   Abnormal CT of the abdomen   Pancreatic cyst   Nausea and vomiting   Pancreatic pseudocyst   Protein-calorie malnutrition, severe     LOS: 4 days   Willette Cluster ,NP 08/02/2022, 12:13 PM

## 2022-08-02 NOTE — Progress Notes (Signed)
PROGRESS NOTE    Isaac Meza  YQM:[id number] DOB: [birth year] DOA: 07/29/2022 PCP: Margarita Mail, DO    Brief Narrative:   52 y.o. male with past medical history of/hypertension,recent appendectomy in 05/2022 (robotic assisted laparoscopic procedure), w presented to the ED on 06/06/2022 for mild pancreatitis and he was treated with supportive medical therapy, he did not require hospitalization.  Subsequently he had 1 week of 1 week of abdominal pain, epigastric in location, dull to sharp in nature, that has been persistent and worsening.  He was unable to eat and was constipated.  He took some oral analgesics but his pain persisted with vomiting and decreased tolerance to oral feeds so he presented to ED [facility].  Patient was put on IV fluids IV and NG tube initially and CT scan of the abdomen was performed which showed chronic pancreatic pseudocyst, with fluid collection, adjacent to the pancreatic head and duodenum. Surgery and GI were consulted and recommendations was to transfer patient to [facility] for further work up with endoscopic [place]   Assessment and Plan: * Chronic pancreatitis (HCC) Pancreatic pseudocyst Ileus   Patient is currently n.p.o. IV fluids antiemetics and analgesics with PPI and supportive care.  Patient had NG tube to intermittent suction initially and underwent endoscopic ultrasound on 08/01/2022 with findings of large cystic lesion in the pancreatic head with some septation and internal debris's.  Needle aspiration was performed.  Erosive gastropathy was noted.  Continue Protonix IV.  Follow GI recommendation.  Patient is currently n.p.o. patient feels hungry.  Dietary recommendations as per GI.  Hypertension On IV hydralazine.  Patient is currently NPO.  Dehydration/volume depletion Currently on D5 normal saline at 75 mill per hour.  Patient is n.p.o. status.  Will increase to 100 ml/h.   DVT prophylaxis: SCDs Start: 07/29/22 1745   Code Status:     Code Status: Full  Code  Disposition: Home likely in 2 to 3 days  Status is: Inpatient Remains inpatient appropriate because: N.p.o. status, large cystic lesion, GI following,   Family Communication: None at bedside  Consultants:  GI  Procedures:  Endoscopic ultrasound 08/01/2022 NG tube insertion and removal  Antimicrobials:  None  Anti-infectives (From admission, onward)    None        Subjective: Today, patient was seen and examined at bedside.  Patient still complains of abdominal pain.  Feels hungry.  Denies any nausea or vomiting.  Has not had a bowel movement in 1 week since he has not been eating as per the patient.  Objective: Vitals:   08/01/22 1200 08/01/22 1543 08/01/22 1958 08/02/22 0802  BP: (!) 173/101 123/79 (!) 166/104 (!) 164/87  Pulse: 72 70 71 65  Resp: 16 17 18 16   Temp: 98 F (36.7 C) 99.2 F (37.3 C) 98.5 F (36.9 C) 98.8 F (37.1 C)  TempSrc: Oral Oral Oral Oral  SpO2: 98% 99% 100% 100%  Weight:      Height:        Intake/Output Summary (Last 24 hours) at 08/02/2022 0958 Last data filed at 08/02/2022 0439 Gross per 24 hour  Intake 1894.36 ml  Output 3 ml  Net 1891.36 ml   Filed Weights   07/29/22 1655 07/29/22 1700  Weight: 50 kg 50 kg    Physical Examination: Body mass index is 17.26 kg/m.   General: Thinly built, not in obvious distress, alert awake and Communicative HENT:   No scleral pallor or icterus noted. Oral mucosa is moist.  Chest:  Clear breath  sounds.  Diminished breath sounds bilaterally. No crackles or wheezes.  CVS: S1 &S2 heard. No murmur.  Regular rate and rhythm. Abdomen: Soft, tenderness over the epigastric region nondistended.  Bowel sounds are heard.   Extremities: No cyanosis, clubbing mild lower extremity edema.  Peripheral pulses are palpable. Psych: Alert, awake and oriented, normal mood CNS:  No cranial nerve deficits.  Power equal in all extremities.   Skin: Warm and dry.  No rashes noted.  Data Reviewed:    CBC: Recent Labs  Lab 07/27/22 0751 07/30/22 0426 07/31/22 0558 08/01/22 0210 08/02/22 0403  WBC 9.4 11.2* 10.5 11.2* 11.5*  HGB 17.5* 16.5 15.0 14.2 12.8*  HCT 50.2 45.9 44.8 42.9 37.7*  MCV 91.4 90.0 94.3 96.0 94.3  PLT 396 304 288 297 268    Basic Metabolic Panel: Recent Labs  Lab 07/27/22 0751 07/30/22 0426 07/30/22 1756 07/31/22 0558 08/01/22 0210 08/02/22 0403  NA 137 134*  --  139 140 133*  K 3.8 2.9* 4.6 4.5 4.9 3.8  CL 96* 95*  --  98 101 103  CO2 26 28  --  32 30 22  GLUCOSE 131* 127*  --  117* 114* 110*  BUN 11 12  --  15 16 12   CREATININE 0.87 0.71  --  0.80 0.91 0.70  CALCIUM 9.8 8.7*  --  9.1 9.0 8.2*  MG  --  2.1  --  2.1  --   --     Liver Function Tests: Recent Labs  Lab 07/27/22 0751 07/31/22 0558 08/01/22 0210 08/02/22 0403  AST 14* 11* 21 28  ALT 9 10 20  35  ALKPHOS 89 59 63 59  BILITOT 1.1 0.9 1.0 0.7  PROT 7.8 5.9* 6.0* 5.3*  ALBUMIN 4.5 3.1* 3.0* 2.7*     Radiology Studies: No results found.    LOS: 4 days   Joycelyn Das, MD [facility] Available via Epic secure chat 7am-7pm After these hours, please refer to coverage provider listed on [email or web address] 08/02/2022, 9:58 AM

## 2022-08-03 DIAGNOSIS — K862 Cyst of pancreas: Secondary | ICD-10-CM | POA: Diagnosis not present

## 2022-08-03 DIAGNOSIS — E860 Dehydration: Secondary | ICD-10-CM | POA: Diagnosis not present

## 2022-08-03 DIAGNOSIS — K863 Pseudocyst of pancreas: Secondary | ICD-10-CM | POA: Diagnosis not present

## 2022-08-03 DIAGNOSIS — R112 Nausea with vomiting, unspecified: Secondary | ICD-10-CM | POA: Diagnosis not present

## 2022-08-03 DIAGNOSIS — D649 Anemia, unspecified: Secondary | ICD-10-CM

## 2022-08-03 DIAGNOSIS — K861 Other chronic pancreatitis: Secondary | ICD-10-CM | POA: Diagnosis not present

## 2022-08-03 DIAGNOSIS — I10 Essential (primary) hypertension: Secondary | ICD-10-CM | POA: Diagnosis not present

## 2022-08-03 DIAGNOSIS — R935 Abnormal findings on diagnostic imaging of other abdominal regions, including retroperitoneum: Secondary | ICD-10-CM | POA: Diagnosis not present

## 2022-08-03 DIAGNOSIS — K315 Obstruction of duodenum: Secondary | ICD-10-CM | POA: Diagnosis not present

## 2022-08-03 DIAGNOSIS — K860 Alcohol-induced chronic pancreatitis: Secondary | ICD-10-CM | POA: Diagnosis not present

## 2022-08-03 DIAGNOSIS — K259 Gastric ulcer, unspecified as acute or chronic, without hemorrhage or perforation: Secondary | ICD-10-CM | POA: Diagnosis not present

## 2022-08-03 DIAGNOSIS — E43 Unspecified severe protein-calorie malnutrition: Secondary | ICD-10-CM | POA: Diagnosis not present

## 2022-08-03 LAB — COMPREHENSIVE METABOLIC PANEL
ALT: 24 U/L (ref 0–44)
AST: 15 U/L (ref 15–41)
Albumin: 2.5 g/dL — ABNORMAL LOW (ref 3.5–5.0)
Alkaline Phosphatase: 55 U/L (ref 38–126)
Anion gap: 6 (ref 5–15)
BUN: <5 mg/dL — ABNORMAL LOW (ref 6–20)
CO2: 24 mmol/L (ref 22–32)
Calcium: 8.1 mg/dL — ABNORMAL LOW (ref 8.9–10.3)
Chloride: 106 mmol/L (ref 98–111)
Creatinine, Ser: 0.66 mg/dL (ref 0.61–1.24)
GFR, Estimated: >60 mL/min (ref >60)
Glucose, Bld: 106 mg/dL — ABNORMAL HIGH (ref 70–99)
Potassium: 3.8 mmol/L (ref 3.5–5.1)
Sodium: 136 mmol/L (ref 135–145)
Total Bilirubin: 0.4 mg/dL (ref 0.3–1.2)
Total Protein: 4.9 g/dL — ABNORMAL LOW (ref 6.5–8.1)

## 2022-08-03 LAB — CBC
HCT: 36.1 % — ABNORMAL LOW (ref 39.0–52.0)
Hemoglobin: 12.4 g/dL — ABNORMAL LOW (ref 13.0–17.0)
MCH: 31.7 pg (ref 26.0–34.0)
MCHC: 34.3 g/dL (ref 30.0–36.0)
MCV: 92.3 fL (ref 80.0–100.0)
Platelets: 275 10*3/uL (ref 150–400)
RBC: 3.91 MIL/uL — ABNORMAL LOW (ref 4.22–5.81)
RDW: 11.7 % (ref 11.5–15.5)
WBC: 6.3 10*3/uL (ref 4.0–10.5)
nRBC: 0 % (ref 0.0–0.2)

## 2022-08-03 LAB — MAGNESIUM: Magnesium: 1.8 mg/dL (ref 1.7–2.4)

## 2022-08-03 NOTE — Progress Notes (Signed)
Daily Progress Note  DOA: 07/29/2022 Hospital Day: 6 Chief Complaint: pancreatic pseudocyst with duodenal obstruction   Assessment and Plan:    Brief Narrative:  Isaac Meza is a 52 y.o. year old male with a past medical history not limited to HTN, pancreatitis. Presented to hospital with N/V and abdominal pain CT scan showed a pancreatic pseudocyst that appeared to communicate with the duodenal wall causing duodenal obstruction,  Pancreatic pseudocyst. Recent episode of pancreatitis. Now admitted with abdominal pain, loss of appetite and 3.9 x 4.0 x 5.7 cm pancreatic pseudocyst resulting in duodenal obstruction s/p EUS with FNA with Dr. Elnoria Howard 4/26.  The cyst does not appear to communicate with the pancreatic duct and the pancreatic duct appears normal.  Etiology of recent pancreatitis? Maybe Etoh  Drinks 3 shots of liquor daily. Other possibilities could be medication. He had started HCTZ about a month prior . Also, ace inhibitors can cause pancreatitis and he takes Lisinopril.  -Ca+ 8.1 / albumin 2.5 -Plan: Awaiting cytology results. Will advance diet   Gastric erosion, possibly NGT trauma Continue pantoprazole  [place] anemia. Probably dilutional Hgb previously normal, now in 12 range   Subjective / New Events:  Still has mid abdominal discomfort wrapping around in belt like fashion. Tolerating clears, asking for solids   Objective:   EGD / EUS with FNA 4/26 Endoscopic findings:   -Erosive gastropathy with stigmata of recent bleeding. ENDOSONOGRAPHIC FINDING  A cystic lesion was seen in the pancreatic head. Fine needle aspiration for fluid performed. One enlarged lymph node was visualized in the peripancreatic region.       Recent Labs    08/01/22 0210 08/02/22 0403 08/03/22 0315  WBC 11.2* 11.5* 6.3  HGB 14.2 12.8* 12.4*  HCT 42.9 37.7* 36.1*  PLT 297 268 275   BMET Recent Labs    08/01/22 0210 08/02/22 0403 08/03/22 0315  NA 140 133* 136  K 4.9 3.8 3.8   CL 101 103 106  CO2 30 22 24   GLUCOSE 114* 110* 106*  BUN 16 12 <5*  CREATININE 0.91 0.70 0.66  CALCIUM 9.0 8.2* 8.1*   LFT Recent Labs    08/03/22 0315  PROT 4.9*  ALBUMIN 2.5*  AST 15  ALT 24  ALKPHOS 55  BILITOT 0.4   PT/INR No results for input(s): "LABPROT", "INR" in the last 72 hours.   Imaging:  DG Abd 1 View CLINICAL DATA:  Enteric tube placement.  EXAM: ABDOMEN - 1 VIEW  COMPARISON:  Same day CT abdomen pelvis  FINDINGS: An enteric tube terminates in the stomach with the side port near the gastroesophageal junction.  IMPRESSION: An enteric tube terminates in the stomach with the side port near the gastroesophageal junction.  Electronically Signed   By: Romona Curls M.D.   On: 07/27/2022 12:32 CT ABDOMEN PELVIS W CONTRAST CLINICAL DATA:  52 year old male with history of generalized abdominal pain for the past 7 days. Severe pancreatitis.  EXAM: CT ABDOMEN AND PELVIS WITH CONTRAST  TECHNIQUE: Multidetector CT imaging of the abdomen and pelvis was performed using the standard protocol following bolus administration of intravenous contrast.  RADIATION DOSE REDUCTION: This exam was performed according to the departmental dose-optimization program which includes automated exposure control, adjustment of the mA and/or kV according to patient size and/or use of iterative reconstruction technique.  CONTRAST:  OMNIPAQUE IOHEXOL 300 MG/ML  SOLN  COMPARISON:  CT the abdomen and pelvis 06/06/2022.  FINDINGS: Lower chest: 4 mm right middle lobe  pulmonary nodule (axial image 22 of series 4), unchanged. Linear scarring in the base of the left lower lobe. Mild elevation of the left hemidiaphragm. Severe circumferential thickening of the distal esophagus.  Hepatobiliary: No suspicious cystic or solid hepatic lesions. No intra or extrahepatic biliary ductal dilatation. Gallbladder is unremarkable in appearance.  Pancreas: The body and tail of  the pancreas are grossly normal in appearance. No pancreatic ductal dilatation. Adjacent to the pancreatic head there is a rim enhancing fluid collection (axial image 43 of series 2 and sagittal image 79 of series 6) measuring approximately 3.9 x 4.0 x 5.7 cm which is similar to prior studies dating back to 05/16/2022. Pancreatic head and uncinate process are otherwise grossly normal in appearance. Pancreatic parenchyma enhances normally. No overt surrounding peripancreatic inflammatory changes are noted on today's examination.  Spleen: Unremarkable.  Adrenals/Urinary Tract: Bilateral kidneys and bilateral adrenal glands are normal in appearance. No hydroureteronephrosis. Urinary bladder is normal in appearance.  Stomach/Bowel: Stomach is distended and fluid-filled. Duodenal wall appears thickened and edematous, best appreciated on axial image 33 of series 2. Previously described peripancreatic rim enhancing fluid collection demonstrates communication with the medial wall of the proximal duodenal best appreciated on axial image 36 of series 2, coronal image 38 of series 5 and sagittal image 75 of series 6. No pathologic dilatation of small bowel or colon. A few scattered colonic diverticula are noted, without surrounding inflammatory changes to indicate an acute diverticulitis at this time. Status post appendectomy.  Vascular/Lymphatic: Atherosclerosis in the abdominal aorta and pelvic vasculature, without evidence of aneurysm or dissection. No lymphadenopathy noted in the abdomen or pelvis.  Reproductive: Prostate gland and seminal vesicles are unremarkable in appearance.  Other: No significant volume of ascites.  No pneumoperitoneum.  Musculoskeletal: There are no aggressive appearing lytic or blastic lesions noted in the visualized portions of the skeleton.  IMPRESSION: 1. No definite peripancreatic inflammatory changes to indicate complicated pancreatitis. 2. Persistent  duodenal wall thickening and edema with abnormal rim enhancing fluid collection adjacent to the pancreatic head and proximal duodenum, as detailed above, similar to prior studies. Whether this represents a chronic pancreatic pseudocyst, atypical duodenal diverticulum, or chronic contained perforated duodenal ulcer is uncertain. Further evaluation with endoscopy and endoscopic ultrasound should be considered in the appropriate clinical setting. 3. Severe circumferential thickening of the distal esophagus, which may suggest esophagitis. This too could be evaluated and at endoscopy if clinically appropriate. 4. Mild colonic diverticulosis without evidence of acute diverticulitis at this time. 5. 4 mm right middle lobe pulmonary nodule, nonspecific, but stable compared to prior studies and statistically likely benign. No follow-up needed if patient is low-risk.This recommendation follows the consensus statement: Guidelines for Management of Incidental Pulmonary Nodules Detected on CT Images: From the Fleischner Society 2017; Radiology 2017; 284:228-243. 6. Aortic atherosclerosis.  Electronically Signed   By: Trudie Reed M.D.   On: 07/27/2022 08:50     Scheduled inpatient medications:   pantoprazole (PROTONIX) IV  40 mg Intravenous Q24H   sodium chloride flush  10-40 mL Intracatheter Q12H   Continuous inpatient infusions:   dextrose 5 % and 0.9% NaCl 100 mL/hr at 08/03/22 0030   PRN inpatient medications: acetaminophen **OR** acetaminophen, hydrALAZINE, HYDROmorphone (DILAUDID) injection, ondansetron **OR** ondansetron (ZOFRAN) IV, sodium chloride flush  Vital signs in last 24 hours: Temp:  [98.2 F (36.8 C)-99.1 F (37.3 C)] 99.1 F (37.3 C) (04/28 0735) Pulse Rate:  [51-65] 62 (04/28 0735) Resp:  [16-20] 18 (04/28 0735) BP: (137-173)/(71-104) 156/94 (  04/28 0735) SpO2:  [98 %-100 %] 99 % (04/28 0735) Last BM Date : 08/01/22  Intake/Output Summary (Last 24 hours) at  08/03/2022 0858 Last data filed at 08/03/2022 0839 Gross per 24 hour  Intake 10 ml  Output --  Net 10 ml    Intake/Output from previous day: No intake/output data recorded. Intake/Output this shift: Total I/O In: 10 [I.V.:10] Out: -    Physical Exam:  General: Alert male in NAD Heart:  Regular rate and rhythm.  Pulmonary: Normal respiratory effort Abdomen: Soft, nondistended, nontender. Normal bowel sounds. Extremities: No lower extremity edema  Neurologic: Alert and oriented Psych: Pleasant. Cooperative. Insight appears normal.    Principal Problem:   Chronic pancreatitis (HCC) Active Problems:   Hypertension   Duodenal obstruction   Dehydration   Abnormal CT of the abdomen   Pancreatic cyst   Nausea and vomiting   Pancreatic pseudocyst   Protein-calorie malnutrition, severe     LOS: 5 days   Willette Cluster ,NP 08/03/2022, 8:57 AM

## 2022-08-03 NOTE — Plan of Care (Signed)

## 2022-08-03 NOTE — Progress Notes (Signed)
PROGRESS NOTE    Isaac Meza  JXB:[id number] DOB: 06/15/1970 DOA: 07/29/2022 PCP: Margarita Mail, DO    Brief Narrative:   52 y.o. male with past medical history of/hypertension,recent appendectomy in 05/2022 (robotic assisted laparoscopic procedure), w presented to the ED on 06/06/2022 for mild pancreatitis and he was treated with supportive medical therapy, he did not require hospitalization.  Subsequently he had 1 week of 1 week of abdominal pain, epigastric in location, dull to sharp in nature, that has been persistent and worsening.  He was unable to eat and was constipated.  He took some oral analgesics but his pain persisted with vomiting and decreased tolerance to oral feeds so he presented to ED [facility].  Patient was put on IV fluids IV and NG tube initially and CT scan of the abdomen was performed which showed chronic pancreatic pseudocyst, with fluid collection, adjacent to the pancreatic head and duodenum. Surgery and GI were consulted and recommendations was to transfer patient to [facility] for further work up with endoscopic [place].  Patient has undergone endoscopic ultrasound and aspiration of the cystic lesion at this time.   Assessment and Plan:  * Chronic pancreatitis (HCC) Pancreatic pseudocyst Ileus   Continue. IV fluids antiemetics and analgesics with PPI and supportive care.  Currently on clears from NPO.  Was initially on NG tube which has been discontinued.  Patient underwent endoscopic ultrasound on 08/01/2022 with findings of large cystic lesion in the pancreatic head with some septation and internal debris's.  Needle aspiration was performed and 200 mL of fluid was removed and was sent for cytology.  Erosive gastropathy was noted.  Continue Protonix IV.  Follow GI recommendation on dietary recommendations.  On clears at this time.  Patient feels hungry.  Hypertension Patient is on hydrochlorothiazide and lisinopril at home.  Currently on clears.  On IV hydralazine.  Will  restart as needed.  Hold off with HCTZ for now.  Dehydration/volume depletion Currently on D5 normal saline at 100 mill per hour.  On clears.   DVT prophylaxis: SCDs Start: 07/29/22 1745   Code Status:     Code Status: Full Code  Disposition: Home likely in 2 to 3 days when okay with GI.  Status is: Inpatient Remains inpatient appropriate because: large cystic lesion status post aspiration,, GI following,   Family Communication:  None at bedside  Consultants:  GI  Procedures:  Endoscopic ultrasound 08/01/2022 with aspiration, NG tube insertion and removal  Antimicrobials:  None  Anti-infectives (From admission, onward)    None      Subjective: Today, patient was seen and examined at bedside.  Feels hungry.  Denies any nausea vomiting fever chills or rigor.  Has not had a bowel movement.   Objective: Vitals:   08/02/22 1625 08/02/22 2022 08/03/22 0341 08/03/22 0735  BP: 137/87 (!) 173/104 139/71 (!) 156/94  Pulse: 60 65 (!) 51 62  Resp: 16 20 18 18   Temp: 98.2 F (36.8 C) 98.3 F (36.8 C) 98.3 F (36.8 C) 99.1 F (37.3 C)  TempSrc: Oral Oral Oral Oral  SpO2: 100% 100% 98% 99%  Weight:      Height:        Intake/Output Summary (Last 24 hours) at 08/03/2022 0937 Last data filed at 08/03/2022 0839 Gross per 24 hour  Intake 10 ml  Output --  Net 10 ml    Filed Weights   07/29/22 1655 07/29/22 1700  Weight: 50 kg 50 kg    Physical Examination: Body mass index  is 17.26 kg/m.   General: Awake and Communicative, thinly built, not in obvious distress,  HENT:   No scleral pallor or icterus noted. Oral mucosa is moist.  Chest:  Clear breath sounds.  No crackles or wheezes.  CVS: S1 &S2 heard. No murmur.  Regular rate and rhythm. Abdomen: Soft.  Bowel sounds are heard.  Tenderness over the epigastric region. Extremities: No cyanosis, clubbing mild lower extremity edema.  Peripheral pulses are palpable. Psych: Alert, awake and oriented, normal mood CNS:   No cranial nerve deficits.  Power equal in all extremities.   Skin: Warm and dry.  No rashes noted.  Data Reviewed:   CBC: Recent Labs  Lab 07/30/22 0426 07/31/22 0558 08/01/22 0210 08/02/22 0403 08/03/22 0315  WBC 11.2* 10.5 11.2* 11.5* 6.3  HGB 16.5 15.0 14.2 12.8* 12.4*  HCT 45.9 44.8 42.9 37.7* 36.1*  MCV 90.0 94.3 96.0 94.3 92.3  PLT 304 288 297 268 275     Basic Metabolic Panel: Recent Labs  Lab 07/30/22 0426 07/30/22 1756 07/31/22 0558 08/01/22 0210 08/02/22 0403 08/03/22 0315  NA 134*  --  139 140 133* 136  K 2.9* 4.6 4.5 4.9 3.8 3.8  CL 95*  --  98 101 103 106  CO2 28  --  32 30 22 24   GLUCOSE 127*  --  117* 114* 110* 106*  BUN 12  --  15 16 12  <5*  CREATININE 0.71  --  0.80 0.91 0.70 0.66  CALCIUM 8.7*  --  9.1 9.0 8.2* 8.1*  MG 2.1  --  2.1  --   --  1.8     Liver Function Tests: Recent Labs  Lab 07/31/22 0558 08/01/22 0210 08/02/22 0403 08/03/22 0315  AST 11* 21 28 15   ALT 10 20 35 24  ALKPHOS 59 63 59 55  BILITOT 0.9 1.0 0.7 0.4  PROT 5.9* 6.0* 5.3* 4.9*  ALBUMIN 3.1* 3.0* 2.7* 2.5*      Radiology Studies: No results found.    LOS: 5 days   Joycelyn Das, MD [facility] Available via Epic secure chat 7am-7pm After these hours, please refer to coverage provider listed on [email or web address] 08/03/2022, 9:37 AM

## 2022-08-04 ENCOUNTER — Encounter (HOSPITAL_COMMUNITY): Payer: Self-pay | Admitting: Gastroenterology

## 2022-08-04 DIAGNOSIS — K860 Alcohol-induced chronic pancreatitis: Secondary | ICD-10-CM

## 2022-08-04 DIAGNOSIS — K259 Gastric ulcer, unspecified as acute or chronic, without hemorrhage or perforation: Secondary | ICD-10-CM | POA: Diagnosis not present

## 2022-08-04 DIAGNOSIS — D649 Anemia, unspecified: Secondary | ICD-10-CM | POA: Diagnosis not present

## 2022-08-04 DIAGNOSIS — K315 Obstruction of duodenum: Secondary | ICD-10-CM | POA: Diagnosis not present

## 2022-08-04 DIAGNOSIS — I10 Essential (primary) hypertension: Secondary | ICD-10-CM | POA: Diagnosis not present

## 2022-08-04 DIAGNOSIS — K863 Pseudocyst of pancreas: Secondary | ICD-10-CM | POA: Diagnosis not present

## 2022-08-04 LAB — COMPREHENSIVE METABOLIC PANEL
ALT: 22 U/L (ref 0–44)
AST: 13 U/L — ABNORMAL LOW (ref 15–41)
Albumin: 2.5 g/dL — ABNORMAL LOW (ref 3.5–5.0)
Alkaline Phosphatase: 61 U/L (ref 38–126)
Anion gap: 5 (ref 5–15)
BUN: <5 mg/dL — ABNORMAL LOW (ref 6–20)
CO2: 27 mmol/L (ref 22–32)
Calcium: 8 mg/dL — ABNORMAL LOW (ref 8.9–10.3)
Chloride: 105 mmol/L (ref 98–111)
Creatinine, Ser: 0.79 mg/dL (ref 0.61–1.24)
GFR, Estimated: >60 mL/min (ref >60)
Glucose, Bld: 111 mg/dL — ABNORMAL HIGH (ref 70–99)
Potassium: 3.3 mmol/L — ABNORMAL LOW (ref 3.5–5.1)
Sodium: 137 mmol/L (ref 135–145)
Total Bilirubin: 0.5 mg/dL (ref 0.3–1.2)
Total Protein: 5 g/dL — ABNORMAL LOW (ref 6.5–8.1)

## 2022-08-04 LAB — CBC
HCT: 36.8 % — ABNORMAL LOW (ref 39.0–52.0)
Hemoglobin: 12.6 g/dL — ABNORMAL LOW (ref 13.0–17.0)
MCH: 32 pg (ref 26.0–34.0)
MCHC: 34.2 g/dL (ref 30.0–36.0)
MCV: 93.4 fL (ref 80.0–100.0)
Platelets: 310 10*3/uL (ref 150–400)
RBC: 3.94 MIL/uL — ABNORMAL LOW (ref 4.22–5.81)
RDW: 11.7 % (ref 11.5–15.5)
WBC: 6.4 10*3/uL (ref 4.0–10.5)
nRBC: 0 % (ref 0.0–0.2)

## 2022-08-04 MED ORDER — OXYCODONE HCL 5 MG PO TABS
5.0000 mg | ORAL_TABLET | ORAL | Status: DC | PRN
  Administered 2022-08-04 (×3): 5 mg via ORAL
  Filled 2022-08-04 (×3): qty 1

## 2022-08-04 MED ORDER — PANTOPRAZOLE SODIUM 40 MG IV SOLR
40.0000 mg | Freq: Two times a day (BID) | INTRAVENOUS | Status: DC
  Administered 2022-08-04: 40 mg via INTRAVENOUS
  Filled 2022-08-04: qty 10

## 2022-08-04 MED ORDER — POTASSIUM CHLORIDE CRYS ER 20 MEQ PO TBCR
40.0000 meq | EXTENDED_RELEASE_TABLET | Freq: Once | ORAL | Status: AC
  Administered 2022-08-04: 40 meq via ORAL
  Filled 2022-08-04: qty 2

## 2022-08-04 MED ORDER — LISINOPRIL 20 MG PO TABS
20.0000 mg | ORAL_TABLET | Freq: Every day | ORAL | Status: DC
  Administered 2022-08-04: 20 mg via ORAL
  Filled 2022-08-04: qty 1

## 2022-08-04 NOTE — Progress Notes (Addendum)
Attending physician's note   I have taken a history, reviewed the chart, and examined the patient. I performed a substantive portion of this encounter, including complete performance of at least one of the key components, in conjunction with the APP. I agree with the APP's note, impression, and recommendations with my edits.   Pain improved and patient open to transition from IV to oral pain medication.  Tolerated a full breakfast this morning without any nausea/vomiting.  Discussed with the lab earlier today.  The fluid that was collected on 4/26 is apparently a send out lab will not be back for a few days (amylase, glucose, CEA).  Cytology pending.  Otherwise, appeared that the cyst collapsed appropriately intraoperatively during the EUS.  Isaac Meza has otherwise improved clinically as well.  - Transition back to home pain medication regimen that Isaac Meza was on prior to admission (taking for chronic back pain) - Continue advancing diet as tolerated - Needs complete EtOH cessation - Plan for repeat CT abdomen as an outpatient in 3-4 weeks for reassessment of pseudocyst and whether or not this has resolved vs needing pseudocyst drainage down the line.  Will assess for resolution of the 10 x 11 mm enlarged lymph node noted at the time of EUS, which could not be sampled due to moderately sized vessel coursing adjacent to the LN - Inpatient GI service will follow peripherally at this juncture.  Will start making arrangements for outpatient follow-up.  Kayvan Hoefling, DO, FACG [phone] office         Progress Note  Primary GI: Unassigned  LOS: 6 days   Chief Complaint: Pancreatic pseudocyst   Subjective  Patient states Isaac Meza is doing better. Yesterday tried solid foods and it resulted in abdominal pain. This morning Isaac Meza is taking it slow and has no pain. Small bowel movement yesterday.  No family was present at the time of my evaluation.    Objective   Vital signs in last 24  hours: Temp:  [98.2 F (36.8 C)-99 F (37.2 C)] 99 F (37.2 C) (04/29 0733) Pulse Rate:  [51-62] 62 (04/29 0733) Resp:  [18] 18 (04/29 0733) BP: (128-161)/(70-104) 161/95 (04/29 0733) SpO2:  [98 %-100 %] 100 % (04/29 0733) Last BM Date : 08/03/22 Last BM recorded by nurses in past 5 days No data recorded  General:   male in no acute distress  Heart:  Regular rate and rhythm; no murmurs Pulm: Clear anteriorly; no wheezing Abdomen: soft, nondistended, normal bowel sounds in all quadrants. Nontender without guarding. No organomegaly appreciated. Extremities:  No edema Neurologic:  Alert and  oriented x4;  No focal deficits.  Psych:  Cooperative. Normal mood and affect.  Intake/Output from previous day: 04/28 0701 - 04/29 0700 In: 250 [P.O.:240; I.V.:10] Out: -  Intake/Output this shift: Total I/O In: 10 [I.V.:10] Out: -   Studies/Results: No results found.  Lab Results: Recent Labs    08/02/22 0403 08/03/22 0315 08/04/22 0406  WBC 11.5* 6.3 6.4  HGB 12.8* 12.4* 12.6*  HCT 37.7* 36.1* 36.8*  PLT 268 275 310   BMET Recent Labs    08/02/22 0403 08/03/22 0315 08/04/22 0406  NA 133* 136 137  K 3.8 3.8 3.3*  CL 103 106 105  CO2 22 24 27   GLUCOSE 110* 106* 111*  BUN 12 <5* <5*  CREATININE 0.70 0.66 0.79  CALCIUM 8.2* 8.1* 8.0*   LFT Recent Labs    08/04/22 0406  PROT 5.0*  ALBUMIN 2.5*  AST 13*  ALT 22  ALKPHOS 61  BILITOT 0.5   PT/INR No results for input(s): "LABPROT", "INR" in the last 72 hours.   Scheduled Meds:  pantoprazole (PROTONIX) IV  40 mg Intravenous Q24H   sodium chloride flush  10-40 mL Intracatheter Q12H   Continuous Infusions:  dextrose 5 % and 0.9% NaCl 100 mL/hr at 08/04/22 0654      Patient profile:   52 year old male with past medical history of pancreatitis, hypertension.  Admitted with nausea vomiting and abdominal pain.  CT showed pancreatic pseudocyst that appeared to communicate with duodenal wall causing duodenal  obstruction. NG tube was placed. Patient underwent EUS with FNA.    Impression:   Duodenal obstruction secondary to pancreatic pseudocyst  - CT abdomen with chronic 3.9 x 4.0 x 5.7 cm pancreatic pseudocyst, with fluid collection, adjacent to the pancreatic head and duodenum. Differential at the level of the duodenum also includes duodenal diverticulum and contained perforated duodenal ulcer  - normal LFTs - lipase 124 - no leukocytosis - EUS with FNA with Dr. Elnoria Howard 4/26: 32 mm x 32mm pancreatic head cyst does not appear to communicate with the pancreatic duct and the pancreatic duct appears normal. There was no associated mass. There was internal debris within the fluid- filled cavity. Diagnostic needle aspiration of 20cc for fluid was performed. Sample( s) were sent for amylase concentration, glucose, cytology and CEA. One 10mm x 11 mm enlarged lymph node was visualized in the peripancreatic region. A moderately sized vessel coursed adjacent to the LN and an FNA was not possible. This region surrounding the cyst appeared to be inflammed and many vessels were coursing through the area. After the aspiration the cyst was remeasured to be 1 cc smaller at 22 x 22 cm.   Gastric erosion, possibly NGT trauma   [place] anemia. Probably dilutional Hgb previously normal, now in 12 range   Alcohol abuse Hypertension   Plan:   - Cytology pending, await results. - Continue diet as tolerated - Continue PPI IV BID  Isaac Meza  08/04/2022, 9:04 AM

## 2022-08-04 NOTE — Progress Notes (Signed)
PROGRESS NOTE    Isaac Meza  BJY:[id number] DOB: [birth year] DOA: 07/29/2022 PCP: Margarita Mail, DO    Brief Narrative:   52 y.o. male with past medical history of/hypertension,recent appendectomy in 05/2022 (robotic assisted laparoscopic procedure), w presented to the ED on 06/06/2022 for mild pancreatitis and he was treated with supportive medical therapy, he did not require hospitalization.  Subsequently he had 1 week of 1 week of abdominal pain, epigastric in location, dull to sharp in nature, that has been persistent and worsening.  He was unable to eat and was constipated.  He took some oral analgesics but his pain persisted with vomiting and decreased tolerance to oral feeds so he presented to ED [facility].  Patient was put on IV fluids IV and NG tube initially and CT scan of the abdomen was performed which showed chronic pancreatic pseudocyst, with fluid collection, adjacent to the pancreatic head and duodenum. Surgery and GI were consulted and recommendations was to transfer patient to [facility] for further work up with endoscopic [place].  Patient has undergone endoscopic ultrasound and aspiration of the cystic lesion at this time.   Subjective: Seen and examined this morning tolerating diet agreeable to wean down on his pain medication  Assessment and Plan:  Chronic pancreatitis Pancreatic pseudocyst causing Duodenal obstruction  Ileus: S/p  EGD w/ EUS w/ aspiration of pseudocyst 4/26, erosive gastropathy was noted.GI following for management.  Cytology pending, on regular diet. Last BM 4/28, continue Protonix IV.  Discontinue IV opiates transition to oral oxycodone and monitor wean off IV fluids.  Appreciate GI input  Hypertension on hydrochlorothiazide and lisinopril at home,resume lisinopril Hypokalemia replace per Dehydration/volume depletion: Currently on D5 ns, diet. Severe malnutrition augment diet as below Nutrition Problem: Severe Malnutrition Etiology: chronic illness  (pancreatitis, recent appendectomy) Signs/Symptoms: severe fat depletion, moderate fat depletion, severe muscle depletion, percent weight loss Percent weight loss: 22.4 % Interventions: Refer to RD note for recommendations   DVT prophylaxis: SCDs Start: 07/29/22 1745 Code Status:     Code Status: Full Code  Disposition: Home likely tomorrow  Status is: Inpatient Remains inpatient appropriate because: large cystic lesion status post aspiration,, GI following,   Family Communication:  None at bedside  Consultants:  GI  Procedures:  Endoscopic ultrasound 08/01/2022 with aspiration, NG tube insertion and removal  Antimicrobials:  None  Anti-infectives (From admission, onward)    None      Objective: Vitals:   08/03/22 1525 08/03/22 2019 08/04/22 0313 08/04/22 0733  BP: (!) 150/97 (!) 159/104 128/70 (!) 161/95  Pulse: (!) 51 61 (!) 53 62  Resp: 18 18 18 18   Temp: 98.4 F (36.9 C) 98.3 F (36.8 C) 98.2 F (36.8 C) 99 F (37.2 C)  TempSrc: Oral Oral Oral Oral  SpO2: 99% 100% 98% 100%  Weight:      Height:        Intake/Output Summary (Last 24 hours) at 08/04/2022 1349 Last data filed at 08/04/2022 0805 Gross per 24 hour  Intake 10 ml  Output --  Net 10 ml    Filed Weights   07/29/22 1655 07/29/22 1700  Weight: 50 kg 50 kg    Physical Examination: General exam: AAox3, weak,older appearing HEENT:Oral mucosa moist, Ear/Nose WNL grossly, dentition normal. Respiratory system: bilaterally clear BS, no use of accessory muscle Cardiovascular system: S1 & S2 +, regular rate,. Gastrointestinal system: Abdomen soft, NT,ND,BS+ Nervous System:Alert, awake, moving extremities and grossly nonfocal Extremities: LE ankle edema neg, lower extremities warm Skin: No rashes,no icterus.  MSK: Normal muscle bulk,tone, power   Data Reviewed:   CBC: Recent Labs  Lab 07/31/22 0558 08/01/22 0210 08/02/22 0403 08/03/22 0315 08/04/22 0406  WBC 10.5 11.2* 11.5* 6.3 6.4   HGB 15.0 14.2 12.8* 12.4* 12.6*  HCT 44.8 42.9 37.7* 36.1* 36.8*  MCV 94.3 96.0 94.3 92.3 93.4  PLT 288 297 268 275 310     Basic Metabolic Panel: Recent Labs  Lab 07/30/22 0426 07/30/22 1756 07/31/22 0558 08/01/22 0210 08/02/22 0403 08/03/22 0315 08/04/22 0406  NA 134*  --  139 140 133* 136 137  K 2.9*   < > 4.5 4.9 3.8 3.8 3.3*  CL 95*  --  98 101 103 106 105  CO2 28  --  32 30 22 24 27   GLUCOSE 127*  --  117* 114* 110* 106* 111*  BUN 12  --  15 16 12  <5* <5*  CREATININE 0.71  --  0.80 0.91 0.70 0.66 0.79  CALCIUM 8.7*  --  9.1 9.0 8.2* 8.1* 8.0*  MG 2.1  --  2.1  --   --  1.8  --    < > = values in this interval not displayed.     Liver Function Tests: Recent Labs  Lab 07/31/22 0558 08/01/22 0210 08/02/22 0403 08/03/22 0315 08/04/22 0406  AST 11* 21 28 15  13*  ALT 10 20 35 24 22  ALKPHOS 59 63 59 55 61  BILITOT 0.9 1.0 0.7 0.4 0.5  PROT 5.9* 6.0* 5.3* 4.9* 5.0*  ALBUMIN 3.1* 3.0* 2.7* 2.5* 2.5*      Radiology Studies: No results found.    LOS: 6 days   Lanae Boast, MD [facility] Available via Epic secure chat 7am-7pm After these hours, please refer to coverage provider listed on [email or web address] 08/04/2022, 1:49 PM

## 2022-08-04 NOTE — Plan of Care (Signed)
  Problem: Pain Managment: Goal: General experience of comfort will improve Outcome: Progressing   Problem: Safety: Goal: Ability to remain free from injury will improve Outcome: Progressing   Problem: Skin Integrity: Goal: Risk for impaired skin integrity will decrease Outcome: Progressing   

## 2022-08-04 NOTE — Progress Notes (Signed)
Per Corrie Dandy in lab, they do not have fluid sample for pseudocyst. Doristine Locks, DO aware.

## 2022-08-05 DIAGNOSIS — K860 Alcohol-induced chronic pancreatitis: Secondary | ICD-10-CM | POA: Diagnosis not present

## 2022-08-05 LAB — GLUCOSE, CAPILLARY: Glucose-Capillary: 99 mg/dL (ref 70–99)

## 2022-08-05 LAB — CYTOLOGY - NON PAP

## 2022-08-05 MED ORDER — PANTOPRAZOLE SODIUM 40 MG PO TBEC: 40.0000 mg | DELAYED_RELEASE_TABLET | Freq: Two times a day (BID) | ORAL | 0 refills | Status: DC

## 2022-08-05 NOTE — Discharge Summary (Signed)
Physician Discharge Summary  Isaac Meza ZOX:[id number] DOB: May 08, 1970 DOA: 07/29/2022  PCP: Margarita Mail, DO  Admit date: 07/29/2022 Discharge date: 08/05/2022 Recommendations for Outpatient Follow-up:  Follow up with PCP in 1 weeks-call for appointment Please obtain BMP/CBC in one week Follow-up with GI for repeat CT scan  Discharge Dispo: home Discharge Condition: Stable Code Status:   Code Status: Full Code Diet recommendation:  Diet Order             Diet regular Room service appropriate? Yes; Fluid consistency: Thin  Diet effective now                    Brief/Interim Summary: 52 y.o. male with past medical history of/hypertension,recent appendectomy in 05/2022 (robotic assisted laparoscopic procedure), w presented to the ED on 06/06/2022 for mild pancreatitis and he was treated with supportive medical therapy, he did not require hospitalization.  Subsequently he had 1 week of 1 week of abdominal pain, epigastric in location, dull to sharp in nature, that has been persistent and worsening.  He was unable to eat and was constipated.  He took some oral analgesics but his pain persisted with vomiting and decreased tolerance to oral feeds so he presented to ED [facility].  Patient was put on IV fluids IV and NG tube initially and CT scan of the abdomen was performed which showed chronic pancreatic pseudocyst, with fluid collection, adjacent to the pancreatic head and duodenum. Surgery and GI were consulted and recommendations was to transfer patient to [facility] for further work up with endoscopic [place].  Patient has undergone endoscopic ultrasound and aspiration of the cystic lesion w/ 200 cc aspiration and sent for  analysis  Postprocedure did very well had diet advanced and tolerating, fluid analysis and cytology from pseudocyst are still pending, went off IV pain medication doing well on home oxycodone, GI signed off, he is being discharged home in stable condition with instruction for  follow-up with GI as outpatient.  Discharge Diagnoses:  Principal Problem:   Chronic pancreatitis (HCC) Active Problems:   Hypertension   Dehydration   Duodenal obstruction   Abnormal CT of the abdomen   Pancreatic cyst   Nausea and vomiting   Pancreatic pseudocyst   Protein-calorie malnutrition, severe  Chronic pancreatitis Pancreatic pseudocyst causing Duodenal obstruction  Ileus: S/p  EGD w/ EUS w/ aspiration of pseudocyst 4/26, erosive gastropathy was noted.GI following for management.  Cytology pending> will follow-up and update patient, on regular diet. Last BM 4/28, continue Protonix and cont diet and fu with GI Hypertension on hydrochlorothiazide and lisinopril at home,resumed lisinopril Hypokalemia replaceD Dehydration/volume depletion: resolved Severe malnutrition augment diet as below Nutrition Problem: Severe Malnutrition Etiology: chronic illness (pancreatitis, recent appendectomy) Signs/Symptoms: severe fat depletion, moderate fat depletion, severe muscle depletion, percent weight loss Percent weight loss: 22.4 % Interventions: Refer to RD note for recommendations  Procedures:  Endoscopic ultrasound 08/01/2022 with aspiration, NG tube insertion and removal  Antimicrobials:  None   Consults: Gastroenterology Subjective: Alert awake oriented no pain, tolerating diet, ready for discharge home today  Discharge Exam: Vitals:   08/05/22 0353 08/05/22 0745  BP: 135/76 (!) 143/96  Pulse: 62 74  Resp: 18 18  Temp: 98.9 F (37.2 C) 98.3 F (36.8 C)  SpO2: 99% 100%   General: Pt is alert, awake, not in acute distress Cardiovascular: RRR, S1/S2 +, no rubs, no gallops Respiratory: CTA bilaterally, no wheezing, no rhonchi Abdominal: Soft, NT, ND, bowel sounds + Extremities: no edema, no cyanosis  Discharge Instructions  Discharge Instructions     Discharge instructions   Complete by: As directed    Repeat ct abd in 3-4 wks. Fu studies from cyst from GI  office   Please call call MD or return to ER for similar or worsening recurring problem that brought you to hospital or if any fever,nausea/vomiting,abdominal pain, uncontrolled pain, chest pain,  shortness of breath or any other alarming symptoms.  Please follow-up your doctor as instructed in a week time and call the office for appointment.  Please avoid alcohol, smoking, or any other illicit substance and maintain healthy habits including taking your regular medications as prescribed.  You were cared for by a hospitalist during your hospital stay. If you have any questions about your discharge medications or the care you received while you were in the hospital after you are discharged, you can call the unit and ask to speak with the hospitalist on call if the hospitalist that took care of you is not available.  Once you are discharged, your primary care physician will handle any further medical issues. Please note that NO REFILLS for any discharge medications will be authorized once you are discharged, as it is imperative that you return to your primary care physician (or establish a relationship with a primary care physician if you do not have one) for your aftercare needs so that they can reassess your need for medications and monitor your lab values   Increase activity slowly   Complete by: As directed       Allergies as of 08/05/2022   No Known Allergies      Medication List     STOP taking these medications    hydrochlorothiazide 12.5 MG capsule Commonly known as: MICROZIDE       TAKE these medications    acetaminophen 500 MG tablet Commonly known as: TYLENOL Take 2 tablets (1,000 mg total) by mouth every 6 (six) hours as needed for mild pain.   ibuprofen 600 MG tablet Commonly known as: ADVIL Take 1 tablet (600 mg total) by mouth every 8 (eight) hours as needed for moderate pain.   lisinopril 20 MG tablet Commonly known as: ZESTRIL Take 1 tablet (20 mg total) by  mouth daily.   ondansetron 8 MG disintegrating tablet Commonly known as: ZOFRAN-ODT Take 1 tablet (8 mg total) by mouth every 8 (eight) hours as needed for nausea or vomiting.   oxyCODONE 5 MG immediate release tablet Commonly known as: Oxy IR/ROXICODONE Take 1 tablet (5 mg total) by mouth every 4 (four) hours as needed for severe pain.   pantoprazole 40 MG tablet Commonly known as: Protonix Take 1 tablet (40 mg total) by mouth 2 (two) times daily.        Follow-up Information     Cirigliano, Vito V, DO Follow up in 1 week(s).   Specialty: Gastroenterology Contact information: [address] [place] [address] [phone]                No Known Allergies  The results of significant diagnostics from this hospitalization (including imaging, microbiology, ancillary and laboratory) are listed below for reference.    Microbiology: No results found for this or any previous visit (from the past 240 hour(s)).  Procedures/Studies: DG Abd 1 View  Result Date: 07/27/2022 CLINICAL DATA:  Enteric tube placement. EXAM: ABDOMEN - 1 VIEW COMPARISON:  Same day CT abdomen pelvis FINDINGS: An enteric tube terminates in the stomach with the side port near the gastroesophageal  junction. IMPRESSION: An enteric tube terminates in the stomach with the side port near the gastroesophageal junction. Electronically Signed   By: Romona Curls M.D.   On: 07/27/2022 12:32   CT ABDOMEN PELVIS W CONTRAST  Result Date: 07/27/2022 CLINICAL DATA:  52 year old male with history of generalized abdominal pain for the past 7 days. Severe pancreatitis. EXAM: CT ABDOMEN AND PELVIS WITH CONTRAST TECHNIQUE: Multidetector CT imaging of the abdomen and pelvis was performed using the standard protocol following bolus administration of intravenous contrast. RADIATION DOSE REDUCTION: This exam was performed according to the departmental dose-optimization program which includes automated exposure control,  adjustment of the mA and/or kV according to patient size and/or use of iterative reconstruction technique. CONTRAST:  OMNIPAQUE IOHEXOL 300 MG/ML  SOLN COMPARISON:  CT the abdomen and pelvis 06/06/2022. FINDINGS: Lower chest: 4 mm right middle lobe pulmonary nodule (axial image 22 of series 4), unchanged. Linear scarring in the base of the left lower lobe. Mild elevation of the left hemidiaphragm. Severe circumferential thickening of the distal esophagus. Hepatobiliary: No suspicious cystic or solid hepatic lesions. No intra or extrahepatic biliary ductal dilatation. Gallbladder is unremarkable in appearance. Pancreas: The body and tail of the pancreas are grossly normal in appearance. No pancreatic ductal dilatation. Adjacent to the pancreatic head there is a rim enhancing fluid collection (axial image 43 of series 2 and sagittal image 79 of series 6) measuring approximately 3.9 x 4.0 x 5.7 cm which is similar to prior studies dating back to 05/16/2022. Pancreatic head and uncinate process are otherwise grossly normal in appearance. Pancreatic parenchyma enhances normally. No overt surrounding peripancreatic inflammatory changes are noted on today's examination. Spleen: Unremarkable. Adrenals/Urinary Tract: Bilateral kidneys and bilateral adrenal glands are normal in appearance. No hydroureteronephrosis. Urinary bladder is normal in appearance. Stomach/Bowel: Stomach is distended and fluid-filled. Duodenal wall appears thickened and edematous, best appreciated on axial image 33 of series 2. Previously described peripancreatic rim enhancing fluid collection demonstrates communication with the medial wall of the proximal duodenal best appreciated on axial image 36 of series 2, coronal image 38 of series 5 and sagittal image 75 of series 6. No pathologic dilatation of small bowel or colon. A few scattered colonic diverticula are noted, without surrounding inflammatory changes to indicate an acute diverticulitis  at this time. Status post appendectomy. Vascular/Lymphatic: Atherosclerosis in the abdominal aorta and pelvic vasculature, without evidence of aneurysm or dissection. No lymphadenopathy noted in the abdomen or pelvis. Reproductive: Prostate gland and seminal vesicles are unremarkable in appearance. Other: No significant volume of ascites.  No pneumoperitoneum. Musculoskeletal: There are no aggressive appearing lytic or blastic lesions noted in the visualized portions of the skeleton. IMPRESSION: 1. No definite peripancreatic inflammatory changes to indicate complicated pancreatitis. 2. Persistent duodenal wall thickening and edema with abnormal rim enhancing fluid collection adjacent to the pancreatic head and proximal duodenum, as detailed above, similar to prior studies. Whether this represents a chronic pancreatic pseudocyst, atypical duodenal diverticulum, or chronic contained perforated duodenal ulcer is uncertain. Further evaluation with endoscopy and endoscopic ultrasound should be considered in the appropriate clinical setting. 3. Severe circumferential thickening of the distal esophagus, which may suggest esophagitis. This too could be evaluated and at endoscopy if clinically appropriate. 4. Mild colonic diverticulosis without evidence of acute diverticulitis at this time. 5. 4 mm right middle lobe pulmonary nodule, nonspecific, but stable compared to prior studies and statistically likely benign. No follow-up needed if patient is low-risk.This recommendation follows the consensus statement: Guidelines for Management  of Incidental Pulmonary Nodules Detected on CT Images: From the Fleischner Society 2017; Radiology 2017; 284:228-243. 6. Aortic atherosclerosis. Electronically Signed   By: Trudie Reed M.D.   On: 07/27/2022 08:50    Labs: BNP (last 3 results) No results for input(s): "BNP" in the last 8760 hours. Basic Metabolic Panel: Recent Labs  Lab 07/30/22 0426 07/30/22 1756 07/31/22 0558  08/01/22 0210 08/02/22 0403 08/03/22 0315 08/04/22 0406  NA 134*  --  139 140 133* 136 137  K 2.9*   < > 4.5 4.9 3.8 3.8 3.3*  CL 95*  --  98 101 103 106 105  CO2 28  --  32 30 22 24 27   GLUCOSE 127*  --  117* 114* 110* 106* 111*  BUN 12  --  15 16 12  <5* <5*  CREATININE 0.71  --  0.80 0.91 0.70 0.66 0.79  CALCIUM 8.7*  --  9.1 9.0 8.2* 8.1* 8.0*  MG 2.1  --  2.1  --   --  1.8  --    < > = values in this interval not displayed.   Liver Function Tests: Recent Labs  Lab 07/31/22 0558 08/01/22 0210 08/02/22 0403 08/03/22 0315 08/04/22 0406  AST 11* 21 28 15  13*  ALT 10 20 35 24 22  ALKPHOS 59 63 59 55 61  BILITOT 0.9 1.0 0.7 0.4 0.5  PROT 5.9* 6.0* 5.3* 4.9* 5.0*  ALBUMIN 3.1* 3.0* 2.7* 2.5* 2.5*   No results for input(s): "LIPASE", "AMYLASE" in the last 168 hours. No results for input(s): "AMMONIA" in the last 168 hours. CBC: Recent Labs  Lab 07/31/22 0558 08/01/22 0210 08/02/22 0403 08/03/22 0315 08/04/22 0406  WBC 10.5 11.2* 11.5* 6.3 6.4  HGB 15.0 14.2 12.8* 12.4* 12.6*  HCT 44.8 42.9 37.7* 36.1* 36.8*  MCV 94.3 96.0 94.3 92.3 93.4  PLT 288 297 268 275 310   Cardiac Enzymes: No results for input(s): "CKTOTAL", "CKMB", "CKMBINDEX", "TROPONINI" in the last 168 hours. BNP: Invalid input(s): "POCBNP" CBG: Recent Labs  Lab 08/05/22 0746  GLUCAP 99   D-Dimer No results for input(s): "DDIMER" in the last 72 hours. Hgb A1c No results for input(s): "HGBA1C" in the last 72 hours. Lipid Profile No results for input(s): "CHOL", "HDL", "LDLCALC", "TRIG", "CHOLHDL", "LDLDIRECT" in the last 72 hours. Thyroid function studies No results for input(s): "TSH", "T4TOTAL", "T3FREE", "THYROIDAB" in the last 72 hours.  Invalid input(s): "FREET3" Anemia work up No results for input(s): "VITAMINB12", "FOLATE", "FERRITIN", "TIBC", "IRON", "RETICCTPCT" in the last 72 hours. Urinalysis    Component Value Date/Time   COLORURINE YELLOW (A) 07/27/2022 1705   APPEARANCEUR  CLEAR (A) 07/27/2022 1705   LABSPEC >1.046 (H) 07/27/2022 1705   PHURINE 6.0 07/27/2022 1705   GLUCOSEU NEGATIVE 07/27/2022 1705   HGBUR NEGATIVE 07/27/2022 1705   BILIRUBINUR NEGATIVE 07/27/2022 1705   KETONESUR 80 (A) 07/27/2022 1705   PROTEINUR NEGATIVE 07/27/2022 1705   NITRITE NEGATIVE 07/27/2022 1705   LEUKOCYTESUR NEGATIVE 07/27/2022 1705   Sepsis Labs Recent Labs  Lab 08/01/22 0210 08/02/22 0403 08/03/22 0315 08/04/22 0406  WBC 11.2* 11.5* 6.3 6.4   Microbiology No results found for this or any previous visit (from the past 240 hour(s)).   Time coordinating discharge: 25 minutes  SIGNED: Lanae Boast, MD  [facility] 08/05/2022, 11:22 AM  If 7PM-7AM, please contact night-coverage [email or web address]

## 2022-08-05 NOTE — Plan of Care (Signed)

## 2022-08-13 NOTE — Progress Notes (Signed)
Isaac Amy, PA-C [address]  [address]  [place], [place] [address]  Main: [phone]  Fax: [phone]   Gastroenterology Consultation  Referring Provider:     Margarita Mail, DO Primary Care Physician:  Isaac Mail, DO Primary Gastroenterologist:  Isaac Amy, PA-C / Dr. Wyline Meza  Reason for Consultation:     Epigastric Pain, Complicated Pancreatitis with Pseudocyst        HPI:   Isaac Meza is a 52 y.o. y/o male referred for consultation & management  by Isaac Mail, DO.    He had laparoscopic appendectomy for acute appendicitis 05/16/2022.  Patient was seen at [facility] ED 06/06/2022 for acute mild pancreatitis of the pancreatic head seen on CT.  Was treated conservatively and discharged home.  He returned to [facility] ED 07/27/2022 with severe upper abdominal pain / pancreatitis and was admitted.  He was given IV fluids and NG tube initially.  CT scan of abdomen showed chronic chronic pancreatic pseudocyst with fluid collection, adjacent to the pancreatic head and duodenum.  Surgery and GI were consulted.  Patient was transferred to [facility] for further workup with endoscopic ultrasound.  He had EUS and aspiration of the cystic lesion by Isaac Meza 08/01/22 with 200 cc aspirated and sent for analysis.  Fluid from the cyst showed NO malignant cells.  There was erosive gastropathy noted on EGD.  He was started on Protonix 40 Mg twice daily.  Postprocedure he did well and was discharged home.  Told to follow-up with GI outpatient.  Pancreatic pseudocyst aspiration fluid analysis shows NO malignant cells identified.  He is currently feeling 50% better.  Still has some generalized upper abdominal pain.  Denies diarrhea or constipation.  He states he has been drinking 3 shots of vodka every day for many years.  Since his hospitalization he has decreased alcohol, yet not completely abstained.  He continues to have episodes of upper abdominal pain which comes  and goes.  Pain last 30 minutes to an hour and resolved.  Eating has improved.  He denies any nausea or vomiting.  Having normal bowel movement every day with no diarrhea, constipation, melena, or hematochezia.  Never had colonoscopy.  No recent cholesterol or triglyceride labs.  Patient has been malnourished.  Etiology for pancreatitis was thought due to alcohol, chronic illness, and recent appendectomy.  He has lost 30 pounds since 05/2022.  He is currently taking lisinopril.  Not on HCTZ. (Was discontinued).  Taking Zofran as needed nausea.  Currently taking Protonix 40 Mg twice daily.  Oxycodone as needed pain.  He has follow-up with his PCP tomorrow for hypertension.  Past Medical History:  Diagnosis Date   Asthma    childhood   Hypertension     Past Surgical History:  Procedure Laterality Date   BACK SURGERY     ESOPHAGOGASTRODUODENOSCOPY (EGD) WITH PROPOFOL N/A 08/01/2022   Procedure: ESOPHAGOGASTRODUODENOSCOPY (EGD) WITH PROPOFOL;  Surgeon: Isaac Hawking, MD;  Location: [facility] ENDOSCOPY;  Service: Gastroenterology;  Laterality: N/A;   EUS N/A 08/01/2022   Procedure: UPPER ENDOSCOPIC ULTRASOUND (EUS) LINEAR;  Surgeon: Isaac Hawking, MD;  Location: [facility] ENDOSCOPY;  Service: Gastroenterology;  Laterality: N/A;   FINE NEEDLE ASPIRATION  08/01/2022   Procedure: FINE NEEDLE ASPIRATION (FNA) LINEAR;  Surgeon: Isaac Hawking, MD;  Location: [facility] ENDOSCOPY;  Service: Gastroenterology;;   FRACTURE SURGERY Right 2007   arm   XI ROBOTIC LAPAROSCOPIC ASSISTED APPENDECTOMY N/A 05/16/2022   Procedure: XI ROBOTIC LAPAROSCOPIC ASSISTED APPENDECTOMY;  Surgeon: Isaac Meza,  Isaac Quick, MD;  Location: [facility] ORS;  Service: General;  Laterality: N/A;    Prior to Admission medications   Medication Sig Start Date End Date Taking? Authorizing Provider  acetaminophen (TYLENOL) 500 MG tablet Take 2 tablets (1,000 mg total) by mouth every 6 (six) hours as needed for mild pain. 05/16/22   Isaac Dodge, MD  ibuprofen (ADVIL) 600 MG  tablet Take 1 tablet (600 mg total) by mouth every 8 (eight) hours as needed for moderate pain. 05/16/22   Isaac Dodge, MD  lisinopril (ZESTRIL) 20 MG tablet Take 1 tablet (20 mg total) by mouth daily. 05/29/22   Isaac Mail, DO  ondansetron (ZOFRAN-ODT) 8 MG disintegrating tablet Take 1 tablet (8 mg total) by mouth every 8 (eight) hours as needed for nausea or vomiting. 07/27/22   Isaac Katos, MD  oxyCODONE (OXY IR/ROXICODONE) 5 MG immediate release tablet Take 1 tablet (5 mg total) by mouth every 4 (four) hours as needed for severe pain. 05/16/22   Isaac Dodge, MD  pantoprazole (PROTONIX) 40 MG tablet Take 1 tablet (40 mg total) by mouth 2 (two) times daily. 08/05/22 09/04/22  Isaac Boast, MD    No family history on file.   Social History   Tobacco Use   Smoking status: Every Day    Packs/day: .5    Types: Cigarettes   Smokeless tobacco: Never  Vaping Use   Vaping Use: Never used  Substance Use Topics   Alcohol use: Yes    Alcohol/week: 21.0 standard drinks of alcohol    Types: 21 Shots of liquor per week    Comment: 3 shots of liquor everyday   Drug use: Not Currently    Allergies as of 08/14/2022   (No Known Allergies)    Review of Systems:    All systems reviewed and negative except where noted in HPI.   Physical Exam:  There were no vitals taken for this visit. No LMP for male patient. Psych:  Alert and cooperative. Normal Meza and affect. General:   Alert,  Well-developed, well-nourished, pleasant and cooperative in NAD Head:  Normocephalic and atraumatic. Eyes:  Sclera clear, no icterus.   Conjunctiva pink. Ears:  Normal auditory acuity. Neck:  Supple; no masses or thyromegaly. Lungs:  Respirations even and unlabored.  Clear throughout to auscultation.   No wheezes, crackles, or rhonchi. No acute distress. Heart:  Regular rate and rhythm; no murmurs, clicks, rubs, or gallops. Abdomen:  Normal bowel sounds.  No bruits.  Soft, non-tender and non-distended  without masses, hepatosplenomegaly or hernias noted.  No guarding or rebound tenderness.    Neurologic:  Alert and oriented x3;  grossly normal neurologically. Psych:  Alert and cooperative. Normal Meza and affect.  Imaging Studies: DG Abd 1 View  Result Date: 07/27/2022 CLINICAL DATA:  Enteric tube placement. EXAM: ABDOMEN - 1 VIEW COMPARISON:  Same day CT abdomen pelvis FINDINGS: An enteric tube terminates in the stomach with the side port near the gastroesophageal junction. IMPRESSION: An enteric tube terminates in the stomach with the side port near the gastroesophageal junction. Electronically Signed   By: Isaac Meza M.D.   On: 07/27/2022 12:32   CT ABDOMEN PELVIS W CONTRAST  Result Date: 07/27/2022 CLINICAL DATA:  52 year old male with history of generalized abdominal pain for the past 7 days. Severe pancreatitis. EXAM: CT ABDOMEN AND PELVIS WITH CONTRAST TECHNIQUE: Multidetector CT imaging of the abdomen and pelvis was performed using the standard protocol following bolus administration of intravenous contrast. RADIATION DOSE REDUCTION:  This exam was performed according to the departmental dose-optimization program which includes automated exposure control, adjustment of the mA and/or kV according to patient size and/or use of iterative reconstruction technique. CONTRAST:  OMNIPAQUE IOHEXOL 300 MG/ML  SOLN COMPARISON:  CT the abdomen and pelvis 06/06/2022. FINDINGS: Lower chest: 4 mm right middle lobe pulmonary nodule (axial image 22 of series 4), unchanged. Linear scarring in the base of the left lower lobe. Mild elevation of the left hemidiaphragm. Severe circumferential thickening of the distal esophagus. Hepatobiliary: No suspicious cystic or solid hepatic lesions. No intra or extrahepatic biliary ductal dilatation. Gallbladder is unremarkable in appearance. Pancreas: The body and tail of the pancreas are grossly normal in appearance. No pancreatic ductal dilatation. Adjacent to the  pancreatic head there is a rim enhancing fluid collection (axial image 43 of series 2 and sagittal image 79 of series 6) measuring approximately 3.9 x 4.0 x 5.7 cm which is similar to prior studies dating back to 05/16/2022. Pancreatic head and uncinate process are otherwise grossly normal in appearance. Pancreatic parenchyma enhances normally. No overt surrounding peripancreatic inflammatory changes are noted on today's examination. Spleen: Unremarkable. Adrenals/Urinary Tract: Bilateral kidneys and bilateral adrenal glands are normal in appearance. No hydroureteronephrosis. Urinary bladder is normal in appearance. Stomach/Bowel: Stomach is distended and fluid-filled. Duodenal wall appears thickened and edematous, best appreciated on axial image 33 of series 2. Previously described peripancreatic rim enhancing fluid collection demonstrates communication with the medial wall of the proximal duodenal best appreciated on axial image 36 of series 2, coronal image 38 of series 5 and sagittal image 75 of series 6. No pathologic dilatation of small bowel or colon. A few scattered colonic diverticula are noted, without surrounding inflammatory changes to indicate an acute diverticulitis at this time. Status post appendectomy. Vascular/Lymphatic: Atherosclerosis in the abdominal aorta and pelvic vasculature, without evidence of aneurysm or dissection. No lymphadenopathy noted in the abdomen or pelvis. Reproductive: Prostate gland and seminal vesicles are unremarkable in appearance. Other: No significant volume of ascites.  No pneumoperitoneum. Musculoskeletal: There are no aggressive appearing lytic or blastic lesions noted in the visualized portions of the skeleton. IMPRESSION: 1. No definite peripancreatic inflammatory changes to indicate complicated pancreatitis. 2. Persistent duodenal wall thickening and edema with abnormal rim enhancing fluid collection adjacent to the pancreatic head and proximal duodenum, as detailed  above, similar to prior studies. Whether this represents a chronic pancreatic pseudocyst, atypical duodenal diverticulum, or chronic contained perforated duodenal ulcer is uncertain. Further evaluation with endoscopy and endoscopic ultrasound should be considered in the appropriate clinical setting. 3. Severe circumferential thickening of the distal esophagus, which may suggest esophagitis. This too could be evaluated and at endoscopy if clinically appropriate. 4. Mild colonic diverticulosis without evidence of acute diverticulitis at this time. 5. 4 mm right middle lobe pulmonary nodule, nonspecific, but stable compared to prior studies and statistically likely benign. No follow-up needed if patient is low-risk.This recommendation follows the consensus statement: Guidelines for Management of Incidental Pulmonary Nodules Detected on CT Images: From the Fleischner Society 2017; Radiology 2017; 284:228-243. 6. Aortic atherosclerosis. Electronically Signed   By: Isaac Meza M.D.   On: 07/27/2022 08:50    Assessment and Plan:   Bilal Kaller is a 52 y.o. y/o male has been referred for complicated pancreatitis with pseudocyst.  History of moderate alcohol use (21 drinks of liquor per week).  Onset started after he had emergent appendectomy for appendicitis 05/2022.  Underwent EGD/EUS with pancreatic cyst aspiration 08/01/2022 by Isaac Meza  at [facility].  No longer on HCTZ.  Complicated Pancreatitis with Pseudocyst (alcoholic pancreatitis)  STOP ALL ALCOHOL!!!  Support Resources discussed. Avoid HCTZ and all meds which can cause pancreatitis  Plan to repeat CT / MRI to follow-up with pseudocyst in 1 month. F/U OV with Isaac Meza in 1 month.  Labs:  Lipase, Fasting Lipid Panal, BMP, CBC  May need to start pancreas enzyme Creon or Zenpep in the future.  Patient handout given from up-to-date.  Discussed risk of developing diabetes.  Malnutrition  Start Boost / Ensure  Recommend low-fat healthy  diet with fruits, vegetables, and lean proteins.  S/P Appendectomy 05/2022 for Acute Appendicitis  4.  Erosive Gastropathy  Continue PPI Protonix 40mg  twice daily  Avoid NSAIDs  5.  Anemia  Labs: Iron & B12  6.  Colon Cancer Screening  Discuss colonoscopy versus Cologuard at next follow-up office visit.  7.  Hypertension  He has follow-up with his PCP tomorrow for treatment  Follow up in 4 - 6 weeks with DR. Shirline Frees, PA-C

## 2022-08-14 ENCOUNTER — Encounter: Payer: Self-pay | Admitting: Physician Assistant

## 2022-08-14 ENCOUNTER — Ambulatory Visit: Payer: Medicaid Other | Admitting: Physician Assistant

## 2022-08-14 VITALS — BP 156/116 | HR 92 | Temp 98.7°F | Ht 68.0 in | Wt 117.0 lb

## 2022-08-14 DIAGNOSIS — D649 Anemia, unspecified: Secondary | ICD-10-CM

## 2022-08-14 DIAGNOSIS — K863 Pseudocyst of pancreas: Secondary | ICD-10-CM | POA: Diagnosis not present

## 2022-08-14 DIAGNOSIS — K3189 Other diseases of stomach and duodenum: Secondary | ICD-10-CM

## 2022-08-14 DIAGNOSIS — K852 Alcohol induced acute pancreatitis without necrosis or infection: Secondary | ICD-10-CM

## 2022-08-14 NOTE — Progress Notes (Signed)
Established Patient Office Visit  Subjective    Patient ID: Isaac Meza, male    DOB: 09-06-1970  Age: 52 y.o. MRN: [id number]  CC:  Chief Complaint  Patient presents with   Hospitalization Follow-up    Chronic pancreatitis    HPI Dimarco Holstad presents for hospital follow up.   Discharge Date: 08/05/2022 Diagnosis: Pancreatic pseudocyst and chronic pancreatitis Procedures/tests: CT of the abdomen showed chronic pancreatic pseudocyst with fluid collection adjacent to the pancreatic head and duodenum.  EGD with EUS with aspiration of pseudocyst performed on 4/26 with erosive gastropathy noted. Consultants: General surgery and GI New medications: None Discontinued medications: HCTZ Discharge instructions: Recheck BMP/CBC in 1 week and following up with GI for repeat CT scan Status: better - he saw GI yesterday and had labs rechecked, electrolytes returned to normal and white count normal. Pain is mild, worse with palpation and eating. Has lost a significant amount of weight.    Hypertension: -Medications: Lisinopril 20 mg, HCTZ 12.5 mg discontinued while inpatient  -Checking BP at home (average): not checking currently  -Denies any SOB, CP, vision changes, LE edema or symptoms of hypotension -Family history of father with MI in 19's, uncle in 2's and brother in upper 79's   HLD: -Medications: Nothing -Last lipid panel: Lipid Panel     Component Value Date/Time   CHOL 168 08/14/2022 0958   TRIG 65 08/14/2022 0958   HDL 40 08/14/2022 0958   CHOLHDL 2.8 04/30/2022 1116   LDLCALC 115 (H) 08/14/2022 0958   LDLCALC 93 04/30/2022 1116   LABVLDL 13 08/14/2022 0958    The 10-year ASCVD risk score (Arnett DK, et al., 2019) is: 11.6%   Values used to calculate the score:     Age: 65 years     Sex: Male     Is Non-Hispanic African American: No     Diabetic: No     Tobacco smoker: Yes     Systolic Blood Pressure: 156 mmHg     Is BP treated: Yes     HDL Cholesterol: 40  mg/dL     Total Cholesterol: 168 mg/dL   Asthma, childhood:  -Asthma status: controlled -Current Treatments: Nothing, hasn't been on anything since childhood  -Dyspnea frequency: None -Wheezing frequency: None -Cough frequency: None  -Nocturnal symptom frequency: None  -Limitation of activity: no -Visits to ER or Urgent Care in past year: no  Health Maintenance: -Blood work UTD -Colon cancer screening: Cologuard negative 2/24  Outpatient Encounter Medications as of 08/15/2022  Medication Sig   acetaminophen (TYLENOL) 500 MG tablet Take 2 tablets (1,000 mg total) by mouth every 6 (six) hours as needed for mild pain.   ibuprofen (ADVIL) 600 MG tablet Take 1 tablet (600 mg total) by mouth every 8 (eight) hours as needed for moderate pain.   lisinopril (ZESTRIL) 20 MG tablet Take 1 tablet (20 mg total) by mouth daily.   oxyCODONE (OXY IR/ROXICODONE) 5 MG immediate release tablet Take 1 tablet (5 mg total) by mouth every 4 (four) hours as needed for severe pain.   pantoprazole (PROTONIX) 40 MG tablet Take 1 tablet (40 mg total) by mouth 2 (two) times daily.   No facility-administered encounter medications on file as of 08/15/2022.    Past Medical History:  Diagnosis Date   Asthma    childhood   Hypertension     Past Surgical History:  Procedure Laterality Date   BACK SURGERY     ESOPHAGOGASTRODUODENOSCOPY (EGD) WITH PROPOFOL N/A 08/01/2022  Procedure: ESOPHAGOGASTRODUODENOSCOPY (EGD) WITH PROPOFOL;  Surgeon: Jeani Hawking, MD;  Location: [facility] ENDOSCOPY;  Service: Gastroenterology;  Laterality: N/A;   EUS N/A 08/01/2022   Procedure: UPPER ENDOSCOPIC ULTRASOUND (EUS) LINEAR;  Surgeon: Jeani Hawking, MD;  Location: [facility] ENDOSCOPY;  Service: Gastroenterology;  Laterality: N/A;   FINE NEEDLE ASPIRATION  08/01/2022   Procedure: FINE NEEDLE ASPIRATION (FNA) LINEAR;  Surgeon: Jeani Hawking, MD;  Location: [facility] ENDOSCOPY;  Service: Gastroenterology;;   FRACTURE SURGERY Right 2007   arm   XI  ROBOTIC LAPAROSCOPIC ASSISTED APPENDECTOMY N/A 05/16/2022   Procedure: XI ROBOTIC LAPAROSCOPIC ASSISTED APPENDECTOMY;  Surgeon: Henrene Dodge, MD;  Location: [facility] ORS;  Service: General;  Laterality: N/A;    No family history on file.  Social History   Socioeconomic History   Marital status: Single    Spouse name: Not on file   Number of children: Not on file   Years of education: Not on file   Highest education level: Not on file  Occupational History   Not on file  Tobacco Use   Smoking status: Every Day    Packs/day: .5    Types: Cigarettes   Smokeless tobacco: Never  Vaping Use   Vaping Use: Never used  Substance and Sexual Activity   Alcohol use: Yes    Alcohol/week: 21.0 standard drinks of alcohol    Types: 21 Shots of liquor per week    Comment: 3 shots of liquor everyday   Drug use: Not Currently   Sexual activity: Yes  Other Topics Concern   Not on file  Social History Narrative   Not on file   Social Determinants of Health   Financial Resource Strain: Not on file  Food Insecurity: No Food Insecurity (07/29/2022)   Hunger Vital Sign    Worried About Running Out of Food in the Last Year: Never true    Ran Out of Food in the Last Year: Never true  Transportation Needs: No Transportation Needs (07/29/2022)   PRAPARE - Administrator, Civil Service (Medical): No    Lack of Transportation (Non-Medical): No  Physical Activity: Not on file  Stress: Not on file  Social Connections: Not on file  Intimate Partner Violence: Not At Risk (07/29/2022)   Humiliation, Afraid, Rape, and Kick questionnaire    Fear of Current or Ex-Partner: No    Emotionally Abused: No    Physically Abused: No    Sexually Abused: No    Review of Systems  Constitutional:  Negative for chills and fever.  Eyes:  Negative for blurred vision.  Respiratory:  Negative for cough, shortness of breath and wheezing.   Cardiovascular:  Negative for chest pain.  Gastrointestinal:   Positive for abdominal pain. Negative for nausea and vomiting.  Neurological:  Negative for headaches.        Objective    BP 128/70   Pulse 97   Temp 97.8 F (36.6 C) (Oral)   Resp 16   Ht 5\' 8"  (1.727 m)   Wt 117 lb (53.1 kg)   SpO2 97%   BMI 17.79 kg/m   Physical Exam Constitutional:      Appearance: Normal appearance.  HENT:     Head: Normocephalic and atraumatic.  Eyes:     Conjunctiva/sclera: Conjunctivae normal.  Cardiovascular:     Rate and Rhythm: Normal rate and regular rhythm.  Pulmonary:     Effort: Pulmonary effort is normal.     Breath sounds: Normal breath sounds.  Abdominal:  General: There is no distension.     Palpations: Abdomen is soft.     Tenderness: There is abdominal tenderness. There is no guarding or rebound.     Comments: Mild epigastric tenderness to palpitation  Skin:    General: Skin is warm and dry.  Neurological:     General: No focal deficit present.     Mental Status: He is alert. Mental status is at baseline.  Psychiatric:        Mood and Affect: Mood normal.        Behavior: Behavior normal.         Assessment & Plan:   1. Hospital discharge follow-up/Alcohol-induced chronic pancreatitis [facility]): Hospital discharge summary reviewed, along with labs and imaging. Patient improving, discussed how his alcohol use as played a large role in his now chronic pancreatitis. Patient states he completely stopped drinking since being out of the hospital and had no withdrawal symptoms or cravings. Patient working on increasing food intake slowly, did lose about 20 pounds. Continue Pantoprazole, will refill. LDL slightly elevated, ASCD risk elevated, will discuss starting a statin medication at follow up. Had labs with GI yesterday, potassium improved. Following with Dr. Tobi Bastos in June. Will follow up here in 3 months for weight check.  - pantoprazole (PROTONIX) 40 MG tablet; Take 1 tablet (40 mg total) by mouth daily.  Dispense: 90 tablet;  Refill: 0  2. Hypertension, unspecified type: Controlled, HCTZ discontinued now just on Lisinopril 20 mg, refilled today.   - lisinopril (ZESTRIL) 20 MG tablet; Take 1 tablet (20 mg total) by mouth daily.  Dispense: 90 tablet; Refill: 0   Return in about 3 months (around 11/15/2022).   Margarita Mail, DO

## 2022-08-14 NOTE — Patient Instructions (Addendum)
Please go to [facility] [address] to have your labs drawn today.     Pancreatitis Eating Plan Pancreatitis is when your pancreas gets irritated and swollen (inflamed). The pancreas is a small gland behind your stomach. It helps your body digest food and regulate your blood sugar. Pancreatitis can affect how your body digests food, especially foods with fat. You may also have other symptoms such as pain in your abdomen (abdominal pain) or nausea. When you have pancreatitis, following a low-fat eating plan may help you manage symptoms and recover faster. Work with your health care provider or a dietitian to create an eating plan that is right for you. What are tips for following this plan? Reading food labels Use the information on food labels to help keep track of how much fat you eat: Check the serving size. Look for the amount of total fat in grams (g) in one serving. Low-fat foods have 3 g of fat or less per serving. Fat-free foods have 0.5 g of fat or less per serving. Keep track of how much fat you eat based on how many servings you eat. For example, if you eat two servings, the amount of fat you eat will be twice what is listed on the label. Shopping  Buy low-fat or nonfat foods, such as: Fresh, frozen, or canned fruits and vegetables. Grains, including pasta, bread, and rice. Lean meat, poultry, fish, and other protein foods. Low-fat or nonfat dairy. Avoid buying bakery products and other sweets made with whole milk, butter, and eggs. Avoid buying snack foods with added fat, such as anything with butter or cheese flavoring. Cooking Remove skin from poultry, and remove extra fat from meat. Limit the amount of fat and oil you use to 6 tsp (30 mL) or less per day. Cook using low-fat methods, such as boiling, broiling, grilling, steaming, or baking. Use spray oil to cook. Add fat-free chicken broth to add flavor and moisture. Avoid adding cream to thicken soups or  sauces. Use other thickeners such as corn starch or tomato paste. Meal planning  Eat a low-fat diet as told by your dietitian. For most people, this means having no more than 55-65 g of fat each day. Eat small, frequent meals throughout the day. For example, you may have 5 or 6 small meals instead of 3 large meals. Drink enough fluid to keep your urine pale yellow. Do not drink alcohol. Talk to your health care provider if you need help stopping. Limit how much caffeine you have, including black coffee, black and green tea, soft drinks with caffeine, and energy drinks. Plan to include a variety of foods in your diet. Include fruits, vegetables, whole grains and lean proteins, and low-fat or nonfat dairy. You need a balanced diet for good overall health. General information Let your health care provider or dietitian know if you have unplanned weight loss on this eating plan. You may be told to follow a clear liquid or soft food diet when symptoms come back, which is called a flare. Talk with your health care provider about how to manage your diet during symptoms of a flare. Take any vitamins or supplements as told by your health care provider. You may need to take: Extra vitamins that dissolve in fat (are fat soluble), such as vitamins A, D, E, and K. Nutritional supplements. Work with a Data processing manager, especially if you have other conditions such as obesity, osteoporosis, or diabetes mellitus. Some people need extra treatments, such as: Pills or  capsules to replace enzymes (oral pancreatic enzyme replacement therapy). Feedings through a tube in the stomach or intestine (enteral feedings). What foods should I avoid? Fruits Fried fruits. Fruits served with butter or cream. Vegetables Fried vegetables. Vegetables cooked with butter, cheese, or cream. Grains Biscuits, waffles, donuts, pastries, and croissants. Pies and cookies. Butter-flavored popcorn. Regular crackers. Meats and other  proteins Fatty cuts of meat. Poultry with skin. Organ meats. Precooked or cured meat, such as sausages or meat loaves. Whole eggs. Nuts and nut butters. Dairy Whole and 2% milk. Whole milk yogurt. Whole milk ice cream. Cream and half-and-half. Cheese, such as cream cheese. Sour cream. Beverages Wine, beer, and liquor. The items listed above may not be a complete list of foods and beverages you should avoid. Contact a dietitian for more information. Summary Pancreatitis can affect how your body digests food, especially foods with fat. When you have pancreatitis, it is recommended that you follow a low-fat eating plan to help you recover faster and manage symptoms. Do not drink alcohol. Limit the amount of caffeine you have, and drink enough fluid to keep your urine pale yellow. This information is not intended to replace advice given to you by your health care provider. Make sure you discuss any questions you have with your health care provider. Document Revised: 03/13/2021 Document Reviewed: 03/13/2021 Elsevier Patient Education  2023 Elsevier Inc. Acute Pancreatitis  Acute pancreatitis happens when there is sudden swelling and irritation of the pancreas. The pancreas is a gland in your body that helps to control blood sugar. This gland also helps to digest food. This condition can last a few days and cause serious problems. Some problems can be life-threatening. The lungs, heart, and kidneys may stop working. What are the causes? Causes may include: Heavy alcohol use. Drug use. Gallstones. An abnormal growth of tissue (tumor) in the pancreas. Other causes include: Some medicines or some chemicals. Diabetes or infection. High levels of a type of fat in your blood. High levels of calcium in your blood. Damage caused by: An accident. The poison (venom) from a scorpion sting. Belly (abdominal) surgery. The body's defense system (immune system) attacking the pancreas (autoimmune  pancreatitis). Genes that are passed from parent to child (inherited). Sometimes, the cause is not known. What are the signs or symptoms? Pain in the upper belly that may be felt in the back. The pain may be very bad. It often gets worse after you eat. A tender and swollen belly. Feeling like you may vomit (nausea) and vomiting. Fever. How is this treated? A stay in the hospital, in many cases. Pain medicine. Fluid through an IV tube. Placing a tube in the stomach to take out the stomach contents. This also helps you stop vomiting. Not eating until you vomit less. Antibiotic medicines, if you have an infection. Steroid medicines, if your problem is caused by attacks on your body's own tissues by your defense system. Surgery, if your problem is caused by gallstones or other blockage. Treating other health problems that may be the cause. Follow these instructions at home: Medicines Take over-the-counter and prescription medicines only as told by your doctor. If you were prescribed an antibiotic medicine, take it as told by your doctor. Do not stop taking it even if you start to feel better. If told, take steps to prevent problems with pooping (constipation). You may need to: Take medicines. You will be told what medicines to take. Eat foods that are high in fiber. These include beans, whole  grains, and fresh fruits and vegetables. Limit foods that are high in fat and sugar. These include fried or sweet foods. Ask your doctor if you should avoid driving or using machines while you are taking your medicine. Eating and drinking  Follow instructions from your doctor about what to eat and drink. You may need to: Avoid alcohol. Eat foods that do not have a lot of fat in them. Eat small meals often. Do not eat big meals. Drink enough fluid to keep your pee (urine) pale yellow. Do not drink alcohol if it caused your condition. General instructions Do not smoke or use any products that  contain nicotine or tobacco. If you need help quitting, ask your doctor. Get plenty of rest. Check your blood sugar at home if your doctor tells you to. Keep all follow-up visits. Contact a doctor if: You do not get better as fast as expected. Your symptoms get worse. You have new symptoms. You have pain or weakness that lasts a long time. You keep feeling like you may vomit. You get better and then pain comes back. You have a fever. Get help right away if: You vomit every time you eat or drink. Your pain gets very bad. Your skin or the white parts of your eyes turn yellow. You have sudden swelling in your belly. You feel dizzy or you faint. Your blood sugar is high (over 300 mg/dL). You vomit blood. These symptoms may be an emergency. Do not wait to see if the symptoms will go away. Get help right away. Call 911. Summary Acute pancreatitis happens when there is sudden swelling and irritation of the pancreas. This condition is often caused by heavy alcohol use, drug use, or gallstones. You will likely have to stay in the hospital for treatment. This information is not intended to replace advice given to you by your health care provider. Make sure you discuss any questions you have with your health care provider. Document Revised: 02/12/2021 Document Reviewed: 02/12/2021 Elsevier Patient Education  2023 [employer].

## 2022-08-15 ENCOUNTER — Encounter: Payer: Self-pay | Admitting: Internal Medicine

## 2022-08-15 ENCOUNTER — Ambulatory Visit: Payer: Medicaid Other | Admitting: Internal Medicine

## 2022-08-15 VITALS — BP 128/70 | HR 97 | Temp 97.8°F | Resp 16 | Ht 68.0 in | Wt 117.0 lb

## 2022-08-15 DIAGNOSIS — Z09 Encounter for follow-up examination after completed treatment for conditions other than malignant neoplasm: Secondary | ICD-10-CM | POA: Diagnosis not present

## 2022-08-15 DIAGNOSIS — K860 Alcohol-induced chronic pancreatitis: Secondary | ICD-10-CM | POA: Diagnosis not present

## 2022-08-15 DIAGNOSIS — I10 Essential (primary) hypertension: Secondary | ICD-10-CM

## 2022-08-15 LAB — VITAMIN B12: Vitamin B-12: 333 pg/mL (ref 232–1245)

## 2022-08-15 LAB — CBC WITH DIFFERENTIAL
Basophils Absolute: 0 10*3/uL (ref 0.0–0.2)
Basos: 0 %
EOS (ABSOLUTE): 0.1 10*3/uL (ref 0.0–0.4)
Eos: 1 %
Hematocrit: 42.5 % (ref 37.5–51.0)
Hemoglobin: 14.4 g/dL (ref 13.0–17.7)
Immature Grans (Abs): 0 10*3/uL (ref 0.0–0.1)
Immature Granulocytes: 0 %
Lymphocytes Absolute: 2.3 10*3/uL (ref 0.7–3.1)
Lymphs: 27 %
MCH: 31.4 pg (ref 26.6–33.0)
MCHC: 33.9 g/dL (ref 31.5–35.7)
MCV: 93 fL (ref 79–97)
Monocytes Absolute: 0.6 10*3/uL (ref 0.1–0.9)
Monocytes: 7 %
Neutrophils Absolute: 5.5 10*3/uL (ref 1.4–7.0)
Neutrophils: 65 %
RBC: 4.58 x10E6/uL (ref 4.14–5.80)
RDW: 11.9 % (ref 11.6–15.4)
WBC: 8.4 10*3/uL (ref 3.4–10.8)

## 2022-08-15 LAB — BASIC METABOLIC PANEL
BUN/Creatinine Ratio: 11 (ref 9–20)
BUN: 8 mg/dL (ref 6–24)
CO2: 23 mmol/L (ref 20–29)
Calcium: 9.2 mg/dL (ref 8.7–10.2)
Chloride: 98 mmol/L (ref 96–106)
Creatinine, Ser: 0.73 mg/dL — ABNORMAL LOW (ref 0.76–1.27)
Glucose: 119 mg/dL — ABNORMAL HIGH (ref 70–99)
Potassium: 5.2 mmol/L (ref 3.5–5.2)
Sodium: 136 mmol/L (ref 134–144)
eGFR: 112 mL/min/{1.73_m2} (ref >59)

## 2022-08-15 LAB — IRON,TIBC AND FERRITIN PANEL
Ferritin: 510 ng/mL — ABNORMAL HIGH (ref 30–400)
Iron Saturation: 19 % (ref 15–55)
Iron: 51 ug/dL (ref 38–169)
Total Iron Binding Capacity: 271 ug/dL (ref 250–450)
UIBC: 220 ug/dL (ref 111–343)

## 2022-08-15 LAB — LIPASE: Lipase: 100 U/L — ABNORMAL HIGH (ref 13–78)

## 2022-08-15 LAB — LIPID PANEL WITH LDL/HDL RATIO
Cholesterol, Total: 168 mg/dL (ref 100–199)
HDL: 40 mg/dL (ref >39)
LDL Chol Calc (NIH): 115 mg/dL — ABNORMAL HIGH (ref 0–99)
LDL/HDL Ratio: 2.9 ratio (ref 0.0–3.6)
Triglycerides: 65 mg/dL (ref 0–149)
VLDL Cholesterol Cal: 13 mg/dL (ref 5–40)

## 2022-08-15 MED ORDER — LISINOPRIL 20 MG PO TABS: 20.0000 mg | ORAL_TABLET | Freq: Every day | ORAL | 0 refills | Status: DC

## 2022-08-15 MED ORDER — PANTOPRAZOLE SODIUM 40 MG PO TBEC: 40.0000 mg | DELAYED_RELEASE_TABLET | Freq: Every day | ORAL | 0 refills | Status: DC

## 2022-08-18 ENCOUNTER — Telehealth: Payer: Self-pay

## 2022-08-18 ENCOUNTER — Telehealth: Payer: Self-pay | Admitting: Gastroenterology

## 2022-08-18 NOTE — Telephone Encounter (Signed)
-----   Message from Celso Amy, [place] sent at 08/15/2022 12:18 PM EDT ----- All labs have improved compared to 2 weeks ago.  Lipase pancreas test has improved from 124 down to 100.  Normal is less than 78.  Ferritin is elevated, consistent with inflammation.  However iron is normal.  No evidence of iron overload or iron deficiency.  Kidney tests, sodium, and potassium are normal.  Triglycerides are normal.  No evidence of hypertriglyceridemia as a cause for pancreatitis.  CBC is normal.  Hemoglobin has improved to 14, normal, no anemia.  Vitamin B12 is normal.  Continue with current plan to avoid all alcohol and keep follow-up appointment with Dr. Tobi Bastos as scheduled.

## 2022-08-18 NOTE — Telephone Encounter (Signed)
Isaac Meza, This patient was initially seen by Dr. Orvan Falconer who is no longer with our practice and then an EUS was performed as I was not around by Dr. Elnoria Howard. Then the patient was seen by Dr. Barron Alvine during his inpatient stay. Patient should have follow-up with our group. The plan in the hospital was for patient to have repeat imaging performed in the outpatient setting. I recommend that outpatient imaging be pursued as Dr. Barron Alvine had noted for a CT abdomen pancreas protocol. That can be placed under Dr. Frankey Shown name. You can let the patient know that the pancreas sampling is suggestive of a potential inflammatory cyst though we need to repeat imaging to ensure that this is improving or a repeat sampling may be required. I can be available to Dr. Barron Alvine moving forward if needed.   Thanks. GM

## 2022-08-18 NOTE — Telephone Encounter (Signed)
Patient notified of the below.    All labs have improved compared to 2 weeks ago.  Lipase pancreas test has improved from 124 down to 100.  Normal is less than 78.  Ferritin is elevated, consistent with inflammation.  However iron is normal.  No evidence of iron overload or iron deficiency.  Kidney tests, sodium, and potassium are normal.  Triglycerides are normal.  No evidence of hypertriglyceridemia as a cause for pancreatitis.  CBC is normal.  Hemoglobin has improved to 14, normal, no anemia.  Vitamin B12 is normal.  Continue with current plan to avoid all alcohol and keep follow-up appointment with Dr. Tobi Bastos as scheduled.

## 2022-08-18 NOTE — Telephone Encounter (Signed)
Hi Dr. Meridee Score,  We received EUS report from Dr. Elnoria Howard assigned to you, they were scanned into Media for you to review.   Thanks

## 2022-08-19 NOTE — Telephone Encounter (Signed)
Thank you she has follow up with me on 09/15/2022   Regards  Yuri Flener

## 2022-08-26 ENCOUNTER — Other Ambulatory Visit: Payer: Self-pay | Admitting: Internal Medicine

## 2022-08-26 DIAGNOSIS — I10 Essential (primary) hypertension: Secondary | ICD-10-CM

## 2022-08-26 NOTE — Telephone Encounter (Signed)
Requested Prescriptions  Pending Prescriptions Disp Refills   hydrochlorothiazide (MICROZIDE) 12.5 MG capsule [Pharmacy Med Name: HYDROCHLOROTHIAZIDE 12.5MG  CAPSULES] 90 capsule 0    Sig: TAKE 1 CAPSULE(12.5 MG) BY MOUTH DAILY     Cardiovascular: Diuretics - Thiazide Failed - 08/26/2022  3:37 AM      Failed - Cr in normal range and within 180 days    Creat  Date Value Ref Range Status  05/29/2022 0.75 0.60 - 1.29 mg/dL Final   Creatinine, Ser  Date Value Ref Range Status  08/14/2022 0.73 (L) 0.76 - 1.27 mg/dL Final         Passed - K in normal range and within 180 days    Potassium  Date Value Ref Range Status  08/14/2022 5.2 3.5 - 5.2 mmol/L Final         Passed - Na in normal range and within 180 days    Sodium  Date Value Ref Range Status  08/14/2022 136 134 - 144 mmol/L Final         Passed - Last BP in normal range    BP Readings from Last 1 Encounters:  08/15/22 128/70         Passed - Valid encounter within last 6 months    Recent Outpatient Visits           1 week ago Hospital discharge follow-up   [facility] Margarita Mail, DO   1 month ago Hypertension, unspecified type   [facility] Margarita Mail, DO   2 months ago Hypertension, unspecified type   [facility] Margarita Mail, DO   3 months ago Hypertension, unspecified type   [facility] Margarita Mail, DO   4 years ago Traumatic pneumothorax, initial encounter   [facility] Care at [facility], [place], [place]       Future Appointments             In 2 weeks Wyline Mood, MD [facility] [place]   In 2 months Margarita Mail, DO [facility], [facility]

## 2022-09-11 DIAGNOSIS — H5213 Myopia, bilateral: Secondary | ICD-10-CM | POA: Diagnosis not present

## 2022-09-15 ENCOUNTER — Ambulatory Visit: Payer: Medicaid Other | Admitting: Gastroenterology

## 2022-09-15 ENCOUNTER — Other Ambulatory Visit: Payer: Self-pay

## 2022-09-15 DIAGNOSIS — K863 Pseudocyst of pancreas: Secondary | ICD-10-CM

## 2022-09-15 DIAGNOSIS — K852 Alcohol induced acute pancreatitis without necrosis or infection: Secondary | ICD-10-CM

## 2022-09-16 ENCOUNTER — Other Ambulatory Visit: Payer: Self-pay

## 2022-09-16 DIAGNOSIS — K863 Pseudocyst of pancreas: Secondary | ICD-10-CM

## 2022-09-16 DIAGNOSIS — K852 Alcohol induced acute pancreatitis without necrosis or infection: Secondary | ICD-10-CM

## 2022-09-16 NOTE — Progress Notes (Signed)
Per Inetta Fermo change order to CT abdominal and pelvis with contrast. Changed order

## 2022-09-22 ENCOUNTER — Ambulatory Visit: Admission: RE | Admit: 2022-09-22 | Payer: Medicaid Other | Source: Ambulatory Visit

## 2022-09-29 ENCOUNTER — Ambulatory Visit
Admission: RE | Admit: 2022-09-29 | Discharge: 2022-09-29 | Disposition: A | Discharge status: Home / Self Care | Payer: Medicaid Other | Source: Ambulatory Visit | Attending: Physician Assistant | Admitting: Physician Assistant

## 2022-09-29 DIAGNOSIS — K852 Alcohol induced acute pancreatitis without necrosis or infection: Secondary | ICD-10-CM | POA: Diagnosis not present

## 2022-09-29 DIAGNOSIS — K863 Pseudocyst of pancreas: Secondary | ICD-10-CM | POA: Diagnosis not present

## 2022-09-29 DIAGNOSIS — K859 Acute pancreatitis without necrosis or infection, unspecified: Secondary | ICD-10-CM | POA: Diagnosis not present

## 2022-09-29 MED ORDER — IOHEXOL 300 MG/ML  SOLN
100.0000 mL | Freq: Once | INTRAMUSCULAR | Status: AC | PRN
  Administered 2022-09-29: 100 mL via INTRAVENOUS

## 2022-10-03 NOTE — Progress Notes (Signed)
Notify patient CT shows the pancreas cyst has decreased in size yet not resolved.  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  There is still inflammation in the pancreas (pancreatitis).  Please avoid all alcohol.  Schedule lab visit to repeat lipase lab in the next week.  Keep follow-up OV with Dr. Tobi Bastos 7/15 as scheduled.

## 2022-10-06 ENCOUNTER — Telehealth: Payer: Self-pay

## 2022-10-06 ENCOUNTER — Other Ambulatory Visit: Payer: Self-pay

## 2022-10-06 ENCOUNTER — Ambulatory Visit: Payer: Medicaid Other | Admitting: Internal Medicine

## 2022-10-06 DIAGNOSIS — K852 Alcohol induced acute pancreatitis without necrosis or infection: Secondary | ICD-10-CM

## 2022-10-06 NOTE — Telephone Encounter (Signed)
Patient notified and coming to office for labs on 7/8/ between 1-4.pm.   Notify patient CT shows the pancreas cyst has decreased in size yet not resolved.  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  There is still inflammation in the pancreas (pancreatitis).  Please avoid all alcohol.  Schedule lab visit to repeat lipase lab in the next week.  Keep follow-up OV with Dr. Tobi Bastos 7/15 as scheduled.

## 2022-10-20 ENCOUNTER — Encounter: Payer: Self-pay | Admitting: Gastroenterology

## 2022-10-20 ENCOUNTER — Ambulatory Visit: Payer: Medicaid Other | Admitting: Gastroenterology

## 2022-10-20 NOTE — Progress Notes (Deleted)
Wyline Mood MD, MRCP(U.K) [address]  [address]  [place], [place] [address]  Main: [phone]  Fax: [phone]   Primary Care Physician: Margarita Mail, DO  Primary Gastroenterologist:  Dr. Wyline Mood   No chief complaint on file.   HPI: Isaac Meza is a 52 y.o. male   Summary of history :  Usually reference seen in May 2024 for alcohol induced pancreatitis.  Seen in the ER in March 2024 for acute pancreatitis confirmed on CT scan treated conservatively and discharged home returned in April 2024 with pancreatitis which showed chronic pancreatic pseudocyst with fluid collection adjacent to the pancreatic head and duodenum had EUS.  And aspiration of the cystic lesion by Dr. Jacques Navy which showed no malignant cells.  He had been drinking 3 shots of vodka every day for many years.  In May 2024 at follow-up visit he had not completely stop drinking alcohol.  Strongly advised to stop all alcohol.  Interval history   08/14/2022-10/20/2022  10/03/2022: CT scan of the abdomen pelvis with contrast showed interval decrease in size of pseudocyst inferior to the pancreatic head and duodenum probable internal septations and surrounding thick-walled appearance consistent with chronic pseudocyst in addition features of possible acute pancreatitis also noted.  4 mm nodule in the right middle lobe of the lung.  08/14/2022 triglycerides normal, creatinine 0.73 LFTs when checked in April 2024 showed an albumin of 2.5 AST of 13.  B12 333 iron studies showed no gross abnormality.  ***   Current Outpatient Medications  Medication Sig Dispense Refill   acetaminophen (TYLENOL) 500 MG tablet Take 2 tablets (1,000 mg total) by mouth every 6 (six) hours as needed for mild pain.     ibuprofen (ADVIL) 600 MG tablet Take 1 tablet (600 mg total) by mouth every 8 (eight) hours as needed for moderate pain. 60 tablet 1   lisinopril (ZESTRIL) 20 MG tablet Take 1 tablet (20 mg total) by mouth daily. 90  tablet 0   oxyCODONE (OXY IR/ROXICODONE) 5 MG immediate release tablet Take 1 tablet (5 mg total) by mouth every 4 (four) hours as needed for severe pain. 30 tablet 0   pantoprazole (PROTONIX) 40 MG tablet Take 1 tablet (40 mg total) by mouth daily. 90 tablet 0   No current facility-administered medications for this visit.    Allergies as of 10/20/2022   (No Known Allergies)       Interval history   ***/***/202*   ***/***/2024   ROS:  General: Negative for anorexia, weight loss, fever, chills, fatigue, weakness. ENT: Negative for hoarseness, difficulty swallowing , nasal congestion. CV: Negative for chest pain, angina, palpitations, dyspnea on exertion, peripheral edema.  Respiratory: Negative for dyspnea at rest, dyspnea on exertion, cough, sputum, wheezing.  GI: See history of present illness. GU:  Negative for dysuria, hematuria, urinary incontinence, urinary frequency, nocturnal urination.  Endo: Negative for unusual weight change.    Physical Examination:   There were no vitals taken for this visit.  General: Well-nourished, well-developed in no acute distress.  Eyes: No icterus. Conjunctivae pink. Mouth: Oropharyngeal mucosa moist and pink , no lesions erythema or exudate. Lungs: Clear to auscultation bilaterally. Non-labored. Heart: Regular rate and rhythm, no murmurs rubs or gallops.  Abdomen: Bowel sounds are normal, nontender, nondistended, no hepatosplenomegaly or masses, no abdominal bruits or hernia , no rebound or guarding.   Extremities: No lower extremity edema. No clubbing or deformities. Neuro: Alert and oriented x 3.  Grossly intact. Skin: Warm  and dry, no jaundice.   Psych: Alert and cooperative, normal mood and affect.   Imaging Studies: CT Abdomen Pelvis W Contrast  Result Date: 10/03/2022 CLINICAL DATA:  Pancreatitis and pancreatic pseudocyst. EXAM: CT ABDOMEN AND PELVIS WITH CONTRAST TECHNIQUE: Multidetector CT imaging of the abdomen and pelvis  was performed using the standard protocol following bolus administration of intravenous contrast. RADIATION DOSE REDUCTION: This exam was performed according to the departmental dose-optimization program which includes automated exposure control, adjustment of the mA and/or kV according to patient size and/or use of iterative reconstruction technique. CONTRAST:  OMNIPAQUE IOHEXOL 300 MG/ML  SOLN COMPARISON:  07/27/2022 FINDINGS: Lower chest: Stable fissural nodule of the right middle lobe measures 4 mm. Hepatobiliary: No focal liver abnormality is seen. No gallstones, gallbladder wall thickening, or biliary dilatation. Pancreas: The presumed pseudocyst inferior to the pancreatic head and duodenum seen on the prior study is smaller measuring approximately 3.3 x 2.4 x 4.0 cm compared to approximately 3.9 x 4.0 x 5.7 cm previously. This continues to demonstrate probable internal septations and surrounding thick walled appearance consistent with a chronic pseudocyst. There are some new inflammatory changes inferior and posterior to the pseudocyst in the retroperitoneum that track inferiorly along the posterior pericolic region which may reflect a component of acute pancreatitis. Correlation suggested with lipase level. No evidence of pancreatic necrosis, ductal dilatation or calcification. Spleen: Normal in size without focal abnormality. Adrenals/Urinary Tract: Adrenal glands are unremarkable. Kidneys are normal, without renal calculi, focal lesion, or hydronephrosis. Bladder is moderately distended. Stomach/Bowel: Bowel shows no evidence of obstruction, ileus, inflammation or lesion. The appendix is surgically absent. No free intraperitoneal air. Vascular/Lymphatic: Atherosclerosis of the abdominal aorta without aneurysm. Portal vein, mesenteric veins and splenic vein are normally patent. No enlarged abdominal or pelvic lymph nodes. Reproductive: Prostate is unremarkable. Other: No abdominal wall hernia or  abnormality. No abdominopelvic ascites. Musculoskeletal: No acute or significant osseous findings. IMPRESSION: 1. Interval decrease in size of presumed pseudocyst inferior to the pancreatic head and duodenum. This continues to demonstrate probable internal septations and surrounding thick walled appearance consistent with a chronic pseudocyst. 2. New inflammatory changes inferior and posterior to the pseudocyst in the retroperitoneum that track inferiorly along the posterior pericolic region which may reflect a component of acute pancreatitis. Correlation suggested with lipase level. 3. Stable 4 mm fissural nodule of the right middle lobe. No follow-up needed if patient is low-risk.This recommendation follows the consensus statement: Guidelines for Management of Incidental Pulmonary Nodules Detected on CT Images: From the Fleischner Society 2017; Radiology 2017; 284:228-243. 4. Aortic atherosclerosis. Aortic Atherosclerosis (ICD10-I70.0). Electronically Signed   By: Irish Lack M.D.   On: 10/03/2022 15:32    Assessment and Plan:   Isaac Meza is a 52 y.o. 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  Lung nodule follow-up with Margarita Mail, DO  2.  Chronic alcohol use has to stop Isaac Meza 3.  Pancreatitis may have an element of recurrent pancreatitis from alcohol use.  This is not going to get better unless he stops drinking alcohol.  There is a pancreatic pseudocyst present treatment is to watch it usually does not require intervention unless it is causing symptoms are infected or increasing in size. 4.  After his acute episode and recovered from pancreatitis we could consider a screening colonoscopy at a later point 5.  Malnutrition probably due to chronic alcohol use need to stop drinking alcohol and improve  diet 6.  Repeat CT scan of the abdomen to check for resolution of the pseudocyst in 3 months  Dr  Wyline Mood  MD,MRCP [facility]) Follow up in ***  BP check ***

## 2022-11-13 NOTE — Progress Notes (Signed)
Established Patient Office Visit  Subjective    Patient ID: Isaac Meza, male    DOB: February 26, 1971  Age: 52 y.o. MRN: [id number]  CC:  Chief Complaint  Patient presents with   Follow-up    HPI Isaac Meza presents for follow up on chronic medical conditions. Patient states he is having some right hand pain, mostly over the third MCP. Has been going on since June, denies known trauma but pain came on suddenly. Is inflamed and swollen.   Pancreatic pseudocyst and chronic pancreatitis: -CT of the abdomen showed chronic pancreatic pseudocyst with fluid collection adjacent to the pancreatic head and duodenum.   -EGD with EUS with aspiration of pseudocyst performed on 4/26 with erosive gastropathy noted.  -Currently on Protonix 40 mg -Following with GI, last seen 09/29/22 but no-showed an appointment in July, patient states he will call to make another follow up -Decreased alcohol intake but still drinking one mixed drink daily   Hypertension: -Medications: Lisinopril 20 mg -Previous Meds: HCTZ 12.5 mg discontinued while inpatient  -Checking BP at home (average): not checking currently  -Denies any SOB, CP, vision changes, LE edema or symptoms of hypotension -Family history of father with MI in 38's, uncle in 65's and brother in upper 77's   HLD: -Medications: Nothing -Last lipid panel: Lipid Panel     Component Value Date/Time   CHOL 168 08/14/2022 0958   TRIG 65 08/14/2022 0958   HDL 40 08/14/2022 0958   CHOLHDL 2.8 04/30/2022 1116   LDLCALC 115 (H) 08/14/2022 0958   LDLCALC 93 04/30/2022 1116   LABVLDL 13 08/14/2022 0958    The 10-year ASCVD risk score (Arnett DK, et al., 2019) is: 10%   Values used to calculate the score:     Age: 68 years     Sex: Male     Is Non-Hispanic African American: No     Diabetic: No     Tobacco smoker: Yes     Systolic Blood Pressure: 138 mmHg     Is BP treated: Yes     HDL Cholesterol: 40 mg/dL     Total Cholesterol: 168  mg/dL  Asthma, childhood:  -Asthma status: controlled -Current Treatments: Nothing, hasn't been on anything since childhood  -Dyspnea frequency: None -Wheezing frequency: None -Cough frequency: None  -Nocturnal symptom frequency: None  -Limitation of activity: no -Visits to ER or Urgent Care in past year: no  Health Maintenance: -Blood work UTD -Colon cancer screening: Cologuard negative 2/24  Outpatient Encounter Medications as of 11/18/2022  Medication Sig   acetaminophen (TYLENOL) 500 MG tablet Take 2 tablets (1,000 mg total) by mouth every 6 (six) hours as needed for mild pain.   ibuprofen (ADVIL) 600 MG tablet Take 1 tablet (600 mg total) by mouth every 8 (eight) hours as needed for moderate pain.   lisinopril (ZESTRIL) 20 MG tablet Take 1 tablet (20 mg total) by mouth daily.   oxyCODONE (OXY IR/ROXICODONE) 5 MG immediate release tablet Take 1 tablet (5 mg total) by mouth every 4 (four) hours as needed for severe pain.   pantoprazole (PROTONIX) 40 MG tablet Take 1 tablet (40 mg total) by mouth daily.   No facility-administered encounter medications on file as of 11/18/2022.    Past Medical History:  Diagnosis Date   Asthma    childhood   Hypertension     Past Surgical History:  Procedure Laterality Date   BACK SURGERY     ESOPHAGOGASTRODUODENOSCOPY (EGD) WITH PROPOFOL N/A 08/01/2022  Procedure: ESOPHAGOGASTRODUODENOSCOPY (EGD) WITH PROPOFOL;  Surgeon: Jeani Hawking, MD;  Location: [facility] ENDOSCOPY;  Service: Gastroenterology;  Laterality: N/A;   EUS N/A 08/01/2022   Procedure: UPPER ENDOSCOPIC ULTRASOUND (EUS) LINEAR;  Surgeon: Jeani Hawking, MD;  Location: [facility] ENDOSCOPY;  Service: Gastroenterology;  Laterality: N/A;   FINE NEEDLE ASPIRATION  08/01/2022   Procedure: FINE NEEDLE ASPIRATION (FNA) LINEAR;  Surgeon: Jeani Hawking, MD;  Location: [facility] ENDOSCOPY;  Service: Gastroenterology;;   FRACTURE SURGERY Right 2007   arm   XI ROBOTIC LAPAROSCOPIC ASSISTED APPENDECTOMY N/A  05/16/2022   Procedure: XI ROBOTIC LAPAROSCOPIC ASSISTED APPENDECTOMY;  Surgeon: Henrene Dodge, MD;  Location: [facility] ORS;  Service: General;  Laterality: N/A;    History reviewed. No pertinent family history.  Social History   Socioeconomic History   Marital status: Single    Spouse name: Not on file   Number of children: Not on file   Years of education: Not on file   Highest education level: Not on file  Occupational History   Not on file  Tobacco Use   Smoking status: Every Day    Current packs/day: 0.50    Types: Cigarettes   Smokeless tobacco: Never  Vaping Use   Vaping status: Never Used  Substance and Sexual Activity   Alcohol use: Yes    Alcohol/week: 21.0 standard drinks of alcohol    Types: 21 Shots of liquor per week    Comment: 3 shots of liquor everyday   Drug use: Not Currently   Sexual activity: Yes  Other Topics Concern   Not on file  Social History Narrative   Not on file   Social Determinants of Health   Financial Resource Strain: Not on file  Food Insecurity: No Food Insecurity (07/29/2022)   Hunger Vital Sign    Worried About Running Out of Food in the Last Year: Never true    Ran Out of Food in the Last Year: Never true  Transportation Needs: No Transportation Needs (07/29/2022)   PRAPARE - Administrator, Civil Service (Medical): No    Lack of Transportation (Non-Medical): No  Physical Activity: Not on file  Stress: Not on file  Social Connections: Not on file  Intimate Partner Violence: Not At Risk (07/29/2022)   Humiliation, Afraid, Rape, and Kick questionnaire    Fear of Current or Ex-Partner: No    Emotionally Abused: No    Physically Abused: No    Sexually Abused: No    Review of Systems  Constitutional:  Negative for chills and fever.  Eyes:  Negative for blurred vision.  Respiratory:  Negative for cough, shortness of breath and wheezing.   Cardiovascular:  Negative for chest pain.  Gastrointestinal:  Positive for  abdominal pain. Negative for nausea and vomiting.  Neurological:  Negative for headaches.        Objective    BP 138/86   Pulse 93   Temp 98.3 F (36.8 C)   Resp 18   Ht 5\' 7"  (1.702 m)   Wt 119 lb 12.8 oz (54.3 kg)   SpO2 98%   BMI 18.76 kg/m   Physical Exam Constitutional:      Appearance: Normal appearance.  HENT:     Head: Normocephalic and atraumatic.  Eyes:     Conjunctiva/sclera: Conjunctivae normal.  Cardiovascular:     Rate and Rhythm: Normal rate and regular rhythm.  Pulmonary:     Effort: Pulmonary effort is normal.     Breath sounds: Normal breath sounds.  Musculoskeletal:        General: Swelling and tenderness present.     Comments: Swelling and inflammation over right third MCP, good ROM  Skin:    General: Skin is warm and dry.  Neurological:     General: No focal deficit present.     Mental Status: He is alert. Mental status is at baseline.  Psychiatric:        Mood and Affect: Mood normal.        Behavior: Behavior normal.         Assessment & Plan:   1. Hypertension, unspecified type: Blood pressure well controlled despite no longer being on hydrochlorothiazide, continue Lisinopril 20 mg, refilled.  - lisinopril (ZESTRIL) 20 MG tablet; Take 1 tablet (20 mg total) by mouth daily.  Dispense: 90 tablet; Refill: 1  2. Alcohol-induced chronic pancreatitis [facility]): Still actively drinking despite knowing risks of this. Patient not interested in complete sobriety, despite having epigastric pain again last week that improved on its own. Patient will call GI to make follow up appointment, taking Protonix 40 mg will refill today.   - pantoprazole (PROTONIX) 40 MG tablet; Take 1 tablet (40 mg total) by mouth daily.  Dispense: 90 tablet; Refill: 1  3. Right hand pain: Send for x-ray today.   - DG Hand Complete Right; Future   Return in about 6 months (around 05/21/2023).   Margarita Mail, DO

## 2022-11-18 ENCOUNTER — Ambulatory Visit
Admission: RE | Admit: 2022-11-18 | Discharge: 2022-11-18 | Disposition: A | Discharge status: Home / Self Care | Payer: Medicaid Other | Attending: Internal Medicine | Admitting: Internal Medicine

## 2022-11-18 ENCOUNTER — Ambulatory Visit: Admission: RE | Admit: 2022-11-18 | Payer: Medicaid Other | Source: Ambulatory Visit

## 2022-11-18 ENCOUNTER — Ambulatory Visit: Payer: Medicaid Other | Admitting: Internal Medicine

## 2022-11-18 ENCOUNTER — Encounter: Payer: Self-pay | Admitting: Internal Medicine

## 2022-11-18 VITALS — BP 138/86 | HR 93 | Temp 98.3°F | Resp 18 | Ht 67.0 in | Wt 119.8 lb

## 2022-11-18 DIAGNOSIS — S62306A Unspecified fracture of fifth metacarpal bone, right hand, initial encounter for closed fracture: Secondary | ICD-10-CM | POA: Diagnosis not present

## 2022-11-18 DIAGNOSIS — M79641 Pain in right hand: Secondary | ICD-10-CM | POA: Diagnosis not present

## 2022-11-18 DIAGNOSIS — I10 Essential (primary) hypertension: Secondary | ICD-10-CM | POA: Diagnosis not present

## 2022-11-18 DIAGNOSIS — K860 Alcohol-induced chronic pancreatitis: Secondary | ICD-10-CM | POA: Diagnosis not present

## 2022-11-18 DIAGNOSIS — M25541 Pain in joints of right hand: Secondary | ICD-10-CM | POA: Diagnosis not present

## 2022-11-18 MED ORDER — LISINOPRIL 20 MG PO TABS: 20.0000 mg | ORAL_TABLET | Freq: Every day | ORAL | 1 refills | Status: DC

## 2022-11-18 MED ORDER — PANTOPRAZOLE SODIUM 40 MG PO TBEC: 40.0000 mg | DELAYED_RELEASE_TABLET | Freq: Every day | ORAL | 1 refills | Status: DC

## 2022-12-06 ENCOUNTER — Other Ambulatory Visit: Payer: Self-pay

## 2022-12-06 ENCOUNTER — Emergency Department (HOSPITAL_COMMUNITY): Payer: Medicaid Other

## 2022-12-06 ENCOUNTER — Encounter (HOSPITAL_COMMUNITY): Payer: Self-pay

## 2022-12-06 ENCOUNTER — Observation Stay (HOSPITAL_COMMUNITY)
Admission: EM | Admit: 2022-12-06 | Discharge: 2022-12-07 | Disposition: A | Discharge status: Home / Self Care | Payer: Medicaid Other | Source: Home / Self Care | Attending: Emergency Medicine | Admitting: Emergency Medicine

## 2022-12-06 DIAGNOSIS — I160 Hypertensive urgency: Secondary | ICD-10-CM | POA: Diagnosis not present

## 2022-12-06 DIAGNOSIS — K3189 Other diseases of stomach and duodenum: Secondary | ICD-10-CM

## 2022-12-06 DIAGNOSIS — R1013 Epigastric pain: Secondary | ICD-10-CM | POA: Diagnosis not present

## 2022-12-06 DIAGNOSIS — Z79899 Other long term (current) drug therapy: Secondary | ICD-10-CM | POA: Insufficient documentation

## 2022-12-06 DIAGNOSIS — K852 Alcohol induced acute pancreatitis without necrosis or infection: Secondary | ICD-10-CM | POA: Insufficient documentation

## 2022-12-06 DIAGNOSIS — D751 Secondary polycythemia: Secondary | ICD-10-CM

## 2022-12-06 DIAGNOSIS — J45909 Unspecified asthma, uncomplicated: Secondary | ICD-10-CM | POA: Diagnosis not present

## 2022-12-06 DIAGNOSIS — K2960 Other gastritis without bleeding: Secondary | ICD-10-CM | POA: Diagnosis not present

## 2022-12-06 DIAGNOSIS — D750 Familial erythrocytosis: Secondary | ICD-10-CM | POA: Insufficient documentation

## 2022-12-06 DIAGNOSIS — K863 Pseudocyst of pancreas: Secondary | ICD-10-CM | POA: Diagnosis not present

## 2022-12-06 DIAGNOSIS — K859 Acute pancreatitis without necrosis or infection, unspecified: Secondary | ICD-10-CM | POA: Diagnosis not present

## 2022-12-06 DIAGNOSIS — F17210 Nicotine dependence, cigarettes, uncomplicated: Secondary | ICD-10-CM | POA: Insufficient documentation

## 2022-12-06 DIAGNOSIS — F1090 Alcohol use, unspecified, uncomplicated: Secondary | ICD-10-CM

## 2022-12-06 DIAGNOSIS — K5730 Diverticulosis of large intestine without perforation or abscess without bleeding: Secondary | ICD-10-CM | POA: Diagnosis not present

## 2022-12-06 LAB — COMPREHENSIVE METABOLIC PANEL
ALT: 10 U/L (ref 0–44)
AST: 19 U/L (ref 15–41)
Albumin: 3.9 g/dL (ref 3.5–5.0)
Alkaline Phosphatase: 73 U/L (ref 38–126)
Anion gap: 17 — ABNORMAL HIGH (ref 5–15)
BUN: 9 mg/dL (ref 6–20)
CO2: 22 mmol/L (ref 22–32)
Calcium: 8.9 mg/dL (ref 8.9–10.3)
Chloride: 99 mmol/L (ref 98–111)
Creatinine, Ser: 0.83 mg/dL (ref 0.61–1.24)
GFR, Estimated: >60 mL/min (ref >60)
Glucose, Bld: 121 mg/dL — ABNORMAL HIGH (ref 70–99)
Potassium: 3.5 mmol/L (ref 3.5–5.1)
Sodium: 138 mmol/L (ref 135–145)
Total Bilirubin: 0.8 mg/dL (ref 0.3–1.2)
Total Protein: 6.7 g/dL (ref 6.5–8.1)

## 2022-12-06 LAB — CBC
HCT: 55.5 % — ABNORMAL HIGH (ref 39.0–52.0)
Hemoglobin: 18.3 g/dL — ABNORMAL HIGH (ref 13.0–17.0)
MCH: 31.2 pg (ref 26.0–34.0)
MCHC: 33 g/dL (ref 30.0–36.0)
MCV: 94.7 fL (ref 80.0–100.0)
Platelets: 237 10*3/uL (ref 150–400)
RBC: 5.86 MIL/uL — ABNORMAL HIGH (ref 4.22–5.81)
RDW: 12.3 % (ref 11.5–15.5)
WBC: 13.2 10*3/uL — ABNORMAL HIGH (ref 4.0–10.5)
nRBC: 0 % (ref 0.0–0.2)

## 2022-12-06 LAB — LIPASE, BLOOD: Lipase: 93 U/L — ABNORMAL HIGH (ref 11–51)

## 2022-12-06 MED ORDER — THIAMINE HCL 100 MG/ML IJ SOLN: 100.0000 mg | Freq: Every day | INTRAMUSCULAR | Status: DC

## 2022-12-06 MED ORDER — FOLIC ACID 1 MG PO TABS
1.0000 mg | ORAL_TABLET | Freq: Every day | ORAL | Status: DC
  Administered 2022-12-06 – 2022-12-07 (×2): 1 mg via ORAL
  Filled 2022-12-06 (×2): qty 1

## 2022-12-06 MED ORDER — HYDROMORPHONE HCL 1 MG/ML IJ SOLN
1.0000 mg | Freq: Once | INTRAMUSCULAR | Status: AC
  Administered 2022-12-06: 1 mg via INTRAVENOUS
  Filled 2022-12-06: qty 1

## 2022-12-06 MED ORDER — ACETAMINOPHEN 650 MG RE SUPP: 650.0000 mg | Freq: Four times a day (QID) | RECTAL | Status: DC | PRN

## 2022-12-06 MED ORDER — PANTOPRAZOLE SODIUM 40 MG PO TBEC
40.0000 mg | DELAYED_RELEASE_TABLET | Freq: Every day | ORAL | Status: DC
  Administered 2022-12-07: 40 mg via ORAL
  Filled 2022-12-06: qty 1

## 2022-12-06 MED ORDER — LISINOPRIL 20 MG PO TABS
20.0000 mg | ORAL_TABLET | Freq: Every day | ORAL | Status: DC
  Administered 2022-12-07: 20 mg via ORAL
  Filled 2022-12-06: qty 1

## 2022-12-06 MED ORDER — METRONIDAZOLE 500 MG/100ML IV SOLN
500.0000 mg | Freq: Two times a day (BID) | INTRAVENOUS | Status: DC
  Administered 2022-12-06 – 2022-12-07 (×2): 500 mg via INTRAVENOUS
  Filled 2022-12-06 (×2): qty 100

## 2022-12-06 MED ORDER — LORAZEPAM 1 MG PO TABS: 1.0000 mg | ORAL_TABLET | ORAL | Status: DC | PRN

## 2022-12-06 MED ORDER — HYDRALAZINE HCL 20 MG/ML IJ SOLN
10.0000 mg | Freq: Four times a day (QID) | INTRAMUSCULAR | Status: DC | PRN
  Administered 2022-12-06: 10 mg via INTRAVENOUS
  Filled 2022-12-06: qty 1

## 2022-12-06 MED ORDER — IOHEXOL 350 MG/ML SOLN
75.0000 mL | Freq: Once | INTRAVENOUS | Status: AC | PRN
  Administered 2022-12-06: 75 mL via INTRAVENOUS

## 2022-12-06 MED ORDER — SODIUM CHLORIDE 0.9 % IV SOLN
2.0000 g | INTRAVENOUS | Status: DC
  Administered 2022-12-06: 2 g via INTRAVENOUS
  Filled 2022-12-06: qty 20

## 2022-12-06 MED ORDER — ONDANSETRON HCL 4 MG/2ML IJ SOLN: 4.0000 mg | Freq: Four times a day (QID) | INTRAMUSCULAR | Status: DC | PRN

## 2022-12-06 MED ORDER — ACETAMINOPHEN 325 MG PO TABS: 650.0000 mg | ORAL_TABLET | Freq: Four times a day (QID) | ORAL | Status: DC | PRN

## 2022-12-06 MED ORDER — ADULT MULTIVITAMIN W/MINERALS CH
1.0000 | ORAL_TABLET | Freq: Every day | ORAL | Status: DC
  Administered 2022-12-06 – 2022-12-07 (×2): 1 via ORAL
  Filled 2022-12-06 (×2): qty 1

## 2022-12-06 MED ORDER — NALOXONE HCL 0.4 MG/ML IJ SOLN: 0.4000 mg | INTRAMUSCULAR | Status: DC | PRN

## 2022-12-06 MED ORDER — LACTATED RINGERS IV BOLUS
1000.0000 mL | Freq: Once | INTRAVENOUS | Status: AC
  Administered 2022-12-06: 1000 mL via INTRAVENOUS

## 2022-12-06 MED ORDER — HYDRALAZINE HCL 20 MG/ML IJ SOLN
10.0000 mg | Freq: Once | INTRAMUSCULAR | Status: AC
  Administered 2022-12-06: 10 mg via INTRAVENOUS
  Filled 2022-12-06: qty 1

## 2022-12-06 MED ORDER — HYDROMORPHONE HCL 1 MG/ML IJ SOLN
1.0000 mg | INTRAMUSCULAR | Status: DC | PRN
  Administered 2022-12-06 – 2022-12-07 (×3): 1 mg via INTRAVENOUS
  Filled 2022-12-06 (×3): qty 1

## 2022-12-06 MED ORDER — MORPHINE SULFATE (PF) 4 MG/ML IV SOLN
4.0000 mg | Freq: Once | INTRAVENOUS | Status: AC
  Administered 2022-12-06: 4 mg via INTRAVENOUS
  Filled 2022-12-06: qty 1

## 2022-12-06 MED ORDER — ONDANSETRON HCL 4 MG/2ML IJ SOLN
4.0000 mg | Freq: Once | INTRAMUSCULAR | Status: AC
  Administered 2022-12-06: 4 mg via INTRAVENOUS
  Filled 2022-12-06: qty 2

## 2022-12-06 MED ORDER — SODIUM CHLORIDE 0.9 % IV SOLN: INTRAVENOUS | Status: AC

## 2022-12-06 MED ORDER — LORAZEPAM 2 MG/ML IJ SOLN: 1.0000 mg | INTRAMUSCULAR | Status: DC | PRN

## 2022-12-06 MED ORDER — THIAMINE MONONITRATE 100 MG PO TABS
100.0000 mg | ORAL_TABLET | Freq: Every day | ORAL | Status: DC
  Administered 2022-12-06 – 2022-12-07 (×2): 100 mg via ORAL
  Filled 2022-12-06 (×2): qty 1

## 2022-12-06 NOTE — ED Provider Notes (Signed)
[place] EMERGENCY DEPARTMENT AT [facility] Provider Note   CSN: [id number] Arrival date & time: 12/06/22  1436     History Chief Complaint  Patient presents with   Abdominal Pain    Isaac Meza is a 52 y.o. male.  Patient presents to the emergency department concerns of epigastric abdominal pain.  Past history significant for chronic pancreatitis, dehydration, pancreatic cyst.  Presents after worsening right upper quadrant epigastric pain for the last day or so.  Endorses some associated nausea and poor oral intake.  He is still drinking vodka and had three drinks last night. Has not been able to discontinue alcohol use and has been seen in the ED for this pain multiple times.  Denies any changes in bowel habits.   Abdominal Pain      Home Medications Prior to Admission medications   Medication Sig Start Date End Date Taking? Authorizing Provider  acetaminophen (TYLENOL) 500 MG tablet Take 2 tablets (1,000 mg total) by mouth every 6 (six) hours as needed for mild pain. 05/16/22   Henrene Dodge, MD  ibuprofen (ADVIL) 600 MG tablet Take 1 tablet (600 mg total) by mouth every 8 (eight) hours as needed for moderate pain. 05/16/22   Henrene Dodge, MD  lisinopril (ZESTRIL) 20 MG tablet Take 1 tablet (20 mg total) by mouth daily. 11/18/22   Margarita Mail, DO  oxyCODONE (OXY IR/ROXICODONE) 5 MG immediate release tablet Take 1 tablet (5 mg total) by mouth every 4 (four) hours as needed for severe pain. 05/16/22   Henrene Dodge, MD  pantoprazole (PROTONIX) 40 MG tablet Take 1 tablet (40 mg total) by mouth daily. 11/18/22   Margarita Mail, DO      Allergies    Patient has no known allergies.    Review of Systems   Review of Systems  Gastrointestinal:  Positive for abdominal pain.  All other systems reviewed and are negative.   Physical Exam Updated Vital Signs BP (!) 195/118   Pulse 87   Temp (!) 97.3 F (36.3 C) (Oral)   Resp 12   SpO2 96%  Physical  Exam Vitals and nursing note reviewed.  Constitutional:      General: He is not in acute distress.    Appearance: He is well-developed.  HENT:     Head: Normocephalic and atraumatic.  Eyes:     Conjunctiva/sclera: Conjunctivae normal.  Cardiovascular:     Rate and Rhythm: Normal rate and regular rhythm.     Heart sounds: No murmur heard. Pulmonary:     Effort: Pulmonary effort is normal. No respiratory distress.     Breath sounds: Normal breath sounds.  Abdominal:     General: Bowel sounds are normal.     Palpations: Abdomen is soft. There is no hepatomegaly or mass.     Tenderness: There is generalized abdominal tenderness and tenderness in the right upper quadrant.  Musculoskeletal:        General: No swelling.     Cervical back: Neck supple.  Skin:    General: Skin is warm and dry.     Capillary Refill: Capillary refill takes less than 2 seconds.  Neurological:     Mental Status: He is alert.  Psychiatric:        Mood and Affect: Mood normal.     ED Results / Procedures / Treatments   Labs (all labs ordered are listed, but only abnormal results are displayed) Labs Reviewed  LIPASE, BLOOD - Abnormal; Notable for the following  components:      Result Value   Lipase 93 (*)    All other components within normal limits  COMPREHENSIVE METABOLIC PANEL - Abnormal; Notable for the following components:   Glucose, Bld 121 (*)    Anion gap 17 (*)    All other components within normal limits  CBC - Abnormal; Notable for the following components:   WBC 13.2 (*)    RBC 5.86 (*)    Hemoglobin 18.3 (*)    HCT 55.5 (*)    All other components within normal limits    EKG None  Radiology CT ABDOMEN PELVIS W CONTRAST  Result Date: 12/06/2022 CLINICAL DATA:  Acute severe pancreatitis EXAM: CT ABDOMEN AND PELVIS WITH CONTRAST TECHNIQUE: Multidetector CT imaging of the abdomen and pelvis was performed using the standard protocol following bolus administration of intravenous  contrast. RADIATION DOSE REDUCTION: This exam was performed according to the departmental dose-optimization program which includes automated exposure control, adjustment of the mA and/or kV according to patient size and/or use of iterative reconstruction technique. CONTRAST:  75mL OMNIPAQUE IOHEXOL 350 MG/ML SOLN COMPARISON:  09/29/2022 FINDINGS: Lower chest: Hypoventilatory changes at the lung bases. No acute pleural or parenchymal lung disease. Hepatobiliary: No focal liver abnormality is seen. No gallstones, gallbladder wall thickening, or biliary dilatation. Pancreas: There is a complex rim enhancing fluid collection along the ventral margin of the pancreatic head, measuring 3.5 x 3.1 cm in transverse direction and extending 7.5 cm in craniocaudal extent. There is hyperdense material within the inferior margin of this fluid collection, and findings are consistent with pancreatic pseudocyst. There are no acute inflammatory changes involving the pancreas. No pancreatic duct dilation. Spleen: Normal in size without focal abnormality. Adrenals/Urinary Tract: Adrenal glands are unremarkable. Kidneys are normal, without renal calculi, focal lesion, or hydronephrosis. Bladder is unremarkable. Stomach/Bowel: No bowel obstruction or ileus. There is segmental wall thickening and pericolonic fat stranding involving the hepatic flexure of the colon, extending approximately 8 cm in length. This inflamed colonic segment abuts the pancreatic pseudocyst described above, with loss of intervening fat plane. I cannot exclude underlying communication between the pseudocyst and the hepatic flexure of colon, best seen on image 56/6, image 39/7, and image 30/8. Further evaluation with colonoscopy may be useful to exclude underlying fistula. The appendix is surgically absent. Diverticulosis of the sigmoid colon without diverticulitis. Vascular/Lymphatic: Aortic atherosclerosis. No enlarged abdominal or pelvic lymph nodes. Reproductive:  Prostate is unremarkable. Other: There is no free fluid or free intraperitoneal gas. No abdominal wall hernia. Musculoskeletal: No acute or destructive bony abnormalities. Reconstructed images demonstrate no additional findings. IMPRESSION: 1. Interval enlargement of the pancreatic pseudocyst seen on prior exam. No evidence of acute pancreatitis. 2. Since the prior study, there is interval development of segmental colonic wall thickening and pericolonic fat stranding at the hepatic flexure of the colon, with loss of fat plane between the inflamed colon and pseudocyst. Several images suggest fistula formation between the inflamed colon and the pseudocyst, with high attenuation material within the inferior margin of the pseudocyst possibly representing bowel contents. Further evaluation with colonoscopy is recommended. The appearance of the colon could reflect primary colitis versus secondary inflammatory involvement due to the adjacent pseudocyst and suspected fistula. 3. Sigmoid diverticulosis without diverticulitis. 4.  Aortic Atherosclerosis (ICD10-I70.0). Electronically Signed   By: Sharlet Salina M.D.   On: 12/06/2022 18:37    Procedures Procedures   Medications Ordered in ED Medications  HYDROmorphone (DILAUDID) injection 1 mg (has no administration in time  range)  lactated ringers bolus 1,000 mL (0 mLs Intravenous Stopped 12/06/22 1842)  morphine (PF) 4 MG/ML injection 4 mg (4 mg Intravenous Given 12/06/22 1702)  iohexol (OMNIPAQUE) 350 MG/ML injection 75 mL (75 mLs Intravenous Contrast Given 12/06/22 1715)  ondansetron (ZOFRAN) injection 4 mg (4 mg Intravenous Given 12/06/22 1759)  hydrALAZINE (APRESOLINE) injection 10 mg (10 mg Intravenous Given 12/06/22 1911)    ED Course/ Medical Decision Making/ A&P                               Medical Decision Making Amount and/or Complexity of Data Reviewed Labs: ordered. Radiology: ordered.  Risk Prescription drug management.   This patient  presents to the ED for concern of abdominal pain.  Differential diagnosis includes pancreatitis, esophagitis, GERD   Lab Tests:  I Ordered, and personally interpreted labs.  The pertinent results include: CBC with a white count of 13.2, CMP with evidence of mild ovation of anion gap 3.5, lipase is slightly elevated at 93   Imaging Studies ordered:  I ordered imaging studies including CT abdomen pelvis I independently visualized and interpreted imaging which showed interval increase in size of pseudocyst, possible fistula formation between the pancreatic pseudocyst and the colon.  I agree with the radiologist interpretation   Medicines ordered and prescription drug management:  I ordered medication including fluids, morphine for pancreatitis Reevaluation of the patient after these medicines showed that the patient improved I have reviewed the patients home medicines and have made adjustments as needed   Problem List / ED Course:  Patient presents to the emergency department concerns of epigastric and right upper abdominal pain.  States that he has a history of pancreatitis and typically the symptoms are brought on after some drinking.  States that he drank 3 shots of vodka last night.  Denies any vomiting or diarrhea but states that he is having some nausea and this is typically due to the abdominal cramping is feeling.  He has previously been admitted to the hospital for episodes of acute pancreatitis.  Denies any oral intake at all today due to symptoms.  Denies any fevers.  Will evaluate with some basic labs as well as CT abdomen pelvis due to prior history of pseudocyst.  Fluids and morphine ordered for symptomatic control. CT abdomen read concerning for possible fistula between pseudocyst and colon at the hepatic flexure. Will consult GI regarding this. Spoke with Dr. Barron Alvine, gastroenterology, regarding patient's case and CT abdomen findings. Advised admission if patient is  struggling to achieve symptom control, but otherwise encouraged outpatient GI follow up with possible referral to [facility] or other academic center for this.  Patient reporting that pain is now back to baseline. Concern about discharging home given level of pain. Will treat with 1mg  of Dilaudid. Very doubtful that pain control will be achieved given his level of pain. Will plan on consult to hospitalist for admission. Spoke with Dr. Loney Loh, hospitalist, and informed her of patient's case with concerns for possible fistula formation between pancreatic pseudocyst and colon. Given level of pain, patient requiring admission with close GI evaluation and potential intervention after discussion with Dr. Barron Alvine. Patient agreeable with this plan and no further questions at this time.   Final Clinical Impression(s) / ED Diagnoses Final diagnoses:  Epigastric abdominal pain    Rx / DC Orders ED Discharge Orders     None         Jory Tanguma,  Barnetta Chapel, PA-C 12/06/22 2043    Anders Simmonds T, DO 12/06/22 2105

## 2022-12-06 NOTE — H&P (Addendum)
History and Physical    Isaac Meza NWG:[id number] DOB: 1970/11/04 DOA: 12/06/2022  PCP: Margarita Mail, DO  Patient coming from: Home  Chief Complaint: Abdominal pain  HPI: Isaac Meza is a 52 y.o. male with medical history significant of alcohol abuse, pancreatic pseudocyst and chronic pancreatitis, status post EGD with EUS with aspiration of the pseudocyst in 07/2022, erosive gastropathy, hypertension, hyperlipidemia presented to the ED with epigastric abdominal pain and nausea.  Blood pressure elevated with systolic above 200 and diastolic in the 110s to 120s.  Afebrile.  Labs notable for WBC 13.2, hemoglobin 18.6, normal LFTs, lipase 93.  CT without evidence of acute pancreatitis but showing enlargement of the patient's pancreatic pseudocyst.  There is also concern for fistula formation between the colon and the pseudocyst.  Further evaluation with colonoscopy is recommended.  The appearance of the colon could reflect primary colitis versus secondary inflammatory involvement due to the adjacent pseudocyst and suspected fistula. ED PA discussed the case and CT findings with Dr. Barron Alvine who recommended admission for pain control and [facility] will consult in the morning and give further recommendations.  Patient was given IV hydralazine 10 mg, Dilaudid, morphine, Zofran, and 1 L LR.  [facility] called to admit.  Patient states today around 11 AM he started having severe epigastric and right upper quadrant abdominal pain associated with nausea but no vomiting.  He ate toast for breakfast but has not been able to eat anything else all day due to severe pain.  His last bowel movement was yesterday and since April he is having bowel movements every 3 days instead of daily.  He quit drinking for a few weeks after his hospitalization in April but then went back to drinking and now drinking 3 shots of vodka every single night, last drink was yesterday night.  States his blood pressure is always  elevated but he does not check it very often.  He is currently on lisinopril only for his hypertension.  Denies fevers, cough, shortness of breath, or chest pain.  Review of Systems:  Review of Systems  All other systems reviewed and are negative.   Past Medical History:  Diagnosis Date   Asthma    childhood   Hypertension     Past Surgical History:  Procedure Laterality Date   BACK SURGERY     ESOPHAGOGASTRODUODENOSCOPY (EGD) WITH PROPOFOL N/A 08/01/2022   Procedure: ESOPHAGOGASTRODUODENOSCOPY (EGD) WITH PROPOFOL;  Surgeon: Jeani Hawking, MD;  Location: [facility] ENDOSCOPY;  Service: Gastroenterology;  Laterality: N/A;   EUS N/A 08/01/2022   Procedure: UPPER ENDOSCOPIC ULTRASOUND (EUS) LINEAR;  Surgeon: Jeani Hawking, MD;  Location: [facility] ENDOSCOPY;  Service: Gastroenterology;  Laterality: N/A;   FINE NEEDLE ASPIRATION  08/01/2022   Procedure: FINE NEEDLE ASPIRATION (FNA) LINEAR;  Surgeon: Jeani Hawking, MD;  Location: [facility] ENDOSCOPY;  Service: Gastroenterology;;   FRACTURE SURGERY Right 2007   arm   XI ROBOTIC LAPAROSCOPIC ASSISTED APPENDECTOMY N/A 05/16/2022   Procedure: XI ROBOTIC LAPAROSCOPIC ASSISTED APPENDECTOMY;  Surgeon: Henrene Dodge, MD;  Location: [facility] ORS;  Service: General;  Laterality: N/A;     reports that he has been smoking cigarettes. He has never used smokeless tobacco. He reports current alcohol use of about 21.0 standard drinks of alcohol per week. He reports that he does not currently use drugs.  No Known Allergies  History reviewed. No pertinent family history.  Prior to Admission medications   Medication Sig Start Date End Date Taking? Authorizing Provider  acetaminophen (TYLENOL) 500 MG tablet Take 2  tablets (1,000 mg total) by mouth every 6 (six) hours as needed for mild pain. 05/16/22   Henrene Dodge, MD  ibuprofen (ADVIL) 600 MG tablet Take 1 tablet (600 mg total) by mouth every 8 (eight) hours as needed for moderate pain. 05/16/22   Henrene Dodge, MD  lisinopril  (ZESTRIL) 20 MG tablet Take 1 tablet (20 mg total) by mouth daily. 11/18/22   Margarita Mail, DO  oxyCODONE (OXY IR/ROXICODONE) 5 MG immediate release tablet Take 1 tablet (5 mg total) by mouth every 4 (four) hours as needed for severe pain. 05/16/22   Henrene Dodge, MD  pantoprazole (PROTONIX) 40 MG tablet Take 1 tablet (40 mg total) by mouth daily. 11/18/22   Margarita Mail, DO    Physical Exam: Vitals:   12/06/22 1451 12/06/22 1645 12/06/22 1830 12/06/22 1845  BP: (!) 211/117 (!) 199/106 (S) (!) 205/123 (!) 195/118  Pulse: 65 64 77 87  Resp: 18 15 12 12   Temp: (!) 97.3 F (36.3 C)     TempSrc: Oral     SpO2: 100% 100% 97% 96%    Physical Exam Vitals reviewed.  Constitutional:      General: He is not in acute distress. HENT:     Head: Normocephalic and atraumatic.  Eyes:     Extraocular Movements: Extraocular movements intact.  Cardiovascular:     Rate and Rhythm: Normal rate and regular rhythm.     Pulses: Normal pulses.  Pulmonary:     Effort: Pulmonary effort is normal. No respiratory distress.     Breath sounds: Normal breath sounds. No wheezing or rales.  Abdominal:     General: There is no distension.     Palpations: Abdomen is soft.     Tenderness: There is abdominal tenderness. There is no guarding or rebound.     Comments: Hyperactive bowel sounds Epigastrium and right upper quadrant tender to palpation  Musculoskeletal:     Cervical back: Normal range of motion.     Right lower leg: No edema.     Left lower leg: No edema.  Skin:    General: Skin is warm and dry.  Neurological:     General: No focal deficit present.     Mental Status: He is alert and oriented to person, place, and time.     Labs on Admission: I have personally reviewed following labs and imaging studies  CBC: Recent Labs  Lab 12/06/22 1506  WBC 13.2*  HGB 18.3*  HCT 55.5*  MCV 94.7  PLT 237   Basic Metabolic Panel: Recent Labs  Lab 12/06/22 1506  NA 138  K 3.5  CL 99   CO2 22  GLUCOSE 121*  BUN 9  CREATININE 0.83  CALCIUM 8.9   GFR: CrCl cannot be calculated (Unknown ideal weight.). Liver Function Tests: Recent Labs  Lab 12/06/22 1506  AST 19  ALT 10  ALKPHOS 73  BILITOT 0.8  PROT 6.7  ALBUMIN 3.9   Recent Labs  Lab 12/06/22 1506  LIPASE 93*   No results for input(s): "AMMONIA" in the last 168 hours. Coagulation Profile: No results for input(s): "INR", "PROTIME" in the last 168 hours. Cardiac Enzymes: No results for input(s): "CKTOTAL", "CKMB", "CKMBINDEX", "TROPONINI" in the last 168 hours. BNP (last 3 results) No results for input(s): "PROBNP" in the last 8760 hours. HbA1C: No results for input(s): "HGBA1C" in the last 72 hours. CBG: No results for input(s): "GLUCAP" in the last 168 hours. Lipid Profile: No results for input(s): "CHOL", "HDL", "  LDLCALC", "TRIG", "CHOLHDL", "LDLDIRECT" in the last 72 hours. Thyroid Function Tests: No results for input(s): "TSH", "T4TOTAL", "FREET4", "T3FREE", "THYROIDAB" in the last 72 hours. Anemia Panel: No results for input(s): "VITAMINB12", "FOLATE", "FERRITIN", "TIBC", "IRON", "RETICCTPCT" in the last 72 hours. Urine analysis:    Component Value Date/Time   COLORURINE YELLOW (A) 07/27/2022 1705   APPEARANCEUR CLEAR (A) 07/27/2022 1705   LABSPEC >1.046 (H) 07/27/2022 1705   PHURINE 6.0 07/27/2022 1705   GLUCOSEU NEGATIVE 07/27/2022 1705   HGBUR NEGATIVE 07/27/2022 1705   BILIRUBINUR NEGATIVE 07/27/2022 1705   KETONESUR 80 (A) 07/27/2022 1705   PROTEINUR NEGATIVE 07/27/2022 1705   NITRITE NEGATIVE 07/27/2022 1705   LEUKOCYTESUR NEGATIVE 07/27/2022 1705    Radiological Exams on Admission: CT ABDOMEN PELVIS W CONTRAST  Result Date: 12/06/2022 CLINICAL DATA:  Acute severe pancreatitis EXAM: CT ABDOMEN AND PELVIS WITH CONTRAST TECHNIQUE: Multidetector CT imaging of the abdomen and pelvis was performed using the standard protocol following bolus administration of intravenous contrast.  RADIATION DOSE REDUCTION: This exam was performed according to the departmental dose-optimization program which includes automated exposure control, adjustment of the mA and/or kV according to patient size and/or use of iterative reconstruction technique. CONTRAST:  75mL OMNIPAQUE IOHEXOL 350 MG/ML SOLN COMPARISON:  09/29/2022 FINDINGS: Lower chest: Hypoventilatory changes at the lung bases. No acute pleural or parenchymal lung disease. Hepatobiliary: No focal liver abnormality is seen. No gallstones, gallbladder wall thickening, or biliary dilatation. Pancreas: There is a complex rim enhancing fluid collection along the ventral margin of the pancreatic head, measuring 3.5 x 3.1 cm in transverse direction and extending 7.5 cm in craniocaudal extent. There is hyperdense material within the inferior margin of this fluid collection, and findings are consistent with pancreatic pseudocyst. There are no acute inflammatory changes involving the pancreas. No pancreatic duct dilation. Spleen: Normal in size without focal abnormality. Adrenals/Urinary Tract: Adrenal glands are unremarkable. Kidneys are normal, without renal calculi, focal lesion, or hydronephrosis. Bladder is unremarkable. Stomach/Bowel: No bowel obstruction or ileus. There is segmental wall thickening and pericolonic fat stranding involving the hepatic flexure of the colon, extending approximately 8 cm in length. This inflamed colonic segment abuts the pancreatic pseudocyst described above, with loss of intervening fat plane. I cannot exclude underlying communication between the pseudocyst and the hepatic flexure of colon, best seen on image 56/6, image 39/7, and image 30/8. Further evaluation with colonoscopy may be useful to exclude underlying fistula. The appendix is surgically absent. Diverticulosis of the sigmoid colon without diverticulitis. Vascular/Lymphatic: Aortic atherosclerosis. No enlarged abdominal or pelvic lymph nodes. Reproductive: Prostate  is unremarkable. Other: There is no free fluid or free intraperitoneal gas. No abdominal wall hernia. Musculoskeletal: No acute or destructive bony abnormalities. Reconstructed images demonstrate no additional findings. IMPRESSION: 1. Interval enlargement of the pancreatic pseudocyst seen on prior exam. No evidence of acute pancreatitis. 2. Since the prior study, there is interval development of segmental colonic wall thickening and pericolonic fat stranding at the hepatic flexure of the colon, with loss of fat plane between the inflamed colon and pseudocyst. Several images suggest fistula formation between the inflamed colon and the pseudocyst, with high attenuation material within the inferior margin of the pseudocyst possibly representing bowel contents. Further evaluation with colonoscopy is recommended. The appearance of the colon could reflect primary colitis versus secondary inflammatory involvement due to the adjacent pseudocyst and suspected fistula. 3. Sigmoid diverticulosis without diverticulitis. 4.  Aortic Atherosclerosis (ICD10-I70.0). Electronically Signed   By: Sharlet Salina M.D.   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  Status post EGD with EUS with aspiration of the pseudocyst in 07/2022.  He is presenting with complaints of severe epigastric and right upper quadrant abdominal pain associated with nausea.  WBC 13.2 and slightly tachycardic.  He is afebrile.  Normal LFTs, lipase 93. CT without evidence of acute pancreatitis but showing enlargement of the patient's pancreatic pseudocyst.  There is also concern for fistula formation between the colon and the pseudocyst.  Further evaluation with colonoscopy is recommended.  The appearance of the colon could reflect primary  colitis versus secondary inflammatory involvement due to the adjacent pseudocyst and suspected fistula. Continue IV fluid hydration, IV Dilaudid as needed for severe pain, and antiemetic as needed.  EKG ordered to check QT interval.  Blood cultures ordered and check lactate.  Start antibiotics including ceftriaxone and metronidazole.  Keep n.p.o. after midnight.  [facility] consulted.  Hypertensive urgency Blood pressure now improved after he was given IV hydralazine 10 mg in the ED.  Continue home lisinopril.  IV hydralazine PRN SBP >160.  Continue pain management.  Alcohol use disorder Slightly tachycardic but no other signs of withdrawal at this time.  Placed on CIWA protocol; Ativan as needed.  Thiamine, folate, and multivitamin.  Erosive gastropathy Continue Protonix.  Erythrocytosis Repeat CBC in the morning to confirm.  DVT prophylaxis: SCDs Code Status: Full Code (discussed with patient) Family Communication: Wife at bedside. Consults called:  GI Level of care: Progressive Care Unit Admission status: It is my clinical opinion that referral for OBSERVATION is reasonable and necessary in this patient based on the above information provided. The aforementioned taken together are felt to place the patient at high risk for further clinical deterioration. However, it is anticipated that the patient may be medically stable for discharge from the hospital within 24 to 48 hours.  John Giovanni MD [facility]  If 7PM-7AM, please contact night-coverage [email or web address]  12/06/2022, 8:30 PM

## 2022-12-06 NOTE — ED Notes (Signed)
Patient transported to CT 

## 2022-12-06 NOTE — ED Notes (Signed)
ED TO INPATIENT HANDOFF REPORT  ED Nurse Name and Phone #:  Theadora Rama [phone]  S Name/Age/Gender Isaac Meza 52 y.o. male Room/Bed: 008C/008C  Code Status   Code Status: Full Code  Home/SNF/Other Home Patient oriented to: self, place, time, and situation Is this baseline? Yes   Triage Complete: Triage complete  Chief Complaint Epigastric abdominal pain [R10.13]  Triage Note Pt reports severe RUQ pain, hx of pancreatitis. Some nausea. States he still drinks about 3 shots a liquor a day, last drink was last night   Allergies No Known Allergies  Level of Care/Admitting Diagnosis ED Disposition     ED Disposition  Admit   Condition  --   Comment  Hospital Area: [facility] [[id number]]  Level of Care: Progressive [102]  Admit to Progressive based on following criteria: GI, ENDOCRINE disease patients with GI bleeding, acute liver failure or pancreatitis, stable with diabetic ketoacidosis or thyrotoxicosis (hypothyroid) state.  May place patient in observation at [facility] or [facility] if equivalent level of care is available:: Yes  Covid Evaluation: Asymptomatic - no recent exposure (last 10 days) testing not required  Diagnosis: Epigastric abdominal pain [214841]  Admitting Physician: John Giovanni [[id number]]  Attending Physician: John Giovanni [[id number]]          B Medical/Surgery History Past Medical History:  Diagnosis Date   Asthma    childhood   Hypertension    Past Surgical History:  Procedure Laterality Date   BACK SURGERY     ESOPHAGOGASTRODUODENOSCOPY (EGD) WITH PROPOFOL N/A 08/01/2022   Procedure: ESOPHAGOGASTRODUODENOSCOPY (EGD) WITH PROPOFOL;  Surgeon: Jeani Hawking, MD;  Location: [facility] ENDOSCOPY;  Service: Gastroenterology;  Laterality: N/A;   EUS N/A 08/01/2022   Procedure: UPPER ENDOSCOPIC ULTRASOUND (EUS) LINEAR;  Surgeon: Jeani Hawking, MD;  Location: [facility] ENDOSCOPY;  Service: Gastroenterology;  Laterality: N/A;   FINE  NEEDLE ASPIRATION  08/01/2022   Procedure: FINE NEEDLE ASPIRATION (FNA) LINEAR;  Surgeon: Jeani Hawking, MD;  Location: [facility] ENDOSCOPY;  Service: Gastroenterology;;   FRACTURE SURGERY Right 2007   arm   XI ROBOTIC LAPAROSCOPIC ASSISTED APPENDECTOMY N/A 05/16/2022   Procedure: XI ROBOTIC LAPAROSCOPIC ASSISTED APPENDECTOMY;  Surgeon: Henrene Dodge, MD;  Location: [facility] ORS;  Service: General;  Laterality: N/A;     A IV Location/Drains/Wounds Patient Lines/Drains/Airways Status     Active Line/Drains/Airways     Name Placement date Placement time Site Days   Peripheral IV 12/06/22 20 G Anterior;Distal;Right;Upper Arm 12/06/22  1701  Arm  less than 1   Incision - 3 Ports 1: Left;Mid 2: Left;Upper 3: Left;Lower 05/16/22  1339  -- 204            Intake/Output Last 24 hours  Intake/Output Summary (Last 24 hours) at 12/06/2022 2252 Last data filed at 12/06/2022 1842 Gross per 24 hour  Intake 999 ml  Output --  Net 999 ml    Labs/Imaging Results for orders placed or performed during the hospital encounter of 12/06/22 (from the past 48 hour(s))  Lipase, blood     Status: Abnormal   Collection Time: 12/06/22  3:06 PM  Result Value Ref Range   Lipase 93 (H) 11 - 51 U/L    Comment: Performed at [facility], [address] [place], [place] [address]  Comprehensive metabolic panel     Status: Abnormal   Collection Time: 12/06/22  3:06 PM  Result Value Ref Range   Sodium 138 135 - 145 mmol/L   Potassium 3.5 3.5 -  5.1 mmol/L   Chloride 99 98 - 111 mmol/L   CO2 22 22 - 32 mmol/L   Glucose, Bld 121 (H) 70 - 99 mg/dL    Comment: Glucose reference range applies only to samples taken after fasting for at least 8 hours.   BUN 9 6 - 20 mg/dL   Creatinine, Ser 4.25 0.61 - 1.24 mg/dL   Calcium 8.9 8.9 - 95.6 mg/dL   Total Protein 6.7 6.5 - 8.1 g/dL   Albumin 3.9 3.5 - 5.0 g/dL   AST 19 15 - 41 U/L   ALT 10 0 - 44 U/L   Alkaline Phosphatase 73 38 - 126 U/L   Total Bilirubin 0.8 0.3 -  1.2 mg/dL   GFR, Estimated >38 >75 mL/min    Comment: (NOTE) Calculated using the CKD-EPI Creatinine Equation (2021)    Anion gap 17 (H) 5 - 15    Comment: Performed at [facility], [address], [place], [place] [address]  CBC     Status: Abnormal   Collection Time: 12/06/22  3:06 PM  Result Value Ref Range   WBC 13.2 (H) 4.0 - 10.5 K/uL   RBC 5.86 (H) 4.22 - 5.81 MIL/uL   Hemoglobin 18.3 (H) 13.0 - 17.0 g/dL   HCT 95.1 (H) 88.4 - 16.6 %   MCV 94.7 80.0 - 100.0 fL   MCH 31.2 26.0 - 34.0 pg   MCHC 33.0 30.0 - 36.0 g/dL   RDW 06.3 01.6 - 01.0 %   Platelets 237 150 - 400 K/uL   nRBC 0.0 0.0 - 0.2 %    Comment: Performed at [facility], [address] [place], [place] [address]   CT ABDOMEN PELVIS W CONTRAST  Result Date: 12/06/2022 CLINICAL DATA:  Acute severe pancreatitis EXAM: CT ABDOMEN AND PELVIS WITH CONTRAST TECHNIQUE: Multidetector CT imaging of the abdomen and pelvis was performed using the standard protocol following bolus administration of intravenous contrast. RADIATION DOSE REDUCTION: This exam was performed according to the departmental dose-optimization program which includes automated exposure control, adjustment of the mA and/or kV according to patient size and/or use of iterative reconstruction technique. CONTRAST:  75mL OMNIPAQUE IOHEXOL 350 MG/ML SOLN COMPARISON:  09/29/2022 FINDINGS: Lower chest: Hypoventilatory changes at the lung bases. No acute pleural or parenchymal lung disease. Hepatobiliary: No focal liver abnormality is seen. No gallstones, gallbladder wall thickening, or biliary dilatation. Pancreas: There is a complex rim enhancing fluid collection along the ventral margin of the pancreatic head, measuring 3.5 x 3.1 cm in transverse direction and extending 7.5 cm in craniocaudal extent. There is hyperdense material within the inferior margin of this fluid collection, and findings are consistent with pancreatic pseudocyst. There are no acute  inflammatory changes involving the pancreas. No pancreatic duct dilation. Spleen: Normal in size without focal abnormality. Adrenals/Urinary Tract: Adrenal glands are unremarkable. Kidneys are normal, without renal calculi, focal lesion, or hydronephrosis. Bladder is unremarkable. Stomach/Bowel: No bowel obstruction or ileus. There is segmental wall thickening and pericolonic fat stranding involving the hepatic flexure of the colon, extending approximately 8 cm in length. This inflamed colonic segment abuts the pancreatic pseudocyst described above, with loss of intervening fat plane. I cannot exclude underlying communication between the pseudocyst and the hepatic flexure of colon, best seen on image 56/6, image 39/7, and image 30/8. Further evaluation with colonoscopy may be useful to exclude underlying fistula. The appendix is surgically absent. Diverticulosis of the sigmoid colon without diverticulitis. Vascular/Lymphatic: Aortic atherosclerosis. No enlarged  abdominal or pelvic lymph nodes. Reproductive: Prostate is unremarkable. Other: There is no free fluid or free intraperitoneal gas. No abdominal wall hernia. Musculoskeletal: No acute or destructive bony abnormalities. Reconstructed images demonstrate no additional findings. IMPRESSION: 1. Interval enlargement of the pancreatic pseudocyst seen on prior exam. No evidence of acute pancreatitis. 2. Since the prior study, there is interval development of segmental colonic wall thickening and pericolonic fat stranding at the hepatic flexure of the colon, with loss of fat plane between the inflamed colon and pseudocyst. Several images suggest fistula formation between the inflamed colon and the pseudocyst, with high attenuation material within the inferior margin of the pseudocyst possibly representing bowel contents. Further evaluation with colonoscopy is recommended. The appearance of the colon could reflect primary colitis versus secondary inflammatory  involvement due to the adjacent pseudocyst and suspected fistula. 3. Sigmoid diverticulosis without diverticulitis. 4.  Aortic Atherosclerosis (ICD10-I70.0). Electronically Signed   By: Sharlet Salina M.D.   On: 12/06/2022 18:37    Pending Labs Unresulted Labs (From admission, onward)     Start     Ordered   12/07/22 0500  CBC  Tomorrow morning,   R        12/06/22 2119   12/07/22 0500  Basic metabolic panel  Tomorrow morning,   R        12/06/22 2119   12/06/22 2124  Culture, blood (Routine X 2) w Reflex to ID Panel  BLOOD CULTURE X 2,   R (with TIMED occurrences)      12/06/22 2123   12/06/22 2124  Lactic acid, plasma  (Lactic Acid)  ONCE - STAT,   STAT        12/06/22 2124            Vitals/Pain Today's Vitals   12/06/22 1909 12/06/22 2000 12/06/22 2125 12/06/22 2220  BP:  (!) 138/122  (!) 157/97  Pulse:  (!) 101  77  Resp:  16  17  Temp:      TempSrc:      SpO2:  100%  98%  PainSc: 10-Worst pain ever  9      Isolation Precautions No active isolations  Medications Medications  hydrALAZINE (APRESOLINE) injection 10 mg (has no administration in time range)  HYDROmorphone (DILAUDID) injection 1 mg (has no administration in time range)  naloxone (NARCAN) injection 0.4 mg (has no administration in time range)  ondansetron (ZOFRAN) injection 4 mg (has no administration in time range)  lisinopril (ZESTRIL) tablet 20 mg (has no administration in time range)  pantoprazole (PROTONIX) EC tablet 40 mg (has no administration in time range)  acetaminophen (TYLENOL) tablet 650 mg (has no administration in time range)    Or  acetaminophen (TYLENOL) suppository 650 mg (has no administration in time range)  LORazepam (ATIVAN) tablet 1-4 mg (has no administration in time range)    Or  LORazepam (ATIVAN) injection 1-4 mg (has no administration in time range)  thiamine (VITAMIN B1) tablet 100 mg (100 mg Oral Given 12/06/22 2224)    Or  thiamine (VITAMIN B1) injection 100 mg (  Intravenous See Alternative 12/06/22 2224)  folic acid (FOLVITE) tablet 1 mg (1 mg Oral Given 12/06/22 2224)  multivitamin with minerals tablet 1 tablet (1 tablet Oral Given 12/06/22 2224)  0.9 %  sodium chloride infusion ( Intravenous New Bag/Given 12/06/22 2222)  cefTRIAXone (ROCEPHIN) 2 g in sodium chloride 0.9 % 100 mL IVPB (2 g Intravenous New Bag/Given 12/06/22 2224)  metroNIDAZOLE (FLAGYL) IVPB 500 mg (  has no administration in time range)  lactated ringers bolus 1,000 mL (0 mLs Intravenous Stopped 12/06/22 1842)  morphine (PF) 4 MG/ML injection 4 mg (4 mg Intravenous Given 12/06/22 1702)  iohexol (OMNIPAQUE) 350 MG/ML injection 75 mL (75 mLs Intravenous Contrast Given 12/06/22 1715)  ondansetron (ZOFRAN) injection 4 mg (4 mg Intravenous Given 12/06/22 1759)  hydrALAZINE (APRESOLINE) injection 10 mg (10 mg Intravenous Given 12/06/22 1911)  HYDROmorphone (DILAUDID) injection 1 mg (1 mg Intravenous Given 12/06/22 2057)    Mobility walks     Focused Assessments Neuro Assessment Handoff:  Swallow screen pass? Yes          Neuro Assessment:   Neuro Checks:      Has TPA been given? No If patient is a Neuro Trauma and patient is going to OR before floor call report to 4N Charge nurse: [phone] or [phone]  , Pulmonary Assessment Handoff:  Lung sounds:   O2 Device: Room Air      R Recommendations: See Admitting Provider Note  Report given to:   Additional Notes:

## 2022-12-06 NOTE — ED Triage Notes (Addendum)
Pt reports severe RUQ pain, hx of pancreatitis. Some nausea. States he still drinks about 3 shots a liquor a day, last drink was last night

## 2022-12-07 DIAGNOSIS — Z8719 Personal history of other diseases of the digestive system: Secondary | ICD-10-CM

## 2022-12-07 DIAGNOSIS — F1010 Alcohol abuse, uncomplicated: Secondary | ICD-10-CM | POA: Diagnosis not present

## 2022-12-07 DIAGNOSIS — R1013 Epigastric pain: Secondary | ICD-10-CM | POA: Diagnosis not present

## 2022-12-07 DIAGNOSIS — R110 Nausea: Secondary | ICD-10-CM | POA: Diagnosis not present

## 2022-12-07 DIAGNOSIS — K863 Pseudocyst of pancreas: Secondary | ICD-10-CM

## 2022-12-07 LAB — CBC
HCT: 50.5 % (ref 39.0–52.0)
Hemoglobin: 16.8 g/dL (ref 13.0–17.0)
MCH: 31.8 pg (ref 26.0–34.0)
MCHC: 33.3 g/dL (ref 30.0–36.0)
MCV: 95.5 fL (ref 80.0–100.0)
Platelets: 227 10*3/uL (ref 150–400)
RBC: 5.29 MIL/uL (ref 4.22–5.81)
RDW: 12.6 % (ref 11.5–15.5)
WBC: 10.6 10*3/uL — ABNORMAL HIGH (ref 4.0–10.5)
nRBC: 0 % (ref 0.0–0.2)

## 2022-12-07 LAB — BASIC METABOLIC PANEL
Anion gap: 11 (ref 5–15)
BUN: 6 mg/dL (ref 6–20)
CO2: 19 mmol/L — ABNORMAL LOW (ref 22–32)
Calcium: 8.2 mg/dL — ABNORMAL LOW (ref 8.9–10.3)
Chloride: 104 mmol/L (ref 98–111)
Creatinine, Ser: 0.65 mg/dL (ref 0.61–1.24)
GFR, Estimated: >60 mL/min (ref >60)
Glucose, Bld: 93 mg/dL (ref 70–99)
Potassium: 4.2 mmol/L (ref 3.5–5.1)
Sodium: 134 mmol/L — ABNORMAL LOW (ref 135–145)

## 2022-12-07 LAB — MAGNESIUM: Magnesium: 1.8 mg/dL (ref 1.7–2.4)

## 2022-12-07 LAB — LACTIC ACID, PLASMA: Lactic Acid, Venous: 1.4 mmol/L (ref 0.5–1.9)

## 2022-12-07 MED ORDER — OXYCODONE HCL 5 MG PO TABS: 5.0000 mg | ORAL_TABLET | Freq: Four times a day (QID) | ORAL | 0 refills | Status: DC | PRN

## 2022-12-07 MED ORDER — SODIUM CHLORIDE 0.9 % IV SOLN: INTRAVENOUS | Status: DC

## 2022-12-07 NOTE — Plan of Care (Signed)
  Problem: Education: Goal: Knowledge of General Education information will improve Description: Including pain rating scale, medication(s)/side effects and non-pharmacologic comfort measures Outcome: Progressing   Problem: Health Behavior/Discharge Planning: Goal: Ability to manage health-related needs will improve Outcome: Progressing   Problem: Clinical Measurements: Goal: Ability to maintain clinical measurements within normal limits will improve Outcome: Progressing   Problem: Activity: Goal: Risk for activity intolerance will decrease Outcome: Progressing   Problem: Pain Managment: Goal: General experience of comfort will improve Outcome: Not Progressing

## 2022-12-07 NOTE — TOC Initial Note (Signed)
Transition of Care [facility]) - Initial/Assessment Note    Patient Details  Name: Isaac Meza MRN: [id number] Date of Birth: 10/15/1970  [facility] CM/SW Contact:    Maree Krabbe, LCSW Phone Number: 12/07/2022, 11:38 AM  Clinical Narrative:      Substance abuse resources added to AVS.                   Patient Goals and CMS Choice            Expected Discharge Plan and Services                                              Prior Living Arrangements/Services                       Activities of Daily Living Home Assistive Devices/Equipment: None ADL Screening (condition at time of admission) Patient's cognitive ability adequate to safely complete daily activities?: Yes Is the patient deaf or have difficulty hearing?: No Does the patient have difficulty seeing, even when wearing glasses/contacts?: No Does the patient have difficulty concentrating, remembering, or making decisions?: No Patient able to express need for assistance with ADLs?: Yes Does the patient have difficulty dressing or bathing?: No Independently performs ADLs?: Yes (appropriate for developmental age) Does the patient have difficulty walking or climbing stairs?: No Weakness of Legs: None Weakness of Arms/Hands: None  Permission Sought/Granted                  Emotional Assessment              Admission diagnosis:  Epigastric abdominal pain [R10.13] Patient Active Problem List   Diagnosis Date Noted   Epigastric abdominal pain 12/06/2022   Hypertensive urgency 12/06/2022   Alcohol use disorder 12/06/2022   Erosive gastropathy 12/06/2022   Erythrocytosis 12/06/2022   Protein-calorie malnutrition, severe 08/01/2022   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   Abnormal CT of the abdomen 07/30/2022   Pancreatic cyst 07/30/2022   Nausea and vomiting 07/30/2022   Chronic pancreatitis (HCC) 07/29/2022   Dehydration 07/29/2022   Duodenal obstruction 07/27/2022    Acute appendicitis 05/16/2022   Hypertension 04/30/2022   Long term current use of opiate analgesic 09/12/2020   Bilateral thoracic back pain 10/13/2017   PCP:  Margarita Mail, DO Pharmacy:   [id number] [place], [place] - [address] AT [facility] [address] [place] [address] Phone: [phone] Fax: [phone]     Social Determinants of Health (SDOH) Social History: SDOH Screenings   Food Insecurity: No Food Insecurity (12/06/2022)  Housing: Low Risk  (12/06/2022)  Transportation Needs: No Transportation Needs (12/06/2022)  Utilities: Not At Risk (12/06/2022)  Depression (PHQ2-9): Low Risk  (11/18/2022)  Tobacco Use: High Risk (12/06/2022)   SDOH Interventions:     Readmission Risk Interventions     No data to display

## 2022-12-07 NOTE — Consult Note (Addendum)
Consultation  Referring Provider:  [facility]  Primary Care Physician:  Margarita Mail, DO Primary Gastroenterologist:  [facility] (Dr. Sharlet Salina)       Reason for Consultation:     Pancreatic pseudocyst  LOS: 0 days          HPI:   Isaac Meza is a 52 y.o. male with past medical history significant for emergent appendectomy 05/2022, alcohol abuse (3 shots vodka daily), alcoholic pancreatitis with pancreatic pseudocyst 07/2022, presents for evaluation of epigastric pain and nausea.  Interval history - Admitted April 2024 with severe upper abdominal pain/alcohol induced pancreatitis.  CT scan showed chronic pancreatic pseudocyst with fluid collection, adjacent to pancreatic head and duodenum. - Underwent EUS with FNA by Dr. Elnoria Howard 08/01/2022.  Fluid from cyst showed NO malignant cells.  Erosive gastropathy noted on EGD.   -Patient followed with Celso Amy PA-C [facility] 08/2022. Patient started on Protonix 40mg  twice daily and repeat CT ab pelvis with contrast 10/03/2022 showed decrease in pseudocyst with possible new inflammatory changes suggestive of acute pancreatitis.  Lipase was ordered but patient did not complete lab.  Patient was scheduled for follow-up with Dr. Sharlet Salina July 2024 but was lost to follow-up.  THIS ADMISSION Patient presented to ED 8/31 with epigastric pain and nausea. Reports Saturday 8/31 around 11 am soon after eating breakfast of buttered toast with jelly he had acute onset severe epigastric pain with nausea (no vomiting) that progressed throughout the day prompting his ED visit.  Patient reports daily vodka use with last drink being Friday 8/30 p.m.  States he has 1 drink that contains 3 shots of vodka on a daily basis.  In ED patient was hypertensive (211/117).  Labs notable for leukocytosis with WBC 13.2, hemoconcentrated with Hgb 18.6.  Normal LFTs.  Lipase 93. CT abdomen pelvis with contrast shows fluid collection measuring 3.5 x 3.1 cm and extending 7.5 cm  (interval enlargement of his known pancreatic pseudocyst).  No evidence of acute pancreatitis.  Possible fistula formation between inflamed colon (hepatic flexure) and pseudocyst.  Appearance of colon could reflect primary colitis versus secondary inflammation involvement due to adjacent pseudocyst and suspected fistula.  Patient denies fever/chills.  Reports no change in his bowel habits.  Baseline constipation where he has a bowel movement that involve straining every 3 to 4 days.  Denies melena/hematochezia..  The patient and his wife report negative Cologuard testing this year.   PREVIOUS GI WORKUP   EUS 08/01/2022 - Erosive gastropathy with stigmata of recent bleeding - Cystic lesion seen in pancreatic head.  FNA performed (fluid showed no malignant cells). - Enlarged lymph node visualized in peripancreatic region   Past Medical History:  Diagnosis Date   Asthma    childhood   Hypertension     Surgical History:  He  has a past surgical history that includes Fracture surgery (Right, 2007); Back surgery; Xi robotic laparoscopic assisted appendectomy (N/A, 05/16/2022); Esophagogastroduodenoscopy (egd) with propofol (N/A, 08/01/2022); EUS (N/A, 08/01/2022); and Fine needle aspiration (08/01/2022). Family History:  His family history is not on file. Social History:   reports that he has been smoking cigarettes. He has never used smokeless tobacco. He reports current alcohol use of about 21.0 standard drinks of alcohol per week. He reports that he does not currently use drugs.  Prior to Admission medications   Medication Sig Start Date End Date Taking? Authorizing Provider  acetaminophen (TYLENOL) 500 MG tablet Take 2 tablets (1,000 mg total) by mouth every  6 (six) hours as needed for mild pain. 05/16/22  Yes Piscoya, Elita Quick, MD  ibuprofen (ADVIL) 600 MG tablet Take 1 tablet (600 mg total) by mouth every 8 (eight) hours as needed for moderate pain. 05/16/22  Yes Piscoya, Elita Quick, MD  lisinopril  (ZESTRIL) 20 MG tablet Take 1 tablet (20 mg total) by mouth daily. 11/18/22  Yes Margarita Mail, DO  oxyCODONE (OXY IR/ROXICODONE) 5 MG immediate release tablet Take 1 tablet (5 mg total) by mouth every 4 (four) hours as needed for severe pain. 05/16/22  Yes Piscoya, Elita Quick, MD  pantoprazole (PROTONIX) 40 MG tablet Take 1 tablet (40 mg total) by mouth daily. 11/18/22  Yes Margarita Mail, DO    Current Facility-Administered Medications  Medication Dose Route Frequency Provider Last Rate Last Admin   acetaminophen (TYLENOL) tablet 650 mg  650 mg Oral Q6H PRN [facility] Giovanni, MD       Or   acetaminophen (TYLENOL) suppository 650 mg  650 mg Rectal Q6H PRN Ana Liaw Giovanni, MD       cefTRIAXone (ROCEPHIN) 2 g in sodium chloride 0.9 % 100 mL IVPB  2 g Intravenous Q24H Simara Rhyner Giovanni, MD   Stopped at 12/06/22 2300   folic acid (FOLVITE) tablet 1 mg  1 mg Oral Daily Meghann Landing Giovanni, MD   1 mg at 12/07/22 0850   hydrALAZINE (APRESOLINE) injection 10 mg  10 mg Intravenous Q6H PRN Edric Fetterman Giovanni, MD   10 mg at 12/06/22 2342   HYDROmorphone (DILAUDID) injection 1 mg  1 mg Intravenous Q4H PRN Pricella Gaugh Giovanni, MD   1 mg at 12/07/22 5784   lisinopril (ZESTRIL) tablet 20 mg  20 mg Oral Daily [place] Giovanni, MD   20 mg at 12/07/22 0850   LORazepam (ATIVAN) tablet 1-4 mg  1-4 mg Oral Q1H PRN Evelisse Szalkowski Giovanni, MD       Or   LORazepam (ATIVAN) injection 1-4 mg  1-4 mg Intravenous Q1H PRN Elbert Polyakov Giovanni, MD       metroNIDAZOLE (FLAGYL) IVPB 500 mg  500 mg Intravenous Q12H Beryl Hornberger Giovanni, MD   Stopped at 12/07/22 1006   multivitamin with minerals tablet 1 tablet  1 tablet Oral Daily Dale Strausser Giovanni, MD   1 tablet at 12/07/22 0851   naloxone (NARCAN) injection 0.4 mg  0.4 mg Intravenous PRN Layne Lebon Giovanni, MD       pantoprazole (PROTONIX) EC tablet 40 mg  40 mg Oral Daily Keyra Virella Giovanni, MD   40 mg at 12/07/22 0850   thiamine (VITAMIN B1) tablet 100 mg  100 mg Oral Daily  Rainn Bullinger Giovanni, MD   100 mg at 12/07/22 6962   Or   thiamine (VITAMIN B1) injection 100 mg  100 mg Intravenous Daily Chasten Blaze Giovanni, MD        Allergies as of 12/06/2022   (No Known Allergies)    Review of Systems  Constitutional:  Negative for chills, fever and weight loss.  HENT:  Negative for hearing loss and tinnitus.   Eyes:  Negative for blurred vision and double vision.  Respiratory:  Negative for cough and hemoptysis.   Cardiovascular:  Negative for chest pain and palpitations.  Gastrointestinal:  Positive for abdominal pain and constipation. Negative for blood in stool, diarrhea, heartburn, melena, nausea and vomiting.  Genitourinary:  Negative for dysuria and urgency.  Musculoskeletal:  Negative for myalgias and neck pain.  Skin:  Negative for itching and rash.  Neurological:  Negative for seizures and loss of consciousness.  Psychiatric/Behavioral:  Negative for depression and  suicidal ideas.        Physical Exam:  Vital signs in last 24 hours: Temp:  [97.3 F (36.3 C)-98.4 F (36.9 C)] 98.3 F (36.8 C) (09/01 0843) Pulse Rate:  [64-111] 71 (09/01 0843) Resp:  [10-18] 12 (09/01 0900) BP: (122-211)/(72-123) 139/102 (09/01 0843) SpO2:  [96 %-100 %] 98 % (09/01 0437) Weight:  [55.4 kg-55.5 kg] 55.4 kg (09/01 0437) Last BM Date : 12/06/22 Last BM recorded by nurses in past 5 days No data recorded  Physical Exam Constitutional:      General: He is not in acute distress.    Appearance: He is well-developed.  HENT:     Head: Normocephalic.     Nose: Nose normal. No congestion.     Mouth/Throat:     Mouth: Mucous membranes are moist.     Pharynx: Oropharynx is clear.  Eyes:     General: No scleral icterus.    Extraocular Movements: Extraocular movements intact.  Cardiovascular:     Rate and Rhythm: Normal rate and regular rhythm.  Pulmonary:     Effort: Pulmonary effort is normal. No respiratory distress.  Abdominal:     General: Bowel sounds are  normal. There is no distension.     Palpations: Abdomen is soft. There is no mass.     Tenderness: There is abdominal tenderness (mild epigastric). There is no guarding or rebound.     Hernia: No hernia is present.  Musculoskeletal:        General: No swelling. Normal range of motion.  Skin:    General: Skin is warm and dry.     Coloration: Skin is pale. Skin is not jaundiced.  Neurological:     General: No focal deficit present.     Mental Status: He is alert and oriented to Meza, place, and time.  Psychiatric:        Mood and Affect: Mood normal.        Behavior: Behavior normal.        Thought Content: Thought content normal.        Judgment: Judgment normal.      LAB RESULTS: Recent Labs    12/06/22 1506 12/07/22 0245  WBC 13.2* 10.6*  HGB 18.3* 16.8  HCT 55.5* 50.5  PLT 237 227   BMET Recent Labs    12/06/22 1506 12/07/22 0245  NA 138 134*  K 3.5 4.2  CL 99 104  CO2 22 19*  GLUCOSE 121* 93  BUN 9 6  CREATININE 0.83 0.65  CALCIUM 8.9 8.2*   LFT Recent Labs    12/06/22 1506  PROT 6.7  ALBUMIN 3.9  AST 19  ALT 10  ALKPHOS 73  BILITOT 0.8   PT/INR No results for input(s): "LABPROT", "INR" in the last 72 hours.  STUDIES: CT ABDOMEN PELVIS W CONTRAST  Result Date: 12/06/2022 CLINICAL DATA:  Acute severe pancreatitis EXAM: CT ABDOMEN AND PELVIS WITH CONTRAST TECHNIQUE: Multidetector CT imaging of the abdomen and pelvis was performed using the standard protocol following bolus administration of intravenous contrast. RADIATION DOSE REDUCTION: This exam was performed according to the departmental dose-optimization program which includes automated exposure control, adjustment of the mA and/or kV according to patient size and/or use of iterative reconstruction technique. CONTRAST:  75mL OMNIPAQUE IOHEXOL 350 MG/ML SOLN COMPARISON:  09/29/2022 FINDINGS: Lower chest: Hypoventilatory changes at the lung bases. No acute pleural or parenchymal lung disease.  Hepatobiliary: No focal liver abnormality is seen. No gallstones, gallbladder wall thickening, or biliary dilatation.  Pancreas: There is a complex rim enhancing fluid collection along the ventral margin of the pancreatic head, measuring 3.5 x 3.1 cm in transverse direction and extending 7.5 cm in craniocaudal extent. There is hyperdense material within the inferior margin of this fluid collection, and findings are consistent with pancreatic pseudocyst. There are no acute inflammatory changes involving the pancreas. No pancreatic duct dilation. Spleen: Normal in size without focal abnormality. Adrenals/Urinary Tract: Adrenal glands are unremarkable. Kidneys are normal, without renal calculi, focal lesion, or hydronephrosis. Bladder is unremarkable. Stomach/Bowel: No bowel obstruction or ileus. There is segmental wall thickening and pericolonic fat stranding involving the hepatic flexure of the colon, extending approximately 8 cm in length. This inflamed colonic segment abuts the pancreatic pseudocyst described above, with loss of intervening fat plane. I cannot exclude underlying communication between the pseudocyst and the hepatic flexure of colon, best seen on image 56/6, image 39/7, and image 30/8. Further evaluation with colonoscopy may be useful to exclude underlying fistula. The appendix is surgically absent. Diverticulosis of the sigmoid colon without diverticulitis. Vascular/Lymphatic: Aortic atherosclerosis. No enlarged abdominal or pelvic lymph nodes. Reproductive: Prostate is unremarkable. Other: There is no free fluid or free intraperitoneal gas. No abdominal wall hernia. Musculoskeletal: No acute or destructive bony abnormalities. Reconstructed images demonstrate no additional findings. IMPRESSION: 1. Interval enlargement of the pancreatic pseudocyst seen on prior exam. No evidence of acute pancreatitis. 2. Since the prior study, there is interval development of segmental colonic wall thickening and  pericolonic fat stranding at the hepatic flexure of the colon, with loss of fat plane between the inflamed colon and pseudocyst. Several images suggest fistula formation between the inflamed colon and the pseudocyst, with high attenuation material within the inferior margin of the pseudocyst possibly representing bowel contents. Further evaluation with colonoscopy is recommended. The appearance of the colon could reflect primary colitis versus secondary inflammatory involvement due to the adjacent pseudocyst and suspected fistula. 3. Sigmoid diverticulosis without diverticulitis. 4.  Aortic Atherosclerosis (ICD10-I70.0). Electronically Signed   By: Sharlet Salina M.D.   On: 12/06/2022 18:37      Impression    52 year old male history of alcohol abuse (3 shots of vodka daily) pancreatic pseudocyst April 2024 with CT June 2024 showing improvement now presents for acute epigastric pain and nausea with this CT concerning for enlargement of pseudocyst with possible fistula formation to the colon.  Normal lipase. No pancreatitis on imaging.  Pancreatic pseudocyst -CT abdomen pelvis with contrast shows enlargement of his pancreatic pseudocyst and concern for fistula formation between colon and pseudocyst - Hgb 16.8 - Improving leukocytosis with WBC 10.6 - Normal kidney function - Lipase 93    Plan   - Counseled patient on importance of cessation of alcohol use - No change in bowel habits.  No role for endoscopic procedures in this case.  Likely conservative management with repeat imaging in a few weeks per primary GI but will defer to attending for further recommendations.  Thank you for your kind consultation, we will continue to follow.   Bayley Leanna Sato  12/07/2022, 10:41 AM   GI ATTENDING  History, laboratories, x-rays, prior endoscopy reports reviewed.  Patient personally seen and examined.  Wife in room.  Agree with comprehensive consultation note as outlined above.  This patient with a  history of alcoholic pancreatitis presents with severe abdominal pain.  Noted to have pseudocyst on CT scan with inflammatory changes as described.  Feeling better today.  White blood cell count and hemoglobin have improved.  He is adamant about going home.  He has had issues with noncompliance in terms of medical follow-up and alcohol cessation.  He is tolerating his limited diet.  No fevers or other issues.  I explained to him complications that might arise from pancreatitis as well as pancreatic pseudocyst formation including, but not limited to, infection, rupture, pseudoaneurysm formation with bleeding, necrosis, and deranged pancreatic exocrine function (in severe cases endocrine function).  1.  Okay to go home from GI standpoint 2.  Low-fat diet 3.  Avoid all alcohol forever 4.  Follow-up with his local GI doctor regarding serial imaging over time and clinical progress  Will sign off.  Wilhemina Bonito. Eda Keys., M.D. [facility] Division of Gastroenterology

## 2022-12-07 NOTE — Progress Notes (Addendum)
PROGRESS NOTE    Isaac Meza  ZOX:[id number] DOB: Sep 11, 1970 DOA: 12/06/2022 PCP: Margarita Mail, DO  52/M with history of alcohol abuse, chronic pancreatitis and pancreatic pseudocyst, underwent EGD, EUS with aspiration of pseudocyst in 4/24, cytology was negative for malignancy presented to the ED with worsening epigastric abdominal pain yesterday and nausea. -Reports that he quit alcohol for few weeks in April after his episode, however shortly thereafter resumed daily EtOH use. -Labs in the ED noted WBC of 13.2, hemoglobin 18, normal LFTs, lipase 93, CT noted enlargement of pancreatic pseudocyst, colon wall thickening with some concern for fistula formation -Gastroenterology was consulted and patient was admitted to [facility] overnight   Subjective: -Continues to have abdominal pain, marginally better today  Assessment and Plan:  Acute on chronic pancreatitis Enlarging pancreatic pseudocyst with suspected fistula formation between the colon and the pseudocyst -sp  EGD with EUS with aspiration of the pseudocyst in 07/2022.  Cytology was negative for malignancy then -Unfortunately continues to drink EtOH every day -Now with worsening pain, CT evidence of enlargement of pseudocyst with suspected fistula with colon -Start clears, supportive care, continue IV's ceftriaxone and Flagyl, add IVF -Gastroenterology consult   Hypertensive urgency -Improved, continue lisinopril   Alcohol use disorder Slightly tachycardic but no other signs of withdrawal at this time.  Placed on CIWA protocol; Ativan as needed.  Thiamine, folate, and multivitamin.   Erosive gastropathy Continue Protonix.    DVT prophylaxis: SCDs Code Status: Full Code  Family Communication: None present Disposition: Home pending clinical improvement Consults called: [facility]   Consultants:    Procedures:   Antimicrobials:    Objective: Vitals:   12/07/22 0437 12/07/22 0800 12/07/22 0843 12/07/22 0900   BP: (!) 122/91  (!) 139/102   Pulse: 71  71   Resp: 12 13 12 12   Temp: 98.4 F (36.9 C)  98.3 F (36.8 C)   TempSrc: Oral  Oral   SpO2: 98%     Weight: 55.4 kg     Height:        Intake/Output Summary (Last 24 hours) at 12/07/2022 1120 Last data filed at 12/07/2022 1019 Gross per 24 hour  Intake 2718.28 ml  Output --  Net 2718.28 ml   Filed Weights   12/06/22 2340 12/07/22 0437  Weight: 55.5 kg 55.4 kg    Examination:  Chronically ill thinly built male sitting up in bed, AAOx3 HEENT: Face appears flushed, no JVD, no icterus CVS: S1-S2, regular rhythm Lungs: Clear bilaterally, poor air movement Abdomen: Soft, mild epigastric tenderness, no rigidity or rebound, nondistended Extremities: No edema  Psychiatry: Flat affect    Data Reviewed:   CBC: Recent Labs  Lab 12/06/22 1506 12/07/22 0245  WBC 13.2* 10.6*  HGB 18.3* 16.8  HCT 55.5* 50.5  MCV 94.7 95.5  PLT 237 227   Basic Metabolic Panel: Recent Labs  Lab 12/06/22 1506 12/07/22 0245  NA 138 134*  K 3.5 4.2  CL 99 104  CO2 22 19*  GLUCOSE 121* 93  BUN 9 6  CREATININE 0.83 0.65  CALCIUM 8.9 8.2*  MG  --  1.8   GFR: Estimated Creatinine Clearance: 86.6 mL/min (by C-G formula based on SCr of 0.65 mg/dL). Liver Function Tests: Recent Labs  Lab 12/06/22 1506  AST 19  ALT 10  ALKPHOS 73  BILITOT 0.8  PROT 6.7  ALBUMIN 3.9   Recent Labs  Lab 12/06/22 1506  LIPASE 93*   No results for input(s): "AMMONIA" in the last  168 hours. Coagulation Profile: No results for input(s): "INR", "PROTIME" in the last 168 hours. Cardiac Enzymes: No results for input(s): "CKTOTAL", "CKMB", "CKMBINDEX", "TROPONINI" in the last 168 hours. BNP (last 3 results) No results for input(s): "PROBNP" in the last 8760 hours. HbA1C: No results for input(s): "HGBA1C" in the last 72 hours. CBG: No results for input(s): "GLUCAP" in the last 168 hours. Lipid Profile: No results for input(s): "CHOL", "HDL", "LDLCALC",  "TRIG", "CHOLHDL", "LDLDIRECT" in the last 72 hours. Thyroid Function Tests: No results for input(s): "TSH", "T4TOTAL", "FREET4", "T3FREE", "THYROIDAB" in the last 72 hours. Anemia Panel: No results for input(s): "VITAMINB12", "FOLATE", "FERRITIN", "TIBC", "IRON", "RETICCTPCT" in the last 72 hours. Urine analysis:    Component Value Date/Time   COLORURINE YELLOW (A) 07/27/2022 1705   APPEARANCEUR CLEAR (A) 07/27/2022 1705   LABSPEC >1.046 (H) 07/27/2022 1705   PHURINE 6.0 07/27/2022 1705   GLUCOSEU NEGATIVE 07/27/2022 1705   HGBUR NEGATIVE 07/27/2022 1705   BILIRUBINUR NEGATIVE 07/27/2022 1705   KETONESUR 80 (A) 07/27/2022 1705   PROTEINUR NEGATIVE 07/27/2022 1705   NITRITE NEGATIVE 07/27/2022 1705   LEUKOCYTESUR NEGATIVE 07/27/2022 1705   Sepsis Labs: @LABRCNTIP (procalcitonin:4,lacticidven:4)  )No results found for this or any previous visit (from the past 240 hour(s)).   Radiology Studies: CT ABDOMEN PELVIS W CONTRAST  Result Date: 12/06/2022 CLINICAL DATA:  Acute severe pancreatitis EXAM: CT ABDOMEN AND PELVIS WITH CONTRAST TECHNIQUE: Multidetector CT imaging of the abdomen and pelvis was performed using the standard protocol following bolus administration of intravenous contrast. RADIATION DOSE REDUCTION: This exam was performed according to the departmental dose-optimization program which includes automated exposure control, adjustment of the mA and/or kV according to patient size and/or use of iterative reconstruction technique. CONTRAST:  75mL OMNIPAQUE IOHEXOL 350 MG/ML SOLN COMPARISON:  09/29/2022 FINDINGS: Lower chest: Hypoventilatory changes at the lung bases. No acute pleural or parenchymal lung disease. Hepatobiliary: No focal liver abnormality is seen. No gallstones, gallbladder wall thickening, or biliary dilatation. Pancreas: There is a complex rim enhancing fluid collection along the ventral margin of the pancreatic head, measuring 3.5 x 3.1 cm in transverse direction and  extending 7.5 cm in craniocaudal extent. There is hyperdense material within the inferior margin of this fluid collection, and findings are consistent with pancreatic pseudocyst. There are no acute inflammatory changes involving the pancreas. No pancreatic duct dilation. Spleen: Normal in size without focal abnormality. Adrenals/Urinary Tract: Adrenal glands are unremarkable. Kidneys are normal, without renal calculi, focal lesion, or hydronephrosis. Bladder is unremarkable. Stomach/Bowel: No bowel obstruction or ileus. There is segmental wall thickening and pericolonic fat stranding involving the hepatic flexure of the colon, extending approximately 8 cm in length. This inflamed colonic segment abuts the pancreatic pseudocyst described above, with loss of intervening fat plane. I cannot exclude underlying communication between the pseudocyst and the hepatic flexure of colon, best seen on image 56/6, image 39/7, and image 30/8. Further evaluation with colonoscopy may be useful to exclude underlying fistula. The appendix is surgically absent. Diverticulosis of the sigmoid colon without diverticulitis. Vascular/Lymphatic: Aortic atherosclerosis. No enlarged abdominal or pelvic lymph nodes. Reproductive: Prostate is unremarkable. Other: There is no free fluid or free intraperitoneal gas. No abdominal wall hernia. Musculoskeletal: No acute or destructive bony abnormalities. Reconstructed images demonstrate no additional findings. IMPRESSION: 1. Interval enlargement of the pancreatic pseudocyst seen on prior exam. No evidence of acute pancreatitis. 2. Since the prior study, there is interval development of segmental colonic wall thickening and pericolonic fat stranding at the hepatic  flexure of the colon, with loss of fat plane between the inflamed colon and pseudocyst. Several images suggest fistula formation between the inflamed colon and the pseudocyst, with high attenuation material within the inferior margin of the  pseudocyst possibly representing bowel contents. Further evaluation with colonoscopy is recommended. The appearance of the colon could reflect primary colitis versus secondary inflammatory involvement due to the adjacent pseudocyst and suspected fistula. 3. Sigmoid diverticulosis without diverticulitis. 4.  Aortic Atherosclerosis (ICD10-I70.0). Electronically Signed   By: Sharlet Salina M.D.   On: 12/06/2022 18:37     Scheduled Meds:  folic acid  1 mg Oral Daily   lisinopril  20 mg Oral Daily   multivitamin with minerals  1 tablet Oral Daily   pantoprazole  40 mg Oral Daily   thiamine  100 mg Oral Daily   Or   thiamine  100 mg Intravenous Daily   Continuous Infusions:  cefTRIAXone (ROCEPHIN)  IV Stopped (12/06/22 2300)   metronidazole Stopped (12/07/22 1006)     LOS: 0 days    Time spent:    Zannie Cove, MD [facility]   12/07/2022, 11:20 AM

## 2022-12-07 NOTE — Discharge Instructions (Signed)
Outpatient Programs  Narcotics Anonymous  24-HOUR HELPLINE Pre-recorded for Meeting Schedules  [facility] [phone]  [email or web address] ALCOHOLICS ANONYMOUS [facility] Answering Service [phone] Please Note: All [place] Meetings are Non-smoking TonerProviders.com.cy aanorthcarolina.Electrical engineer agencies for assistance with funding  [facility] for [facility]  [address] [place], [place], [place] [address]  Phone: [phone]  [employer]  [phone]- crisis line  [facility] (statewide facilities/programs) [address] [place] [address] (Medicaid/state funds) [place], [place] [address] [phone] Marcy Panning- [phone] [place]- [phone] Claris [phone]! [email or web address]  [facility]  Pharmacist, community (Medicaid/state funds)  Crisis line [phone]  [facility] [employer]  [phone]  [place] [phone] [address]  [phone] [facility] [place] (2 Locations) (Medicaid/state funds) --[address] walk in 8:30-12 and 1-2:30 [place], [id number] [facility]- [phone] --[address] [place], [place] [address] [phone] walk in 8:30-12 and 2-3:30  [facility]  Substance Use and Mental Health Crisis Center-alternative to  emergency rooms, jails or the streets.  OPEN [address] HOURS EVERY DAY  [address], [place], [place] [address] [phone]  [place]- [address] [place], [place] [address] [phone] (24 hours)  [place]- [address] [place] [phone]  [place]- [address] [place] [phone]  7 day detox [phone] and [facility] Care [facility] [address] [place], [place] [address] [phone] (24 hours) [place]- [address] [place], [place] [address] [phone] [facility]- [address] [place], [place] [address] [phone] (24 hours) [place] [address] [place], [place] [address] [phone] Turner Daniels- [address]  [place] [phone] [place]- [address] [place] Ace [phone] Medicaid and state funds  [facility] Drug Services -  Insurance: Medicaid /State funding/private insurance  Methadone, suboxone  [place] [phone] Fax: [phone]  [address] [place], [place], [address] [facility] [email or web address] Types of Program: Transitional housing, IOP, Outpatient treatment, [employer] funding/Medicaid Phone: [phone] Fax: [phone] Address: [address] [place] [address]  [facility], [place], most private  insurance providers  [address]  [place], [place] [address]  [phone] Augustina Mood Addiction and internal medicine clinic Private insurance/Medicare/private pay Outpatient/suboxone/general medicine [address] [place], [place] [address] [phone] (Suboxone)  Alcohol/drug Council of [place] Information and referral for programs for pregnant women [phone] [facility] Text [phone] Macario Golds 12pm -6pm  [facility]-SAIOP  [place]  [phone] [facility] [address] [place], [place] [address] [phone]- Substance abuse treatment  [facility]  Medication management and therapy, IOP/PHP  [address] [place], [place] [address]  [phone] (Medicaid/state funds- individual -private  insurance-group) [place]- State funds only MH/SA therapy and med management [address] [place], [place] [address] [phone] / [phone] Crisis Center: [phone]  Evelena Asa Prevention and Recovery  Inpt and outpt- insurance, Medicaid, self pay  [address]  [place], [place] [address]  [phone] [address] [place], [place] [address] [phone] M-F 8-5  Residential Programs  [facility] Substance Abuse Treatment Center  Insurance: Medicaid, some private  insurance  Types of Program: Medical Detox, Residential Treatment  for [facility] residents  Phone: [phone] Fax: [phone]  [address], [place], [place], [address] [facility] Margo Aye (SelfDetection.tn) Only private insurance providers or self-pay Types of Program: CDIOP, Extended Treatment, Family  Therapy, Group Counseling, Inpatient program, Partial Programming Phone: [phone] Fax: [phone] Address: [address] [place], [address]  [facility]  Insurance: Accepts those with and without insurance,  [facility] sponsorship  Type of Program: Residential Treatment  Phone: [phone]  Address: [address] [place], [place],  [address]MWUXL of [employer] (VeganReport.com.au) Insurance: [place] - based on those with income [phone]) and without ($285) Types of Program: 90-Day Program Phone: [phone] Fax: [phone] Address: [address], [place], [place], [address]  [facility] (http://rivera-kline.[email or web address]/)  Insurance: n/a  Types of Program: Residential Treatment  Phone: [phone] Fax: [phone]  Address: [address], [place], [place], [address] [facility] (http://[email or web address]) Insurance: n/a Types of Program: Residential Treatment, Substance Abuse Treatment for homeless women Phone: [phone] Fax: [phone] Address: [address], [place], [address]  [facility] (ages 1-40) (www.teenchallengeusa.com)  Insurance: n/a; $700 entrance fee, including $50 non-refundable  application fee  Types of Program: Residential Treatment  Phone: [phone] Fax: [phone]  Address: [address], [place], [place], [address]  (Statewide locations)[facility] [place] [facility] or [place] [phone] [employer] (FunnyTan.co.nz) Insurance: n/a; fees range from [phone] (rent) Types of Program: Sober living for those in recovery Phone:  [phone] [facility]) / [phone] (Will) / [phone] Tammy Sours)  [facility] (women) [address] [place], [place] [address] [phone] Valinda Hoar [phone] Work based (manual labor) Residential- minimum 2 years [phone] [address] [place], [place] [address]  [facility]  Insurance: n/a  9-12 month recovery- WOMEN and children  [address] [address]  [place], [place] [address] Phone: [phone] [facility] WOMEN 1 yr -[place] based- no meds [address] [place], [place] [address] [phone]  [facility] Drug Services -  Insurance: Medicaid /State funding/private insurance  Methadone, suboxone  [address]  [place], [place] [address] [phone] [place]- [address] Rosalita Levan [phone] 7 day detox  State funds, self-pay, some private insurance providers  Phone: [phone] Fax: [phone]  Address: [address] [place], [place], [place],  [address] [employer] n/a; work-based program for chronic relapse patients Types of Program: Residential Treatment Phone: [phone] Fax: [phone] Address: [address] [place], [place], [address]  [facility] (www.thefellowshiphome.org)  Insurance: n/a; $75-100/week  Types of Program: Individual/Group Therapy, 2-year Relapse  Prevention program  Phone: [phone] Fax: [phone]  Address: [address], [place], [place], [address] [facility] Insurance: n/a Types of Program: Residential Treatment, Housing program, Work Therapy Phone: [phone] Fax: [phone] Address: [address] [place], [place], [address]  [employer]: n/a  Types of Programs: Residential Treatment  Phone: [phone]  Address: [address], [place], [place], [address] [employer] - 6-12 months Cardinal/Medicaid/medicare/disability [address] (main office) [place], [place] [address] [phone]  [facility]  [facility] [place]  (http://[email or web address]/)  Insurance: n/a; clients must pay a cash fee of $3,220  upfront  Types of Program: Residential Treatment  Phone: [phone] Fax: [phone]  Address: [address], [place], [place], [address] [facility] Insurance: n/a; must be able to work on site Types of Program: Residential Treatment Phone: [phone] Carlynn Spry) / [phone] Mardella Layman) Address: [address] [place], [place], [address]  [facility]  [facility] - [employer]- women  (http://charlotterescuemission.org/)  Insurance: n/a  Types of Programs: Residential Treatment  Phone: [phone] Fax: [phone]  Address:  [address] [place], [place], [address] [facility] Insurance: Medicaid, all private insurance providers not in a network Types of Program: Drug Education, DUI Treatment, Methadone program, Residential Treatment, Substance Abuse Intensive Outpatient Phone: [phone] Fax: [phone] Address: [address], [place], [place], [address]  [facility] - Men  Insurance: n/a  Types of Program: Residential Treatment [facility]. Insurance: n/a; the fee for the first 90 days will be waived upon completion of the entire program.  Phone: [phone] Fax: [phone] Address: [address], [place], [place], [address] Types of Program: Men and Women's, Families Level, [facility] house for the homeless Phone: [phone] Fax: [phone] Address: [address] [place], [place], [address]  [place]  [facility] (ARanked.fi)  Insurance: n/a; [phone] for 30 days, afterwards clients are expected  to work onsite to maintain stay  Types of Program: Aftercare services, Day Program,  (partially funded)  Phone: [phone] Fax: [phone]  Address: [address] - [address] - [place], [place]  [address]  [facility] (http://www.flynnhickory.[email or web address]/)  Insurance: n/a; $250.00/first two weeks and $125/week afterwards Types of Program: Housing for clients who do not suffer from psychological or medical problems Phone: [phone] Fax: [phone] Address: [address], [place], [place], [address]  [facility]  (DrawPay.co.nz)  Insurance: n/a; $150 entry fee  Types of Program: Therapeutic Community  Phone: [phone] Fax: [phone]  Address: [address], [place], [place], [address] [place] [employer]: n/a Types of Program: Residential Treatment Phone: [phone] Fax: [phone] Address: [address], [place], [place], [address]  [facility]  Insurance: $300 entry fee-(some scholarships available)  Types of Program: Residential Treatment  Phone: [phone] Fax: [phone]  Address: [address], [place], [place], [address] [facility]) Insurance: n/a; sliding scale between $6,000-$12,000 depending on income Types of Program: Residential Treatment Phone: [phone] Fax: [phone] Address: [address]nd [address] Billie Lade [place], [place], [address]  [facility]) (women 18-39 yo) 1 year  Minimum $400 entrance fee  [address]  [place], [place] [address]  [phone]  Fax [phone]  [facility] insurance only  [address] [place], [place] [address]  Phone: [phone]  [place]  [facility] (RainTreatment.es)  Insurance: n/a  Types of Program: Emergency overnight stay, Medical Detox,  Recovery program for [facility] residents, 59-month  Residential Treatment  Phone: [phone] Fax: [phone]  Address: [address], [place], [place], [address] Insurance: n/a Types of Program: Residential Treatment Phone: [phone] Fax: [phone] Address: [address], [place], [address]  [employer], [place], [place] is  accepted but will  only pay for certain services  Types of Program: Inpatient and Outpatient Treatment  Phone: [phone] Fax: [phone]  Address: [address], [place], [place], [address]  Other Locations: [facility] [place] (Men Only) Insurance: n/a; $91/week Types of Program: Housing program for men who are employed full time (up to 6 months) Phone: [phone] Fax: [phone] Address: [address] [place], [place], [address]  [facility] [address], [place], [place], [place], [address] [facility] [address], [place], [place], [address]  [facility] (Men Only) -  program is free for 30 days,  afterwards residents may stay on  site but are required to have a job or be able to pay $55.00/week.  Types of Program: Meals, Shelter, Civil Service fast streamer, Pregnancy services  Phone: [phone] Fax: [phone]  Address: [address], [place], [address]  [facility] ([facility]) Detox/crisis, residential, [address] [place], [place] [address] Phone: [phone] State funding and financial assistance  [facility] women- 6 mos  $500 entry fee  [address]  [place], [place] [address]  [phone] [facility] MH/SA opt and residential [address], [place], [place] [address] Theron Arista [place], [place] [address] [phone]  Other/out of state  [facility] Treatment  Insurance: self pay or cardinal/partners  Types of Program: Residential Treatment  Phone: Men's Division [phone]  Women's Division [phone]  Address: [facility] [address], [place], [place],  [address] [employer] (http://owlsnestrecovery.com/) Insurance: n/a; fees range from [phone] Types of Program: Intensive Substance Abuse Rehabilitation Phone: [phone] Fax: [phone] Address: [address], [place], [place], [address]  [facility] (www.bridgetorecovery.org)  $2500.00  for 30 day program  Types of Program: Transitional Housing  Phone: [phone] Fax: [phone]  Address: [address] [place], [place], [address]  [employer] Insurance: n/a Types of Program: Residential Treatment (does not allow behavioral health medications) Phone: [phone] Fax: [phone] Address: [address] [place], [address]  [facility] (http://[email or web address])  Insurance: Public affairs consultant, private pay [phone] for 28 days)  Types of Program: Chronic Pain Recovery, DUI Treatment,  Family Therapy, Gender-Specific Therapy, Opiate Recovery,  Residential Treatment  Phone: [phone] Fax: [phone]  Address: [address] [place], [place], [address] [facility]) Medicaid, Medicare and private insurance providers; cost of $517.00/day Types of Program: 7-day Detoxification program and 14-day Rehabilitation program Phone: [phone]: [phone] Address: [address] [place], [place], [address]  [facility] [employer] locations  [employer] only  Page [phone]

## 2022-12-07 NOTE — Progress Notes (Signed)
Pt d/c, wife at bedside, refused wheelchair and insisted on walking with wife to the parking lot, paperwork given to pt. PIV d/c, pt did not want pressure dressing applied, pt given dressing and tape just in case he needs to apply.

## 2022-12-10 NOTE — Discharge Summary (Addendum)
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 -Reports that he quit alcohol for few weeks in April after his episode, however shortly thereafter resumed daily EtOH use. -Labs in the ED noted WBC of 13.2, hemoglobin 18, normal LFTs, lipase 93, CT noted enlargement of pancreatic pseudocyst, colon wall thickening with some concern for fistula formation -Gastroenterology was consulted and patient was admitted to [facility] overnight  Hospital Course:  Acute on chronic pancreatitis Enlarging pancreatic pseudocyst with suspected fistula formation between the colon and the pseudocyst -sp  EGD with EUS with aspiration of the pseudocyst in 07/2022.  Cytology was negative for malignancy then -Unfortunately continues to drink EtOH every day -Now with worsening pain, CT evidence of enlargement of pseudocyst with suspected fistula with colon, lipase 95 -treated with supportive care, bowel rest, Seen by GI this afternoon, he was feeling better, tolerating liquids and adamant to be  discharged, GI recommended low fat diet, ETOH cessation and FU with Gi Dr.Kiran in [place], urgent colonoscopy was not felt to be indicated at this time   Hypertensive urgency -Improved, continue lisinopril   Alcohol use disorder -counseled   Erosive gastropathy Continue Protonix.    Consultations: GI Dr.Perry  Discharge Exam: Vitals:   12/07/22 0900 12/07/22 1323  BP:  (!) 134/100  Pulse:  72  Resp: 12 17  Temp:  98.1 F (36.7 C)  SpO2:  99%   Chronically ill thinly built male sitting up in bed, AAOx3 HEENT: Face appears flushed, no JVD, no icterus CVS: S1-S2, regular rhythm Lungs: Clear bilaterally, poor air movement Abdomen: Soft, mild epigastric tenderness, no rigidity or rebound, nondistended Extremities: No edema  Psychiatry: Flat affect   Discharge Instructions   Discharge Instructions     Discharge instructions   Complete by: As directed    Low fat diet, liquids for 1-2days, advance to solids   Increase activity slowly   Complete by: As directed       Allergies as of 12/07/2022   No Known Allergies      Medication List     STOP taking these medications    ibuprofen 600 MG tablet Commonly known as: ADVIL       TAKE these medications    acetaminophen 500 MG tablet Commonly known as: TYLENOL Take 2 tablets (1,000 mg total) by mouth every 6 (six) hours as needed for mild pain.   lisinopril 20 MG tablet Commonly known as: ZESTRIL Take 1 tablet (20 mg total) by mouth daily.   oxyCODONE 5 MG immediate release tablet Commonly known as: Oxy IR/ROXICODONE Take 1 tablet (5 mg total) by mouth every 6 (six) hours  as needed for severe pain. What changed: when to take this   pantoprazole 40 MG tablet Commonly known as: Protonix Take 1 tablet (40 mg total) by mouth daily.       No Known Allergies    The results of significant diagnostics from this hospitalization (including imaging, microbiology, ancillary and laboratory) are listed  below for reference.    Significant Diagnostic Studies: CT ABDOMEN PELVIS W CONTRAST  Result Date: 12/06/2022 CLINICAL DATA:  Acute severe pancreatitis EXAM: CT ABDOMEN AND PELVIS WITH CONTRAST TECHNIQUE: Multidetector CT imaging of the abdomen and pelvis was performed using the standard protocol following bolus administration of intravenous contrast. RADIATION DOSE REDUCTION: This exam was performed according to the departmental dose-optimization program which includes automated exposure control, adjustment of the mA and/or kV according to patient size and/or use of iterative reconstruction technique. CONTRAST:  75mL OMNIPAQUE IOHEXOL 350 MG/ML SOLN COMPARISON:  09/29/2022 FINDINGS: Lower chest: Hypoventilatory changes at the lung bases. No acute pleural or parenchymal lung disease. Hepatobiliary: No focal liver abnormality is seen. No gallstones, gallbladder wall thickening, or biliary dilatation. Pancreas: There is a complex rim enhancing fluid collection along the ventral margin of the pancreatic head, measuring 3.5 x 3.1 cm in transverse direction and extending 7.5 cm in craniocaudal extent. There is hyperdense material within the inferior margin of this fluid collection, and findings are consistent with pancreatic pseudocyst. There are no acute inflammatory changes involving the pancreas. No pancreatic duct dilation. Spleen: Normal in size without focal abnormality. Adrenals/Urinary Tract: Adrenal glands are unremarkable. Kidneys are normal, without renal calculi, focal lesion, or hydronephrosis. Bladder is unremarkable. Stomach/Bowel: No bowel obstruction or ileus. There is segmental wall thickening and pericolonic fat stranding involving the hepatic flexure of the colon, extending approximately 8 cm in length. This inflamed colonic segment abuts the pancreatic pseudocyst described above, with loss of intervening fat plane. I cannot exclude underlying communication between the pseudocyst and the hepatic  flexure of colon, best seen on image 56/6, image 39/7, and image 30/8. Further evaluation with colonoscopy may be useful to exclude underlying fistula. The appendix is surgically absent. Diverticulosis of the sigmoid colon without diverticulitis. Vascular/Lymphatic: Aortic atherosclerosis. No enlarged abdominal or pelvic lymph nodes. Reproductive: Prostate is unremarkable. Other: There is no free fluid or free intraperitoneal gas. No abdominal wall hernia. Musculoskeletal: No acute or destructive bony abnormalities. Reconstructed images demonstrate no additional findings. IMPRESSION: 1. Interval enlargement of the pancreatic pseudocyst seen on prior exam. No evidence of acute pancreatitis. 2. Since the prior study, there is interval development of segmental colonic wall thickening and pericolonic fat stranding at the hepatic flexure of the colon, with loss of fat plane between the inflamed colon and pseudocyst. Several images suggest fistula formation between the inflamed colon and the pseudocyst, with high attenuation material within the inferior margin of the pseudocyst possibly representing bowel contents. Further evaluation with colonoscopy is recommended. The appearance of the colon could reflect primary colitis versus secondary inflammatory involvement due to the adjacent pseudocyst and suspected fistula. 3. Sigmoid diverticulosis without diverticulitis. 4.  Aortic Atherosclerosis (ICD10-I70.0). Electronically Signed   By: Sharlet Salina M.D.   On: 12/06/2022 18:37   DG Hand Complete Right  Result Date: 11/24/2022 CLINICAL DATA:  Right hand pain centered about the third MCP joint. EXAM: RIGHT HAND - COMPLETE 3+ VIEW COMPARISON:  None Available. FINDINGS: No acute bony or joint abnormality is identified. Irregular appearance of the head of the proximal phalanx of the index finger is most consistent with  remote healed fracture. Remote healed fracture of the distal diaphysis of the proximal phalanx of the  ring finger is also identified. There is also likely a remote healed fracture of the fifth metacarpal. Joint spaces are maintained. No erosion, osteophytosis or periostitis. Soft tissues are negative. No chondrocalcinosis. IMPRESSION: No acute abnormality or evidence of arthropathy. Findings most consistent with remote healed fractures of the proximal phalanges of the index and ring fingers and neck of the fifth metacarpal. Electronically Signed   By: Drusilla Kanner M.D.   On: 11/24/2022 12:31    Microbiology: Recent Results (from the past 240 hour(s))  Culture, blood (Routine X 2) w Reflex to ID Panel     Status: None (Preliminary result)   Collection Time: 12/06/22 11:56 PM   Specimen: BLOOD LEFT ARM  Result Value Ref Range Status   Specimen Description BLOOD LEFT ARM  Final   Special Requests   Final    BOTTLES DRAWN AEROBIC AND ANAEROBIC Blood Culture adequate volume   Culture   Final    NO GROWTH 3 DAYS Performed at [facility], [address], [place], [place] [address]    Report Status PENDING  Incomplete  Culture, blood (Routine X 2) w Reflex to ID Panel     Status: None (Preliminary result)   Collection Time: 12/06/22 11:59 PM   Specimen: BLOOD RIGHT WRIST  Result Value Ref Range Status   Specimen Description BLOOD RIGHT WRIST  Final   Special Requests   Final    BOTTLES DRAWN AEROBIC AND ANAEROBIC Blood Culture adequate volume   Culture   Final    NO GROWTH 3 DAYS Performed at [facility], [address], [place], [place] [address]    Report Status PENDING  Incomplete     Labs: Basic Metabolic Panel: Recent Labs  Lab 12/06/22 1506 12/07/22 0245  NA 138 134*  K 3.5 4.2  CL 99 104  CO2 22 19*  GLUCOSE 121* 93  BUN 9 6  CREATININE 0.83 0.65  CALCIUM 8.9 8.2*  MG  --  1.8   Liver Function Tests: Recent Labs  Lab 12/06/22 1506  AST 19  ALT 10  ALKPHOS 73  BILITOT 0.8  PROT 6.7  ALBUMIN 3.9   Recent Labs  Lab 12/06/22 1506  LIPASE 93*    No results for input(s): "AMMONIA" in the last 168 hours. CBC: Recent Labs  Lab 12/06/22 1506 12/07/22 0245  WBC 13.2* 10.6*  HGB 18.3* 16.8  HCT 55.5* 50.5  MCV 94.7 95.5  PLT 237 227   Cardiac Enzymes: No results for input(s): "CKTOTAL", "CKMB", "CKMBINDEX", "TROPONINI" in the last 168 hours. BNP: BNP (last 3 results) No results for input(s): "BNP" in the last 8760 hours.  ProBNP (last 3 results) No results for input(s): "PROBNP" in the last 8760 hours.  CBG: No results for input(s): "GLUCAP" in the last 168 hours.     Signed:  Zannie Cove MD.  [facility] 12/10/2022, 1:14 PM

## 2022-12-12 LAB — CULTURE, BLOOD (ROUTINE X 2)
Culture: NO GROWTH
Culture: NO GROWTH
Special Requests: ADEQUATE
Special Requests: ADEQUATE

## 2022-12-15 NOTE — Progress Notes (Unsigned)
Established Patient Office Visit  Subjective    Patient ID: Isaac Meza, male    DOB: 05/02/1970  Age: 52 y.o. MRN: [id number]  CC:  No chief complaint on file.   HPI Isaac Meza presents for hospital follow up.  Discharge Date: 12/07/22 Diagnosis: Alcoholic pancreatitis, pancreatitic pseudocyst Procedures/tests: CT A/P with enlargement of the pancreatitic pseudocyst without acute pancreatitis, segmental colonic wall thickening and pericolonic fat stranding at the hepatic flexure with potential fistula formation Consultants: GI New medications: None Discontinued medications: Ibuprofen  Discharge instructions:  Follow up with GI in 2-3 weeks, colonoscopy ? Status: {Blank multiple:19196::"better","worse","stable","fluctuating"}   Pancreatic pseudocyst and chronic pancreatitis: -CT of the abdomen showed chronic pancreatic pseudocyst with fluid collection adjacent to the pancreatic head and duodenum.   -EGD with EUS with aspiration of pseudocyst performed on 4/26 with erosive gastropathy noted.  -Currently on Protonix 40 mg -Following with GI, last seen 09/29/22 but no-showed an appointment in July, patient states he will call to make another follow up -Decreased alcohol intake but still drinking one mixed drink daily   Hypertension: -Medications: Lisinopril 20 mg -Previous Meds: HCTZ 12.5 mg discontinued while inpatient  -Checking BP at home (average): not checking currently  -Denies any SOB, CP, vision changes, LE edema or symptoms of hypotension -Family history of father with MI in 32's, uncle in 56's and brother in upper 93's   HLD: -Medications: Nothing -Last lipid panel: Lipid Panel     Component Value Date/Time   CHOL 168 08/14/2022 0958   TRIG 65 08/14/2022 0958   HDL 40 08/14/2022 0958   CHOLHDL 2.8 04/30/2022 1116   LDLCALC 115 (H) 08/14/2022 0958   LDLCALC 93 04/30/2022 1116   LABVLDL 13 08/14/2022 0958    The 10-year ASCVD risk score (Arnett DK, et  al., 2019) is: 9.6%   Values used to calculate the score:     Age: 40 years     Sex: Male     Is Non-Hispanic African American: No     Diabetic: No     Tobacco smoker: Yes     Systolic Blood Pressure: 134 mmHg     Is BP treated: Yes     HDL Cholesterol: 40 mg/dL     Total Cholesterol: 168 mg/dL  Asthma, childhood:  -Asthma status: controlled -Current Treatments: Nothing, hasn't been on anything since childhood  -Dyspnea frequency: None -Wheezing frequency: None -Cough frequency: None  -Nocturnal symptom frequency: None  -Limitation of activity: no -Visits to ER or Urgent Care in past year: no  Health Maintenance: -Blood work UTD -Colon cancer screening: Cologuard negative 2/24  Outpatient Encounter Medications as of 12/16/2022  Medication Sig   acetaminophen (TYLENOL) 500 MG tablet Take 2 tablets (1,000 mg total) by mouth every 6 (six) hours as needed for mild pain.   lisinopril (ZESTRIL) 20 MG tablet Take 1 tablet (20 mg total) by mouth daily.   oxyCODONE (OXY IR/ROXICODONE) 5 MG immediate release tablet Take 1 tablet (5 mg total) by mouth every 6 (six) hours as needed for severe pain.   pantoprazole (PROTONIX) 40 MG tablet Take 1 tablet (40 mg total) by mouth daily.   No facility-administered encounter medications on file as of 12/16/2022.    Past Medical History:  Diagnosis Date   Asthma    childhood   Hypertension     Past Surgical History:  Procedure Laterality Date   BACK SURGERY     ESOPHAGOGASTRODUODENOSCOPY (EGD) WITH PROPOFOL N/A 08/01/2022   Procedure: ESOPHAGOGASTRODUODENOSCOPY (EGD)  WITH PROPOFOL;  Surgeon: Jeani Hawking, MD;  Location: [facility] ENDOSCOPY;  Service: Gastroenterology;  Laterality: N/A;   EUS N/A 08/01/2022   Procedure: UPPER ENDOSCOPIC ULTRASOUND (EUS) LINEAR;  Surgeon: Jeani Hawking, MD;  Location: [facility] ENDOSCOPY;  Service: Gastroenterology;  Laterality: N/A;   FINE NEEDLE ASPIRATION  08/01/2022   Procedure: FINE NEEDLE ASPIRATION (FNA) LINEAR;   Surgeon: Jeani Hawking, MD;  Location: [facility] ENDOSCOPY;  Service: Gastroenterology;;   FRACTURE SURGERY Right 2007   arm   XI ROBOTIC LAPAROSCOPIC ASSISTED APPENDECTOMY N/A 05/16/2022   Procedure: XI ROBOTIC LAPAROSCOPIC ASSISTED APPENDECTOMY;  Surgeon: Henrene Dodge, MD;  Location: [facility] ORS;  Service: General;  Laterality: N/A;    No family history on file.  Social History   Socioeconomic History   Marital status: Single    Spouse name: Not on file   Number of children: Not on file   Years of education: Not on file   Highest education level: Not on file  Occupational History   Not on file  Tobacco Use   Smoking status: Every Day    Current packs/day: 0.50    Types: Cigarettes   Smokeless tobacco: Never  Vaping Use   Vaping status: Never Used  Substance and Sexual Activity   Alcohol use: Yes    Alcohol/week: 21.0 standard drinks of alcohol    Types: 21 Shots of liquor per week    Comment: 3 shots of liquor everyday   Drug use: Not Currently   Sexual activity: Yes  Other Topics Concern   Not on file  Social History Narrative   Not on file   Social Determinants of Health   Financial Resource Strain: Not on file  Food Insecurity: No Food Insecurity (12/06/2022)   Hunger Vital Sign    Worried About Running Out of Food in the Last Year: Never true    Ran Out of Food in the Last Year: Never true  Transportation Needs: No Transportation Needs (12/06/2022)   PRAPARE - Administrator, Civil Service (Medical): No    Lack of Transportation (Non-Medical): No  Physical Activity: Not on file  Stress: Not on file  Social Connections: Not on file  Intimate Partner Violence: Not At Risk (12/06/2022)   Humiliation, Afraid, Rape, and Kick questionnaire    Fear of Current or Ex-Partner: No    Emotionally Abused: No    Physically Abused: No    Sexually Abused: No    Review of Systems  Constitutional:  Negative for chills and fever.  Eyes:  Negative for blurred vision.   Respiratory:  Negative for cough, shortness of breath and wheezing.   Cardiovascular:  Negative for chest pain.  Gastrointestinal:  Positive for abdominal pain. Negative for nausea and vomiting.  Neurological:  Negative for headaches.        Objective    There were no vitals taken for this visit.  Physical Exam Constitutional:      Appearance: Normal appearance.  HENT:     Head: Normocephalic and atraumatic.  Eyes:     Conjunctiva/sclera: Conjunctivae normal.  Cardiovascular:     Rate and Rhythm: Normal rate and regular rhythm.  Pulmonary:     Effort: Pulmonary effort is normal.     Breath sounds: Normal breath sounds.  Musculoskeletal:        General: Swelling and tenderness present.     Comments: Swelling and inflammation over right third MCP, good ROM  Skin:    General: Skin is warm and dry.  Neurological:     General: No focal deficit present.     Mental Status: He is alert. Mental status is at baseline.  Psychiatric:        Mood and Affect: Mood normal.        Behavior: Behavior normal.         Assessment & Plan:   1. Hypertension, unspecified type: Blood pressure well controlled despite no longer being on hydrochlorothiazide, continue Lisinopril 20 mg, refilled.  - lisinopril (ZESTRIL) 20 MG tablet; Take 1 tablet (20 mg total) by mouth daily.  Dispense: 90 tablet; Refill: 1  2. Alcohol-induced chronic pancreatitis [facility]): Still actively drinking despite knowing risks of this. Patient not interested in complete sobriety, despite having epigastric pain again last week that improved on its own. Patient will call GI to make follow up appointment, taking Protonix 40 mg will refill today.   - pantoprazole (PROTONIX) 40 MG tablet; Take 1 tablet (40 mg total) by mouth daily.  Dispense: 90 tablet; Refill: 1  3. Right hand pain: Send for x-ray today.   - DG Hand Complete Right; Future   No follow-ups on file.   Margarita Mail, DO

## 2022-12-16 ENCOUNTER — Ambulatory Visit: Payer: Medicaid Other | Admitting: Internal Medicine

## 2022-12-16 ENCOUNTER — Encounter: Payer: Self-pay | Admitting: Internal Medicine

## 2022-12-16 VITALS — BP 160/92 | HR 92 | Temp 97.5°F | Resp 18 | Ht 68.0 in | Wt 119.7 lb

## 2022-12-16 DIAGNOSIS — K860 Alcohol-induced chronic pancreatitis: Secondary | ICD-10-CM

## 2022-12-16 DIAGNOSIS — K863 Pseudocyst of pancreas: Secondary | ICD-10-CM

## 2022-12-16 DIAGNOSIS — Z09 Encounter for follow-up examination after completed treatment for conditions other than malignant neoplasm: Secondary | ICD-10-CM | POA: Diagnosis not present

## 2022-12-16 DIAGNOSIS — I10 Essential (primary) hypertension: Secondary | ICD-10-CM | POA: Diagnosis not present

## 2022-12-19 NOTE — Addendum Note (Signed)
Addended by: Margarita Mail on: 12/19/2022 02:02 PM   Modules accepted: Level of Service

## 2023-01-22 ENCOUNTER — Ambulatory Visit: Payer: Medicaid Other | Admitting: Gastroenterology

## 2023-01-22 ENCOUNTER — Encounter: Payer: Self-pay | Admitting: Gastroenterology

## 2023-01-22 VITALS — BP 170/107 | HR 76 | Temp 98.7°F | Ht 68.0 in | Wt 124.0 lb

## 2023-01-22 DIAGNOSIS — R933 Abnormal findings on diagnostic imaging of other parts of digestive tract: Secondary | ICD-10-CM | POA: Diagnosis not present

## 2023-01-22 DIAGNOSIS — K852 Alcohol induced acute pancreatitis without necrosis or infection: Secondary | ICD-10-CM

## 2023-01-22 DIAGNOSIS — Z8719 Personal history of other diseases of the digestive system: Secondary | ICD-10-CM | POA: Diagnosis not present

## 2023-01-22 DIAGNOSIS — K859 Acute pancreatitis without necrosis or infection, unspecified: Secondary | ICD-10-CM

## 2023-01-22 NOTE — Progress Notes (Signed)
Isaac Mood MD, MRCP(U.K) [address]  [address]  [place], [place] [address]  Main: [phone]  Fax: [phone]   Primary Care Physician: Isaac Mail, DO  Primary Gastroenterologist:  Dr. Wyline Meza   Chief Complaint  Patient presents with   Anemia   alcohol induced acute pancreatitis    HPI: Isaac Meza is a 52 y.o. male was initially seen at our office in May 2024 by Isaac Meza.  History of appendectomy for appendicitis in February 2024.  Admitted to the hospital in March 2024 for acute pancreatitis.  Return in April 2024 for pancreatitis CT scan of the abdomen showed chronic pancreatic pseudocyst with fluid collection transferred to [facility] underwent EUS and aspiration of the cystic lesion by Dr. Jacques Meza no malignancy seen.  Erosive gastropathy noted on EGD.  In May 2024 he felt better.  Very likely the episodes of pancreatitis were due to excess alcohol consumption.  In May 2024 when he was seen at the outpatient he continued to drink alcohol although had cut down.  He appeared malnourished.  HCTZ has been discontinued in the past.   Interval history  10/03/2022: CT scan of the abdomen and pelvis with contrast showed interval decrease in the size of the pseudocyst inferior to the pancreatic head and duodenum probable internal septations and surrounding thick-walled appearance consistent with a chronic pseudocyst component of possible acute pancreatitis also noted.  4 mm nodule in the right middle lobe noted.  12/06/2022 CT scan of the abdomen pelvis with contrast showed interval enlargement of the pancreatic pseudocyst seen on prior exam segmental colonic wall thickening with pericolonic fat stranding at the hepatic flexure of the colon with loss of fat plane between the inflamed colon and pseudocyst suggestive of fistula formation.  The pseudocyst of 3.5 x 3.1 cm extending 7.5 cm in the craniocaudal extent  He has been doing well overall.  He has been gaining  weight.  He has stopped drinking all alcohol.  Denies any NSAID use.  Regular bowel movements.  No nausea no vomiting.  Eating well.  Appetite good.  Occasional right sided discomfort but not significant.  No other complaints.   Current Outpatient Medications  Medication Sig Dispense Refill   lisinopril (ZESTRIL) 20 MG tablet Take 1 tablet (20 mg total) by mouth daily. 90 tablet 1   oxyCODONE (OXY IR/ROXICODONE) 5 MG immediate release tablet Take 1 tablet (5 mg total) by mouth every 6 (six) hours as needed for severe pain. 20 tablet 0   pantoprazole (PROTONIX) 40 MG tablet Take 1 tablet (40 mg total) by mouth daily. 90 tablet 1   acetaminophen (TYLENOL) 500 MG tablet Take 2 tablets (1,000 mg total) by mouth every 6 (six) hours as needed for mild pain. (Patient not taking: Reported on 01/22/2023)     No current facility-administered medications for this visit.    Allergies as of 01/22/2023   (No Known Allergies)      ROS:  General: Negative for anorexia, weight loss, fever, chills, fatigue, weakness. ENT: Negative for hoarseness, difficulty swallowing , nasal congestion. CV: Negative for chest pain, angina, palpitations, dyspnea on exertion, peripheral edema.  Respiratory: Negative for dyspnea at rest, dyspnea on exertion, cough, sputum, wheezing.  GI: See history of present illness. GU:  Negative for dysuria, hematuria, urinary incontinence, urinary frequency, nocturnal urination.  Endo: Negative for unusual weight change.    Physical Examination:   BP (!) 170/107   Pulse 76   Temp 98.7 F (37.1  C) (Oral)   Ht 5\' 8"  (1.727 m)   Wt 124 lb (56.2 kg)   BMI 18.85 kg/m   General: Well-nourished, well-developed in no acute distress.  Eyes: No icterus. Conjunctivae pink. Mouth: Oropharyngeal mucosa moist and pink , no lesions erythema or exudate. Lungs: Clear to auscultation bilaterally. Non-labored. Heart: Regular rate and rhythm, no murmurs rubs or gallops.  Abdomen: Bowel  sounds are normal, nontender, nondistended, no hepatosplenomegaly or masses, no abdominal bruits or hernia , no rebound or guarding.   Extremities: No lower extremity edema. No clubbing or deformities. Neuro: Alert and oriented x 3.  Grossly intact. Skin: Warm and dry, no jaundice.   Psych: Alert and cooperative, normal Meza and affect.   Imaging Studies: No results found.  Assessment and Plan:   Isaac Meza is a 52 y.o. y/o male history of multiple episodes of pancreatitis complicated by pseudocyst earlier in the year he underwent EUS and FNA which showed no malignancy.  Recent imaging when admitted to the hospital showed features of a large pseudocyst with a rim.  That was on 12/06/2022.  There is also concern for fistula between the pseudocyst and the colon.  Very likely based on history that the is due to alcohol triglycerides in May 2024 were not elevated.  Clinically he does not seem to have a significant pancreatic pseudocyst at this point of time.  Abdominal exam is benign.  Plan 1.  Stop all alcohol use.  Advised that he drinks alcohol it will kill him as he has had severe pancreatitis in the past. 2.  MRI MRCP of the pancreas in 4 to 6 weeks time to determine the status of these pseudocysts.  If increasing in size may need intervention if decreasing in size may just require conservative management.  Once we know the status of the cysts we can determine about obtaining a colonoscopy since there was some concern about an fistula which can be better visualized with an MRI.  It will also help rule out conditions such as pancreatic divisum.      Dr Isaac Mood  MD,MRCP [facility]) Follow up in 8 weeks with Isaac Meza

## 2023-01-22 NOTE — Patient Instructions (Signed)
Please do not eat or drink 4 hours prior to your MRI. If the date and time does not work for you, please call [phone] to reschedule.

## 2023-01-29 ENCOUNTER — Ambulatory Visit: Admission: RE | Admit: 2023-01-29 | Payer: Medicaid Other | Source: Ambulatory Visit

## 2023-02-04 ENCOUNTER — Telehealth: Payer: Self-pay

## 2023-02-04 NOTE — Telephone Encounter (Addendum)
Called patient and asked him if he was interested in rescheduling his MRI and he stated that he totally forgot. I told him not to worry and that it happens to everybody. I then asked him if I could provide him wit the number to reschedule it and he stated that he was. I then gave him the number to call [phone] option 3 and 2). He stated that he would call as son as we hung up.

## 2023-02-04 NOTE — Telephone Encounter (Signed)
Patient No showed his MRI that was schedule for 01/29/2023. Please call patient if you want patient to reschedule

## 2023-02-06 ENCOUNTER — Other Ambulatory Visit: Payer: Self-pay | Admitting: Gastroenterology

## 2023-02-06 ENCOUNTER — Ambulatory Visit
Admission: RE | Admit: 2023-02-06 | Discharge: 2023-02-06 | Disposition: A | Discharge status: Home / Self Care | Payer: Medicaid Other | Source: Ambulatory Visit | Attending: Gastroenterology | Admitting: Gastroenterology

## 2023-02-06 DIAGNOSIS — Z8719 Personal history of other diseases of the digestive system: Secondary | ICD-10-CM | POA: Diagnosis not present

## 2023-02-06 DIAGNOSIS — K863 Pseudocyst of pancreas: Secondary | ICD-10-CM | POA: Diagnosis not present

## 2023-02-06 DIAGNOSIS — K859 Acute pancreatitis without necrosis or infection, unspecified: Secondary | ICD-10-CM | POA: Insufficient documentation

## 2023-02-06 DIAGNOSIS — I700 Atherosclerosis of aorta: Secondary | ICD-10-CM | POA: Diagnosis not present

## 2023-02-06 MED ORDER — GADOBUTROL 1 MMOL/ML IV SOLN
5.0000 mL | Freq: Once | INTRAVENOUS | Status: AC | PRN
  Administered 2023-02-06: 5 mL via INTRAVENOUS

## 2023-02-27 ENCOUNTER — Telehealth: Payer: Self-pay

## 2023-02-27 NOTE — Telephone Encounter (Signed)
-----   Message from Wyline Mood sent at 02/26/2023  1:26 PM EST ----- Informed that the pancreatic pseudocyst looks like it is at least stable and probably mildly decreased in size.  No further intervention right now.  Follow-up in the office as scheduled on 03/24/2023

## 2023-02-27 NOTE — Telephone Encounter (Signed)
Called patient to let him know the below information and he was happy to hear. Reminded patient of his upcoming appointment and to keep it. Patient stated that he would come to his appointment.

## 2023-03-23 NOTE — Progress Notes (Signed)
Celso Amy, PA-C [address]  [address]  [place], [place] [address]  Main: [phone]  Fax: [phone]   Primary Care Physician: Margarita Mail, DO  Primary Gastroenterologist:  Celso Amy, PA-C / Dr. Wyline Mood    CC: Follow-up alcoholic pancreatitis with pseudocyst  HPI: Isaac Meza is a 52 y.o. male returns for 33-month follow-up of alcoholic pancreatitis with pseudocyst, originally diagnosed March 2024.  Last drink of alcohol was a few weeks ago.  He drank 1 beer.  He has greatly decreased alcohol consumption.  No longer drinking liquor.  Rarely drinks beer.  He is feeling a lot better.  He has not any episodes of upper abdominal pain, nausea, or vomiting in several months.  No abdominal pain today.  He denies diarrhea, constipation, or rectal bleeding.  06/2022 CT: Acute pancreatitis with no cyst 07/2022 CT: Acute pancreatitis with new 3.9 x 4.0 x 5.7 cm pancreatic pseudocyst.  07/2022 underwent EUS with aspiration of pancreatic pseudocyst by Dr. Elnoria Howard in [place].  No malignancy.  HCTZ was discontinued.  Etiology of pancreatitis was thought due to alcohol.  08/2022 lipid panel: Triglycerides 65, total cholesterol 168.  Hemoglobin 14.4.  09/2022 CT abdomen pelvis with contrast: Decrease in size of pseudocyst.  Measuring 3.3 x 2.4 x 4.0 cm.  11/2022 CT abdomen pelvis with contrast: Enlargement of pseudocyst (3.5 x 3.1 cm) with possible fistula.  02/2023 abdominal MRI/MRCP with and without contrast: Slightly decreased size of pancreatic pseudocyst 2.9 x 2.7 cm.  Mild adjacent inflammatory fat stranding, decreased compared to prior exam.  Consistent with mild residual or recurrent pancreatitis.  No fistula.  No liver lesions.  No gallstones.  05/2022: Screening Cologuard test was negative.  Repeat Cologuard in 3 years.  Current Outpatient Medications  Medication Sig Dispense Refill   acetaminophen (TYLENOL) 500 MG tablet Take 2 tablets (1,000 mg total) by mouth  every 6 (six) hours as needed for mild pain.     lisinopril (ZESTRIL) 20 MG tablet Take 1 tablet (20 mg total) by mouth daily. 90 tablet 1   oxyCODONE (OXY IR/ROXICODONE) 5 MG immediate release tablet Take 1 tablet (5 mg total) by mouth every 6 (six) hours as needed for severe pain. 20 tablet 0   pantoprazole (PROTONIX) 40 MG tablet Take 1 tablet (40 mg total) by mouth daily. 90 tablet 1   No current facility-administered medications for this visit.    Allergies as of 03/24/2023   (No Known Allergies)    Past Medical History:  Diagnosis Date   Asthma    childhood   Hypertension     Past Surgical History:  Procedure Laterality Date   BACK SURGERY     ESOPHAGOGASTRODUODENOSCOPY (EGD) WITH PROPOFOL N/A 08/01/2022   Procedure: ESOPHAGOGASTRODUODENOSCOPY (EGD) WITH PROPOFOL;  Surgeon: Jeani Hawking, MD;  Location: [facility] ENDOSCOPY;  Service: Gastroenterology;  Laterality: N/A;   EUS N/A 08/01/2022   Procedure: UPPER ENDOSCOPIC ULTRASOUND (EUS) LINEAR;  Surgeon: Jeani Hawking, MD;  Location: [facility] ENDOSCOPY;  Service: Gastroenterology;  Laterality: N/A;   FINE NEEDLE ASPIRATION  08/01/2022   Procedure: FINE NEEDLE ASPIRATION (FNA) LINEAR;  Surgeon: Jeani Hawking, MD;  Location: [facility] ENDOSCOPY;  Service: Gastroenterology;;   FRACTURE SURGERY Right 2007   arm   XI ROBOTIC LAPAROSCOPIC ASSISTED APPENDECTOMY N/A 05/16/2022   Procedure: XI ROBOTIC LAPAROSCOPIC ASSISTED APPENDECTOMY;  Surgeon: Henrene Dodge, MD;  Location: [facility] ORS;  Service: General;  Laterality: N/A;    Review of Systems:    All systems reviewed and  negative except where noted in HPI.   Physical Examination:   BP (!) 177/106   Pulse 94   Temp 97.8 F (36.6 C)   Ht 5\' 8"  (1.727 m)   Wt 132 lb 9.6 oz (60.1 kg)   BMI 20.16 kg/m   General: Well-nourished, well-developed in no acute distress.  Lungs: Clear to auscultation bilaterally. Non-labored. Heart: Regular rate and rhythm, no murmurs rubs or gallops.  Abdomen: Bowel  sounds are normal; Abdomen is Soft; No hepatosplenomegaly, masses or hernias;  No Abdominal Tenderness; No guarding or rebound tenderness. Neuro: Alert and oriented x 3.  Grossly intact.  Psych: Alert and cooperative, normal mood and affect.   Imaging Studies: No results found.  Assessment and Plan:   Isaac Meza is a 52 y.o. y/o male returns for follow-up of alcoholic pancreatitis with pseudocyst originally diagnosed 06/2022.  EUS with FNA showed no malignancy.  Normal triglycerides.  Pseudocyst is decreasing based on recent MRI with no evidence of fistula.  No evidence of pancreas divisum.  1.  Chronic Alcoholic pancreatitis with pseudocyst, decreasing in size.  Continue to abstain from all alcohol.  Repeat abdominal pancreas MRI/MRCP with and without contrast in 3 months.  Continue conservative management.  2.  GERD with esophagitis  Continue pantoprazole 40 Mg daily  Recommend Lifestyle Modifications to prevent Acid Reflux.  Rec. Avoid coffee, sodas, peppermint, garlic, onions, alcohol, citrus fruits, chocolate, tomatoes, fatty and spicey foods.  Avoid eating 2-3 hours before bedtime.    3.  Colon cancer screening  Negative Cologuard test 05/2022.  Repeat screening Cologuard or screening colonoscopy in 3 years.  Colon cancer screening guidelines discussed.  Celso Amy, PA-C  Repeat pancreas MRI in 3 months.  Follow up office visit in 4 months.

## 2023-03-24 ENCOUNTER — Ambulatory Visit: Payer: Medicaid Other | Admitting: Physician Assistant

## 2023-03-24 ENCOUNTER — Encounter: Payer: Self-pay | Admitting: Physician Assistant

## 2023-03-24 VITALS — BP 177/106 | HR 94 | Temp 97.8°F | Ht 68.0 in | Wt 132.6 lb

## 2023-03-24 DIAGNOSIS — Z8719 Personal history of other diseases of the digestive system: Secondary | ICD-10-CM

## 2023-03-24 DIAGNOSIS — K863 Pseudocyst of pancreas: Secondary | ICD-10-CM | POA: Diagnosis not present

## 2023-03-24 DIAGNOSIS — K8681 Exocrine pancreatic insufficiency: Secondary | ICD-10-CM

## 2023-03-24 DIAGNOSIS — F1090 Alcohol use, unspecified, uncomplicated: Secondary | ICD-10-CM | POA: Diagnosis not present

## 2023-03-24 DIAGNOSIS — K860 Alcohol-induced chronic pancreatitis: Secondary | ICD-10-CM | POA: Diagnosis not present

## 2023-03-24 DIAGNOSIS — K2100 Gastro-esophageal reflux disease with esophagitis, without bleeding: Secondary | ICD-10-CM | POA: Diagnosis not present

## 2023-05-21 ENCOUNTER — Other Ambulatory Visit: Payer: Self-pay

## 2023-05-21 ENCOUNTER — Ambulatory Visit (INDEPENDENT_AMBULATORY_CARE_PROVIDER_SITE_OTHER): Payer: Medicaid Other | Admitting: Internal Medicine

## 2023-05-21 ENCOUNTER — Encounter: Payer: Self-pay | Admitting: Internal Medicine

## 2023-05-21 VITALS — BP 124/76 | HR 94 | Temp 98.5°F | Resp 16 | Ht 68.0 in | Wt 135.0 lb

## 2023-05-21 DIAGNOSIS — S43012S Anterior subluxation of left humerus, sequela: Secondary | ICD-10-CM | POA: Diagnosis not present

## 2023-05-21 DIAGNOSIS — I10 Essential (primary) hypertension: Secondary | ICD-10-CM | POA: Diagnosis not present

## 2023-05-21 DIAGNOSIS — K863 Pseudocyst of pancreas: Secondary | ICD-10-CM | POA: Diagnosis not present

## 2023-05-21 MED ORDER — LISINOPRIL 20 MG PO TABS
20.0000 mg | ORAL_TABLET | Freq: Every day | ORAL | 1 refills | Status: DC
Start: 2023-05-21 — End: 2023-11-24

## 2023-05-21 NOTE — Progress Notes (Signed)
Established Patient Office Visit  Subjective   Patient ID: Isaac Meza, male    DOB: 21-Jun-1970  Age: 53 y.o. MRN: [id number]  Chief Complaint  Patient presents with   Medical Management of Chronic Issues    6 month recheck    HPI  Isaac Meza presents for follow up on chronic medical conditions. Patient states he is having some left shoulder pain and frequent subluxations. He had a traumatic dislocation of the same shoulder 13 years ago but now has is sliding in and out of place almost daily, causing pain. No weakness or changes in ROM.   Pancreatic pseudocyst and chronic pancreatitis: -CT of the abdomen showed chronic pancreatic pseudocyst with fluid collection adjacent to the pancreatic head and duodenum.   -EGD with EUS with aspiration of pseudocyst performed on 4/26 with erosive gastropathy noted.  -Currently on Protonix 40 mg -Following with GI, last seen 03/24/23 -MRCP 11/24 showing a decrease in size of pseudocyst, supposed to repeat in 3 months, patient will call to schedule -Not taking Protonix currently  Hypertension: -Medications: Lisinopril 20 mg -Previous Meds: HCTZ 12.5 mg discontinued while inpatient  -Checking BP at home (average): not checking currently  -Denies any SOB, CP, vision changes, LE edema or symptoms of hypotension -Family history of father with MI in 70's, uncle in 75's and brother in upper 46's   HLD: -Medications: Nothing -Last lipid panel: Lipid Panel     Component Value Date/Time   CHOL 168 08/14/2022 0958   TRIG 65 08/14/2022 0958   HDL 40 08/14/2022 0958   CHOLHDL 2.8 04/30/2022 1116   LDLCALC 115 (H) 08/14/2022 0958   LDLCALC 93 04/30/2022 1116   LABVLDL 13 08/14/2022 0958   Asthma, childhood:  -Asthma status: controlled -Current Treatments: Nothing, hasn't been on anything since childhood  -Dyspnea frequency: None -Wheezing frequency: None -Cough frequency: None  -Nocturnal symptom frequency: None  -Limitation of  activity: no -Visits to ER or Urgent Care in past year: no  Health Maintenance: -Blood work UTD -Colon cancer screening: Cologuard negative 2/24  Patient Active Problem List   Diagnosis Date Noted   Epigastric abdominal pain 12/06/2022   Hypertensive urgency 12/06/2022   Alcohol use disorder 12/06/2022   Erosive gastropathy 12/06/2022   Erythrocytosis 12/06/2022   Protein-calorie malnutrition, severe 08/01/2022   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   Abnormal CT of the abdomen 07/30/2022   Pancreatic cyst 07/30/2022   Nausea and vomiting 07/30/2022   Chronic pancreatitis (HCC) 07/29/2022   Dehydration 07/29/2022   Duodenal obstruction 07/27/2022   Acute appendicitis 05/16/2022   Hypertension 04/30/2022   Long term current use of opiate analgesic 09/12/2020   Bilateral thoracic back pain 10/13/2017   Past Medical History:  Diagnosis Date   Asthma    childhood   Hypertension    Past Surgical History:  Procedure Laterality Date   BACK SURGERY     ESOPHAGOGASTRODUODENOSCOPY (EGD) WITH PROPOFOL N/A 08/01/2022   Procedure: ESOPHAGOGASTRODUODENOSCOPY (EGD) WITH PROPOFOL;  Surgeon: Jeani Hawking, MD;  Location: [facility] ENDOSCOPY;  Service: Gastroenterology;  Laterality: N/A;   EUS N/A 08/01/2022   Procedure: UPPER ENDOSCOPIC ULTRASOUND (EUS) LINEAR;  Surgeon: Jeani Hawking, MD;  Location: [facility] ENDOSCOPY;  Service: Gastroenterology;  Laterality: N/A;   FINE NEEDLE ASPIRATION  08/01/2022   Procedure: FINE NEEDLE ASPIRATION (FNA) LINEAR;  Surgeon: Jeani Hawking, MD;  Location: [facility] ENDOSCOPY;  Service: Gastroenterology;;   FRACTURE SURGERY Right 2007   arm   XI ROBOTIC LAPAROSCOPIC ASSISTED APPENDECTOMY N/A 05/16/2022   Procedure:  XI ROBOTIC LAPAROSCOPIC ASSISTED APPENDECTOMY;  Surgeon: Henrene Dodge, MD;  Location: [facility] ORS;  Service: General;  Laterality: N/A;   Social History   Tobacco Use   Smoking status: Every Day    Current packs/day: 0.50    Types: Cigarettes   Smokeless tobacco:  Never  Vaping Use   Vaping status: Never Used  Substance Use Topics   Alcohol use: Yes    Alcohol/week: 21.0 standard drinks of alcohol    Types: 21 Shots of liquor per week    Comment: 3 shots of liquor everyday   Drug use: Not Currently   Social History   Socioeconomic History   Marital status: Single    Spouse name: Not on file   Number of children: Not on file   Years of education: Not on file   Highest education level: Not on file  Occupational History   Not on file  Tobacco Use   Smoking status: Every Day    Current packs/day: 0.50    Types: Cigarettes   Smokeless tobacco: Never  Vaping Use   Vaping status: Never Used  Substance and Sexual Activity   Alcohol use: Yes    Alcohol/week: 21.0 standard drinks of alcohol    Types: 21 Shots of liquor per week    Comment: 3 shots of liquor everyday   Drug use: Not Currently   Sexual activity: Yes  Other Topics Concern   Not on file  Social History Narrative   Not on file   Social Drivers of Health   Financial Resource Strain: Not on file  Food Insecurity: No Food Insecurity (12/06/2022)   Hunger Vital Sign    Worried About Running Out of Food in the Last Year: Never true    Ran Out of Food in the Last Year: Never true  Transportation Needs: No Transportation Needs (12/06/2022)   PRAPARE - Administrator, Civil Service (Medical): No    Lack of Transportation (Non-Medical): No  Physical Activity: Not on file  Stress: Not on file  Social Connections: Not on file  Intimate Partner Violence: Not At Risk (12/06/2022)   Humiliation, Afraid, Rape, and Kick questionnaire    Fear of Current or Ex-Partner: No    Emotionally Abused: No    Physically Abused: No    Sexually Abused: No   Family Status  Relation Name Status   Mother  Deceased   Father  Alive  No partnership data on file   No family history on file. No Known Allergies    Review of Systems  Gastrointestinal:  Negative for abdominal pain,  constipation, diarrhea, heartburn, nausea and vomiting.  Musculoskeletal:  Positive for joint pain.      Objective:     BP 124/76 (Cuff Size: Normal)   Pulse 94   Temp 98.5 F (36.9 C) (Oral)   Resp 16   Ht 5\' 8"  (1.727 m)   Wt 135 lb (61.2 kg)   SpO2 98%   BMI 20.53 kg/m  BP Readings from Last 3 Encounters:  05/21/23 124/76  03/24/23 (!) 177/106  01/22/23 (!) 170/107   Wt Readings from Last 3 Encounters:  05/21/23 135 lb (61.2 kg)  03/24/23 132 lb 9.6 oz (60.1 kg)  01/22/23 124 lb (56.2 kg)      Physical Exam Constitutional:      Appearance: Normal appearance.  HENT:     Head: Normocephalic and atraumatic.  Eyes:     Conjunctiva/sclera: Conjunctivae normal.  Cardiovascular:  Rate and Rhythm: Normal rate and regular rhythm.  Pulmonary:     Effort: Pulmonary effort is normal.     Breath sounds: Normal breath sounds.  Musculoskeletal:        General: Tenderness present. Normal range of motion.     Left shoulder: Crepitus present. No swelling. Normal range of motion.     Comments: Tenderness at the [facility] joint on the left but good strength and ROM  Skin:    General: Skin is warm and dry.  Neurological:     General: No focal deficit present.     Mental Status: He is alert. Mental status is at baseline.  Psychiatric:        Mood and Affect: Mood normal.        Behavior: Behavior normal.      No results found for any visits on 05/21/23.  Last CBC Lab Results  Component Value Date   WBC 10.6 (H) 12/07/2022   HGB 16.8 12/07/2022   HCT 50.5 12/07/2022   MCV 95.5 12/07/2022   MCH 31.8 12/07/2022   RDW 12.6 12/07/2022   PLT 227 12/07/2022   Last metabolic panel Lab Results  Component Value Date   GLUCOSE 93 12/07/2022   NA 134 (L) 12/07/2022   K 4.2 12/07/2022   CL 104 12/07/2022   CO2 19 (L) 12/07/2022   BUN 6 12/07/2022   CREATININE 0.65 12/07/2022   GFRNONAA >60 12/07/2022   CALCIUM 8.2 (L) 12/07/2022   PROT 6.7 12/06/2022   ALBUMIN 3.9  12/06/2022   BILITOT 0.8 12/06/2022   ALKPHOS 73 12/06/2022   AST 19 12/06/2022   ALT 10 12/06/2022   ANIONGAP 11 12/07/2022   Last lipids Lab Results  Component Value Date   CHOL 168 08/14/2022   HDL 40 08/14/2022   LDLCALC 115 (H) 08/14/2022   TRIG 65 08/14/2022   CHOLHDL 2.8 04/30/2022   Last hemoglobin A1c No results found for: "HGBA1C" Last thyroid functions No results found for: "TSH", "T3TOTAL", "T4TOTAL", "THYROIDAB" Last vitamin D No results found for: "25OHVITD2", "25OHVITD3", "VD25OH" Last vitamin B12 and Folate Lab Results  Component Value Date   VITAMINB12 333 08/14/2022      The 10-year ASCVD risk score (Arnett DK, et al., 2019) is: 8.4%    Assessment & Plan:  Hypertension, unspecified type Assessment & Plan: Blood pressure stable here today, no changes made to medications and appropriate refills sent to pharmacy.    Orders: -     Lisinopril; Take 1 tablet (20 mg total) by mouth daily.  Dispense: 90 tablet; Refill: 1  Anterior subluxation of left shoulder, sequela -     Ambulatory referral to Occupational Therapy  Pancreatic pseudocyst Assessment & Plan: Reviewed GI note from December and MRCP from November, pseudocyst appears to be decreasing in size. Patient will call to schedule repeat MRCP and GI follow up. He is not taking Protonix, advised he should start this again.    Shoulder exam benign with exception of crepitus and pain at the [facility] joint, will place referral to OT for strengthening to try to prevent recurrent subluxation.    Return in about 6 months (around 11/18/2023).    Margarita Mail, DO

## 2023-05-21 NOTE — Assessment & Plan Note (Signed)
Blood pressure stable here today, no changes made to medications and appropriate refills sent to pharmacy.

## 2023-05-21 NOTE — Assessment & Plan Note (Signed)
Reviewed GI note from December and MRCP from November, pseudocyst appears to be decreasing in size. Patient will call to schedule repeat MRCP and GI follow up. He is not taking Protonix, advised he should start this again.

## 2023-06-04 ENCOUNTER — Encounter (HOSPITAL_COMMUNITY): Payer: Medicaid Other | Admitting: Occupational Therapy

## 2023-06-24 NOTE — Progress Notes (Unsigned)
 Celso Amy, PA-C [address]  [address]  [place], [place] [address]  Main: [phone]  Fax: [phone]   Primary Care Physician: Margarita Mail, DO  Primary Gastroenterologist:  Celso Amy, PA-C   CC: F/U alcoholic pancreatitis with pseudocyst  HPI: Isaac Meza is a 53 y.o. male returns for 17-month follow-up of alcoholic pancreatitis with pseudocyst, originally diagnosed March 2024.  He has not any episodes of upper abdominal pain, nausea, or vomiting in 5 months.  No abdominal pain today.  He denies diarrhea, constipation, or rectal bleeding.  No GI current GI sympotms.  He is no longer taking Pantoprazole.  No current GERD treatment.  Denies heartburn.  He admits to drinking 2-3 shots of Vodka in orange juice 3 days per week.  Last drink of alcohol was yesterday.  He has attended AA meeting in the past, but states it did not help him.  Previous history of illicit drug use and he has abstained from drug use.   06/2022 CT: Acute pancreatitis with no cyst 07/2022 CT: Acute pancreatitis with new 3.9 x 4.0 x 5.7 cm pancreatic pseudocyst.   07/2022 underwent EUS with aspiration of pancreatic pseudocyst by Dr. Elnoria Howard in [place].  No malignancy.  HCTZ was discontinued.  Etiology of pancreatitis was thought due to alcohol.   08/2022 lipid panel: Triglycerides 65, total cholesterol 168.  Hemoglobin 14.4.   09/2022 CT abdomen pelvis with contrast: Decrease in size of pseudocyst.  Measuring 3.3 x 2.4 x 4.0 cm.   11/2022 CT abdomen pelvis with contrast: Enlargement of pseudocyst (3.5 x 3.1 cm) with possible fistula.   02/2023 abdominal MRI/MRCP with and without contrast: Slightly decreased size of pancreatic pseudocyst 2.9 x 2.7 cm.  Mild adjacent inflammatory fat stranding, decreased compared to prior exam.  Consistent with mild residual or recurrent pancreatitis.  No fistula.  No liver lesions.  No gallstones.   05/2022: Screening Cologuard test was negative.  Repeat  Cologuard in 3 years.  Current Outpatient Medications  Medication Sig Dispense Refill   lisinopril (ZESTRIL) 20 MG tablet Take 1 tablet (20 mg total) by mouth daily. 90 tablet 1   No current facility-administered medications for this visit.    Allergies as of 06/25/2023   (No Known Allergies)    Past Medical History:  Diagnosis Date   Asthma    childhood   Hypertension     Past Surgical History:  Procedure Laterality Date   BACK SURGERY     ESOPHAGOGASTRODUODENOSCOPY (EGD) WITH PROPOFOL N/A 08/01/2022   Procedure: ESOPHAGOGASTRODUODENOSCOPY (EGD) WITH PROPOFOL;  Surgeon: Jeani Hawking, MD;  Location: [facility] ENDOSCOPY;  Service: Gastroenterology;  Laterality: N/A;   EUS N/A 08/01/2022   Procedure: UPPER ENDOSCOPIC ULTRASOUND (EUS) LINEAR;  Surgeon: Jeani Hawking, MD;  Location: [facility] ENDOSCOPY;  Service: Gastroenterology;  Laterality: N/A;   FINE NEEDLE ASPIRATION  08/01/2022   Procedure: FINE NEEDLE ASPIRATION (FNA) LINEAR;  Surgeon: Jeani Hawking, MD;  Location: [facility] ENDOSCOPY;  Service: Gastroenterology;;   FRACTURE SURGERY Right 2007   arm   XI ROBOTIC LAPAROSCOPIC ASSISTED APPENDECTOMY N/A 05/16/2022   Procedure: XI ROBOTIC LAPAROSCOPIC ASSISTED APPENDECTOMY;  Surgeon: Henrene Dodge, MD;  Location: [facility] ORS;  Service: General;  Laterality: N/A;    Review of Systems:    All systems reviewed and negative except where noted in HPI.   Physical Examination:   BP 132/81   Pulse 71   Temp 98.1 F (36.7 C)   Ht 5\' 8"  (1.727 m)   Wt 131 lb  12.8 oz (59.8 kg)   BMI 20.04 kg/m   General: Well-nourished, well-developed in no acute distress.  Lungs: Clear to auscultation bilaterally. Non-labored. Heart: Regular rate and rhythm, no murmurs rubs or gallops.  Abdomen: Bowel sounds are normal; Abdomen is Soft; No hepatosplenomegaly, masses or hernias;  No Abdominal Tenderness; No guarding or rebound tenderness. Neuro: Alert and oriented x 3.  Grossly intact.  Psych: Alert and cooperative,  normal mood and affect.   Imaging Studies: No results found.  Assessment and Plan:   Isaac Meza is a 53 y.o. y/o male returns for follow-up of alcoholic pancreatitis with pseudocyst originally diagnosed 06/2022.  EUS with FNA showed no malignancy.  Normal triglycerides.  Pseudocyst has been decreasing with no evidence of fistula.  No evidence of pancreas divisum.   1.  Chronic Alcoholic pancreatitis with pseudocyst, decreasing in size.             Scheduling 6-month repeat abdominal pancreas MRI/MRCP with and without contrast.    2.  Alcohol Dependence - Current Use.  Encouraged patient to avoid all alcohol.  Resources discussed such as Alcoholics Anonymous.  Also f/u with PCP to help with alcohol cessation.   3.  Colon cancer screening             Negative Cologuard test 05/2022.             Repeat screening Cologuard or screening colonoscopy in 3 years.             Colon cancer screening guidelines discussed.  Celso Amy, PA-C  Follow up Will be based on MRI results and GI symptoms.

## 2023-06-25 ENCOUNTER — Ambulatory Visit: Payer: Medicaid Other | Admitting: Physician Assistant

## 2023-06-25 ENCOUNTER — Encounter: Payer: Self-pay | Admitting: Physician Assistant

## 2023-06-25 VITALS — BP 132/81 | HR 71 | Temp 98.1°F | Ht 68.0 in | Wt 131.8 lb

## 2023-06-25 DIAGNOSIS — K863 Pseudocyst of pancreas: Secondary | ICD-10-CM | POA: Diagnosis not present

## 2023-06-25 DIAGNOSIS — Z8719 Personal history of other diseases of the digestive system: Secondary | ICD-10-CM

## 2023-06-25 DIAGNOSIS — K860 Alcohol-induced chronic pancreatitis: Secondary | ICD-10-CM | POA: Diagnosis not present

## 2023-06-25 DIAGNOSIS — K8681 Exocrine pancreatic insufficiency: Secondary | ICD-10-CM

## 2023-06-25 DIAGNOSIS — F1029 Alcohol dependence with unspecified alcohol-induced disorder: Secondary | ICD-10-CM

## 2023-06-25 DIAGNOSIS — F1020 Alcohol dependence, uncomplicated: Secondary | ICD-10-CM

## 2023-06-25 NOTE — Patient Instructions (Addendum)
 MRI scheduled 07-01-23 @ 10:45 am [facility] entrance. Nothing to eat/drink 4 hours prior.  It was good to see you today!  I strongly recommend stopping all alcohol to prevent recurrent pancreatitis. Your PCP may be able to help with alohol cessation.  We will let you know once we receive abdominal MRI results.

## 2023-07-01 ENCOUNTER — Ambulatory Visit
Admission: RE | Admit: 2023-07-01 | Discharge: 2023-07-01 | Disposition: A | Discharge status: Home / Self Care | Source: Ambulatory Visit | Attending: Physician Assistant | Admitting: Physician Assistant

## 2023-07-01 ENCOUNTER — Other Ambulatory Visit: Payer: Self-pay | Admitting: Physician Assistant

## 2023-07-01 DIAGNOSIS — Z8719 Personal history of other diseases of the digestive system: Secondary | ICD-10-CM

## 2023-07-01 DIAGNOSIS — K859 Acute pancreatitis without necrosis or infection, unspecified: Secondary | ICD-10-CM | POA: Diagnosis not present

## 2023-07-01 DIAGNOSIS — K863 Pseudocyst of pancreas: Secondary | ICD-10-CM | POA: Diagnosis not present

## 2023-07-01 MED ORDER — GADOBUTROL 1 MMOL/ML IV SOLN
5.0000 mL | Freq: Once | INTRAVENOUS | Status: AC | PRN
Start: 2023-07-01 — End: 2023-07-01
  Administered 2023-07-01: 5 mL via INTRAVENOUS

## 2023-07-14 NOTE — Progress Notes (Signed)
 Call and notify patient the Abdominal MRI shows his Pancreas Pseudocyst has resolved.  NO current pancreatitis or Cyst.  This is great news!  MRI is Normal.  Continue with current plan. Isaac Amy, PA-C

## 2023-07-15 NOTE — Progress Notes (Signed)
 Call and notify patient the Abdominal MRI shows his Pancreas Pseudocyst has resolved.  NO current pancreatitis or Cyst.  This is great news!  MRI is Normal.  Continue with current plan. Celso Amy, PA-C

## 2023-08-29 ENCOUNTER — Emergency Department

## 2023-08-29 ENCOUNTER — Other Ambulatory Visit: Payer: Self-pay

## 2023-08-29 ENCOUNTER — Emergency Department
Admission: EM | Admit: 2023-08-29 | Discharge: 2023-08-29 | Disposition: A | Discharge status: Home / Self Care | Attending: Emergency Medicine | Admitting: Emergency Medicine

## 2023-08-29 DIAGNOSIS — S99921A Unspecified injury of right foot, initial encounter: Secondary | ICD-10-CM | POA: Diagnosis present

## 2023-08-29 DIAGNOSIS — I10 Essential (primary) hypertension: Secondary | ICD-10-CM | POA: Insufficient documentation

## 2023-08-29 DIAGNOSIS — J45909 Unspecified asthma, uncomplicated: Secondary | ICD-10-CM | POA: Insufficient documentation

## 2023-08-29 DIAGNOSIS — S93601A Unspecified sprain of right foot, initial encounter: Secondary | ICD-10-CM | POA: Diagnosis not present

## 2023-08-29 DIAGNOSIS — X500XXA Overexertion from strenuous movement or load, initial encounter: Secondary | ICD-10-CM | POA: Insufficient documentation

## 2023-08-29 NOTE — ED Notes (Signed)
Ace wrap and post op shoe applied to right foot

## 2023-08-29 NOTE — Discharge Instructions (Signed)
 Elevate and ice your foot.  When up and walking.  You do not have to sleep in it.  Return if worsening See podiatry if you have not improved in 1 week so they can recheck your foot

## 2023-08-29 NOTE — ED Provider Notes (Signed)
 [facility] Provider Note    Event Date/Time   First MD Initiated Contact with Patient 08/29/23 1424     (approximate)   History   Foot Injury   HPI  Isaac Meza is a 53 y.o. male history of hypertension, asthma presents emergency department complaint of right foot pain.  Patient states he twisted his foot over.  This happened today.  Pain at the lateral side of the right foot.  Hurts to bear weight.  No numbness or tingling.  No other injury      Physical Exam   Triage Vital Signs: ED Triage Vitals  Encounter Vitals Group     BP 08/29/23 1401 (!) 155/94     Systolic BP Percentile --      Diastolic BP Percentile --      Pulse Rate 08/29/23 1401 75     Resp 08/29/23 1401 20     Temp 08/29/23 1401 (!) 97 F (36.1 C)     Temp Source 08/29/23 1401 Axillary     SpO2 08/29/23 1401 100 %     Weight 08/29/23 1358 135 lb (61.2 kg)     Height 08/29/23 1358 5\' 8"  (1.727 m)     Head Circumference --      Peak Flow --      Pain Score 08/29/23 1358 10     Pain Loc --      Pain Education --      Exclude from Growth Chart --     Most recent vital signs: Vitals:   08/29/23 1401  BP: (!) 155/94  Pulse: 75  Resp: 20  Temp: (!) 97 F (36.1 C)  SpO2: 100%     General: Awake, no distress.   CV:  Good peripheral perfusion.  Resp:  Normal effort.  Abd:  No distention.   Other:  Right foot tender along the fifth metatarsal, minimal swelling noted, neurovascular intact   ED Results / Procedures / Treatments   Labs (all labs ordered are listed, but only abnormal results are displayed) Labs Reviewed - No data to display   EKG     RADIOLOGY X-ray of the right foot    PROCEDURES:   Procedures  Critical Care: No Chief Complaint  Patient presents with   Foot Injury      MEDICATIONS ORDERED IN ED: Medications - No data to display   IMPRESSION / MDM / ASSESSMENT AND PLAN / ED COURSE  I reviewed the triage vital signs and  the nursing notes.                              Differential diagnosis includes, but is not limited to, sprained foot, fracture foot, contusion  Patient's presentation is most consistent with acute illness / injury with system symptoms.   Cardiac monitor no Medications given: None  X-ray of the right foot was independently reviewed interpreted by me as being negative for any acute abnormality  I did explain these findings to the patient.  He is placed in a Ace wrap and wooden shoe.  Follow-up podiatry in 1 week if not improving.  Over-the-counter measures discussed.  Elevate and ice.  Patient is in agreement treatment plan.  Discharged stable condition.      FINAL CLINICAL IMPRESSION(S) / ED DIAGNOSES   Final diagnoses:  Sprain of right foot, initial encounter     Rx / DC Orders   ED Discharge Orders  None        Note:  This document was prepared using Dragon voice recognition software and may include unintentional dictation errors.    Delsie Figures, PA-C 08/29/23 1508    Marylynn Soho, MD 08/29/23 [phone]

## 2023-08-29 NOTE — ED Triage Notes (Signed)
 Pt to ED with wife POV for R foot injury today. Pt stepped on uneven surface and his foot rolled. Top of foot has swelling, no other deformities noted. Pt rates pain as 10/10. Daily drinker, 1 beer today after injury.

## 2023-11-20 ENCOUNTER — Other Ambulatory Visit: Payer: Self-pay | Admitting: Internal Medicine

## 2023-11-20 DIAGNOSIS — I10 Essential (primary) hypertension: Secondary | ICD-10-CM

## 2023-11-24 ENCOUNTER — Other Ambulatory Visit: Payer: Self-pay

## 2023-11-24 ENCOUNTER — Ambulatory Visit: Payer: Medicaid Other | Admitting: Internal Medicine

## 2023-11-24 ENCOUNTER — Encounter: Payer: Self-pay | Admitting: Internal Medicine

## 2023-11-24 VITALS — BP 138/80 | HR 84 | Temp 98.0°F | Resp 16 | Ht 68.0 in | Wt 126.2 lb

## 2023-11-24 DIAGNOSIS — E162 Hypoglycemia, unspecified: Secondary | ICD-10-CM

## 2023-11-24 DIAGNOSIS — S43012S Anterior subluxation of left humerus, sequela: Secondary | ICD-10-CM | POA: Diagnosis not present

## 2023-11-24 DIAGNOSIS — I10 Essential (primary) hypertension: Secondary | ICD-10-CM

## 2023-11-24 DIAGNOSIS — Z125 Encounter for screening for malignant neoplasm of prostate: Secondary | ICD-10-CM | POA: Diagnosis not present

## 2023-11-24 DIAGNOSIS — Z13220 Encounter for screening for lipoid disorders: Secondary | ICD-10-CM

## 2023-11-24 MED ORDER — LISINOPRIL 20 MG PO TABS: 20.0000 mg | ORAL_TABLET | Freq: Every day | ORAL | 1 refills | Status: DC

## 2023-11-24 NOTE — Progress Notes (Signed)
 Established Patient Office Visit  Subjective   Patient ID: Isaac Meza, male    DOB: 1970-04-15  Age: 53 y.o. MRN: [id number]  Chief Complaint  Patient presents with   Medical Management of Chronic Issues    HPI  Isaac Meza presents for follow up on chronic medical conditions.  Discussed the use of AI scribe software for clinical note transcription with the patient, who gave verbal consent to proceed.  History of Present Illness Isaac Meza is a 53 year old male with a history of shoulder dislocation and pancreatitis who presents for a six-month follow-up visit.  He continues to experience issues with his left shoulder, with occasional dislocations. The frequency has decreased since his last visit. He previously attended occupational therapy in [place] but prefers a location closer to home.  Last night, he felt clammy and sweaty, similar to hypoglycemic episodes. His blood pressure was 115, and he had just eaten dinner. Eating an apple usually alleviates these symptoms. He has pancreatitis, which affects his blood sugar regulation.  He takes lisinopril  and uses [facility] on [facility] [address] for prescriptions. He smokes but does not frequently experience illness.   Hx of Pancreatic pseudocyst and chronic pancreatitis: -CT of the abdomen showed chronic pancreatic pseudocyst with fluid collection adjacent to the pancreatic head and duodenum 6/24 -EGD with EUS with aspiration of pseudocyst performed on 4/26 with erosive gastropathy noted.  -Currently on Protonix  40 mg -Following with G -MRCP 11/24 showing a decrease in size of pseudocyst, supposed to repeat in 3 months, patient will call to schedule -Not taking Protonix  currently  Hypertension: -Medications: Lisinopril  20 mg -Previous Meds: HCTZ 12.5 mg discontinued while inpatient  -Checking BP at home (average): not checking currently  -Denies any SOB, CP, vision changes, LE edema or symptoms of hypotension -Family  history of father with MI in 69's, uncle in 14's and brother in upper 23's   HLD: -Medications: Nothing -Last lipid panel: Lipid Panel     Component Value Date/Time   CHOL 168 08/14/2022 0958   TRIG 65 08/14/2022 0958   HDL 40 08/14/2022 0958   CHOLHDL 2.8 04/30/2022 1116   LDLCALC 115 (H) 08/14/2022 0958   LDLCALC 93 04/30/2022 1116   LABVLDL 13 08/14/2022 0958   Asthma, childhood:  -Asthma status: controlled -Current Treatments: Nothing, hasn't been on anything since childhood  -Dyspnea frequency: None -Wheezing frequency: None -Cough frequency: None  -Nocturnal symptom frequency: None  -Limitation of activity: no -Visits to ER or Urgent Care in past year: no  Health Maintenance: -Blood work due  -Colon cancer screening: Cologuard negative 2/24  Patient Active Problem List   Diagnosis Date Noted   Epigastric abdominal pain 12/06/2022   Hypertensive urgency 12/06/2022   Alcohol use disorder 12/06/2022   Erosive gastropathy 12/06/2022   Erythrocytosis 12/06/2022   Protein-calorie malnutrition, severe 08/01/2022   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   Abnormal CT of the abdomen 07/30/2022   Pancreatic cyst 07/30/2022   Nausea and vomiting 07/30/2022   Chronic pancreatitis (HCC) 07/29/2022   Dehydration 07/29/2022   Duodenal obstruction 07/27/2022   Acute appendicitis 05/16/2022   Hypertension 04/30/2022   Long term current use of opiate analgesic 09/12/2020   Bilateral thoracic back pain 10/13/2017   Past Medical History:  Diagnosis Date   Asthma    childhood   Hypertension    Past Surgical History:  Procedure Laterality Date   APPENDECTOMY  2024   BACK SURGERY     ESOPHAGOGASTRODUODENOSCOPY (EGD) WITH PROPOFOL  N/A 08/01/2022  Procedure: ESOPHAGOGASTRODUODENOSCOPY (EGD) WITH PROPOFOL ;  Surgeon: Rollin Dover, MD;  Location: [facility] ENDOSCOPY;  Service: Gastroenterology;  Laterality: N/A;   EUS N/A 08/01/2022   Procedure: UPPER ENDOSCOPIC ULTRASOUND (EUS) LINEAR;   Surgeon: Rollin Dover, MD;  Location: [facility] ENDOSCOPY;  Service: Gastroenterology;  Laterality: N/A;   FINE NEEDLE ASPIRATION  08/01/2022   Procedure: FINE NEEDLE ASPIRATION (FNA) LINEAR;  Surgeon: Rollin Dover, MD;  Location: [facility] ENDOSCOPY;  Service: Gastroenterology;;   FRACTURE SURGERY Right 2007   arm   XI ROBOTIC LAPAROSCOPIC ASSISTED APPENDECTOMY N/A 05/16/2022   Procedure: XI ROBOTIC LAPAROSCOPIC ASSISTED APPENDECTOMY;  Surgeon: Desiderio Schanz, MD;  Location: [facility] ORS;  Service: General;  Laterality: N/A;   Social History   Tobacco Use   Smoking status: Every Day    Current packs/day: 0.50    Average packs/day: 0.5 packs/day for 40.2 years (20.1 ttl pk-yrs)    Types: Cigarettes    Start date: 08/29/1983   Smokeless tobacco: Never  Vaping Use   Vaping status: Never Used  Substance Use Topics   Alcohol use: Yes    Alcohol/week: 21.0 standard drinks of alcohol    Types: 21 Shots of liquor per week    Comment: 3 shots of liquor everyday   Drug use: Not Currently   Social History   Socioeconomic History   Marital status: Single    Spouse name: Not on file   Number of children: Not on file   Years of education: Not on file   Highest education level: Not on file  Occupational History   Not on file  Tobacco Use   Smoking status: Every Day    Current packs/day: 0.50    Average packs/day: 0.5 packs/day for 40.2 years (20.1 ttl pk-yrs)    Types: Cigarettes    Start date: 08/29/1983   Smokeless tobacco: Never  Vaping Use   Vaping status: Never Used  Substance and Sexual Activity   Alcohol use: Yes    Alcohol/week: 21.0 standard drinks of alcohol    Types: 21 Shots of liquor per week    Comment: 3 shots of liquor everyday   Drug use: Not Currently   Sexual activity: Yes  Other Topics Concern   Not on file  Social History Narrative   Not on file   Social Drivers of Health   Financial Resource Strain: Not on file  Food Insecurity: No Food Insecurity (12/06/2022)    Hunger Vital Sign    Worried About Running Out of Food in the Last Year: Never true    Ran Out of Food in the Last Year: Never true  Transportation Needs: No Transportation Needs (12/06/2022)   PRAPARE - Administrator, Civil Service (Medical): No    Lack of Transportation (Non-Medical): No  Physical Activity: Not on file  Stress: Not on file  Social Connections: Not on file  Intimate Partner Violence: Not At Risk (12/06/2022)   Humiliation, Afraid, Rape, and Kick questionnaire    Fear of Current or Ex-Partner: No    Emotionally Abused: No    Physically Abused: No    Sexually Abused: No   Family Status  Relation Name Status   Mother  Deceased   Father  Alive  No partnership data on file   No family history on file. No Known Allergies    Review of Systems  Gastrointestinal:  Negative for abdominal pain, constipation, diarrhea, heartburn, nausea and vomiting.  Musculoskeletal:  Positive for joint pain.      Objective:  BP 138/80 (Cuff Size: Large)   Pulse 84   Temp 98 F (36.7 C) (Oral)   Resp 16   Ht 5' 8 (1.727 m)   Wt 126 lb 3.2 oz (57.2 kg)   SpO2 98%   BMI 19.19 kg/m  BP Readings from Last 3 Encounters:  11/24/23 138/80  08/29/23 (!) 155/94  06/25/23 132/81   Wt Readings from Last 3 Encounters:  11/24/23 126 lb 3.2 oz (57.2 kg)  08/29/23 135 lb (61.2 kg)  06/25/23 131 lb 12.8 oz (59.8 kg)      Physical Exam Constitutional:      Appearance: Normal appearance.  HENT:     Head: Normocephalic and atraumatic.  Eyes:     Conjunctiva/sclera: Conjunctivae normal.  Cardiovascular:     Rate and Rhythm: Normal rate and regular rhythm.  Pulmonary:     Effort: Pulmonary effort is normal.     Breath sounds: Normal breath sounds.  Musculoskeletal:     Left shoulder: Crepitus present. No swelling. Normal range of motion.     Comments: Tenderness at the [facility] joint on the left but good strength and ROM  Skin:    General: Skin is warm and dry.   Neurological:     General: No focal deficit present.     Mental Status: He is alert. Mental status is at baseline.  Psychiatric:        Mood and Affect: Mood normal.        Behavior: Behavior normal.      No results found for any visits on 11/24/23.  Last CBC Lab Results  Component Value Date   WBC 10.6 (H) 12/07/2022   HGB 16.8 12/07/2022   HCT 50.5 12/07/2022   MCV 95.5 12/07/2022   MCH 31.8 12/07/2022   RDW 12.6 12/07/2022   PLT 227 12/07/2022   Last metabolic panel Lab Results  Component Value Date   GLUCOSE 93 12/07/2022   NA 134 (L) 12/07/2022   K 4.2 12/07/2022   CL 104 12/07/2022   CO2 19 (L) 12/07/2022   BUN 6 12/07/2022   CREATININE 0.65 12/07/2022   GFRNONAA >60 12/07/2022   CALCIUM  8.2 (L) 12/07/2022   PROT 6.7 12/06/2022   ALBUMIN 3.9 12/06/2022   BILITOT 0.8 12/06/2022   ALKPHOS 73 12/06/2022   AST 19 12/06/2022   ALT 10 12/06/2022   ANIONGAP 11 12/07/2022   Last lipids Lab Results  Component Value Date   CHOL 168 08/14/2022   HDL 40 08/14/2022   LDLCALC 115 (H) 08/14/2022   TRIG 65 08/14/2022   CHOLHDL 2.8 04/30/2022   Last hemoglobin A1c No results found for: HGBA1C Last thyroid functions No results found for: TSH, T3TOTAL, T4TOTAL, THYROIDAB Last vitamin D No results found for: 25OHVITD2, 25OHVITD3, VD25OH Last vitamin B12 and Folate Lab Results  Component Value Date   VITAMINB12 333 08/14/2022      The 10-year ASCVD risk score (Arnett DK, et al., 2019) is: 10.7%    Assessment & Plan:   Assessment & Plan 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 Previous referral error corrected. - Resubmit referral for occupational therapy to [facility] in [place]. - Instruct him to specify preferred location when contacted for scheduling.  Hypertension Blood pressure controlled at 138/80 mmHg. Clammy episode likely related to blood sugar, not blood pressure. - Refill lisinopril   prescription - Labs due.   History of pancreatitis Chronic pancreatitis requires monitoring of blood sugar due to potential hypoglycemia. - Order laboratory  tests including A1c to assess average blood sugar over the last three months. - Monitor blood sugar levels closely.  - Ambulatory referral to Occupational Therapy - CBC w/Diff/Platelet - Comprehensive Metabolic Panel (CMET) - lisinopril  (ZESTRIL ) 20 MG tablet; Take 1 tablet (20 mg total) by mouth daily.  Dispense: 90 tablet; Refill: 1 - Lipid Profile - HgB A1c - PSA   Return in about 6 months (around 05/26/2024).    Sharyle Fischer, DO

## 2023-11-24 NOTE — Telephone Encounter (Signed)
 Requested Prescriptions  Pending Prescriptions Disp Refills   lisinopril  (ZESTRIL ) 20 MG tablet [Pharmacy Med Name: LISINOPRIL  20MG  TABLETS] 90 tablet 0    Sig: TAKE 1 TABLET(20 MG) BY MOUTH DAILY     Cardiovascular:  ACE Inhibitors Failed - 11/24/2023  8:17 AM      Failed - Cr in normal range and within 180 days    Creat  Date Value Ref Range Status  05/29/2022 0.75 0.60 - 1.29 mg/dL Final   Creatinine, Ser  Date Value Ref Range Status  12/07/2022 0.65 0.61 - 1.24 mg/dL Final         Failed - K in normal range and within 180 days    Potassium  Date Value Ref Range Status  12/07/2022 4.2 3.5 - 5.1 mmol/L Final         Failed - Last BP in normal range    BP Readings from Last 1 Encounters:  08/29/23 (!) 155/94         Failed - Valid encounter within last 6 months    Recent Outpatient Visits           6 months ago Hypertension, unspecified type   [facility] [name], DO   6 years ago Traumatic pneumothorax, initial encounter   Primary Care at [facility], [place], [place]       Future Appointments             Today [name], DO [facility], [facility]            Passed - Patient is not pregnant

## 2023-11-25 ENCOUNTER — Ambulatory Visit: Payer: Self-pay | Admitting: Internal Medicine

## 2023-11-25 LAB — COMPREHENSIVE METABOLIC PANEL WITH GFR
AG Ratio: 1.9 (calc) (ref 1.0–2.5)
ALT: 12 U/L (ref 9–46)
AST: 17 U/L (ref 10–35)
Albumin: 4.7 g/dL (ref 3.6–5.1)
Alkaline phosphatase (APISO): 67 U/L (ref 35–144)
BUN: 11 mg/dL (ref 7–25)
CO2: 28 mmol/L (ref 20–32)
Calcium: 9.5 mg/dL (ref 8.6–10.3)
Chloride: 102 mmol/L (ref 98–110)
Creat: 0.82 mg/dL (ref 0.70–1.30)
Globulin: 2.5 g/dL (ref 1.9–3.7)
Glucose, Bld: 144 mg/dL — ABNORMAL HIGH (ref 65–99)
Potassium: 5.3 mmol/L (ref 3.5–5.3)
Sodium: 137 mmol/L (ref 135–146)
Total Bilirubin: 0.4 mg/dL (ref 0.2–1.2)
Total Protein: 7.2 g/dL (ref 6.1–8.1)
eGFR: 106 mL/min/1.73m2 (ref >=60)

## 2023-11-25 LAB — CBC WITH DIFFERENTIAL/PLATELET
Absolute Lymphocytes: 2083 {cells}/uL (ref 850–3900)
Absolute Monocytes: 605 {cells}/uL (ref 200–950)
Basophils Absolute: 17 {cells}/uL (ref 0–200)
Basophils Relative: 0.2 %
Eosinophils Absolute: 92 {cells}/uL (ref 15–500)
Eosinophils Relative: 1.1 %
HCT: 48.7 % (ref 38.5–50.0)
Hemoglobin: 16.3 g/dL (ref 13.2–17.1)
MCH: 32.4 pg (ref 27.0–33.0)
MCHC: 33.5 g/dL (ref 32.0–36.0)
MCV: 96.8 fL (ref 80.0–100.0)
MPV: 9.4 fL (ref 7.5–12.5)
Monocytes Relative: 7.2 %
Neutro Abs: 5603 {cells}/uL (ref 1500–7800)
Neutrophils Relative %: 66.7 %
Platelets: 338 Thousand/uL (ref 140–400)
RBC: 5.03 Million/uL (ref 4.20–5.80)
RDW: 12.3 % (ref 11.0–15.0)
Total Lymphocyte: 24.8 %
WBC: 8.4 Thousand/uL (ref 3.8–10.8)

## 2023-11-25 LAB — LIPID PANEL
Cholesterol: 216 mg/dL — ABNORMAL HIGH (ref <200)
HDL: 71 mg/dL (ref >=40)
LDL Cholesterol (Calc): 130 mg/dL — ABNORMAL HIGH
Non-HDL Cholesterol (Calc): 145 mg/dL — ABNORMAL HIGH (ref <130)
Total CHOL/HDL Ratio: 3 (calc) (ref <5.0)
Triglycerides: 55 mg/dL (ref <150)

## 2023-11-25 LAB — HEMOGLOBIN A1C
Hgb A1c MFr Bld: 5.4 % (ref <5.7)
Mean Plasma Glucose: 108 mg/dL
eAG (mmol/L): 6 mmol/L

## 2023-11-25 LAB — PSA: PSA: 0.56 ng/mL (ref <=4.00)

## 2023-11-26 ENCOUNTER — Other Ambulatory Visit: Payer: Self-pay | Admitting: Internal Medicine

## 2023-11-26 DIAGNOSIS — E782 Mixed hyperlipidemia: Secondary | ICD-10-CM

## 2023-11-26 MED ORDER — ROSUVASTATIN CALCIUM 10 MG PO TABS: 10.0000 mg | ORAL_TABLET | Freq: Every day | ORAL | 3 refills | Status: DC

## 2023-12-13 ENCOUNTER — Encounter: Payer: Self-pay | Admitting: Internal Medicine

## 2024-03-17 ENCOUNTER — Inpatient Hospital Stay
Admission: EM | Admit: 2024-03-17 | Discharge: 2024-03-19 | DRG: 065 | Disposition: A | Attending: Internal Medicine | Admitting: Internal Medicine

## 2024-03-17 ENCOUNTER — Emergency Department

## 2024-03-17 ENCOUNTER — Other Ambulatory Visit: Payer: Self-pay

## 2024-03-17 ENCOUNTER — Encounter: Payer: Self-pay | Admitting: Internal Medicine

## 2024-03-17 ENCOUNTER — Observation Stay
Admission: EM | Admit: 2024-03-17 | Discharge: 2024-03-17 | Disposition: A | Discharge status: Home / Self Care | Source: Home / Self Care | Attending: Internal Medicine | Admitting: Internal Medicine

## 2024-03-17 ENCOUNTER — Observation Stay

## 2024-03-17 DIAGNOSIS — E782 Mixed hyperlipidemia: Secondary | ICD-10-CM | POA: Diagnosis present

## 2024-03-17 DIAGNOSIS — I6523 Occlusion and stenosis of bilateral carotid arteries: Secondary | ICD-10-CM | POA: Diagnosis not present

## 2024-03-17 DIAGNOSIS — I6381 Other cerebral infarction due to occlusion or stenosis of small artery: Principal | ICD-10-CM | POA: Diagnosis present

## 2024-03-17 DIAGNOSIS — R29818 Other symptoms and signs involving the nervous system: Secondary | ICD-10-CM | POA: Diagnosis not present

## 2024-03-17 DIAGNOSIS — F17210 Nicotine dependence, cigarettes, uncomplicated: Secondary | ICD-10-CM | POA: Diagnosis present

## 2024-03-17 DIAGNOSIS — G8191 Hemiplegia, unspecified affecting right dominant side: Secondary | ICD-10-CM | POA: Diagnosis present

## 2024-03-17 DIAGNOSIS — I639 Cerebral infarction, unspecified: Secondary | ICD-10-CM | POA: Diagnosis not present

## 2024-03-17 DIAGNOSIS — K861 Other chronic pancreatitis: Secondary | ICD-10-CM | POA: Diagnosis present

## 2024-03-17 DIAGNOSIS — Z7982 Long term (current) use of aspirin: Secondary | ICD-10-CM

## 2024-03-17 DIAGNOSIS — Z7902 Long term (current) use of antithrombotics/antiplatelets: Secondary | ICD-10-CM

## 2024-03-17 DIAGNOSIS — J4520 Mild intermittent asthma, uncomplicated: Secondary | ICD-10-CM | POA: Diagnosis present

## 2024-03-17 DIAGNOSIS — R4781 Slurred speech: Secondary | ICD-10-CM | POA: Diagnosis present

## 2024-03-17 DIAGNOSIS — K860 Alcohol-induced chronic pancreatitis: Secondary | ICD-10-CM | POA: Diagnosis present

## 2024-03-17 DIAGNOSIS — E785 Hyperlipidemia, unspecified: Secondary | ICD-10-CM | POA: Diagnosis present

## 2024-03-17 DIAGNOSIS — Y906 Blood alcohol level of 120-199 mg/100 ml: Secondary | ICD-10-CM | POA: Diagnosis present

## 2024-03-17 DIAGNOSIS — I6340 Cerebral infarction due to embolism of unspecified cerebral artery: Secondary | ICD-10-CM | POA: Diagnosis not present

## 2024-03-17 DIAGNOSIS — I739 Peripheral vascular disease, unspecified: Secondary | ICD-10-CM | POA: Diagnosis not present

## 2024-03-17 DIAGNOSIS — R471 Dysarthria and anarthria: Secondary | ICD-10-CM | POA: Diagnosis present

## 2024-03-17 DIAGNOSIS — W19XXXA Unspecified fall, initial encounter: Secondary | ICD-10-CM | POA: Diagnosis not present

## 2024-03-17 DIAGNOSIS — R531 Weakness: Secondary | ICD-10-CM | POA: Diagnosis not present

## 2024-03-17 DIAGNOSIS — I10 Essential (primary) hypertension: Secondary | ICD-10-CM | POA: Diagnosis not present

## 2024-03-17 DIAGNOSIS — M47812 Spondylosis without myelopathy or radiculopathy, cervical region: Secondary | ICD-10-CM | POA: Diagnosis not present

## 2024-03-17 DIAGNOSIS — F10229 Alcohol dependence with intoxication, unspecified: Secondary | ICD-10-CM | POA: Diagnosis present

## 2024-03-17 DIAGNOSIS — R29703 NIHSS score 3: Secondary | ICD-10-CM | POA: Diagnosis present

## 2024-03-17 LAB — CBC WITH DIFFERENTIAL/PLATELET
Abs Immature Granulocytes: 0.02 K/uL (ref 0.00–0.07)
Basophils Absolute: 0 K/uL (ref 0.0–0.1)
Basophils Relative: 1 %
Eosinophils Absolute: 0.5 K/uL (ref 0.0–0.5)
Eosinophils Relative: 6 %
HCT: 40.3 % (ref 39.0–52.0)
Hemoglobin: 13.9 g/dL (ref 13.0–17.0)
Immature Granulocytes: 0 %
Lymphocytes Relative: 42 %
Lymphs Abs: 3.7 K/uL (ref 0.7–4.0)
MCH: 32.2 pg (ref 26.0–34.0)
MCHC: 34.5 g/dL (ref 30.0–36.0)
MCV: 93.3 fL (ref 80.0–100.0)
Monocytes Absolute: 0.8 K/uL (ref 0.1–1.0)
Monocytes Relative: 9 %
Neutro Abs: 3.6 K/uL (ref 1.7–7.7)
Neutrophils Relative %: 42 %
Platelets: 304 K/uL (ref 150–400)
RBC: 4.32 MIL/uL (ref 4.22–5.81)
RDW: 12.6 % (ref 11.5–15.5)
WBC: 8.6 K/uL (ref 4.0–10.5)
nRBC: 0 % (ref 0.0–0.2)

## 2024-03-17 LAB — COMPREHENSIVE METABOLIC PANEL WITH GFR
ALT: 9 U/L (ref 0–44)
AST: 22 U/L (ref 15–41)
Albumin: 4.5 g/dL (ref 3.5–5.0)
Alkaline Phosphatase: 83 U/L (ref 38–126)
Anion gap: 13 (ref 5–15)
BUN: 9 mg/dL (ref 6–20)
CO2: 25 mmol/L (ref 22–32)
Calcium: 9.3 mg/dL (ref 8.9–10.3)
Chloride: 104 mmol/L (ref 98–111)
Creatinine, Ser: 0.82 mg/dL (ref 0.61–1.24)
GFR, Estimated: >60 mL/min (ref >60)
Glucose, Bld: 114 mg/dL — ABNORMAL HIGH (ref 70–99)
Potassium: 3.5 mmol/L (ref 3.5–5.1)
Sodium: 141 mmol/L (ref 135–145)
Total Bilirubin: 0.2 mg/dL (ref 0.0–1.2)
Total Protein: 6.9 g/dL (ref 6.5–8.1)

## 2024-03-17 LAB — URINE DRUG SCREEN
Amphetamines: NEGATIVE
Amphetamines: NEGATIVE
Barbiturates: NEGATIVE
Barbiturates: NEGATIVE
Benzodiazepines: NEGATIVE
Benzodiazepines: NEGATIVE
Cocaine: NEGATIVE
Cocaine: NEGATIVE
Fentanyl: NEGATIVE
Fentanyl: NEGATIVE
Methadone Scn, Ur: NEGATIVE
Methadone Scn, Ur: NEGATIVE
Opiates: NEGATIVE
Opiates: NEGATIVE
Tetrahydrocannabinol: NEGATIVE
Tetrahydrocannabinol: NEGATIVE

## 2024-03-17 LAB — ETHANOL: Alcohol, Ethyl (B): 155 mg/dL — ABNORMAL HIGH (ref <15)

## 2024-03-17 LAB — URINALYSIS, W/ REFLEX TO CULTURE (INFECTION SUSPECTED)
Bacteria, UA: NONE SEEN
Bilirubin Urine: NEGATIVE
Glucose, UA: NEGATIVE mg/dL
Hgb urine dipstick: NEGATIVE
Ketones, ur: NEGATIVE mg/dL
Leukocytes,Ua: NEGATIVE
Nitrite: NEGATIVE
Protein, ur: NEGATIVE mg/dL
RBC / HPF: 0 RBC/hpf (ref 0–5)
Specific Gravity, Urine: 1.036 — ABNORMAL HIGH (ref 1.005–1.030)
Squamous Epithelial / HPF: 0 /HPF (ref 0–5)
pH: 7 (ref 5.0–8.0)

## 2024-03-17 LAB — PROTIME-INR
INR: 0.9 (ref 0.8–1.2)
Prothrombin Time: 12.4 s (ref 11.4–15.2)

## 2024-03-17 LAB — CBG MONITORING, ED: Glucose-Capillary: 121 mg/dL — ABNORMAL HIGH (ref 70–99)

## 2024-03-17 LAB — APTT: aPTT: 30 s (ref 24–36)

## 2024-03-17 MED ORDER — ACETAMINOPHEN 650 MG RE SUPP: 650.0000 mg | RECTAL | Status: DC | PRN

## 2024-03-17 MED ORDER — IOHEXOL 350 MG/ML SOLN
100.0000 mL | Freq: Once | INTRAVENOUS | Status: AC | PRN
  Administered 2024-03-17: 100 mL via INTRAVENOUS

## 2024-03-17 MED ORDER — STROKE: EARLY STAGES OF RECOVERY BOOK: Freq: Once | Status: AC

## 2024-03-17 MED ORDER — ASPIRIN 81 MG PO CHEW
324.0000 mg | CHEWABLE_TABLET | Freq: Once | ORAL | Status: AC
  Administered 2024-03-17: 324 mg via ORAL
  Filled 2024-03-17: qty 4

## 2024-03-17 MED ORDER — SENNOSIDES-DOCUSATE SODIUM 8.6-50 MG PO TABS: 1.0000 | ORAL_TABLET | Freq: Every evening | ORAL | Status: DC | PRN

## 2024-03-17 MED ORDER — FOLIC ACID 1 MG PO TABS
1.0000 mg | ORAL_TABLET | Freq: Every day | ORAL | Status: DC
  Administered 2024-03-17 – 2024-03-19 (×3): 1 mg via ORAL
  Filled 2024-03-17 (×3): qty 1

## 2024-03-17 MED ORDER — LORAZEPAM 2 MG/ML IJ SOLN: 1.0000 mg | INTRAMUSCULAR | Status: DC | PRN

## 2024-03-17 MED ORDER — THIAMINE MONONITRATE 100 MG PO TABS
100.0000 mg | ORAL_TABLET | Freq: Every day | ORAL | Status: DC
  Administered 2024-03-17 – 2024-03-19 (×3): 100 mg via ORAL
  Filled 2024-03-17 (×3): qty 1

## 2024-03-17 MED ORDER — ADULT MULTIVITAMIN W/MINERALS CH
1.0000 | ORAL_TABLET | Freq: Every day | ORAL | Status: DC
  Administered 2024-03-17 – 2024-03-19 (×3): 1 via ORAL
  Filled 2024-03-17 (×3): qty 1

## 2024-03-17 MED ORDER — LORAZEPAM 2 MG/ML IJ SOLN: 0.5000 mg | Freq: Four times a day (QID) | INTRAMUSCULAR | Status: DC | PRN

## 2024-03-17 MED ORDER — CLOPIDOGREL BISULFATE 75 MG PO TABS
300.0000 mg | ORAL_TABLET | Freq: Once | ORAL | Status: AC
  Administered 2024-03-17: 300 mg via ORAL
  Filled 2024-03-17: qty 4

## 2024-03-17 MED ORDER — LISINOPRIL 10 MG PO TABS: 20.0000 mg | ORAL_TABLET | Freq: Every day | ORAL | Status: DC

## 2024-03-17 MED ORDER — ACETAMINOPHEN 325 MG PO TABS
650.0000 mg | ORAL_TABLET | ORAL | Status: DC | PRN
  Administered 2024-03-17 (×2): 650 mg via ORAL
  Filled 2024-03-17 (×2): qty 2

## 2024-03-17 MED ORDER — HYDRALAZINE HCL 20 MG/ML IJ SOLN
5.0000 mg | Freq: Four times a day (QID) | INTRAMUSCULAR | Status: DC | PRN
  Administered 2024-03-17 – 2024-03-19 (×3): 5 mg via INTRAVENOUS
  Filled 2024-03-17 (×3): qty 1

## 2024-03-17 MED ORDER — CLOPIDOGREL BISULFATE 75 MG PO TABS
75.0000 mg | ORAL_TABLET | Freq: Every day | ORAL | Status: DC
  Administered 2024-03-18 – 2024-03-19 (×2): 75 mg via ORAL
  Filled 2024-03-17 (×2): qty 1

## 2024-03-17 MED ORDER — ENOXAPARIN SODIUM 40 MG/0.4ML IJ SOSY
40.0000 mg | PREFILLED_SYRINGE | INTRAMUSCULAR | Status: DC
  Administered 2024-03-17 – 2024-03-18 (×2): 40 mg via SUBCUTANEOUS
  Filled 2024-03-17 (×2): qty 0.4

## 2024-03-17 MED ORDER — LORAZEPAM 1 MG PO TABS: 1.0000 mg | ORAL_TABLET | ORAL | Status: DC | PRN

## 2024-03-17 MED ORDER — THIAMINE HCL 100 MG/ML IJ SOLN: 100.0000 mg | Freq: Every day | INTRAMUSCULAR | Status: DC

## 2024-03-17 MED ORDER — ROSUVASTATIN CALCIUM 10 MG PO TABS
10.0000 mg | ORAL_TABLET | Freq: Every evening | ORAL | Status: DC
  Administered 2024-03-17 – 2024-03-18 (×2): 10 mg via ORAL
  Filled 2024-03-17 (×3): qty 1

## 2024-03-17 MED ORDER — SODIUM CHLORIDE 0.9 % IV SOLN: INTRAVENOUS | Status: DC

## 2024-03-17 MED ORDER — ACETAMINOPHEN 160 MG/5ML PO SOLN: 650.0000 mg | ORAL | Status: DC | PRN

## 2024-03-17 MED FILL — Aspirin Tab Delayed Release 81 MG: 81.0000 mg | ORAL | Qty: 1 | Status: AC

## 2024-03-17 NOTE — ED Triage Notes (Signed)
 Patient to ED via [facility] from home. Patient presents with R sided weakness and slurred speech. Last night around 1700, patient drank 2 mixed drinks which patient states he does every day. Patient went to be around 2200 without complaints. When patient woke up around 0100, he had slurred speech and R sided weakness. Patient states he fell 2 times in his bathroom. Patient denies hitting his head or LOC. Patient is not on blood thinners. Patient's wife states patient has had an unsteady gait for 2 days. Patient does take medications for hypertension.

## 2024-03-17 NOTE — ED Notes (Signed)
 03/17/24 at [employer] spoke with Ruby to activate code stroke/ LNW 0100/ Slurred speech, Rt sided weakness/ B/P 147/90

## 2024-03-17 NOTE — Consult Note (Signed)
 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 The issue here is we are not sure when it started, as the Isaac Meza has been ataxic for 2 days. It is likely that it started 2 days ago and worsened today with completion. Another possibility would be a first stroke 2 days ago then another one last night. In both cases this would contraindicate TNK (out of the time window in the first case, and recent stroke in the second). His symptoms are also mild with an NIHSS of only 2 at this time. Based on that, the risks of thrombolysis outweigh its benefits, and dual antiplatelets are felt to be a better option. The Isaac Meza and his wife agree with this assessment.  CTAs showed no LVO so no thrombectomy is indicated.  Recommend admission with stroke work up including MRI brain, 2D echo.  Recommend dual antiplatelet therapy.  Recommend risk stratification labs with lipid profile, HbA1c. Recommend strict BP and sugar control. Recommend statin.  Discussed the importance of alcohol limitation and smoking cessation for stroke prevention.  Needs inpatient Neurology follow up for further recommendations after these tests are done.  Our recommendations are outlined below.  Recommendations:        Stroke/Telemetry Floor       Neuro Checks (Q4)       Bedside Swallow Eval       DVT Prophylaxis       IV Fluids, Normal Saline       Head of Bed 30 Degrees       Euglycemia and Avoid Hyperthermia (PRN Acetaminophen )       Bolus with Clopidogrel 300 mg bolus x1 and initiate dual antiplatelet therapy with Aspirin 81 mg daily and Clopidogrel 75 mg daily  Sign Out:       Discussed with  Emergency Department Provider    ------------------------------------------------------------------------------  Advanced Imaging: CTA Head and Neck Completed.  LVO:No  Isaac Meza is not a candidate for NIR   Metrics: Last Known Well: Unknown Arrival Time: 03/17/2024 01:03:00 Activation Time: 03/17/2024 01:23:00 Initial Response Time: 03/17/2024 01:31:11 Symptoms: Slurred speech and right sided weakness. Initial Isaac Meza interaction: 03/17/2024 01:37:01 NIHSS Assessment Completed: 03/17/2024 01:43:00 Isaac Meza is not a candidate for Thrombolytic. Thrombolytic Medical Decision: 03/17/2024 01:50:00 Isaac Meza was not deemed candidate for Thrombolytic because of following reasons: LKW outside 4.5 hr window. .  CT Head: I personally reviewed all the CT images that were available to me and it showed: no acute pathology, but lacunar infarcts (chronic? seen).  Primary Provider Notified of Diagnostic Impression and Management Plan on: 03/17/2024 02:01:35    ------------------------------------------------------------------------------  History of Present Illness: Isaac Meza is a 53 year old Male.  Isaac Meza was brought by EMS for symptoms of Slurred speech and right sided weakness. 53 yo male with a history of HTN and heavy drinking who was brought in as a stroke alert with slurred speech and right sided weakness. The Isaac Meza's wife was able to give us  the history. Apparently the Isaac Meza has been having imbalance for 2 days which is not normal for him. His wife told him to see a doctor but he did not. Last night he went to bed  at 10pm with balance issues but no other symptoms. When he woke up he was slurring his words and exhibiting right sided weakness, which caused a fall. They called 911 and when EMS came in, they saw his right sided weakness was fluctuating but he was still slurring his words. A code stroke was therefore activated.    Past Medical History:       Hypertension  Medications:  No Anticoagulant use  No Antiplatelet use Reviewed EMR for current medications  Allergies:  Reviewed  Social History: Smoking: Yes Alcohol Use: Yes Drug Use: No  Family History:  There is no family history of premature cerebrovascular disease pertinent to this consultation  ROS : 14 Points Review of Systems was performed and was negative except mentioned in HPI.  Past Surgical History: There Is No Surgical History Contributory To Todays Visit     Examination: BP(147/90), Pulse(84), Blood Glucose(121) 1A: Level of Consciousness - Alert; keenly responsive + 0 1B: Ask Month and Age - Both Questions Right + 0 1C: Blink Eyes & Squeeze Hands - Performs Both Tasks + 0 2: Test Horizontal Extraocular Movements - Normal + 0 3: Test Visual Fields - No Visual Loss + 0 4: Test Facial Palsy (Use Grimace if Obtunded) - Normal symmetry + 0 5A: Test Left Arm Motor Drift - No Drift for 10 Seconds + 0 5B: Test Right Arm Motor Drift - Drift, but doesn't hit bed + 1 6A: Test Left Leg Motor Drift - No Drift for 5 Seconds + 0 6B: Test Right Leg Motor Drift - No Drift for 5 Seconds + 0 7: Test Limb Ataxia (FNF/Heel-Shin) - No Ataxia + 0 8: Test Sensation - Normal; No sensory loss + 0 9: Test Language/Aphasia - Normal; No aphasia + 0 10: Test Dysarthria - Mild-Moderate Dysarthria: Slurring but can be understood + 1 11: Test Extinction/Inattention - No abnormality + 0  NIHSS Score: 2   Pre-Morbid Modified Rankin Scale: 0 Points = No symptoms at all  Spoke with : Dr Ester Sharps I reviewed the available imaging via [facility] and initiated discussion with the primary provider  This consult was conducted in real time using interactive audio and video technology. Isaac Meza was informed of the technology being used for this visit and agreed to proceed. Isaac Meza located in hospital and provider located at home/office setting.   Isaac Meza is being evaluated for possible  acute neurologic impairment and high probability of imminent or life-threatening deterioration. I spent total of 55 minutes providing care to this Isaac Meza, including time for face to face visit via telemedicine, review of medical records, imaging studies and discussion of findings with providers, the Isaac Meza and/or family.    Dr Cynthia Newness   [facility] For Inpatient follow-up with [facility] physician please call [facility] at [phone]. As we are not an outpatient service for any post hospital discharge needs please contact the hospital for assistance. If you have any questions for the [facility] physicians or need to reconsult for clinical or diagnostic changes please contact us  via [facility] at [phone].  Non-radiologist review of imaging performed to assist with emergent clinical decision-making. Remote physician workstations do not possess the same resolution, calibration, or diagnostic capabilities as hospital-based radiology reading stations, and formal radiologist read is necessary.   Signature : Deisha Stull

## 2024-03-17 NOTE — Progress Notes (Signed)
 OT Cancellation Note  Patient Details Name: Isaac Meza MRN: [id number] DOB: [birth year]   Cancelled Treatment:    Reason Eval/Treat Not Completed: OT screened, no needs identified, will sign off;Other (comment) (per PT, patient back at baseline. OT introduced self to patient, confirmed that patient back at baseline and has no OT needs. Patient agreeable, OT to sign off.)  Maryelizabeth CHRISTELLA Clause 03/17/2024, 10:48 AM

## 2024-03-17 NOTE — TOC Initial Note (Signed)
 Transition of Care [facility] - Progression Note    Patient Details  Name: Isaac Meza MRN: [id number] Date of Birth: 03-24-1971  [facility]) CM/SW Contact  Tomie Spizzirri L Trellis [place], [place] Phone Number: 03/17/2024, 11:14 AM  Clinical Narrative:     [facility] consult received for substance abuse education/counseling. TOC does not provide substance abuse education/counseling. Resources added to the AVS.                     Expected Discharge Plan and Services                                               Social Drivers of Health (SDOH) Interventions SDOH Screenings   Food Insecurity: No Food Insecurity (12/06/2022)  Housing: Low Risk (12/06/2022)  Transportation Needs: No Transportation Needs (12/06/2022)  Utilities: Not At Risk (12/06/2022)  Depression (PHQ2-9): Low Risk (05/21/2023)  Tobacco Use: High Risk (11/24/2023)    Readmission Risk Interventions     No data to display

## 2024-03-17 NOTE — Progress Notes (Signed)
 SLP Cancellation Note  Patient Details Name: Isaac Meza MRN: [id number] DOB: 20-Apr-1970   Cancelled treatment:       Reason Eval/Treat Not Completed: SLP screened, no needs identified, will sign off  Pt seen for motor speech screen in the setting of acute CVA. Pt presenting with slurred speech upon admission, though pt/spouse report significant improvement in speech. Pt reporting that speech is nearing baseline. Education shared with pt/spouse for monitoring speech and reaching out for speech consult if acute worsening. All reported understanding across education. Given significant improvement and report of approaching baseline, no further acute SLP services indicated at this time.   Imani Sherrin Clapp, MS, CCC-SLP Speech Language Pathologist Rehab Services; [facility] [phone] (ascom)   Antron Seth J Clapp 03/17/2024, 11:42 AM

## 2024-03-17 NOTE — H&P (Signed)
 History and Physical    Isaac Meza FMW:[id number] DOB: 05-Aug-1970 DOA: 03/17/2024  PCP: [name], DO (Confirm with patient/family/NH records and if not entered, this has to be entered at [facility] point of entry) Patient coming from: Home  I have personally briefly reviewed patient's old medical records in [facility]  Chief Complaint: Right sided weakness  HPI: Isaac Meza is a 53 y.o. male with medical history significant of mild intermittent asthma, HTN, presented with strokelike symptoms.  Symptoms started yesterday evening around 1700, when patient started to have weakness of right-sided weakness and slurred speech.  Before that patient drank 2 mixed liquor cocktail as his routine.  Patient went to bed as usual around 2200 and woke up 1 AM and persistently to have slurred speech and right-sided weakness and fell while walking to bathroom.  He denied any LOC or head or neck injury.  This morning, patient reported significant improvement of right-sided strength as well as his speech. ED Course: Afebrile, blood pressure 150/90 O2 saturation 97% on room air.  Code stroke CALLED, CT head negative for acute findings CTA negative for LVO.  UDS negative.  Patient was evaluated by teleneurology, and was considered to be a noncandidate for TNK therapy.  Patient was loaded with aspirin and Plavix in the ED.  Review of Systems: As per HPI otherwise 14 point review of systems negative.    Past Medical History:  Diagnosis Date   Asthma    childhood   Hypertension     Past Surgical History:  Procedure Laterality Date   APPENDECTOMY  2024   BACK SURGERY     ESOPHAGOGASTRODUODENOSCOPY (EGD) WITH PROPOFOL  N/A 08/01/2022   Procedure: ESOPHAGOGASTRODUODENOSCOPY (EGD) WITH PROPOFOL ;  Surgeon: Rollin Dover, MD;  Location: [facility] ENDOSCOPY;  Service: Gastroenterology;  Laterality: N/A;   EUS N/A 08/01/2022   Procedure: UPPER ENDOSCOPIC ULTRASOUND (EUS) LINEAR;  Surgeon: Rollin Dover, MD;  Location: [facility] ENDOSCOPY;  Service: Gastroenterology;  Laterality: N/A;   FINE NEEDLE ASPIRATION  08/01/2022   Procedure: FINE NEEDLE ASPIRATION (FNA) LINEAR;  Surgeon: Rollin Dover, MD;  Location: [facility] ENDOSCOPY;  Service: Gastroenterology;;   FRACTURE SURGERY Right 2007   arm   XI ROBOTIC LAPAROSCOPIC ASSISTED APPENDECTOMY N/A 05/16/2022   Procedure: XI ROBOTIC LAPAROSCOPIC ASSISTED APPENDECTOMY;  Surgeon: Desiderio Schanz, MD;  Location: [facility] ORS;  Service: General;  Laterality: N/A;     reports that he has been smoking cigarettes. He started smoking about 40 years ago. He has a 20.3 pack-year smoking history. He has never used smokeless tobacco. He reports current alcohol use of about 21.0 standard drinks of alcohol per week. He reports that he does not currently use drugs.  Allergies[1]  No family history on file.   Prior to Admission medications  Medication Sig Start Date End Date Taking? Authorizing Provider  lisinopril  (ZESTRIL ) 20 MG tablet Take 1 tablet (20 mg total) by mouth daily. 11/24/23  Yes [name], DO  OVER THE COUNTER MEDICATION Take 1 tablet by mouth as directed. OTC PAIN MEDICATION   Yes [provider]  rosuvastatin  (CRESTOR ) 10 MG tablet Take 1 tablet (10 mg total) by mouth daily. 11/26/23  Yes [name], DO    Physical Exam: Vitals:   03/17/24 0530 03/17/24 0600 03/17/24 0648 03/17/24 0700  BP:  (!) 144/96  (!) 157/110  Pulse: 71 70  75  Resp: 13 13  18   Temp:   98 F (36.7 C)   TempSrc:   Oral   SpO2: 97%  99%  99%  Weight:      Height:        Constitutional: NAD, calm, comfortable Vitals:   03/17/24 0530 03/17/24 0600 03/17/24 0648 03/17/24 0700  BP:  (!) 144/96  (!) 157/110  Pulse: 71 70  75  Resp: 13 13  18   Temp:   98 F (36.7 C)   TempSrc:   Oral   SpO2: 97% 99%  99%  Weight:      Height:       Eyes: PERRL, lids and conjunctivae normal ENMT: Mucous membranes are moist. Posterior pharynx clear of any  exudate or lesions.Normal dentition.  Neck: normal, supple, no masses, no thyromegaly Respiratory: clear to auscultation bilaterally, no wheezing, no crackles. Normal respiratory effort. No accessory muscle use.  Cardiovascular: Regular rate and rhythm, no murmurs / rubs / gallops. No extremity edema. 2+ pedal pulses. No carotid bruits.  Abdomen: no tenderness, no masses palpated. No hepatosplenomegaly. Bowel sounds positive.  Musculoskeletal: no clubbing / cyanosis. No joint deformity upper and lower extremities. Good ROM, no contractures. Normal muscle tone.  Skin: no rashes, lesions, ulcers. No induration Neurologic: CN 2-12 grossly intact. Sensation intact, DTR normal. Strength 4/5 on the right side of upper and lower limbs compared to left.  Psychiatric: Normal judgment and insight. Alert and oriented x 3. Normal mood.  )  Labs on Admission: I have personally reviewed following labs and imaging studies  CBC: Recent Labs  Lab 03/17/24 0120  WBC 8.6  NEUTROABS 3.6  HGB 13.9  HCT 40.3  MCV 93.3  PLT 304   Basic Metabolic Panel: Recent Labs  Lab 03/17/24 0120  NA 141  K 3.5  CL 104  CO2 25  GLUCOSE 114*  BUN 9  CREATININE 0.82  CALCIUM  9.3   GFR: Estimated Creatinine Clearance: 88.9 mL/min (by C-G formula based on SCr of 0.82 mg/dL). Liver Function Tests: Recent Labs  Lab 03/17/24 0120  AST 22  ALT 9  ALKPHOS 83  BILITOT 0.2  PROT 6.9  ALBUMIN 4.5   No results for input(s): LIPASE, AMYLASE in the last 168 hours. No results for input(s): AMMONIA in the last 168 hours. Coagulation Profile: Recent Labs  Lab 03/17/24 0120  INR 0.9   Cardiac Enzymes: No results for input(s): CKTOTAL, CKMB, CKMBINDEX, TROPONINI in the last 168 hours. BNP (last 3 results) No results for input(s): PROBNP in the last 8760 hours. HbA1C: No results for input(s): HGBA1C in the last 72 hours. CBG: Recent Labs  Lab 03/17/24 0120  GLUCAP 121*   Lipid  Profile: No results for input(s): CHOL, HDL, LDLCALC, TRIG, CHOLHDL, LDLDIRECT in the last 72 hours. Thyroid Function Tests: No results for input(s): TSH, T4TOTAL, FREET4, T3FREE, THYROIDAB in the last 72 hours. Anemia Panel: No results for input(s): VITAMINB12, FOLATE, FERRITIN, TIBC, IRON, RETICCTPCT in the last 72 hours. Urine analysis:    Component Value Date/Time   COLORURINE YELLOW (A) 07/27/2022 1705   APPEARANCEUR CLEAR (A) 07/27/2022 1705   LABSPEC >1.046 (H) 07/27/2022 1705   PHURINE 6.0 07/27/2022 1705   GLUCOSEU NEGATIVE 07/27/2022 1705   HGBUR NEGATIVE 07/27/2022 1705   BILIRUBINUR NEGATIVE 07/27/2022 1705   KETONESUR 80 (A) 07/27/2022 1705   PROTEINUR NEGATIVE 07/27/2022 1705   NITRITE NEGATIVE 07/27/2022 1705   LEUKOCYTESUR NEGATIVE 07/27/2022 1705    Radiological Exams on Admission: MR BRAIN WO CONTRAST Result Date: 03/17/2024 EXAM: MR Brain without Intravenous Contrast. CLINICAL HISTORY: 53 year old male with acute neurological deficit, stroke code presentation this  morning. TECHNIQUE: Magnetic resonance images of the brain without intravenous contrast in multiple planes. CONTRAST: Without. COMPARISON: CT head, CTA, and CTP earlier today, 03/17/2024. FINDINGS: BRAIN: Subtle heterogeneous abnormal diffusion in the left corona radiata, tracking toward the posterior left lentiform (series 5 images 29 through 30) with punctate foci of restriction. No associated hemorrhage or mass effect. T2 heterogeneity in the bilateral deep gray nuclei compatible with multiple chronic lacunar infarcts, including thalamic involvement as seen on series 17 image 85. Small area of chronic left periventricular white matter lacunar infarction or encephalomalacia suspected on series 8 image 17. No cortical encephalomalacia or chronic cerebral blood products identified. Brainstem and cerebellum appear negative. No intracranial hemorrhage. No midline shift or  extra-axial fluid collection. No cerebellar tonsillar ectopia. The central arterial and venous flow voids are patent. VENTRICLES: No hydrocephalus. ORBITS: The orbits are normal. SINUSES AND MASTOIDS: Bilateral paranasal sinuses mucosal thickening and opacification is stable. Small volume retained secretions in the nasopharynx on series 8 image 4. Mastoids are well aerated. Grossly normal visible internal auditory structures. BONES: Background bone marrow signal within normal limits. Exaggerated cervical lordosis redemonstrated. Associated cervical facet arthropathy better demonstrated by CT. No acute fracture or focal osseous lesion. IMPRESSION: 1. Patchy acute small vessel type infarcts in the left corona radiata, tracking toward the posterior left lentiform. No associated hemorrhage or mass effect. 2. 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 Electronically signed by: Helayne Hurst MD 03/17/2024 09:23 AM EST RP Workstation: [id number]   CT ANGIO HEAD NECK W WO CM W PERF (CODE STROKE) Result Date: 03/17/2024 EXAM: CTA Head and Neck with Perfusion 03/17/2024 02:16:07 AM TECHNIQUE: CTA of the head and neck was performed with the administration of intravenous contrast. 3D postprocessing with multiplanar reconstructions and MIPs was performed to evaluate the vascular anatomy. Cerebral perfusion analysis using computed tomography with contrast administration, including post-processing of parametric maps with determination of cerebral blood flow, cerebral blood volume, mean transit time and time-to-maximum. Automated exposure control, iterative reconstruction, and/or weight based adjustment of the mA/kV was utilized to reduce the radiation dose to as low as reasonably achievable. COMPARISON: None available CLINICAL HISTORY: Neuro deficit, acute, stroke suspected Neuro deficit, acute, stroke suspected FINDINGS: AORTIC ARCH AND ARCH VESSELS: No dissection or  arterial injury. No significant stenosis of the brachiocephalic or subclavian arteries. CERVICAL CAROTID ARTERIES: No dissection, arterial injury, or hemodynamically significant stenosis by NASCET criteria. CERVICAL VERTEBRAL ARTERIES: No dissection, arterial injury, or significant stenosis. LUNGS AND MEDIASTINUM: Unremarkable. SOFT TISSUES: No acute abnormality. BONES: No acute abnormality. ANTERIOR CIRCULATION: Internal carotid arteries are patent with moderate bilateral paraclinoid stenosis. No significant stenosis of the anterior cerebral arteries. No significant stenosis of the middle cerebral arteries. No aneurysm. POSTERIOR CIRCULATION: No significant stenosis of the posterior cerebral arteries. No significant stenosis of the basilar artery. No significant stenosis of the vertebral arteries. No aneurysm. OTHER: No dural venous sinus thrombosis on this non-dedicated study. EXAM QUALITY: Exam quality is adequate with diagnostic perfusion maps. No significant motion artifact. Appropriate arterial inflow and venous outflow curves. CORE INFARCT (CBF<30% volume): 0 mL TOTAL HYPOPERFUSION (Tmax>6s volume): 0 mL Mismatch volume: 0 mL Mismatch ratio: not applicable Location: not applicable IMPRESSION: 1. No acute large vessel occlusion. 2. Moderate bilateral paraclinoid ICA stenosis. 3. No evidence of core infarct or penumbra on CT perfusion. Electronically signed by: Gilmore Molt MD 03/17/2024 02:57 AM EST RP Workstation: [id number]   CT CERVICAL SPINE WO CONTRAST Result Date:  03/17/2024 EXAM: CT CERVICAL SPINE WITHOUT CONTRAST 03/17/2024 02:06:06 AM TECHNIQUE: CT of the cervical spine was performed without the administration of intravenous contrast. Multiplanar reformatted images are provided for review. Automated exposure control, iterative reconstruction, and/or weight based adjustment of the mA/kV was utilized to reduce the radiation dose to as low as reasonably achievable. COMPARISON: None available.  CLINICAL HISTORY: Neuro deficit, acute, stroke suspected. FINDINGS: CERVICAL SPINE: BONES AND ALIGNMENT: No acute fracture or traumatic malalignment. DEGENERATIVE CHANGES: Advanced bilateral degenerative facet disease diffusely. SOFT TISSUES: No prevertebral soft tissue swelling. IMPRESSION: 1. No acute abnormality of the cervical spine. 2. Advanced bilateral degenerative facet disease. Electronically signed by: Franky Crease MD 03/17/2024 02:22 AM EST RP Workstation: [id number]   CT HEAD CODE STROKE WO CONTRAST` Result Date: 03/17/2024 EXAM: CT HEAD WITHOUT 03/17/2024 02:02:17 AM TECHNIQUE: CT of the head was performed without the administration of intravenous contrast. Automated exposure control, iterative reconstruction, and/or weight based adjustment of the mA/kV was utilized to reduce the radiation dose to as low as reasonably achievable. COMPARISON: None available. CLINICAL HISTORY: Neuro deficit, acute, stroke suspected FINDINGS: BRAIN AND VENTRICLES: No acute intracranial hemorrhage. No mass effect or midline shift. No extra-axial fluid collection. No evidence of acute infarct. Small remote right thalamic and bilateral corona radiata lacunar infarcts. Patchy white matter hypodensities, compatible with chronic microvascular ischemic disease. No hydrocephalus. ORBITS: No acute abnormality. SINUSES AND MASTOIDS: No acute abnormality. SOFT TISSUES AND SKULL: No acute skull fracture. Findings discussed with Dr. Claudene via telephone at 2:08 PM. IMPRESSION: 1. No acute intracranial abnormality.  ASPECTS 10. 2. Small remote right thalamic and bilateral corona radiata lacunar infarcts. Electronically signed by: Gilmore Molt MD 03/17/2024 02:10 AM EST RP Workstation: [id number]    EKG: Ordered Assessment/Plan Principal Problem:   CVA (cerebral vascular accident) [facility]) Active Problems:   Stroke [facility])  (please populate well all problems here in Problem List. (For example, if patient is on BP meds at home  and you resume or decide to hold them, it is a problem that needs to be her. Same for CAD, COPD, HLD and so on)  Right-sided paresis Acute dysarthria - Likely acute CVA. - Brain MRI - Speech and PT OT evaluation - Allow permissive hypertension, as needed hydralazine  for blood pressure 200/110 - Neurology consultation appreciated, will continue aspirin and Plavix regimen - Risk factor modification, check A1c and lipid panel.  UDS negative.  Alcohol abuse Alcohol intoxication -Alcohol level was 150 last night on arrival, but mentation was at baseline at this point - Currently there is no symptoms or signs of alcohol withdrawal - Start CIWA protocol with as needed benzos  HTN HLD - As above.  DVT prophylaxis: Lovenox  Code Status: Full code Family Communication: Wife at bedside Disposition Plan: Expect less than 2 midnight hospital stay Consults called: Neurology Admission status: Telemetry observation   Cort ONEIDA Mana MD [facility] Pager [phone]  03/17/2024, 9:47 AM       [1] No Known Allergies

## 2024-03-17 NOTE — Plan of Care (Signed)

## 2024-03-17 NOTE — ED Provider Notes (Signed)
 [facility] Provider Note    Event Date/Time   First MD Initiated Contact with Patient 03/17/24 0155     (approximate)   History   Code Stroke   HPI  Isaac Meza is a 53 y.o. male who presents to the ED for evaluation of Code Stroke   Patient with history of alcoholism presents to the ED with acute right-sided weakness and slurred speech.  Patient fell twice on the way to the bathroom due to right-sided weakness tonight, which precipitated call to EMS.  Reportedly severe weakness on the right side at home that resolved with EMS before arrival.  On arrival to the ED patient reports sensation of worsening weakness again so activate code stroke protocols   Physical Exam   Triage Vital Signs: ED Triage Vitals  Encounter Vitals Group     BP      Girls Systolic BP Percentile      Girls Diastolic BP Percentile      Boys Systolic BP Percentile      Boys Diastolic BP Percentile      Pulse      Resp      Temp      Temp src      SpO2      Weight      Height      Head Circumference      Peak Flow      Pain Score      Pain Loc      Pain Education      Exclude from Growth Chart     Most recent vital signs: Vitals:   03/17/24 0120 03/17/24 0209  BP: (!) 147/90   Pulse: 84   Resp: 16   Temp: 98.4 F (36.9 C)   SpO2: 98% 98%    General: Awake, no distress.  CV:  Good peripheral perfusion.  Resp:  Normal effort.  Abd:  No distention.  MSK:  No deformity noted.  No signs of trauma Neuro:  NIH of 3, right arm, right leg and dysarthria.  Sensation intact.   Other:     ED Results / Procedures / Treatments   Labs (all labs ordered are listed, but only abnormal results are displayed) Labs Reviewed  ETHANOL - Abnormal; Notable for the following components:      Result Value   Alcohol, Ethyl (B) 155 (*)    All other components within normal limits  COMPREHENSIVE METABOLIC PANEL WITH GFR - Abnormal; Notable for the following components:    Glucose, Bld 114 (*)    All other components within normal limits  CBC WITH DIFFERENTIAL/PLATELET  PROTIME-INR  APTT  CBC  DIFFERENTIAL  COMPREHENSIVE METABOLIC PANEL WITH GFR  URINE DRUG SCREEN  ETHANOL  URINALYSIS, W/ REFLEX TO CULTURE (INFECTION SUSPECTED)  URINE DRUG SCREEN    EKG Poor quality EKG seems to demonstrate a sinus rhythm with a rate of 86 bpm.  No STEMI  RADIOLOGY CT head interpreted by me without evidence of acute intracranial pathology CT cervical spine interpreted by me without evidence of fracture or dislocation  Official radiology report(s): CT CERVICAL SPINE WO CONTRAST Result Date: 03/17/2024 EXAM: CT CERVICAL SPINE WITHOUT CONTRAST 03/17/2024 02:06:06 AM TECHNIQUE: CT of the cervical spine was performed without the administration of intravenous contrast. Multiplanar reformatted images are provided for review. Automated exposure control, iterative reconstruction, and/or weight based adjustment of the mA/kV was utilized to reduce the radiation dose to as low as reasonably achievable. COMPARISON: None  available. CLINICAL HISTORY: Neuro deficit, acute, stroke suspected. FINDINGS: CERVICAL SPINE: BONES AND ALIGNMENT: No acute fracture or traumatic malalignment. DEGENERATIVE CHANGES: Advanced bilateral degenerative facet disease diffusely. SOFT TISSUES: No prevertebral soft tissue swelling. IMPRESSION: 1. No acute abnormality of the cervical spine. 2. Advanced bilateral degenerative facet disease. Electronically signed by: Franky Crease MD 03/17/2024 02:22 AM EST RP Workstation: [id number]   CT HEAD CODE STROKE WO CONTRAST` Result Date: 03/17/2024 EXAM: CT HEAD WITHOUT 03/17/2024 02:02:17 AM TECHNIQUE: CT of the head was performed without the administration of intravenous contrast. Automated exposure control, iterative reconstruction, and/or weight based adjustment of the mA/kV was utilized to reduce the radiation dose to as low as reasonably achievable. COMPARISON: None  available. CLINICAL HISTORY: Neuro deficit, acute, stroke suspected FINDINGS: BRAIN AND VENTRICLES: No acute intracranial hemorrhage. No mass effect or midline shift. No extra-axial fluid collection. No evidence of acute infarct. Small remote right thalamic and bilateral corona radiata lacunar infarcts. Patchy white matter hypodensities, compatible with chronic microvascular ischemic disease. No hydrocephalus. ORBITS: No acute abnormality. SINUSES AND MASTOIDS: No acute abnormality. SOFT TISSUES AND SKULL: No acute skull fracture. Findings discussed with Dr. Claudene via telephone at 2:08 PM. IMPRESSION: 1. No acute intracranial abnormality.  ASPECTS 10. 2. Small remote right thalamic and bilateral corona radiata lacunar infarcts. Electronically signed by: Gilmore Molt MD 03/17/2024 02:10 AM EST RP Workstation: [id number]    PROCEDURES and INTERVENTIONS:  .Critical Care  Performed by: Claudene Rover, MD Authorized by: Claudene Rover, MD   Critical care provider statement:    Critical care time (minutes):  30   Critical care time was exclusive of:  Separately billable procedures and treating other patients   Critical care was necessary to treat or prevent imminent or life-threatening deterioration of the following conditions:  CNS failure or compromise   Critical care was time spent personally by me on the following activities:  Development of treatment plan with patient or surrogate, discussions with consultants, evaluation of patient's response to treatment, examination of patient, ordering and review of laboratory studies, ordering and review of radiographic studies, ordering and performing treatments and interventions, pulse oximetry, re-evaluation of patient's condition and review of old charts .1-3 Lead EKG Interpretation  Performed by: Claudene Rover, MD Authorized by: Claudene Rover, MD     Interpretation: normal     ECG rate:  80   ECG rate assessment: normal     Rhythm: sinus rhythm      Ectopy: none     Conduction: normal     Medications  clopidogrel (PLAVIX) tablet 300 mg (300 mg Oral Given 03/17/24 0222)  aspirin chewable tablet 324 mg (324 mg Oral Given 03/17/24 0221)  iohexol  (OMNIPAQUE ) 350 MG/ML injection 100 mL (100 mLs Intravenous Contrast Given 03/17/24 0206)     IMPRESSION / MDM / ASSESSMENT AND PLAN / ED COURSE  I reviewed the triage vital signs and the nursing notes.  Differential diagnosis includes, but is not limited to, TIA, stroke, alcohol intoxication, seizure, ICH  {Patient presents with symptoms of an acute illness or injury that is potentially life-threatening.  Patient presents with right-sided weakness concerning for an acute stroke.  Do activate code stroke protocols on arrival due to initial reports of symptoms just in the past hour, though does sound like he has had some further symptoms over the past 2 days.   NIH of 3 for me with right sided weakness to the arm, leg and dysarthria.  No significant signs of trauma.  No ICH on  CT head.  Emergent consult with teleneurology.  Load with aspirin and Plavix, consult with medicine for admission  Clinical Course as of 03/17/24 0228  Thu Mar 17, 2024  0140 Talked to wife at the bedside.  [DS]  0201 I discussed with teleneurology.  Due to 2 days of unsteady gait, concern for a preceding stroke or actively evolving stroke over the sort of timeframe and therefore contraindicated TNK administration. He does recommend loading with asa and plavix, which I order [DS]    Clinical Course User Index [DS] Claudene Rover, MD     FINAL CLINICAL IMPRESSION(S) / ED DIAGNOSES   Final diagnoses:  Cerebrovascular accident (CVA), unspecified mechanism (HCC)     Rx / DC Orders   ED Discharge Orders     None        Note:  This document was prepared using Dragon voice recognition software and may include unintentional dictation errors.   Claudene Rover, MD 03/17/24 0230

## 2024-03-17 NOTE — Consult Note (Signed)
 Reason for Consult:Acute infarct Requesting Physician: Laurita  CC: Right sided weakness, slurred speech  I have been asked by Dr. Laurita to see this patient in consultation for Acute infarct.  HPI: Isaac Meza is an 53 y.o. male with medical history significant of asthma and HTN who presented with acute onset right sided weakness and slurred speech.  Patient actually had onset of ataxic gait about 2 days prior to presentation.  Then on yesterday developed the right sided symptoms.  Right sided symptoms resolved spontaneously after about 30 minutes. Was seen in the ED as a code stroke.  NIHSS of 2.  Was not a thrombolytic or thrombectomy candidate due to being outside of the treatment window.  Past Medical History:  Diagnosis Date   Asthma    childhood   Hypertension     Past Surgical History:  Procedure Laterality Date   APPENDECTOMY  2024   BACK SURGERY     ESOPHAGOGASTRODUODENOSCOPY (EGD) WITH PROPOFOL  N/A 08/01/2022   Procedure: ESOPHAGOGASTRODUODENOSCOPY (EGD) WITH PROPOFOL ;  Surgeon: Rollin Dover, MD;  Location: [facility] ENDOSCOPY;  Service: Gastroenterology;  Laterality: N/A;   EUS N/A 08/01/2022   Procedure: UPPER ENDOSCOPIC ULTRASOUND (EUS) LINEAR;  Surgeon: Rollin Dover, MD;  Location: [facility] ENDOSCOPY;  Service: Gastroenterology;  Laterality: N/A;   FINE NEEDLE ASPIRATION  08/01/2022   Procedure: FINE NEEDLE ASPIRATION (FNA) LINEAR;  Surgeon: Rollin Dover, MD;  Location: [facility] ENDOSCOPY;  Service: Gastroenterology;;   FRACTURE SURGERY Right 2007   arm   XI ROBOTIC LAPAROSCOPIC ASSISTED APPENDECTOMY N/A 05/16/2022   Procedure: XI ROBOTIC LAPAROSCOPIC ASSISTED APPENDECTOMY;  Surgeon: Desiderio Schanz, MD;  Location: [facility] ORS;  Service: General;  Laterality: N/A;    No family history on file.  Social History:  reports that he has been smoking cigarettes. He started smoking about 40 years ago. He has a 20.3 pack-year smoking history. He has never used smokeless tobacco. He reports  current alcohol use of about 21.0 standard drinks of alcohol per week. He reports that he does not currently use drugs.  Allergies[1]  Medications: I have reviewed the patient's current medications. Prior to Admission:  Prior to Admission medications  Medication Sig Start Date End Date Taking? Authorizing Provider  lisinopril  (ZESTRIL ) 20 MG tablet Take 1 tablet (20 mg total) by mouth daily. 11/24/23  Yes [name], DO  OVER THE COUNTER MEDICATION Take 1 tablet by mouth as directed. OTC PAIN MEDICATION   Yes [provider]  rosuvastatin  (CRESTOR ) 10 MG tablet Take 1 tablet (10 mg total) by mouth daily. 11/26/23  Yes [name], DO    Scheduled:  NOREEN ON 03/18/2024]  stroke: early stages of recovery book   Does not apply Once   [START ON 03/18/2024] aspirin EC  81 mg Oral Daily   [START ON 03/18/2024] clopidogrel  75 mg Oral Daily   enoxaparin  (LOVENOX ) injection  40 mg Subcutaneous Q24H   folic acid   1 mg Oral Daily   multivitamin with minerals  1 tablet Oral Daily   rosuvastatin   10 mg Oral QPM   thiamine   100 mg Oral Daily   Or   thiamine   100 mg Intravenous Daily    ROS: History obtained from the patient  General ROS: negative for - chills, fatigue, fever, night sweats, weight gain or weight loss Psychological ROS: negative for - behavioral disorder, hallucinations, memory difficulties, mood swings or suicidal ideation Ophthalmic ROS: negative for - blurry vision, double vision, eye pain or loss of vision ENT ROS: negative for -  epistaxis, nasal discharge, oral lesions, sore throat, tinnitus or vertigo Allergy and Immunology ROS: negative for - hives or itchy/watery eyes Hematological and Lymphatic ROS: negative for - bleeding problems, bruising or swollen lymph nodes Endocrine ROS: negative for - galactorrhea, hair pattern changes, polydipsia/polyuria or temperature intolerance Respiratory ROS: negative for - cough, hemoptysis, shortness of breath or  wheezing Cardiovascular ROS: negative for - chest pain, dyspnea on exertion, edema or irregular heartbeat Gastrointestinal ROS: negative for - abdominal pain, diarrhea, hematemesis, nausea/vomiting or stool incontinence Genito-Urinary ROS: negative for - dysuria, hematuria, incontinence or urinary frequency/urgency Musculoskeletal ROS: negative for - joint swelling or muscular weakness Neurological ROS: as noted in HPI Dermatological ROS: negative for rash and skin lesion changes   Physical Examination: Blood pressure (!) 218/117, pulse 76, temperature 98 F (36.7 C), temperature source Oral, resp. rate 19, height 5' 8 (1.727 m), weight 59 kg, SpO2 99%.  HEENT-  Normocephalic, no lesions, without obvious abnormality.  Normal external eye and conjunctiva.  Normal external ears. Normal external nose, mucus membranes and septum.   Cardiovascular- Single S1, S2 Lungs- CTA Abdomen- soft, non-tender; bowel sounds normal; no masses,  no organomegaly Extremities- no edema Musculoskeletal-no joint tenderness, deformity or swelling Skin-warm and dry, no hyperpigmentation, vitiligo, or suspicious lesions  Neurological Examination   Mental Status: Alert, oriented, thought content appropriate.  Speech fluent without evidence of aphasia.  Able to follow 3 step commands without difficulty. Cranial Nerves: II: Discs flat bilaterally; Visual fields grossly normal, pupils equal, round, reactive to light and accommodation III,IV, VI: ptosis not present, extra-ocular motions intact bilaterally V,VII: smile symmetric, facial light touch sensation normal bilaterally VIII: hearing normal bilaterally XI: bilateral shoulder shrug XII: midline tongue extension Motor: Right : Upper extremity   mild drift    Left:     Upper extremity   5/5  Lower extremity   mild drift     Lower extremity   5/5 Tone and bulk:normal tone throughout; no atrophy noted Sensory: Pinprick and light touch intact throughout,  bilaterally Deep Tendon Reflexes: Symmetric throughout Plantars: Right: downgoing   Left: downgoing Cerebellar: normal finger-to-nose and normal heel-to-shin testing bilaterally Gait: not tested due to safety concerns   Laboratory Studies:   Basic Metabolic Panel: Recent Labs  Lab 03/17/24 0120  NA 141  K 3.5  CL 104  CO2 25  GLUCOSE 114*  BUN 9  CREATININE 0.82  CALCIUM  9.3    Liver Function Tests: Recent Labs  Lab 03/17/24 0120  AST 22  ALT 9  ALKPHOS 83  BILITOT 0.2  PROT 6.9  ALBUMIN 4.5   No results for input(s): LIPASE, AMYLASE in the last 168 hours. No results for input(s): AMMONIA in the last 168 hours.  CBC: Recent Labs  Lab 03/17/24 0120  WBC 8.6  NEUTROABS 3.6  HGB 13.9  HCT 40.3  MCV 93.3  PLT 304    Cardiac Enzymes: No results for input(s): CKTOTAL, CKMB, CKMBINDEX, TROPONINI in the last 168 hours.  BNP: Invalid input(s): POCBNP  CBG: Recent Labs  Lab 03/17/24 0120  GLUCAP 121*    Microbiology: Results for orders placed or performed during the hospital encounter of 12/06/22  Culture, blood (Routine X 2) w Reflex to ID Panel     Status: None   Collection Time: 12/06/22 11:56 PM   Specimen: BLOOD LEFT ARM  Result Value Ref Range Status   Specimen Description BLOOD LEFT ARM  Final   Special Requests   Final    BOTTLES  DRAWN AEROBIC AND ANAEROBIC Blood Culture adequate volume   Culture   Final    NO GROWTH 5 DAYS Performed at [facility], [address], [place], [place] [address]    Report Status 12/12/2022 FINAL  Final  Culture, blood (Routine X 2) w Reflex to ID Panel     Status: None   Collection Time: 12/06/22 11:59 PM   Specimen: BLOOD RIGHT WRIST  Result Value Ref Range Status   Specimen Description BLOOD RIGHT WRIST  Final   Special Requests   Final    BOTTLES DRAWN AEROBIC AND ANAEROBIC Blood Culture adequate volume   Culture   Final    NO GROWTH 5 DAYS Performed at [facility],  [address] [place], [place] [address]    Report Status 12/12/2022 FINAL  Final    Coagulation Studies: Recent Labs    03/17/24 0120  LABPROT 12.4  INR 0.9    Urinalysis:  Recent Labs  Lab 03/17/24 0158  COLORURINE STRAW*  LABSPEC 1.036*  PHURINE 7.0  GLUCOSEU NEGATIVE  HGBUR NEGATIVE  BILIRUBINUR NEGATIVE  KETONESUR NEGATIVE  PROTEINUR NEGATIVE  NITRITE NEGATIVE  LEUKOCYTESUR NEGATIVE    Lipid Panel:     Component Value Date/Time   CHOL 216 (H) 11/24/2023 0000   CHOL 168 08/14/2022 0958   TRIG 55 11/24/2023 0000   HDL 71 11/24/2023 0000   HDL 40 08/14/2022 0958   CHOLHDL 3.0 11/24/2023 0000   LDLCALC 130 (H) 11/24/2023 0000    HgbA1C:  Lab Results  Component Value Date   HGBA1C 5.4 11/24/2023    Urine Drug Screen:      Component Value Date/Time   LABOPIA NEGATIVE 03/17/2024 0730   COCAINSCRNUR NEGATIVE 03/17/2024 0730   LABBENZ NEGATIVE 03/17/2024 0730   AMPHETMU NEGATIVE 03/17/2024 0730   THCU NEGATIVE 03/17/2024 0730   LABBARB NEGATIVE 03/17/2024 0730    Alcohol Level:  Recent Labs  Lab 03/17/24 0120  ETH 155*    Other results: EKG: NSR at 81 bpm.  Imaging: MR BRAIN WO CONTRAST Result Date: 03/17/2024 EXAM: MR Brain without Intravenous Contrast. CLINICAL HISTORY: 53 year old male with acute neurological deficit, stroke code presentation this morning. TECHNIQUE: Magnetic resonance images of the brain without intravenous contrast in multiple planes. CONTRAST: Without. COMPARISON: CT head, CTA, and CTP earlier today, 03/17/2024. FINDINGS: BRAIN: Subtle heterogeneous abnormal diffusion in the left corona radiata, tracking toward the posterior left lentiform (series 5 images 29 through 30) with punctate foci of restriction. No associated hemorrhage or mass effect. T2 heterogeneity in the bilateral deep gray nuclei compatible with multiple chronic lacunar infarcts, including thalamic involvement as seen on series 17 image 85. Small area of chronic  left periventricular white matter lacunar infarction or encephalomalacia suspected on series 8 image 17. No cortical encephalomalacia or chronic cerebral blood products identified. Brainstem and cerebellum appear negative. No intracranial hemorrhage. No midline shift or extra-axial fluid collection. No cerebellar tonsillar ectopia. The central arterial and venous flow voids are patent. VENTRICLES: No hydrocephalus. ORBITS: The orbits are normal. SINUSES AND MASTOIDS: Bilateral paranasal sinuses mucosal thickening and opacification is stable. Small volume retained secretions in the nasopharynx on series 8 image 4. Mastoids are well aerated. Grossly normal visible internal auditory structures. BONES: Background bone marrow signal within normal limits. Exaggerated cervical lordosis redemonstrated. Associated cervical facet arthropathy better demonstrated by CT. No acute fracture or focal osseous lesion. IMPRESSION: 1. Patchy acute small vessel type infarcts in the left corona radiata, tracking toward the  posterior left lentiform. No associated hemorrhage or mass effect. 2. 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 Electronically signed by: Helayne Hurst MD 03/17/2024 09:23 AM EST RP Workstation: [id number]   CT ANGIO HEAD NECK W WO CM W PERF (CODE STROKE) Result Date: 03/17/2024 EXAM: CTA Head and Neck with Perfusion 03/17/2024 02:16:07 AM TECHNIQUE: CTA of the head and neck was performed with the administration of intravenous contrast. 3D postprocessing with multiplanar reconstructions and MIPs was performed to evaluate the vascular anatomy. Cerebral perfusion analysis using computed tomography with contrast administration, including post-processing of parametric maps with determination of cerebral blood flow, cerebral blood volume, mean transit time and time-to-maximum. Automated exposure control, iterative reconstruction, and/or weight based adjustment  of the mA/kV was utilized to reduce the radiation dose to as low as reasonably achievable. COMPARISON: None available CLINICAL HISTORY: Neuro deficit, acute, stroke suspected Neuro deficit, acute, stroke suspected FINDINGS: AORTIC ARCH AND ARCH VESSELS: No dissection or arterial injury. No significant stenosis of the brachiocephalic or subclavian arteries. CERVICAL CAROTID ARTERIES: No dissection, arterial injury, or hemodynamically significant stenosis by NASCET criteria. CERVICAL VERTEBRAL ARTERIES: No dissection, arterial injury, or significant stenosis. LUNGS AND MEDIASTINUM: Unremarkable. SOFT TISSUES: No acute abnormality. BONES: No acute abnormality. ANTERIOR CIRCULATION: Internal carotid arteries are patent with moderate bilateral paraclinoid stenosis. No significant stenosis of the anterior cerebral arteries. No significant stenosis of the middle cerebral arteries. No aneurysm. POSTERIOR CIRCULATION: No significant stenosis of the posterior cerebral arteries. No significant stenosis of the basilar artery. No significant stenosis of the vertebral arteries. No aneurysm. OTHER: No dural venous sinus thrombosis on this non-dedicated study. EXAM QUALITY: Exam quality is adequate with diagnostic perfusion maps. No significant motion artifact. Appropriate arterial inflow and venous outflow curves. CORE INFARCT (CBF<30% volume): 0 mL TOTAL HYPOPERFUSION (Tmax>6s volume): 0 mL Mismatch volume: 0 mL Mismatch ratio: not applicable Location: not applicable IMPRESSION: 1. No acute large vessel occlusion. 2. Moderate bilateral paraclinoid ICA stenosis. 3. No evidence of core infarct or penumbra on CT perfusion. Electronically signed by: Gilmore Molt MD 03/17/2024 02:57 AM EST RP Workstation: [id number]   CT CERVICAL SPINE WO CONTRAST Result Date: 03/17/2024 EXAM: CT CERVICAL SPINE WITHOUT CONTRAST 03/17/2024 02:06:06 AM TECHNIQUE: CT of the cervical spine was performed without the administration of intravenous  contrast. Multiplanar reformatted images are provided for review. Automated exposure control, iterative reconstruction, and/or weight based adjustment of the mA/kV was utilized to reduce the radiation dose to as low as reasonably achievable. COMPARISON: None available. CLINICAL HISTORY: Neuro deficit, acute, stroke suspected. FINDINGS: CERVICAL SPINE: BONES AND ALIGNMENT: No acute fracture or traumatic malalignment. DEGENERATIVE CHANGES: Advanced bilateral degenerative facet disease diffusely. SOFT TISSUES: No prevertebral soft tissue swelling. IMPRESSION: 1. No acute abnormality of the cervical spine. 2. Advanced bilateral degenerative facet disease. Electronically signed by: Franky Crease MD 03/17/2024 02:22 AM EST RP Workstation: [id number]   CT HEAD CODE STROKE WO CONTRAST` Result Date: 03/17/2024 EXAM: CT HEAD WITHOUT 03/17/2024 02:02:17 AM TECHNIQUE: CT of the head was performed without the administration of intravenous contrast. Automated exposure control, iterative reconstruction, and/or weight based adjustment of the mA/kV was utilized to reduce the radiation dose to as low as reasonably achievable. COMPARISON: None available. CLINICAL HISTORY: Neuro deficit, acute, stroke suspected FINDINGS: BRAIN AND VENTRICLES: No acute intracranial hemorrhage. No mass effect or midline shift. No extra-axial fluid collection. No evidence of acute infarct. Small remote right thalamic and bilateral corona radiata lacunar infarcts. Patchy white matter  hypodensities, compatible with chronic microvascular ischemic disease. No hydrocephalus. ORBITS: No acute abnormality. SINUSES AND MASTOIDS: No acute abnormality. SOFT TISSUES AND SKULL: No acute skull fracture. Findings discussed with Dr. Claudene via telephone at 2:08 PM. IMPRESSION: 1. No acute intracranial abnormality.  ASPECTS 10. 2. Small remote right thalamic and bilateral corona radiata lacunar infarcts. Electronically signed by: Gilmore Molt MD 03/17/2024 02:10 AM  EST RP Workstation: [id number]     Assessment/Plan: 53 y.o. male with medical history significant of asthma and HTN who presented with acute onset right sided weakness and slurred speech.  Patient actually had onset of ataxic gait about 2 days prior to presentation.  Then on yesterday developed the right sided symptoms.  Right sided symptoms resolved spontaneously after about 30 minutes. Was seen in the ED as a code stroke.  NIHSS of 2.  Was not a thrombolytic or thrombectomy candidate due to being outside of the treatment window, nondisabling symptoms and without target lesion identified on imaging. 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  Etiology small vessel disease versus embolic.  CTA of the head and neck personally reviewed and reveals moderate bilateral ICA paraclinoid stenosis.  Further work up recommended.    Recommendations:  1. HgbA1c, fasting lipid panel.  Goal A1c<7.0, goal LDL<70. 2. Increase Crestor  to 20mg  3. PT consult, OT consult, Speech consult 4. Echocardiogram with bubble study 5. Smoking and ETOH cessation counseling 6. Prophylactic therapy-Dual antiplatelet therapy with ASA 81mg  and Plavix 75mg  for three weeks with change to ASA 81mg  alone as monotherapy after that time.  7. NPO until RN stroke swallow screen 8. Telemetry monitoring 9. Frequent neuro checks 10. BP control    Case discussed with Dr. Laurita Sonny Hock, MD Neurology  03/17/2024, 10:30 AM          [1] No Known Allergies

## 2024-03-17 NOTE — Evaluation (Signed)
 Physical Therapy Evaluation Patient Details Name: Junie Engram MRN: [id number] DOB: [birth year] Today's Date: 03/17/2024  History of Present Illness  Eamon Tantillo is a 53yoM who comes to [facility]24 c Rt hemiweakness and slurred speech, 2 falls at home while going to [place]. PMH: HTN, heavy ETOH use.  Clinical Impression  Pt reports all symptoms resolved, he demonstrates all mobility assessed at independent level, no LOB, >460ft AMB without device. Pt scores high on Berg Balance Test without evidence of falls risk. No PT services indicated at this time. PT signing off.       If plan is discharge home, recommend the following:     Can travel by private vehicle        Equipment Recommendations None recommended by PT  Recommendations for Other Services       Functional Status Assessment Patient has had a recent decline in their functional status and demonstrates the ability to make significant improvements in function in a reasonable and predictable amount of time.     Precautions / Restrictions Precautions Precautions: None Restrictions Weight Bearing Restrictions Per Provider Order: No      Mobility  Bed Mobility Overal bed mobility: Independent                  Transfers Overall transfer level: Independent                      Ambulation/Gait Ambulation/Gait assistance: Independent Gait Distance (Feet): 450 Feet Assistive device: None         General Gait Details: baseline speed, distance; more hip pain when walking but without gross antalgia  Stairs            Wheelchair Mobility     Tilt Bed    Modified Rankin (Stroke Patients Only)       Balance                                 Standardized Balance Assessment Standardized Balance Assessment : Berg Balance Test Berg Balance Test Sit to Stand: Able to stand without using hands and stabilize independently Standing Unsupported: Able to stand safely 2  minutes Sitting with Back Unsupported but Feet Supported on Floor or Stool: Able to sit safely and securely 2 minutes Stand to Sit: Sits safely with minimal use of hands Transfers: Able to transfer safely, minor use of hands Standing Unsupported with Eyes Closed: Able to stand 10 seconds safely Standing Ubsupported with Feet Together: Able to place feet together independently and stand 1 minute safely From Standing, Reach Forward with Outstretched Arm: Can reach confidently >25 cm (10) From Standing Position, Pick up Object from Floor: Able to pick up shoe safely and easily From Standing Position, Turn to Look Behind Over each Shoulder: Looks behind from both sides and weight shifts well Turn 360 Degrees: Able to turn 360 degrees safely in 4 seconds or less Standing Unsupported, Alternately Place Feet on Step/Stool: Able to stand independently and safely and complete 8 steps in 20 seconds Standing Unsupported, One Foot in Front: Needs help to step but can hold 15 seconds Standing on One Leg: Able to lift leg independently and hold 5-10 seconds Total Score: 52         Pertinent Vitals/Pain Pain Assessment Pain Assessment: 0-10 Pain Location: Hip area when waling/ in weightbearing Pain Descriptors / Indicators: Aching Pain Intervention(s): Limited activity within patient's tolerance, Monitored during  session, Premedicated before session, Repositioned    Home Living Family/patient expects to be discharged to:: Private residence Living Arrangements: Spouse/significant other Available Help at Discharge:  (wife (elderly parents in house don't drive)) Type of Home: House Home Access: Stairs to enter Entrance Stairs-Rails: None Entrance Stairs-Number of Steps: 6   Home Layout: One level;Full bath on main level Home Equipment: None      Prior Function Prior Level of Function : Working/employed;Driving                     Extremity/Trunk Interior And Spatial Designer Comments      Exercises     Assessment/Plan    PT Assessment Patient does not need any further PT services  PT Problem List         PT Treatment Interventions      PT Goals (Current goals can be found in the Care Plan section)  Acute Rehab PT Goals PT Goal Formulation: All assessment and education complete, DC therapy    Frequency       Co-evaluation               AM-PAC PT 6 Clicks Mobility  Outcome Measure Help needed turning from your back to your side while in a flat bed without using bedrails?: None Help needed moving from lying on your back to sitting on the side of a flat bed without using bedrails?: None Help needed moving to and from a bed to a chair (including a wheelchair)?: None Help needed standing up from a chair using your arms (e.g., wheelchair or bedside chair)?: None Help needed to walk in hospital room?: None Help needed climbing 3-5 steps with a railing? : None 6 Click Score: 24    End of Session   Activity Tolerance: Patient tolerated treatment well;No increased pain Patient left: in chair;with family/visitor present;with call bell/phone within reach Nurse Communication: Mobility status      Time: 9052-8997 PT Time Calculation (min) (ACUTE ONLY): 15 min   Charges:   PT Evaluation $PT Eval Low Complexity: 1 Low   PT General Charges $$ ACUTE PT VISIT: 1 Visit       1:04 PM, 03/17/2024 Peggye JAYSON Linear, PT, DPT Physical Therapist - [facility]  [phone] (ASCOM)    Luellen Howson C 03/17/2024, 1:02 PM

## 2024-03-17 NOTE — ED Notes (Signed)
 Assuming care for this pt from previous RN. Neuro assessment completed. NIH 1 for slurred speech. Pt sts he feels like his normal self again. Wife at bedside provided with recliner. Pt informed of pending urine sample. Urinal at bedside. Reports unable to provide sample at this time. Pt has call bell within reach. Denies any additional needs.

## 2024-03-17 NOTE — Discharge Instructions (Signed)

## 2024-03-17 NOTE — ED Notes (Signed)
 CCMD called to admit patient to monitor

## 2024-03-18 ENCOUNTER — Encounter: Payer: Self-pay | Admitting: Internal Medicine

## 2024-03-18 DIAGNOSIS — I6340 Cerebral infarction due to embolism of unspecified cerebral artery: Secondary | ICD-10-CM

## 2024-03-18 DIAGNOSIS — F1090 Alcohol use, unspecified, uncomplicated: Secondary | ICD-10-CM | POA: Diagnosis not present

## 2024-03-18 DIAGNOSIS — K860 Alcohol-induced chronic pancreatitis: Secondary | ICD-10-CM | POA: Diagnosis not present

## 2024-03-18 DIAGNOSIS — R471 Dysarthria and anarthria: Secondary | ICD-10-CM | POA: Diagnosis not present

## 2024-03-18 DIAGNOSIS — F10229 Alcohol dependence with intoxication, unspecified: Secondary | ICD-10-CM | POA: Diagnosis present

## 2024-03-18 DIAGNOSIS — I739 Peripheral vascular disease, unspecified: Secondary | ICD-10-CM

## 2024-03-18 DIAGNOSIS — E785 Hyperlipidemia, unspecified: Secondary | ICD-10-CM | POA: Diagnosis not present

## 2024-03-18 DIAGNOSIS — I639 Cerebral infarction, unspecified: Secondary | ICD-10-CM | POA: Diagnosis not present

## 2024-03-18 DIAGNOSIS — G8191 Hemiplegia, unspecified affecting right dominant side: Secondary | ICD-10-CM | POA: Diagnosis not present

## 2024-03-18 DIAGNOSIS — R4781 Slurred speech: Secondary | ICD-10-CM | POA: Diagnosis not present

## 2024-03-18 DIAGNOSIS — I6381 Other cerebral infarction due to occlusion or stenosis of small artery: Secondary | ICD-10-CM | POA: Diagnosis not present

## 2024-03-18 DIAGNOSIS — J4520 Mild intermittent asthma, uncomplicated: Secondary | ICD-10-CM | POA: Diagnosis not present

## 2024-03-18 DIAGNOSIS — I10 Essential (primary) hypertension: Secondary | ICD-10-CM | POA: Diagnosis not present

## 2024-03-18 DIAGNOSIS — F17210 Nicotine dependence, cigarettes, uncomplicated: Secondary | ICD-10-CM | POA: Diagnosis not present

## 2024-03-18 DIAGNOSIS — R29703 NIHSS score 3: Secondary | ICD-10-CM | POA: Diagnosis not present

## 2024-03-18 DIAGNOSIS — Z7902 Long term (current) use of antithrombotics/antiplatelets: Secondary | ICD-10-CM | POA: Diagnosis not present

## 2024-03-18 DIAGNOSIS — Z7982 Long term (current) use of aspirin: Secondary | ICD-10-CM | POA: Diagnosis not present

## 2024-03-18 DIAGNOSIS — E782 Mixed hyperlipidemia: Secondary | ICD-10-CM | POA: Diagnosis not present

## 2024-03-18 DIAGNOSIS — Y906 Blood alcohol level of 120-199 mg/100 ml: Secondary | ICD-10-CM | POA: Diagnosis present

## 2024-03-18 LAB — ECHOCARDIOGRAM COMPLETE
AR max vel: 3.31 cm2
AV Area VTI: 3.15 cm2
AV Area mean vel: 3.04 cm2
AV Mean grad: 5 mmHg
AV Peak grad: 10.6 mmHg
Ao pk vel: 1.63 m/s
Area-P 1/2: 3.17 cm2
Calc EF: 65.4 %
Height: 68 in
MV VTI: 2.84 cm2
S' Lateral: 1.9 cm
Single Plane A2C EF: 68.8 %
Single Plane A4C EF: 62.2 %
Weight: 2080 [oz_av]

## 2024-03-18 LAB — LIPID PANEL
Cholesterol: 139 mg/dL (ref 0–200)
HDL: 69 mg/dL (ref >40)
LDL Cholesterol: 47 mg/dL (ref 0–99)
Total CHOL/HDL Ratio: 2 ratio
Triglycerides: 116 mg/dL (ref <150)
VLDL: 23 mg/dL (ref 0–40)

## 2024-03-18 LAB — HIV ANTIBODY (ROUTINE TESTING W REFLEX): HIV Screen 4th Generation wRfx: NONREACTIVE

## 2024-03-18 MED ADMIN — Aspirin Tab Delayed Release 81 MG: 81 mg | ORAL | NDC 77333003125

## 2024-03-18 NOTE — Progress Notes (Signed)
 OT Cancellation Note  Patient Details Name: Isaac Meza MRN: [id number] DOB: February 05, 1971   Cancelled Treatment:    Reason Eval/Treat Not Completed: OT screened, no needs identified, will sign off;Other (comment). Duplicate orders received. Pt evaluated by OT this admission and signed off as pt is near functional baseline for ADLs and mobility with strong family support. No OT needs.    Elston Slot, M.S. OTR/L  03/18/2024, 8:21 AM  ascom [phone]

## 2024-03-18 NOTE — Plan of Care (Signed)
 Problem: Education: Goal: Knowledge of disease or condition will improve 03/18/2024 1858 by Isaac Meza LABOR, RN Outcome: Progressing 03/18/2024 1641 by Isaac Meza LABOR, RN Outcome: Progressing Goal: Knowledge of secondary prevention will improve (MUST DOCUMENT ALL) 03/18/2024 1858 by Isaac Meza LABOR, RN Outcome: Progressing 03/18/2024 1641 by Isaac Meza LABOR, RN Outcome: Progressing Goal: Knowledge of patient specific risk factors will improve (DELETE if not current risk factor) 03/18/2024 1858 by Isaac Meza LABOR, RN Outcome: Progressing 03/18/2024 1641 by Isaac Meza LABOR, RN Outcome: Progressing   Problem: Ischemic Stroke/TIA Tissue Perfusion: Goal: Complications of ischemic stroke/TIA will be minimized 03/18/2024 1858 by Isaac Meza LABOR, RN Outcome: Progressing 03/18/2024 1641 by Isaac Meza LABOR, RN Outcome: Progressing   Problem: Coping: Goal: Will verbalize positive feelings about self 03/18/2024 1858 by Isaac Meza LABOR, RN Outcome: Progressing 03/18/2024 1641 by Isaac Meza LABOR, RN Outcome: Progressing Goal: Will identify appropriate support needs 03/18/2024 1858 by Isaac Meza LABOR, RN Outcome: Progressing 03/18/2024 1641 by Isaac Meza LABOR, RN Outcome: Progressing   Problem: Health Behavior/Discharge Planning: Goal: Ability to manage health-related needs will improve 03/18/2024 1858 by Isaac Meza LABOR, RN Outcome: Progressing 03/18/2024 1641 by Isaac Meza LABOR, RN Outcome: Progressing Goal: Goals will be collaboratively established with patient/family 03/18/2024 1858 by Isaac Meza LABOR, RN Outcome: Progressing 03/18/2024 1641 by Isaac Meza LABOR, RN Outcome: Progressing   Problem: Self-Care: Goal: Ability to participate in self-care as condition permits will improve 03/18/2024 1858 by Isaac Meza LABOR, RN Outcome: Progressing 03/18/2024 1641 by Isaac Meza LABOR, RN Outcome: Progressing Goal: Verbalization  of feelings and concerns over difficulty with self-care will improve 03/18/2024 1858 by Isaac Meza LABOR, RN Outcome: Progressing 03/18/2024 1641 by Isaac Meza LABOR, RN Outcome: Progressing Goal: Ability to communicate needs accurately will improve 03/18/2024 1858 by Isaac Meza LABOR, RN Outcome: Progressing 03/18/2024 1641 by Isaac Meza LABOR, RN Outcome: Progressing   Problem: Nutrition: Goal: Risk of aspiration will decrease 03/18/2024 1858 by Isaac Meza LABOR, RN Outcome: Progressing 03/18/2024 1641 by Isaac Meza LABOR, RN Outcome: Progressing Goal: Dietary intake will improve 03/18/2024 1858 by Isaac Meza LABOR, RN Outcome: Progressing 03/18/2024 1641 by Isaac Meza LABOR, RN Outcome: Progressing   Problem: Education: Goal: Knowledge of General Education information will improve Description: Including pain rating scale, medication(s)/side effects and non-pharmacologic comfort measures 03/18/2024 1858 by Isaac Meza LABOR, RN Outcome: Progressing 03/18/2024 1641 by Isaac Meza LABOR, RN Outcome: Progressing   Problem: Health Behavior/Discharge Planning: Goal: Ability to manage health-related needs will improve 03/18/2024 1858 by Isaac Meza LABOR, RN Outcome: Progressing 03/18/2024 1641 by Isaac Meza LABOR, RN Outcome: Progressing   Problem: Clinical Measurements: Goal: Ability to maintain clinical measurements within normal limits will improve 03/18/2024 1858 by Isaac Meza LABOR, RN Outcome: Progressing 03/18/2024 1641 by Isaac Meza LABOR, RN Outcome: Progressing Goal: Will remain free from infection 03/18/2024 1858 by Isaac Meza LABOR, RN Outcome: Progressing 03/18/2024 1641 by Isaac Meza LABOR, RN Outcome: Progressing Goal: Diagnostic test results will improve 03/18/2024 1858 by Isaac Meza LABOR, RN Outcome: Progressing 03/18/2024 1641 by Isaac Meza LABOR, RN Outcome: Progressing Goal: Respiratory complications will  improve 03/18/2024 1858 by Isaac Meza LABOR, RN Outcome: Progressing 03/18/2024 1641 by Isaac Meza LABOR, RN Outcome: Progressing Goal: Cardiovascular complication will be avoided 03/18/2024 1858 by Isaac Meza LABOR, RN Outcome: Progressing 03/18/2024 1641 by Isaac Meza LABOR, RN Outcome: Progressing   Problem: Activity: Goal: Risk for activity intolerance will decrease 03/18/2024 1858 by Isaac Meza LABOR, RN Outcome: Progressing 03/18/2024 1641 by  Isaac Meza LABOR, RN Outcome: Progressing   Problem: Nutrition: Goal: Adequate nutrition will be maintained 03/18/2024 1858 by Isaac Meza LABOR, RN Outcome: Progressing 03/18/2024 1641 by Isaac Meza LABOR, RN Outcome: Progressing   Problem: Coping: Goal: Level of anxiety will decrease 03/18/2024 1858 by Isaac Meza LABOR, RN Outcome: Progressing 03/18/2024 1641 by Isaac Meza LABOR, RN Outcome: Progressing   Problem: Elimination: Goal: Will not experience complications related to bowel motility 03/18/2024 1858 by Isaac Meza LABOR, RN Outcome: Progressing 03/18/2024 1641 by Isaac Meza LABOR, RN Outcome: Progressing Goal: Will not experience complications related to urinary retention 03/18/2024 1858 by Isaac Meza LABOR, RN Outcome: Progressing 03/18/2024 1641 by Isaac Meza LABOR, RN Outcome: Progressing   Problem: Pain Managment: Goal: General experience of comfort will improve and/or be controlled 03/18/2024 1858 by Isaac Meza LABOR, RN Outcome: Progressing 03/18/2024 1641 by Isaac Meza LABOR, RN Outcome: Progressing   Problem: Safety: Goal: Ability to remain free from injury will improve 03/18/2024 1858 by Isaac Meza LABOR, RN Outcome: Progressing 03/18/2024 1641 by Isaac Meza LABOR, RN Outcome: Progressing   Problem: Skin Integrity: Goal: Risk for impaired skin integrity will decrease 03/18/2024 1858 by Isaac Meza LABOR, RN Outcome: Progressing 03/18/2024 1641 by Isaac Meza LABOR, RN Outcome: Progressing

## 2024-03-18 NOTE — Plan of Care (Signed)

## 2024-03-18 NOTE — Progress Notes (Signed)
 Subjective: Patient with worsening of slurred speech today.  Otherwise no neurological changes  Objective: Current vital signs: BP (!) 196/103 (BP Location: Left Arm)   Pulse 68   Temp 98.2 F (36.8 C) (Oral)   Resp 18   Ht 5' 8 (1.727 m)   Wt 59 kg   SpO2 100%   BMI 19.77 kg/m  Vital signs in last 24 hours: Temp:  [98 F (36.7 C)-98.7 F (37.1 C)] 98.2 F (36.8 C) (12/12 1140) Pulse Rate:  [55-83] 68 (12/12 1140) Resp:  [16-18] 18 (12/12 1140) BP: (133-196)/(87-121) 196/103 (12/12 1140) SpO2:  [98 %-100 %] 100 % (12/12 1140)  Intake/Output from previous day: 12/11 0701 - 12/12 0700 In: 133.2 [I.V.:133.2] Out: 925 [Urine:925] Intake/Output this shift: Total I/O In: 0  Out: 900 [Urine:900] Nutritional status:  Diet Order             Diet Heart Fluid consistency: Thin  Diet effective now                   Neurologic Exam: Mental Status: Alert, oriented, thought content appropriate.  Speech fluent without evidence of aphasia.  Able to follow 3 step commands without difficulty.  Dysarthric Cranial Nerves: II: Discs flat bilaterally; Visual fields grossly normal, pupils equal, round, reactive to light and accommodation III,IV, VI: ptosis not present, extra-ocular motions intact bilaterally V,VII: smile symmetric, facial light touch sensation normal bilaterally VIII: hearing normal bilaterally XI: bilateral shoulder shrug XII: midline tongue extension Motor: RUE and RLE drift Sensory: Pinprick and light touch intact throughout, bilaterally Deep Tendon Reflexes: Symmetric throughout Plantars: Right: downgoing                                Left: downgoing Cerebellar: normal finger-to-nose and normal heel-to-shin testing bilaterally Gait: not tested due to safety concerns  Lab Results: Basic Metabolic Panel: Recent Labs  Lab 03/17/24 0120  NA 141  K 3.5  CL 104  CO2 25  GLUCOSE 114*  BUN 9  CREATININE 0.82  CALCIUM  9.3    Liver Function  Tests: Recent Labs  Lab 03/17/24 0120  AST 22  ALT 9  ALKPHOS 83  BILITOT 0.2  PROT 6.9  ALBUMIN 4.5   No results for input(s): LIPASE, AMYLASE in the last 168 hours. No results for input(s): AMMONIA in the last 168 hours.  CBC: Recent Labs  Lab 03/17/24 0120  WBC 8.6  NEUTROABS 3.6  HGB 13.9  HCT 40.3  MCV 93.3  PLT 304    Cardiac Enzymes: No results for input(s): CKTOTAL, CKMB, CKMBINDEX, TROPONINI in the last 168 hours.  Lipid Panel: Recent Labs  Lab 03/18/24 0611  CHOL 139  TRIG 116  HDL 69  CHOLHDL 2.0  VLDL 23  LDLCALC 47    CBG: Recent Labs  Lab 03/17/24 0120  GLUCAP 121*    Microbiology: Results for orders placed or performed during the hospital encounter of 12/06/22  Culture, blood (Routine X 2) w Reflex to ID Panel     Status: None   Collection Time: 12/06/22 11:56 PM   Specimen: BLOOD LEFT ARM  Result Value Ref Range Status   Specimen Description BLOOD LEFT ARM  Final   Special Requests   Final    BOTTLES DRAWN AEROBIC AND ANAEROBIC Blood Culture adequate volume   Culture   Final    NO GROWTH 5 DAYS Performed at [facility], [address] [place], [place] [address]    Report Status 12/12/2022 FINAL  Final  Culture, blood (Routine X 2) w Reflex to ID Panel     Status: None   Collection Time: 12/06/22 11:59 PM   Specimen: BLOOD RIGHT WRIST  Result Value Ref Range Status   Specimen Description BLOOD RIGHT WRIST  Final   Special Requests   Final    BOTTLES DRAWN AEROBIC AND ANAEROBIC Blood Culture adequate volume   Culture   Final    NO GROWTH 5 DAYS Performed at [facility], [address] [place], [place] [address]    Report Status 12/12/2022 FINAL  Final    Coagulation Studies: Recent Labs    03/17/24 0120  LABPROT 12.4  INR 0.9    Imaging: MR BRAIN WO CONTRAST Result Date: 03/17/2024 EXAM: MR Brain without Intravenous Contrast. CLINICAL HISTORY: 53 year old male with acute neurological  deficit, stroke code presentation this morning. TECHNIQUE: Magnetic resonance images of the brain without intravenous contrast in multiple planes. CONTRAST: Without. COMPARISON: CT head, CTA, and CTP earlier today, 03/17/2024. FINDINGS: BRAIN: Subtle heterogeneous abnormal diffusion in the left corona radiata, tracking toward the posterior left lentiform (series 5 images 29 through 30) with punctate foci of restriction. No associated hemorrhage or mass effect. T2 heterogeneity in the bilateral deep gray nuclei compatible with multiple chronic lacunar infarcts, including thalamic involvement as seen on series 17 image 85. Small area of chronic left periventricular white matter lacunar infarction or encephalomalacia suspected on series 8 image 17. No cortical encephalomalacia or chronic cerebral blood products identified. Brainstem and cerebellum appear negative. No intracranial hemorrhage. No midline shift or extra-axial fluid collection. No cerebellar tonsillar ectopia. The central arterial and venous flow voids are patent. VENTRICLES: No hydrocephalus. ORBITS: The orbits are normal. SINUSES AND MASTOIDS: Bilateral paranasal sinuses mucosal thickening and opacification is stable. Small volume retained secretions in the nasopharynx on series 8 image 4. Mastoids are well aerated. Grossly normal visible internal auditory structures. BONES: Background bone marrow signal within normal limits. Exaggerated cervical lordosis redemonstrated. Associated cervical facet arthropathy better demonstrated by CT. No acute fracture or focal osseous lesion. IMPRESSION: 1. Patchy acute small vessel type infarcts in the left corona radiata, tracking toward the posterior left lentiform. No associated hemorrhage or mass effect. 2. 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 Electronically signed by: Helayne Hurst MD 03/17/2024 09:23 AM EST RP Workstation: [id number]   CT ANGIO  HEAD NECK W WO CM W PERF (CODE STROKE) Result Date: 03/17/2024 EXAM: CTA Head and Neck with Perfusion 03/17/2024 02:16:07 AM TECHNIQUE: CTA of the head and neck was performed with the administration of intravenous contrast. 3D postprocessing with multiplanar reconstructions and MIPs was performed to evaluate the vascular anatomy. Cerebral perfusion analysis using computed tomography with contrast administration, including post-processing of parametric maps with determination of cerebral blood flow, cerebral blood volume, mean transit time and time-to-maximum. Automated exposure control, iterative reconstruction, and/or weight based adjustment of the mA/kV was utilized to reduce the radiation dose to as low as reasonably achievable. COMPARISON: None available CLINICAL HISTORY: Neuro deficit, acute, stroke suspected Neuro deficit, acute, stroke suspected FINDINGS: AORTIC ARCH AND ARCH VESSELS: No dissection or arterial injury. No significant stenosis of the brachiocephalic or subclavian arteries. CERVICAL CAROTID ARTERIES: No dissection, arterial injury, or hemodynamically significant stenosis by NASCET criteria. CERVICAL VERTEBRAL ARTERIES: No dissection, arterial injury, or significant stenosis. LUNGS AND MEDIASTINUM: Unremarkable. SOFT TISSUES: No acute  abnormality. BONES: No acute abnormality. ANTERIOR CIRCULATION: Internal carotid arteries are patent with moderate bilateral paraclinoid stenosis. No significant stenosis of the anterior cerebral arteries. No significant stenosis of the middle cerebral arteries. No aneurysm. POSTERIOR CIRCULATION: No significant stenosis of the posterior cerebral arteries. No significant stenosis of the basilar artery. No significant stenosis of the vertebral arteries. No aneurysm. OTHER: No dural venous sinus thrombosis on this non-dedicated study. EXAM QUALITY: Exam quality is adequate with diagnostic perfusion maps. No significant motion artifact. Appropriate arterial inflow  and venous outflow curves. CORE INFARCT (CBF<30% volume): 0 mL TOTAL HYPOPERFUSION (Tmax>6s volume): 0 mL Mismatch volume: 0 mL Mismatch ratio: not applicable Location: not applicable IMPRESSION: 1. No acute large vessel occlusion. 2. Moderate bilateral paraclinoid ICA stenosis. 3. No evidence of core infarct or penumbra on CT perfusion. Electronically signed by: Gilmore Molt MD 03/17/2024 02:57 AM EST RP Workstation: [id number]   CT CERVICAL SPINE WO CONTRAST Result Date: 03/17/2024 EXAM: CT CERVICAL SPINE WITHOUT CONTRAST 03/17/2024 02:06:06 AM TECHNIQUE: CT of the cervical spine was performed without the administration of intravenous contrast. Multiplanar reformatted images are provided for review. Automated exposure control, iterative reconstruction, and/or weight based adjustment of the mA/kV was utilized to reduce the radiation dose to as low as reasonably achievable. COMPARISON: None available. CLINICAL HISTORY: Neuro deficit, acute, stroke suspected. FINDINGS: CERVICAL SPINE: BONES AND ALIGNMENT: No acute fracture or traumatic malalignment. DEGENERATIVE CHANGES: Advanced bilateral degenerative facet disease diffusely. SOFT TISSUES: No prevertebral soft tissue swelling. IMPRESSION: 1. No acute abnormality of the cervical spine. 2. Advanced bilateral degenerative facet disease. Electronically signed by: Franky Crease MD 03/17/2024 02:22 AM EST RP Workstation: [id number]   CT HEAD CODE STROKE WO CONTRAST` Result Date: 03/17/2024 EXAM: CT HEAD WITHOUT 03/17/2024 02:02:17 AM TECHNIQUE: CT of the head was performed without the administration of intravenous contrast. Automated exposure control, iterative reconstruction, and/or weight based adjustment of the mA/kV was utilized to reduce the radiation dose to as low as reasonably achievable. COMPARISON: None available. CLINICAL HISTORY: Neuro deficit, acute, stroke suspected FINDINGS: BRAIN AND VENTRICLES: No acute intracranial hemorrhage. No mass effect or  midline shift. No extra-axial fluid collection. No evidence of acute infarct. Small remote right thalamic and bilateral corona radiata lacunar infarcts. Patchy white matter hypodensities, compatible with chronic microvascular ischemic disease. No hydrocephalus. ORBITS: No acute abnormality. SINUSES AND MASTOIDS: No acute abnormality. SOFT TISSUES AND SKULL: No acute skull fracture. Findings discussed with Dr. Claudene via telephone at 2:08 PM. IMPRESSION: 1. No acute intracranial abnormality.  ASPECTS 10. 2. Small remote right thalamic and bilateral corona radiata lacunar infarcts. Electronically signed by: Gilmore Molt MD 03/17/2024 02:10 AM EST RP Workstation: [id number]    Medications: I have reviewed the patient's current medications. Scheduled:  aspirin EC  81 mg Oral Daily   clopidogrel  75 mg Oral Daily   enoxaparin  (LOVENOX ) injection  40 mg Subcutaneous Q24H   folic acid   1 mg Oral Daily   multivitamin with minerals  1 tablet Oral Daily   rosuvastatin   10 mg Oral QPM   thiamine   100 mg Oral Daily   Or   thiamine   100 mg Intravenous Daily    Assessment/Plan: 53 y.o. male with medical history significant of asthma and HTN who presented with acute onset right sided weakness and slurred speech.  Was not a thrombolytic or thrombectomy candidate due to being outside of the treatment window, nondisabling symptoms and without target lesion identified on imaging. MRI of the brain personally reviewed and reveals small,  acute infarcts in the left corona radiata.  Etiology small vessel disease versus embolic.  CTA of the head and neck personally reviewed and reveals moderate bilateral ICA paraclinoid stenosis.  A1c 5.4, LDL 47.  Echocardiogram result pending  Recommendations: BP control.  Goal BP<140/80 Agree with dual antiplatelet therapy Would increase statin to high intensity (20mg ) PT/OT and speech therapy    LOS: 0 days   Sonny Hock, MD Neurology  03/18/2024  12:54 PM

## 2024-03-18 NOTE — Progress Notes (Signed)
°  Progress Note   Patient: Isaac Meza FMW:[id number] DOB: 1970-04-15 DOA: 03/17/2024     0 DOS: the patient was seen and examined on 03/18/2024   Brief hospital course: Isaac Meza is a 53 y.o. male with medical history significant of mild intermittent asthma, HTN, presented with strokelike symptoms.  MRI brain showed Patchy acute small vessel type infarcts in the left corona radiata, tracking toward the posterior left lentiform.  CT angiogram of the neck and head did not show severe stenosis. Echocardiogram showed ejection fraction of 60 to 65%, no valvular abnormality reported, no PFO identified.  Lipid panel showed LDL 47, HDL 69. Patient placed on aspirin, Plavix and Crestor .   Principal Problem:   CVA (cerebral vascular accident) [facility]) Active Problems:   Stroke [facility])   Assessment and Plan: Acute stroke. Patient has significant dysarthria, PT/OT has eval the patient, he was able to walk, no additional therapy needed. Discussed with neurology, will continue aspirin, Plavix.  Due to high blood pressure, patient will be kept in hospital for another night.  Potentially discharge home  tomorrow.  Essential hypertension. Blood pressure running high, on as needed blood pressure medicine.  Alcohol use disorder with intoxication. Advised to quit, continue CIWA protocol, monitor electrolytes and magnesium level.       Subjective:  Patient still has slurred speech, no other complaint.  Physical Exam: Vitals:   03/17/24 2346 03/18/24 0345 03/18/24 0740 03/18/24 1140  BP: (!) 167/93 (!) 181/100 (!) 184/121 (!) 196/103  Pulse: (!) 55 69 83 68  Resp: 18 18 16 18   Temp: 98.1 F (36.7 C) 98.1 F (36.7 C) 98 F (36.7 C) 98.2 F (36.8 C)  TempSrc: Oral Oral Oral Oral  SpO2: 99% 98% 100% 100%  Weight:      Height:       General exam: Appears calm and comfortable  Respiratory system: Clear to auscultation. Respiratory effort normal. Cardiovascular system: S1 & S2  heard, RRR. No JVD, murmurs, rubs, gallops or clicks. No pedal edema. Gastrointestinal system: Abdomen is nondistended, soft and nontender. No organomegaly or masses felt. Normal bowel sounds heard. Central nervous system: Alert and oriented.  Speech is slurred. Extremities: Symmetric 5 x 5 power. Skin: No rashes, lesions or ulcers Psychiatry: Judgement and insight appear normal. Mood & affect appropriate.    Data Reviewed:  Echocardiogram, CT scan, MRI results reviewed.  Lab results reviewed.  Family Communication: Wife updated at bedside.  Disposition: Status is: Inpatient Remains inpatient appropriate because: Severity of disease     Time spent: 35 minutes  Author: Murvin Mana, MD 03/18/2024 2:55 PM  For on call review [email or web address].

## 2024-03-18 NOTE — Hospital Course (Signed)
 Isaac Meza is a 53 y.o. male with medical history significant of mild intermittent asthma, HTN, presented with strokelike symptoms.  MRI brain showed Patchy acute small vessel type infarcts in the left corona radiata, tracking toward the posterior left lentiform.  CT angiogram of the neck and head did not show severe stenosis. Echocardiogram showed ejection fraction of 60 to 65%, no valvular abnormality reported, no PFO identified.  Lipid panel showed LDL 47, HDL 69. Patient placed on aspirin, Plavix and Crestor .

## 2024-03-19 ENCOUNTER — Other Ambulatory Visit: Payer: Self-pay

## 2024-03-19 DIAGNOSIS — I10 Essential (primary) hypertension: Secondary | ICD-10-CM | POA: Diagnosis not present

## 2024-03-19 DIAGNOSIS — I6340 Cerebral infarction due to embolism of unspecified cerebral artery: Secondary | ICD-10-CM | POA: Diagnosis not present

## 2024-03-19 DIAGNOSIS — I739 Peripheral vascular disease, unspecified: Secondary | ICD-10-CM | POA: Diagnosis not present

## 2024-03-19 LAB — BASIC METABOLIC PANEL WITH GFR
Anion gap: 12 (ref 5–15)
BUN: 7 mg/dL (ref 6–20)
CO2: 23 mmol/L (ref 22–32)
Calcium: 9.5 mg/dL (ref 8.9–10.3)
Chloride: 105 mmol/L (ref 98–111)
Creatinine, Ser: 0.67 mg/dL (ref 0.61–1.24)
GFR, Estimated: >60 mL/min (ref >60)
Glucose, Bld: 110 mg/dL — ABNORMAL HIGH (ref 70–99)
Potassium: 3.8 mmol/L (ref 3.5–5.1)
Sodium: 139 mmol/L (ref 135–145)

## 2024-03-19 LAB — MAGNESIUM: Magnesium: 2 mg/dL (ref 1.7–2.4)

## 2024-03-19 LAB — PHOSPHORUS: Phosphorus: 3.3 mg/dL (ref 2.5–4.6)

## 2024-03-19 MED ORDER — LISINOPRIL 20 MG PO TABS
20.0000 mg | ORAL_TABLET | Freq: Every day | ORAL | Status: DC
  Administered 2024-03-19: 20 mg via ORAL
  Filled 2024-03-19: qty 1

## 2024-03-19 MED ORDER — AMLODIPINE BESYLATE 5 MG PO TABS
5.0000 mg | ORAL_TABLET | Freq: Every day | ORAL | 0 refills | Status: DC
  Filled 2024-03-19: qty 30, 30d supply, fill #0

## 2024-03-19 MED ORDER — ASPIRIN 81 MG PO TBEC
81.0000 mg | DELAYED_RELEASE_TABLET | Freq: Every day | ORAL | 0 refills | Status: AC
  Filled 2024-03-19: qty 30, 30d supply, fill #0

## 2024-03-19 MED ORDER — CLOPIDOGREL BISULFATE 75 MG PO TABS
75.0000 mg | ORAL_TABLET | Freq: Every day | ORAL | 0 refills | Status: DC
  Filled 2024-03-19: qty 21, 21d supply, fill #0

## 2024-03-19 MED ORDER — AMLODIPINE BESYLATE 10 MG PO TABS
10.0000 mg | ORAL_TABLET | Freq: Every day | ORAL | Status: DC
  Administered 2024-03-19: 10 mg via ORAL
  Filled 2024-03-19: qty 1

## 2024-03-19 MED ORDER — ROSUVASTATIN CALCIUM 20 MG PO TABS
20.0000 mg | ORAL_TABLET | Freq: Every day | ORAL | 0 refills | Status: DC
  Filled 2024-03-19 (×2): qty 90, 90d supply, fill #0

## 2024-03-19 MED ADMIN — Aspirin Tab Delayed Release 81 MG: 81 mg | ORAL | NDC 10135072962

## 2024-03-19 NOTE — Plan of Care (Signed)

## 2024-03-19 NOTE — Progress Notes (Signed)
 Subjective: No new or worsening neurological symptoms overnight.    Objective: Current vital signs: BP (!) 161/112 (BP Location: Right Arm)   Pulse 85   Temp 98.3 F (36.8 C) (Oral)   Resp 18   Ht 5' 8 (1.727 m)   Wt 59 kg   SpO2 100%   BMI 19.77 kg/m  Vital signs in last 24 hours: Temp:  [98 F (36.7 C)-98.6 F (37 C)] 98.3 F (36.8 C) (12/13 0901) Pulse Rate:  [68-96] 85 (12/13 1000) Resp:  [16-18] 18 (12/13 0901) BP: (141-202)/(90-126) 161/112 (12/13 1000) SpO2:  [99 %-100 %] 100 % (12/13 0901)  Intake/Output from previous day: 12/12 0701 - 12/13 0700 In: 0  Out: 1950 [Urine:1950] Intake/Output this shift: Total I/O In: -  Out: 450 [Urine:450] Nutritional status:  Diet Order             Diet - low sodium heart healthy           Diet Heart Fluid consistency: Thin  Diet effective now                   Neurologic Exam: Mental Status: Alert, oriented, thought content appropriate.  Speech fluent without evidence of aphasia.  Able to follow 3 step commands without difficulty.  Dysarthric Cranial Nerves: II: Discs flat bilaterally; Visual fields grossly normal, pupils equal, round, reactive to light and accommodation III,IV, VI: ptosis not present, extra-ocular motions intact bilaterally V,VII: smile symmetric, facial light touch sensation normal bilaterally VIII: hearing normal bilaterally XI: bilateral shoulder shrug XII: midline tongue extension Motor: RUE and RLE drift Sensory: Pinprick and light touch intact throughout, bilaterally Deep Tendon Reflexes: Symmetric throughout Plantars: Right: downgoing                                Left: downgoing Cerebellar: normal finger-to-nose and normal heel-to-shin testing bilaterally Gait: not tested due to safety concerns  Lab Results: Basic Metabolic Panel: Recent Labs  Lab 03/17/24 0120 03/19/24 0544  NA 141 139  K 3.5 3.8  CL 104 105  CO2 25 23  GLUCOSE 114* 110*  BUN 9 7  CREATININE 0.82  0.67  CALCIUM  9.3 9.5  MG  --  2.0  PHOS  --  3.3    Liver Function Tests: Recent Labs  Lab 03/17/24 0120  AST 22  ALT 9  ALKPHOS 83  BILITOT 0.2  PROT 6.9  ALBUMIN 4.5   No results for input(s): LIPASE, AMYLASE in the last 168 hours. No results for input(s): AMMONIA in the last 168 hours.  CBC: Recent Labs  Lab 03/17/24 0120  WBC 8.6  NEUTROABS 3.6  HGB 13.9  HCT 40.3  MCV 93.3  PLT 304    Cardiac Enzymes: No results for input(s): CKTOTAL, CKMB, CKMBINDEX, TROPONINI in the last 168 hours.  Lipid Panel: Recent Labs  Lab 03/18/24 0611  CHOL 139  TRIG 116  HDL 69  CHOLHDL 2.0  VLDL 23  LDLCALC 47    CBG: Recent Labs  Lab 03/17/24 0120  GLUCAP 121*    Microbiology: Results for orders placed or performed during the hospital encounter of 12/06/22  Culture, blood (Routine X 2) w Reflex to ID Panel     Status: None   Collection Time: 12/06/22 11:56 PM   Specimen: BLOOD LEFT ARM  Result Value Ref Range Status   Specimen Description BLOOD LEFT ARM  Final   Special Requests  Final    BOTTLES DRAWN AEROBIC AND ANAEROBIC Blood Culture adequate volume   Culture   Final    NO GROWTH 5 DAYS Performed at [facility], [address] [place], [place] [address]    Report Status 12/12/2022 FINAL  Final  Culture, blood (Routine X 2) w Reflex to ID Panel     Status: None   Collection Time: 12/06/22 11:59 PM   Specimen: BLOOD RIGHT WRIST  Result Value Ref Range Status   Specimen Description BLOOD RIGHT WRIST  Final   Special Requests   Final    BOTTLES DRAWN AEROBIC AND ANAEROBIC Blood Culture adequate volume   Culture   Final    NO GROWTH 5 DAYS Performed at [facility], [address], [place], [place] [address]    Report Status 12/12/2022 FINAL  Final    Coagulation Studies: Recent Labs    03/17/24 0120  LABPROT 12.4  INR 0.9    Imaging: ECHOCARDIOGRAM COMPLETE Result Date: 03/18/2024    ECHOCARDIOGRAM REPORT    Patient Name:   BRANDUN Ayon Date of Exam: 03/17/2024 Medical Rec #:  [id number]       Height:       68.0 in Accession #:    [id number]      Weight:       130.0 lb Date of Birth:  Oct 29, 1970       BSA:          1.702 m Patient Age:    53 years        BP:           166/99 mmHg Patient Gender: M               HR:           63 bpm. Exam Location:  [facility] Procedure: 2D Echo, Cardiac Doppler, Color Doppler and Saline Contrast Bubble            Study (Both Spectral and Color Flow Doppler were utilized during            procedure). Indications:     Stroke I63.9  History:         Patient has no prior history of Echocardiogram examinations.                  Stroke.  Sonographer:     Rosina Dunk Referring Phys:  [id number] CORT ONEIDA MANA Diagnosing Phys: Marsa Dooms MD IMPRESSIONS  1. Left ventricular ejection fraction, by estimation, is 60 to 65%. The left ventricle has normal function. The left ventricle has no regional wall motion abnormalities. Left ventricular diastolic parameters were normal.  2. Right ventricular systolic function is normal. The right ventricular size is normal.  3. The mitral valve is normal in structure. Mild mitral valve regurgitation. No evidence of mitral stenosis.  4. The aortic valve is normal in structure. Aortic valve regurgitation is not visualized. No aortic stenosis is present.  5. The inferior vena cava is normal in size with greater than 50% respiratory variability, suggesting right atrial pressure of 3 mmHg. FINDINGS  Left Ventricle: Left ventricular ejection fraction, by estimation, is 60 to 65%. The left ventricle has normal function. The left ventricle has no regional wall motion abnormalities. Strain was performed and the global longitudinal strain is indeterminate. The left ventricular internal cavity size was normal in size. There is no left ventricular hypertrophy. Left ventricular diastolic parameters were normal. Right Ventricle: The right ventricular size is  normal. No increase in right ventricular wall thickness. Right ventricular systolic function is normal. Left Atrium: Left atrial size was normal in size. Right Atrium: Right atrial size was normal in size. Pericardium: There is no evidence of pericardial effusion. Mitral Valve: The mitral valve is normal in structure. Mild mitral valve regurgitation. No evidence of mitral valve stenosis. MV peak gradient, 4.3 mmHg. The mean mitral valve gradient is 2.0 mmHg. Tricuspid Valve: The tricuspid valve is normal in structure. Tricuspid valve regurgitation is mild . No evidence of tricuspid stenosis. Aortic Valve: The aortic valve is normal in structure. Aortic valve regurgitation is not visualized. No aortic stenosis is present. Aortic valve mean gradient measures 5.0 mmHg. Aortic valve peak gradient measures 10.6 mmHg. Aortic valve area, by VTI measures 3.15 cm. Pulmonic Valve: The pulmonic valve was normal in structure. Pulmonic valve regurgitation is not visualized. No evidence of pulmonic stenosis. Aorta: The aortic root is normal in size and structure. Venous: The inferior vena cava is normal in size with greater than 50% respiratory variability, suggesting right atrial pressure of 3 mmHg. IAS/Shunts: No atrial level shunt detected by color flow Doppler. Agitated saline contrast was given intravenously to evaluate for intracardiac shunting. Additional Comments: 3D was performed not requiring image post processing on an independent workstation and was indeterminate.  LEFT VENTRICLE PLAX 2D LVIDd:         4.20 cm     Diastology LVIDs:         1.90 cm     LV e' medial:    7.40 cm/s LV PW:         1.00 cm     LV E/e' medial:  12.6 LV IVS:        1.00 cm     LV e' lateral:   12.00 cm/s LVOT diam:     2.20 cm     LV E/e' lateral: 7.8 LV SV:         100 LV SV Index:   59 LVOT Area:     3.80 cm LV IVRT:       123 msec  LV Volumes (MOD) LV vol d, MOD A2C: 77.6 ml LV vol d, MOD A4C: 77.2 ml LV vol s, MOD A2C: 24.2 ml LV vol s,  MOD A4C: 29.2 ml LV SV MOD A2C:     53.4 ml LV SV MOD A4C:     77.2 ml LV SV MOD BP:      52.3 ml RIGHT VENTRICLE RV Basal diam:  2.90 cm RV Mid diam:    2.90 cm RV S prime:     12.90 cm/s TAPSE (M-mode): 1.9 cm LEFT ATRIUM             Index        RIGHT ATRIUM           Index LA diam:        2.20 cm 1.29 cm/m   RA Area:     14.90 cm LA Vol (A2C):   42.9 ml 25.21 ml/m  RA Volume:   37.90 ml  22.27 ml/m LA Vol (A4C):   29.3 ml 17.22 ml/m LA Biplane Vol: 38.8 ml 22.80 ml/m  AORTIC VALVE                     PULMONIC VALVE AV Area (Vmax):    3.31 cm      PV Vmax:        1.19 m/s AV Area (Vmean):  3.04 cm      PV Vmean:       85.500 cm/s AV Area (VTI):     3.15 cm      PV VTI:         0.227 m AV Vmax:           163.00 cm/s   PV Peak grad:   5.7 mmHg AV Vmean:          104.000 cm/s  PV Mean grad:   3.0 mmHg AV VTI:            0.316 m       RVOT Peak grad: 2 mmHg AV Peak Grad:      10.6 mmHg AV Mean Grad:      5.0 mmHg LVOT Vmax:         142.00 cm/s LVOT Vmean:        83.300 cm/s LVOT VTI:          0.262 m LVOT/AV VTI ratio: 0.83  AORTA Ao Root diam: 3.30 cm Ao Asc diam:  2.40 cm MITRAL VALVE MV Area (PHT): 3.17 cm    SHUNTS MV Area VTI:   2.84 cm    Systemic VTI:  0.26 m MV Peak grad:  4.3 mmHg    Systemic Diam: 2.20 cm MV Mean grad:  2.0 mmHg    Pulmonic VTI:  0.158 m MV Vmax:       1.04 m/s MV Vmean:      61.2 cm/s MV Decel Time: 239 msec MV E velocity: 93.30 cm/s MV A velocity: 89.60 cm/s MV E/A ratio:  1.04 Marsa Dooms MD Electronically signed by Marsa Dooms MD Signature Date/Time: 03/18/2024/1:11:37 PM    Final     Medications: I have reviewed the patient's current medications. Scheduled:  amLODipine   10 mg Oral Daily   aspirin  EC  81 mg Oral Daily   clopidogrel   75 mg Oral Daily   enoxaparin  (LOVENOX ) injection  40 mg Subcutaneous Q24H   folic acid   1 mg Oral Daily   lisinopril   20 mg Oral Daily   multivitamin with minerals  1 tablet Oral Daily   rosuvastatin   10 mg Oral QPM    thiamine   100 mg Oral Daily   Or   thiamine   100 mg Intravenous Daily    Assessment/Plan: 53 y.o. male with medical history significant of asthma and HTN who presented with acute onset right sided weakness and slurred speech.  Was not a thrombolytic or thrombectomy candidate due to being outside of the treatment window, nondisabling symptoms and without target lesion identified on imaging. 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  Etiology small vessel disease versus embolic.  CTA of the head and neck personally reviewed and reveals moderate bilateral ICA paraclinoid stenosis.  A1c 5.4, LDL 47.  Echocardiogram reveals EF of 60-65% with no cardiac source of emboli noted.   Patient stable neurologically   Recommendations: BP control.  Goal BP<140/80 Agree with dual antiplatelet therapy Would increase statin to high intensity (20mg ) PT/OT and speech therapy   LOS: 1 day   Sonny Hock, MD Neurology  03/19/2024  10:44 AM

## 2024-03-19 NOTE — Discharge Summary (Signed)
 Physician Discharge Summary   Patient: Isaac Meza MRN: [id number] DOB: [birth year]  Admit date:     03/17/2024  Discharge date: 03/19/2024  Discharge Physician: Isaac Meza   PCP: Isaac Fend, DO   Recommendations at discharge:   Follow-up with PCP in 1 week. Follow-up with neurology in 1 month.  Discharge Diagnoses: Principal Problem:   CVA (cerebral vascular accident) [facility]) Active Problems:   Chronic pancreatitis (HCC)   Essential hypertension   Alcohol use disorder   Stroke [facility])  Resolved Problems:   * No resolved hospital problems. *  Hospital Course: Isaac Meza is a 53 y.o. male with medical history significant of mild intermittent asthma, HTN, presented with strokelike symptoms.  MRI brain showed Patchy acute small vessel type infarcts in the left corona radiata, tracking toward the posterior left lentiform.  CT angiogram of the neck and head did not show severe stenosis. Echocardiogram showed ejection fraction of 60 to 65%, no valvular abnormality reported, no PFO identified.  Lipid panel showed LDL 47, HDL 69. Patient placed on aspirin , Plavix  and Crestor .   Assessment and Plan: Acute stroke. Patient has significant dysarthria, PT/OT has eval the patient, he was able to walk, no additional therapy needed. Discussed with neurology, will continue aspirin  indefinitely, Plavix  for 21 days, increase dose of Crestor  to 20 mg daily.   Essential hypertension. Restarted lisinopril , also added amlodipine .  Blood pressure seems to be better.  Medically stable for discharge.   Alcohol use disorder with intoxication. Patient did not develop withdrawal symptoms.  Advised to quit.        Consultants: Neurology Procedures performed: None  Disposition: Home Diet recommendation:  Discharge Diet Orders (From admission, onward)     Start     Ordered   03/19/24 0000  Diet - low sodium heart healthy        03/19/24 1043           Cardiac  diet DISCHARGE MEDICATION: Allergies as of 03/19/2024   No Known Allergies      Medication List     TAKE these medications    amLODipine  5 MG tablet Commonly known as: NORVASC  Take 1 tablet (5 mg total) by mouth daily.   aspirin  EC 81 MG tablet Take 1 tablet (81 mg total) by mouth daily. Swallow whole. Start taking on: March 20, 2024   clopidogrel  75 MG tablet Commonly known as: PLAVIX  Take 1 tablet (75 mg total) by mouth daily for 21 days. Start taking on: March 20, 2024   lisinopril  20 MG tablet Commonly known as: ZESTRIL  Take 1 tablet (20 mg total) by mouth daily.   OVER THE COUNTER MEDICATION Take 1 tablet by mouth as directed. OTC PAIN MEDICATION   rosuvastatin  10 MG tablet Commonly known as: Crestor  Take 2 tablets (20 mg total) by mouth daily. What changed: how much to take        Follow-up Information     Isaac Fend, DO Follow up.   Specialty: Internal Medicine Why: hospital follow up Contact information: [address] [place] [address] [phone]         [facility] Follow up in 1 month(s).   Contact information: [address] [place]  [address] [phone]               Discharge Exam: Isaac Meza   03/17/24 0219  Weight: 59 kg   General exam: Appears calm and comfortable  Respiratory system: Clear to auscultation. Respiratory  effort normal. Cardiovascular system: S1 & S2 heard, RRR. No JVD, murmurs, rubs, gallops or clicks. No pedal edema. Gastrointestinal system: Abdomen is nondistended, soft and nontender. No organomegaly or masses felt. Normal bowel sounds heard. Central nervous system: Alert and oriented.  Dysarthria improving. Extremities: Symmetric 5 x 5 power. Skin: No rashes, lesions or ulcers Psychiatry: Judgement and insight appear normal. Mood & affect appropriate.    Condition at discharge: good  The results of  significant diagnostics from this hospitalization (including imaging, microbiology, ancillary and laboratory) are listed below for reference.   Imaging Studies: ECHOCARDIOGRAM COMPLETE Result Date: 03/18/2024    ECHOCARDIOGRAM REPORT   Patient Name:   Isaac Meza Date of Exam: 03/17/2024 Medical Rec #:  [id number]       Height:       68.0 in Accession #:    [id number]      Weight:       130.0 lb Date of Birth:  08-Jul-1970       BSA:          1.702 m Patient Age:    53 years        BP:           166/99 mmHg Patient Gender: M               HR:           63 bpm. Exam Location:  [facility] Procedure: 2D Echo, Cardiac Doppler, Color Doppler and Saline Contrast Bubble            Study (Both Spectral and Color Flow Doppler were utilized during            procedure). Indications:     Stroke I63.9  History:         Patient has no prior history of Echocardiogram examinations.                  Stroke.  Sonographer:     Rosina Dunk Referring Phys:  [id number] Isaac Meza Diagnosing Phys: Isaac Dooms MD IMPRESSIONS  1. Left ventricular ejection fraction, by estimation, is 60 to 65%. The left ventricle has normal function. The left ventricle has no regional wall motion abnormalities. Left ventricular diastolic parameters were normal.  2. Right ventricular systolic function is normal. The right ventricular size is normal.  3. The mitral valve is normal in structure. Mild mitral valve regurgitation. No evidence of mitral stenosis.  4. The aortic valve is normal in structure. Aortic valve regurgitation is not visualized. No aortic stenosis is present.  5. The inferior vena cava is normal in size with greater than 50% respiratory variability, suggesting right atrial pressure of 3 mmHg. FINDINGS  Left Ventricle: Left ventricular ejection fraction, by estimation, is 60 to 65%. The left ventricle has normal function. The left ventricle has no regional wall motion abnormalities. Strain was performed and the global  longitudinal strain is indeterminate. The left ventricular internal cavity size was normal in size. There is no left ventricular hypertrophy. Left ventricular diastolic parameters were normal. Right Ventricle: The right ventricular size is normal. No increase in right ventricular wall thickness. Right ventricular systolic function is normal. Left Atrium: Left atrial size was normal in size. Right Atrium: Right atrial size was normal in size. Pericardium: There is no evidence of pericardial effusion. Mitral Valve: The mitral valve is normal in structure. Mild mitral valve regurgitation. No evidence of mitral valve stenosis. MV peak gradient, 4.3 mmHg. The mean mitral valve gradient is  2.0 mmHg. Tricuspid Valve: The tricuspid valve is normal in structure. Tricuspid valve regurgitation is mild . No evidence of tricuspid stenosis. Aortic Valve: The aortic valve is normal in structure. Aortic valve regurgitation is not visualized. No aortic stenosis is present. Aortic valve mean gradient measures 5.0 mmHg. Aortic valve peak gradient measures 10.6 mmHg. Aortic valve area, by VTI measures 3.15 cm. Pulmonic Valve: The pulmonic valve was normal in structure. Pulmonic valve regurgitation is not visualized. No evidence of pulmonic stenosis. Aorta: The aortic root is normal in size and structure. Venous: The inferior vena cava is normal in size with greater than 50% respiratory variability, suggesting right atrial pressure of 3 mmHg. IAS/Shunts: No atrial level shunt detected by color flow Doppler. Agitated saline contrast was given intravenously to evaluate for intracardiac shunting. Additional Comments: 3D was performed not requiring image post processing on an independent workstation and was indeterminate.  LEFT VENTRICLE PLAX 2D LVIDd:         4.20 cm     Diastology LVIDs:         1.90 cm     LV e' medial:    7.40 cm/s LV PW:         1.00 cm     LV E/e' medial:  12.6 LV IVS:        1.00 cm     LV e' lateral:   12.00 cm/s  LVOT diam:     2.20 cm     LV E/e' lateral: 7.8 LV SV:         100 LV SV Index:   59 LVOT Area:     3.80 cm LV IVRT:       123 msec  LV Volumes (MOD) LV vol d, MOD A2C: 77.6 ml LV vol d, MOD A4C: 77.2 ml LV vol s, MOD A2C: 24.2 ml LV vol s, MOD A4C: 29.2 ml LV SV MOD A2C:     53.4 ml LV SV MOD A4C:     77.2 ml LV SV MOD BP:      52.3 ml RIGHT VENTRICLE RV Basal diam:  2.90 cm RV Mid diam:    2.90 cm RV S prime:     12.90 cm/s TAPSE (M-mode): 1.9 cm LEFT ATRIUM             Index        RIGHT ATRIUM           Index LA diam:        2.20 cm 1.29 cm/m   RA Area:     14.90 cm LA Vol (A2C):   42.9 ml 25.21 ml/m  RA Volume:   37.90 ml  22.27 ml/m LA Vol (A4C):   29.3 ml 17.22 ml/m LA Biplane Vol: 38.8 ml 22.80 ml/m  AORTIC VALVE                     PULMONIC VALVE AV Area (Vmax):    3.31 cm      PV Vmax:        1.19 m/s AV Area (Vmean):   3.04 cm      PV Vmean:       85.500 cm/s AV Area (VTI):     3.15 cm      PV VTI:         0.227 m AV Vmax:           163.00 cm/s   PV Peak grad:   5.7 mmHg AV Vmean:  104.000 cm/s  PV Mean grad:   3.0 mmHg AV VTI:            0.316 m       RVOT Peak grad: 2 mmHg AV Peak Grad:      10.6 mmHg AV Mean Grad:      5.0 mmHg LVOT Vmax:         142.00 cm/s LVOT Vmean:        83.300 cm/s LVOT VTI:          0.262 m LVOT/AV VTI ratio: 0.83  AORTA Ao Root diam: 3.30 cm Ao Asc diam:  2.40 cm MITRAL VALVE MV Area (PHT): 3.17 cm    SHUNTS MV Area VTI:   2.84 cm    Systemic VTI:  0.26 m MV Peak grad:  4.3 mmHg    Systemic Diam: 2.20 cm MV Mean grad:  2.0 mmHg    Pulmonic VTI:  0.158 m MV Vmax:       1.04 m/s MV Vmean:      61.2 cm/s MV Decel Time: 239 msec MV E velocity: 93.30 cm/s MV A velocity: 89.60 cm/s MV E/A ratio:  1.04 Isaac Dooms MD Electronically signed by Isaac Dooms MD Signature Date/Time: 03/18/2024/1:11:37 PM    Final    MR BRAIN WO CONTRAST Result Date: 03/17/2024 EXAM: MR Brain without Intravenous Contrast. CLINICAL HISTORY: 53 year old male with acute  neurological deficit, stroke code presentation this morning. TECHNIQUE: Magnetic resonance images of the brain without intravenous contrast in multiple planes. CONTRAST: Without. COMPARISON: CT head, CTA, and CTP earlier today, 03/17/2024. FINDINGS: BRAIN: Subtle heterogeneous abnormal diffusion in the left corona radiata, tracking toward the posterior left lentiform (series 5 images 29 through 30) with punctate foci of restriction. No associated hemorrhage or mass effect. T2 heterogeneity in the bilateral deep gray nuclei compatible with multiple chronic lacunar infarcts, including thalamic involvement as seen on series 17 image 85. Small area of chronic left periventricular white matter lacunar infarction or encephalomalacia suspected on series 8 image 17. No cortical encephalomalacia or chronic cerebral blood products identified. Brainstem and cerebellum appear negative. No intracranial hemorrhage. No midline shift or extra-axial fluid collection. No cerebellar tonsillar ectopia. The central arterial and venous flow voids are patent. VENTRICLES: No hydrocephalus. ORBITS: The orbits are normal. SINUSES AND MASTOIDS: Bilateral paranasal sinuses mucosal thickening and opacification is stable. Small volume retained secretions in the nasopharynx on series 8 image 4. Mastoids are well aerated. Grossly normal visible internal auditory structures. BONES: Background bone marrow signal within normal limits. Exaggerated cervical lordosis redemonstrated. Associated cervical facet arthropathy better demonstrated by CT. No acute fracture or focal osseous lesion. IMPRESSION: 1. Patchy acute small vessel type infarcts in the left corona radiata, tracking toward the posterior left lentiform. No associated hemorrhage or mass effect. 2. 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 Electronically signed by: Helayne Hurst MD 03/17/2024 09:23 AM EST RP Workstation: [id number]    CT ANGIO HEAD NECK W WO CM W PERF (CODE STROKE) Result Date: 03/17/2024 EXAM: CTA Head and Neck with Perfusion 03/17/2024 02:16:07 AM TECHNIQUE: CTA of the head and neck was performed with the administration of intravenous contrast. 3D postprocessing with multiplanar reconstructions and MIPs was performed to evaluate the vascular anatomy. Cerebral perfusion analysis using computed tomography with contrast administration, including post-processing of parametric maps with determination of cerebral blood flow, cerebral blood volume, mean transit time and time-to-maximum. Automated exposure control, iterative reconstruction, and/or weight  based adjustment of the mA/kV was utilized to reduce the radiation dose to as low as reasonably achievable. COMPARISON: None available CLINICAL HISTORY: Neuro deficit, acute, stroke suspected Neuro deficit, acute, stroke suspected FINDINGS: AORTIC ARCH AND ARCH VESSELS: No dissection or arterial injury. No significant stenosis of the brachiocephalic or subclavian arteries. CERVICAL CAROTID ARTERIES: No dissection, arterial injury, or hemodynamically significant stenosis by NASCET criteria. CERVICAL VERTEBRAL ARTERIES: No dissection, arterial injury, or significant stenosis. LUNGS AND MEDIASTINUM: Unremarkable. SOFT TISSUES: No acute abnormality. BONES: No acute abnormality. ANTERIOR CIRCULATION: Internal carotid arteries are patent with moderate bilateral paraclinoid stenosis. No significant stenosis of the anterior cerebral arteries. No significant stenosis of the middle cerebral arteries. No aneurysm. POSTERIOR CIRCULATION: No significant stenosis of the posterior cerebral arteries. No significant stenosis of the basilar artery. No significant stenosis of the vertebral arteries. No aneurysm. OTHER: No dural venous sinus thrombosis on this non-dedicated study. EXAM QUALITY: Exam quality is adequate with diagnostic perfusion maps. No significant motion artifact. Appropriate  arterial inflow and venous outflow curves. CORE INFARCT (CBF<30% volume): 0 mL TOTAL HYPOPERFUSION (Tmax>6s volume): 0 mL Mismatch volume: 0 mL Mismatch ratio: not applicable Location: not applicable IMPRESSION: 1. No acute large vessel occlusion. 2. Moderate bilateral paraclinoid ICA stenosis. 3. No evidence of core infarct or penumbra on CT perfusion. Electronically signed by: Gilmore Molt MD 03/17/2024 02:57 AM EST RP Workstation: [id number]   CT CERVICAL SPINE WO CONTRAST Result Date: 03/17/2024 EXAM: CT CERVICAL SPINE WITHOUT CONTRAST 03/17/2024 02:06:06 AM TECHNIQUE: CT of the cervical spine was performed without the administration of intravenous contrast. Multiplanar reformatted images are provided for review. Automated exposure control, iterative reconstruction, and/or weight based adjustment of the mA/kV was utilized to reduce the radiation dose to as low as reasonably achievable. COMPARISON: None available. CLINICAL HISTORY: Neuro deficit, acute, stroke suspected. FINDINGS: CERVICAL SPINE: BONES AND ALIGNMENT: No acute fracture or traumatic malalignment. DEGENERATIVE CHANGES: Advanced bilateral degenerative facet disease diffusely. SOFT TISSUES: No prevertebral soft tissue swelling. IMPRESSION: 1. No acute abnormality of the cervical spine. 2. Advanced bilateral degenerative facet disease. Electronically signed by: Franky Crease MD 03/17/2024 02:22 AM EST RP Workstation: [id number]   CT HEAD CODE STROKE WO CONTRAST` Result Date: 03/17/2024 EXAM: CT HEAD WITHOUT 03/17/2024 02:02:17 AM TECHNIQUE: CT of the head was performed without the administration of intravenous contrast. Automated exposure control, iterative reconstruction, and/or weight based adjustment of the mA/kV was utilized to reduce the radiation dose to as low as reasonably achievable. COMPARISON: None available. CLINICAL HISTORY: Neuro deficit, acute, stroke suspected FINDINGS: BRAIN AND VENTRICLES: No acute intracranial hemorrhage. No  mass effect or midline shift. No extra-axial fluid collection. No evidence of acute infarct. Small remote right thalamic and bilateral corona radiata lacunar infarcts. Patchy white matter hypodensities, compatible with chronic microvascular ischemic disease. No hydrocephalus. ORBITS: No acute abnormality. SINUSES AND MASTOIDS: No acute abnormality. SOFT TISSUES AND SKULL: No acute skull fracture. Findings discussed with Dr. Claudene via telephone at 2:08 PM. IMPRESSION: 1. No acute intracranial abnormality.  ASPECTS 10. 2. Small remote right thalamic and bilateral corona radiata lacunar infarcts. Electronically signed by: Gilmore Molt MD 03/17/2024 02:10 AM EST RP Workstation: [id number]    Microbiology: Results for orders placed or performed during the hospital encounter of 12/06/22  Culture, blood (Routine X 2) w Reflex to ID Panel     Status: None   Collection Time: 12/06/22 11:56 PM   Specimen: BLOOD LEFT ARM  Result Value Ref Range Status   Specimen Description BLOOD LEFT ARM  Final   Special Requests   Final    BOTTLES DRAWN AEROBIC AND ANAEROBIC Blood Culture adequate volume   Culture   Final    NO GROWTH 5 DAYS Performed at [facility], [address], [place], [place] [address]    Report Status 12/12/2022 FINAL  Final  Culture, blood (Routine X 2) w Reflex to ID Panel     Status: None   Collection Time: 12/06/22 11:59 PM   Specimen: BLOOD RIGHT WRIST  Result Value Ref Range Status   Specimen Description BLOOD RIGHT WRIST  Final   Special Requests   Final    BOTTLES DRAWN AEROBIC AND ANAEROBIC Blood Culture adequate volume   Culture   Final    NO GROWTH 5 DAYS Performed at [facility], [address], [place], [place] [address]    Report Status 12/12/2022 FINAL  Final    Labs: CBC: Recent Labs  Lab 03/17/24 0120  WBC 8.6  NEUTROABS 3.6  HGB 13.9  HCT 40.3  MCV 93.3  PLT 304   Basic Metabolic Panel: Recent Labs  Lab 03/17/24 0120 03/19/24 0544  NA  141 139  K 3.5 3.8  CL 104 105  CO2 25 23  GLUCOSE 114* 110*  BUN 9 7  CREATININE 0.82 0.67  CALCIUM  9.3 9.5  MG  --  2.0  PHOS  --  3.3   Liver Function Tests: Recent Labs  Lab 03/17/24 0120  AST 22  ALT 9  ALKPHOS 83  BILITOT 0.2  PROT 6.9  ALBUMIN 4.5   CBG: Recent Labs  Lab 03/17/24 0120  GLUCAP 121*    Discharge time spent: 35 minutes.  Signed: Murvin Mana, MD [facility] 03/19/2024

## 2024-03-20 ENCOUNTER — Encounter: Payer: Self-pay | Admitting: Internal Medicine

## 2024-03-21 ENCOUNTER — Telehealth: Payer: Self-pay

## 2024-03-21 DIAGNOSIS — I639 Cerebral infarction, unspecified: Secondary | ICD-10-CM

## 2024-03-21 NOTE — Telephone Encounter (Signed)
 Copied from CRM #[id number]. Topic: General - Other >> Mar 21, 2024  8:42 AM Rosina BIRCH wrote: Reason for CRM: patient wife returning a call to the office and she stated she may have to take him back to the hospital because he can not get around. She has to go back to work tomorrow and she will try to get him readmitted and get him into rehab 336 [phone]

## 2024-03-23 ENCOUNTER — Emergency Department (HOSPITAL_COMMUNITY)

## 2024-03-23 ENCOUNTER — Encounter (HOSPITAL_COMMUNITY): Payer: Self-pay

## 2024-03-23 ENCOUNTER — Inpatient Hospital Stay (HOSPITAL_COMMUNITY)
Admission: EM | Admit: 2024-03-23 | Discharge: 2024-03-28 | DRG: 065 | Disposition: A | Discharge status: Rehabilitation Facility | Attending: Internal Medicine | Admitting: Internal Medicine

## 2024-03-23 ENCOUNTER — Other Ambulatory Visit: Payer: Self-pay

## 2024-03-23 DIAGNOSIS — Z7902 Long term (current) use of antithrombotics/antiplatelets: Secondary | ICD-10-CM

## 2024-03-23 DIAGNOSIS — E785 Hyperlipidemia, unspecified: Secondary | ICD-10-CM | POA: Diagnosis present

## 2024-03-23 DIAGNOSIS — I69391 Dysphagia following cerebral infarction: Secondary | ICD-10-CM

## 2024-03-23 DIAGNOSIS — Z7982 Long term (current) use of aspirin: Secondary | ICD-10-CM

## 2024-03-23 DIAGNOSIS — R29712 NIHSS score 12: Secondary | ICD-10-CM | POA: Diagnosis present

## 2024-03-23 DIAGNOSIS — I6381 Other cerebral infarction due to occlusion or stenosis of small artery: Principal | ICD-10-CM | POA: Diagnosis present

## 2024-03-23 DIAGNOSIS — I10 Essential (primary) hypertension: Secondary | ICD-10-CM | POA: Diagnosis present

## 2024-03-23 DIAGNOSIS — Z79899 Other long term (current) drug therapy: Secondary | ICD-10-CM

## 2024-03-23 DIAGNOSIS — R29708 NIHSS score 8: Secondary | ICD-10-CM | POA: Diagnosis present

## 2024-03-23 DIAGNOSIS — W19XXXA Unspecified fall, initial encounter: Secondary | ICD-10-CM | POA: Diagnosis present

## 2024-03-23 DIAGNOSIS — I639 Cerebral infarction, unspecified: Secondary | ICD-10-CM | POA: Diagnosis not present

## 2024-03-23 DIAGNOSIS — R1310 Dysphagia, unspecified: Secondary | ICD-10-CM | POA: Diagnosis present

## 2024-03-23 DIAGNOSIS — F1010 Alcohol abuse, uncomplicated: Secondary | ICD-10-CM | POA: Diagnosis present

## 2024-03-23 DIAGNOSIS — M25551 Pain in right hip: Secondary | ICD-10-CM | POA: Diagnosis present

## 2024-03-23 DIAGNOSIS — Z8673 Personal history of transient ischemic attack (TIA), and cerebral infarction without residual deficits: Secondary | ICD-10-CM | POA: Diagnosis not present

## 2024-03-23 DIAGNOSIS — R4701 Aphasia: Secondary | ICD-10-CM | POA: Diagnosis present

## 2024-03-23 DIAGNOSIS — G510 Bell's palsy: Secondary | ICD-10-CM | POA: Diagnosis present

## 2024-03-23 DIAGNOSIS — Z043 Encounter for examination and observation following other accident: Secondary | ICD-10-CM | POA: Diagnosis not present

## 2024-03-23 DIAGNOSIS — I69351 Hemiplegia and hemiparesis following cerebral infarction affecting right dominant side: Secondary | ICD-10-CM

## 2024-03-23 DIAGNOSIS — J45909 Unspecified asthma, uncomplicated: Secondary | ICD-10-CM | POA: Diagnosis present

## 2024-03-23 DIAGNOSIS — F17210 Nicotine dependence, cigarettes, uncomplicated: Secondary | ICD-10-CM | POA: Diagnosis present

## 2024-03-23 LAB — CBC WITH DIFFERENTIAL/PLATELET
Abs Immature Granulocytes: 0.04 K/uL (ref 0.00–0.07)
Basophils Absolute: 0 K/uL (ref 0.0–0.1)
Basophils Relative: 0 %
Eosinophils Absolute: 0 K/uL (ref 0.0–0.5)
Eosinophils Relative: 0 %
HCT: 35 % — ABNORMAL LOW (ref 39.0–52.0)
Hemoglobin: 12.1 g/dL — ABNORMAL LOW (ref 13.0–17.0)
Immature Granulocytes: 0 %
Lymphocytes Relative: 20 %
Lymphs Abs: 2.3 K/uL (ref 0.7–4.0)
MCH: 32.5 pg (ref 26.0–34.0)
MCHC: 34.6 g/dL (ref 30.0–36.0)
MCV: 94.1 fL (ref 80.0–100.0)
Monocytes Absolute: 1.3 K/uL — ABNORMAL HIGH (ref 0.1–1.0)
Monocytes Relative: 11 %
Neutro Abs: 8.1 K/uL — ABNORMAL HIGH (ref 1.7–7.7)
Neutrophils Relative %: 69 %
Platelets: 303 K/uL (ref 150–400)
RBC: 3.72 MIL/uL — ABNORMAL LOW (ref 4.22–5.81)
RDW: 12.2 % (ref 11.5–15.5)
WBC: 11.8 K/uL — ABNORMAL HIGH (ref 4.0–10.5)
nRBC: 0 % (ref 0.0–0.2)

## 2024-03-23 LAB — URINALYSIS, ROUTINE W REFLEX MICROSCOPIC
Bacteria, UA: NONE SEEN
Bilirubin Urine: NEGATIVE
Glucose, UA: NEGATIVE mg/dL
Hgb urine dipstick: NEGATIVE
Ketones, ur: NEGATIVE mg/dL
Leukocytes,Ua: NEGATIVE
Nitrite: NEGATIVE
Protein, ur: 30 mg/dL — AB
Specific Gravity, Urine: 1.025 (ref 1.005–1.030)
pH: 5 (ref 5.0–8.0)

## 2024-03-23 LAB — BASIC METABOLIC PANEL WITH GFR
Anion gap: 11 (ref 5–15)
BUN: 11 mg/dL (ref 6–20)
CO2: 25 mmol/L (ref 22–32)
Calcium: 9.1 mg/dL (ref 8.9–10.3)
Chloride: 100 mmol/L (ref 98–111)
Creatinine, Ser: 0.63 mg/dL (ref 0.61–1.24)
GFR, Estimated: >60 mL/min (ref >60)
Glucose, Bld: 125 mg/dL — ABNORMAL HIGH (ref 70–99)
Potassium: 4.1 mmol/L (ref 3.5–5.1)
Sodium: 136 mmol/L (ref 135–145)

## 2024-03-23 MED ORDER — LISINOPRIL 20 MG PO TABS
20.0000 mg | ORAL_TABLET | Freq: Every day | ORAL | Status: DC
  Administered 2024-03-23 – 2024-03-24 (×2): 20 mg via ORAL
  Filled 2024-03-23 (×2): qty 1

## 2024-03-23 MED ORDER — ROSUVASTATIN CALCIUM 20 MG PO TABS
20.0000 mg | ORAL_TABLET | Freq: Every day | ORAL | Status: DC
  Administered 2024-03-23 – 2024-03-28 (×6): 20 mg via ORAL
  Filled 2024-03-23 (×6): qty 1

## 2024-03-23 MED ORDER — ASPIRIN 81 MG PO TBEC
81.0000 mg | DELAYED_RELEASE_TABLET | Freq: Every day | ORAL | Status: DC
  Administered 2024-03-23 – 2024-03-28 (×6): 81 mg via ORAL
  Filled 2024-03-23 (×6): qty 1

## 2024-03-23 MED ORDER — AMLODIPINE BESYLATE 5 MG PO TABS
5.0000 mg | ORAL_TABLET | Freq: Every day | ORAL | Status: DC
  Administered 2024-03-23 – 2024-03-24 (×2): 5 mg via ORAL
  Filled 2024-03-23 (×2): qty 1

## 2024-03-23 MED ORDER — CLOPIDOGREL BISULFATE 75 MG PO TABS
75.0000 mg | ORAL_TABLET | Freq: Every day | ORAL | Status: DC
  Administered 2024-03-23 – 2024-03-25 (×3): 75 mg via ORAL
  Filled 2024-03-23 (×3): qty 1

## 2024-03-23 NOTE — ED Provider Notes (Signed)
 [place] EMERGENCY DEPARTMENT AT [facility] Provider Note   CSN: [id number] Arrival date & time: 03/23/24  1052     Patient presents with: Fall and Extremity Weakness   Isaac Meza is a 53 y.o. male.    Fall  Extremity Weakness     Patient has history of hypertension and asthma.  He was also recently admitted to the hospital on December 11 for a stroke.  Patient was discharged on the 13th.  Patient was instructed to continue aspirin  and Plavix .  Patient and spouse state that when he left the hospital he had weakness in his leg but he was able to get around and walk.  Since leaving the hospital however his speech has actually gotten worse.  He is also having increasing leg weakness.  He is now barely able to move his leg.  He did fall.  Prior to Admission medications  Medication Sig Start Date End Date Taking? Authorizing Provider  amLODipine  (NORVASC ) 5 MG tablet Take 1 tablet (5 mg total) by mouth daily. 03/19/24 03/19/25 Yes Laurita Pillion, MD  aspirin  EC 81 MG tablet Take 1 tablet (81 mg total) by mouth daily. Swallow whole. 03/20/24  Yes Laurita Pillion, MD  clopidogrel  (PLAVIX ) 75 MG tablet Take 1 tablet (75 mg total) by mouth daily for 21 days. 03/20/24 04/10/24 Yes Laurita Pillion, MD  naproxen sodium (ALEVE) 220 MG tablet Take 440 mg by mouth daily as needed (pain).   Yes [provider]  rosuvastatin  (CRESTOR ) 20 MG tablet Take 1 tablet (20 mg total) by mouth daily. 03/19/24  Yes Laurita Pillion, MD  lisinopril  (ZESTRIL ) 20 MG tablet Take 1 tablet (20 mg total) by mouth daily. Patient not taking: Reported on 03/23/2024 11/24/23   [name], DO    Allergies: Patient has no known allergies.    Review of Systems  Musculoskeletal:  Positive for extremity weakness.    Updated Vital Signs BP (!) 155/102   Pulse 88   Temp 97.9 F (36.6 C) (Oral)   Resp 17   SpO2 100%   Physical Exam Vitals and nursing note reviewed.  Constitutional:       Appearance: He is well-developed. He is not diaphoretic.  HENT:     Head: Normocephalic and atraumatic.     Right Ear: External ear normal.     Left Ear: External ear normal.  Eyes:     General: No scleral icterus.       Right eye: No discharge.        Left eye: No discharge.     Conjunctiva/sclera: Conjunctivae normal.  Neck:     Trachea: No tracheal deviation.  Cardiovascular:     Rate and Rhythm: Normal rate and regular rhythm.  Pulmonary:     Effort: Pulmonary effort is normal. No respiratory distress.     Breath sounds: Normal breath sounds. No stridor. No wheezing or rales.  Abdominal:     General: Bowel sounds are normal. There is no distension.     Palpations: Abdomen is soft.     Tenderness: There is no abdominal tenderness. There is no guarding or rebound.  Musculoskeletal:        General: No tenderness or deformity.     Cervical back: Neck supple.  Skin:    General: Skin is warm and dry.     Findings: No rash.  Neurological:     General: No focal deficit present.     Mental Status: He is alert.  Cranial Nerves: Dysarthria and facial asymmetry present. No cranial nerve deficit.     Sensory: No sensory deficit.     Motor: Weakness present. No abnormal muscle tone or seizure activity.     Coordination: Coordination normal.     Comments: Patient has decreased grip strength in the right hand compared to the left, patient is not able to lift his right leg off the bed but he is able to do so with the left  Psychiatric:        Mood and Affect: Mood normal.     (all labs ordered are listed, but only abnormal results are displayed) Labs Reviewed  CBC WITH DIFFERENTIAL/PLATELET - Abnormal; Notable for the following components:      Result Value   WBC 11.8 (*)    RBC 3.72 (*)    Hemoglobin 12.1 (*)    HCT 35.0 (*)    Neutro Abs 8.1 (*)    Monocytes Absolute 1.3 (*)    All other components within normal limits  BASIC METABOLIC PANEL WITH GFR - Abnormal; Notable for  the following components:   Glucose, Bld 125 (*)    All other components within normal limits  URINALYSIS, ROUTINE W REFLEX MICROSCOPIC - Abnormal; Notable for the following components:   APPearance HAZY (*)    Protein, ur 30 (*)    All other components within normal limits    EKG: None  Radiology: MR BRAIN WO CONTRAST Result Date: 03/23/2024 CLINICAL DATA:  Initial evaluation for acute increase in weakness, recent stroke. EXAM: MRI HEAD WITHOUT CONTRAST TECHNIQUE: Multiplanar, multiecho pulse sequences of the brain and surrounding structures were obtained without intravenous contrast. COMPARISON:  CT from earlier the same day as well as recent MRI from 03/17/2024. FINDINGS: Brain: Examination mildly degraded by motion artifact. Cerebral volume within normal limits. Patchy T2/FLAIR hyperintensity involving the supratentorial cerebral white matter, most characteristic of chronic microvascular ischemic disease, mild in nature. Remote lacunar infarcts present about the bilateral basal ganglia and thalami. There has been interval expansion and worsening in previously identified posterior left basal ganglia infarct, now measuring up to 2.9 cm in size (series 2, image 32). No associated hemorrhage or significant regional mass effect. No other areas of new or interval ischemia elsewhere. Gray-white matter differentiation otherwise maintained. No other acute or chronic intracranial blood products. No mass lesion, midline shift or mass effect. No hydrocephalus or extra-axial fluid collection. Pituitary gland within normal limits. Vascular: Major intracranial vascular flow voids are maintained. Skull and upper cervical spine: Cranial junction within normal limits. Exaggeration of the normal cervical lordosis noted. No scalp soft tissue abnormality. Sinuses/Orbits: Globes orbital soft tissues within normal limits. Chronic left ethmoidal and left maxillary sinusitis, left OM U obstructive pattern. Trace right  mastoid effusion, of doubtful significance. Other: None. IMPRESSION: 1. Interval expansion and worsening in recently identified posterior left basal ganglia infarct, now measuring up to 2.9 cm in size. No associated hemorrhage or significant regional mass effect. 2. No other new intracranial abnormality. 3. Underlying mild chronic microvascular ischemic disease with remote lacunar infarcts about the bilateral basal ganglia and thalami. 4. Chronic left ethmoidal and left maxillary sinusitis, left OMU obstructive pattern. Electronically Signed   By: Morene Hoard M.D.   On: 03/23/2024 19:15   CT Head Wo Contrast Result Date: 03/23/2024 CLINICAL DATA:  Known stroke follow-up. EXAM: CT HEAD WITHOUT CONTRAST TECHNIQUE: Contiguous axial images were obtained from the base of the skull through the vertex without intravenous contrast. RADIATION DOSE REDUCTION:  This exam was performed according to the departmental dose-optimization program which includes automated exposure control, adjustment of the mA and/or kV according to patient size and/or use of iterative reconstruction technique. COMPARISON:  Head CT 03/17/2024 and MRI brain 03/17/2024. FINDINGS: Brain: Ventricles, cisterns and other CSF spaces are normal. Evidence of patient's known subacute infarct over the left corona radiata as seen on recent MRI. Suggestion of tiny lacunar infarct over the right thalamus and left basal ganglia. No significant mass effect or midline shift. No acute hemorrhage. Remainder the exam is unchanged. Vascular: No hyperdense vessel or unexpected calcification. Skull: Normal. Negative for fracture or focal lesion. Sinuses/Orbits: Orbits are normal symmetric. Paranasal sinuses unchanged with complete opacification of the left maxillary sinus with opacification of the ethmoid air cells and mucosal membrane thickening over the sphenoid sinus. Other: None. IMPRESSION: 1. No acute findings. 2. Evidence of patient's known subacute  infarct over the left corona radiata as seen on recent MRI. Suggestion of tiny old lacunar infarcts over the right thalamus and left basal ganglia. 3. Chronic sinus inflammatory disease. Electronically Signed   By: Toribio Agreste M.D.   On: 03/23/2024 14:04   DG Hip Unilat W or Wo Pelvis 2-3 Views Right Result Date: 03/23/2024 EXAM: 2 OR MORE VIEW(S) XRAY OF THE RIGHT HIP 03/23/2024 12:37:00 PM COMPARISON: None available. CLINICAL HISTORY: Fall FINDINGS: BONES AND JOINTS: 6.5 x 2.7 cm chondroid matrix lesion in proximal right femoral diaphysis consistent with benign enchondroma. Similar appearance on 05/16/2022. No acute fracture. No malalignment. SOFT TISSUES: The soft tissues are unremarkable. VASCULATURE: Atherosclerosis. IMPRESSION: 1. No acute fracture or dislocation. 2. Stable 6.5 x 2.7 cm benign enchondroma in the proximal right femoral diaphysis. 3. Incidental atherosclerosis. Electronically signed by: Ryan Salvage MD 03/23/2024 01:32 PM EST RP Workstation: [id number]     Procedures   Medications Ordered in the ED  amLODipine  (NORVASC ) tablet 5 mg (has no administration in time range)  aspirin  EC tablet 81 mg (has no administration in time range)  clopidogrel  (PLAVIX ) tablet 75 mg (has no administration in time range)  lisinopril  (ZESTRIL ) tablet 20 mg (has no administration in time range)  rosuvastatin  (CRESTOR ) tablet 20 mg (has no administration in time range)    Clinical Course as of 03/23/24 2109  Wed Mar 23, 2024  1640 Urinalysis, Routine w reflex microscopic -Urine, Clean Catch(!) Urinalysis normal.  CBC shows hemoglobin slightly decreased compared to previous.  Metabolic panel normal. [JK]  1641 Head CT without acute finding.  Hip x-ray without acute finding [JK]  1708 Case discussed with Dr Michaela.  Agrees with MRI plan [JK]  1957 MRI shows expansion and worsening of previously basal ganglia stroke.  Now measuring up to 2.9 cm in size.  No evidence of hemorrhage.  No  acute abnormality [JK]  2008 Case discussed with Dr Kaliqdinah.  Patient does not require any additional stroke workup.  He would benefit from PT OT.   [JK]  2033 Case discussed with Dr Tobie.  Does not feel admission is necessary. [JK]    Clinical Course User Index [JK] Randol Simmonds, MD                                 Medical Decision Making Amount and/or Complexity of Data Reviewed Labs:  Decision-making details documented in ED Course. Radiology: ordered.  Risk OTC drugs. Prescription drug management.  Patient presented to the ED for increasing weakness after recently admitted  to the hospital for stroke.  Concerned about the possibility of recurrent stroke versus evolution of his recent stroke.  No significant lab abnormalities noted.  CT scan did not show any acute abnormality.  X-ray of the hip was also performed as the patient did follow-up with that is negative.  MRI was performed and it does not show acute stroke.  It shows evolution of his recent stroke.  I discussed the case with neurology, no further workup is necessary.  I also discussed case with hospitalist.  No indication for readmission at this time.  Patient will hold in the ED to attempt to find placement as patient is not safe for discharge at this time as his condition has worsened since his recent hospitalization.  He will likely need rehab    Final diagnoses:  Cerebrovascular accident (CVA), unspecified mechanism [facility])    ED Discharge Orders     None          Randol Simmonds, MD 03/23/24 2109

## 2024-03-23 NOTE — [place] FL2 (Signed)
 [place]  MEDICAID FL2 LEVEL OF CARE FORM     IDENTIFICATION  Patient Name: Isaac Meza Birthdate: 1970/11/07 Sex: male Admission Date (Current Location): 03/23/2024  [facility] and [place] Number:  Producer, Television/film/video and Address:  [facility] [place], [address] [place], [place] [address]      Provider Number: [id number]  Attending Physician Name and Address:  Randol Simmonds, MD  Relative Name and Phone Number:  Kaydan Wong (spouse) [phone]    Current Level of Care: Hospital Recommended Level of Care: Skilled Nursing Facility Prior Approval Number:    Date Approved/Denied:   PASRR Number: [id number]  Discharge Plan: SNF    Current Diagnoses: Patient Active Problem List   Diagnosis Date Noted   CVA (cerebral vascular accident) (HCC) 03/17/2024   Stroke (HCC) 03/17/2024   Epigastric abdominal pain 12/06/2022   Hypertensive urgency 12/06/2022   Alcohol use disorder 12/06/2022   Erosive gastropathy 12/06/2022   Erythrocytosis 12/06/2022   Protein-calorie malnutrition, severe 08/01/2022   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   Abnormal CT of the abdomen 07/30/2022   Pancreatic cyst 07/30/2022   Nausea and vomiting 07/30/2022   Chronic pancreatitis (HCC) 07/29/2022   Dehydration 07/29/2022   Duodenal obstruction 07/27/2022   Acute appendicitis 05/16/2022   Essential hypertension 04/30/2022   Long term current use of opiate analgesic 09/12/2020   Bilateral thoracic back pain 10/13/2017    Orientation RESPIRATION BLADDER Height & Weight     Self, Time, Situation, Place  Normal Continent Weight:   Height:     BEHAVIORAL SYMPTOMS/MOOD NEUROLOGICAL BOWEL NUTRITION STATUS      Continent Diet (See D/C Summary/AVS)  AMBULATORY STATUS COMMUNICATION OF NEEDS Skin   Limited Assist Verbally Normal                       Personal Care Assistance Level of Assistance  Bathing, Feeding, Dressing Bathing Assistance: Limited assistance Feeding  assistance: Independent Dressing Assistance: Limited assistance     Functional Limitations Info  Sight, Hearing, Speech Sight Info: Adequate Hearing Info: Adequate Speech Info: Impaired    SPECIAL CARE FACTORS FREQUENCY  PT (By licensed PT), OT (By licensed OT)     PT Frequency: 5x/week OT Frequency: 5x/week            Contractures Contractures Info: Not present    Additional Factors Info  Code Status, Allergies Code Status Info: Full (as of 03/17/24) Allergies Info: No Known Allergies           Current Medications (03/23/2024):  This is the current hospital active medication list Current Facility-Administered Medications  Medication Dose Route Frequency Provider Last Rate Last Admin   amLODipine  (NORVASC ) tablet 5 mg  5 mg Oral Daily Randol Simmonds, MD   5 mg at 03/23/24 2153   aspirin  EC tablet 81 mg  81 mg Oral Daily Randol Simmonds, MD   81 mg at 03/23/24 2154   clopidogrel  (PLAVIX ) tablet 75 mg  75 mg Oral Daily Randol Simmonds, MD   75 mg at 03/23/24 2153   lisinopril  (ZESTRIL ) tablet 20 mg  20 mg Oral Daily Randol Simmonds, MD   20 mg at 03/23/24 2153   rosuvastatin  (CRESTOR ) tablet 20 mg  20 mg Oral Daily Randol Simmonds, MD   20 mg at 03/23/24 2153   Current Outpatient Medications  Medication Sig Dispense Refill   amLODipine  (NORVASC ) 5 MG tablet Take 1 tablet (5 mg total) by mouth daily. 30 tablet 0  aspirin  EC 81 MG tablet Take 1 tablet (81 mg total) by mouth daily. Swallow whole. 30 tablet 0   clopidogrel  (PLAVIX ) 75 MG tablet Take 1 tablet (75 mg total) by mouth daily for 21 days. 21 tablet 0   naproxen sodium (ALEVE) 220 MG tablet Take 440 mg by mouth daily as needed (pain).     rosuvastatin  (CRESTOR ) 20 MG tablet Take 1 tablet (20 mg total) by mouth daily. 90 tablet 0   lisinopril  (ZESTRIL ) 20 MG tablet Take 1 tablet (20 mg total) by mouth daily. (Patient not taking: Reported on 03/23/2024) 90 tablet 1     Discharge Medications: Please see discharge summary for a list of  discharge medications.  Relevant Imaging Results:  Relevant Lab Results:   Additional Information SSN# [id number]  Bernardino Dean, [address]

## 2024-03-23 NOTE — ED Notes (Signed)
 Patient transported to MRI

## 2024-03-23 NOTE — ED Notes (Signed)
 PT alert and oriented with no complaints. EDP at bedside speaking to pt about what's next. PT wife does not feel safe taking pt home due to his weakness and inability to walk on his own and thinks pt needs rehab to help him. PT is fine with holding in ED or being admitted to see social work, and getting PT/OT evaluation.

## 2024-03-23 NOTE — ED Provider Triage Note (Signed)
 Emergency Medicine Provider Triage Evaluation Note  Zach Tietje , a 53 y.o. male  was evaluated in triage.  Pt complains of worsening neurodeficits secondary to stroke this past Thursday.  Patient reports at discharge he was able to ambulate swelling with deficits on right leg and arm.  Patient reports his walking has been getting worse and he had a fall today where he hurt his right hip.  He is still able to eat solids and handle secretions but has difficulty drinking water at times.  Patient also reports his walking is getting worse and he feels more weak at times in his right arm.  He advises he is able to move his arm today but yesterday it was difficult.  Review of Systems  Positive:          Waxing and waning residual neurodeficits, fall Negative: Chest pain, shortness of breath, dizziness, syncope  Physical Exam  BP (!) 188/109 (BP Location: Left Arm)   Pulse (!) 101   Temp 97.6 F (36.4 C)   Resp 17   SpO2 100%  Gen:   Awake, no distress   Resp:  Normal effort lungs are clear auscultation in all fields MSK:   Patient has deficits on the right arm and right leg from stroke this past weekend Other:  No hemineglect, pupils equal reactive bilaterally, patient able to handle secretions, approximately 3 out of 10 strength in the right arm 1 out of 10 strength in the right leg.  Medical Decision Making  Medically screening exam initiated at 11:49 AM.  Appropriate orders placed.  Arwin Bisceglia was informed that the remainder of the evaluation will be completed by another provider, this initial triage assessment does not replace that evaluation, and the importance of remaining in the ED until their evaluation is complete.  Patient is not on blood thinners but dual antiplatelet, will repeat CT head, x-ray hip, get basic lab works.   Myriam Fonda RAMAN, PA-C 03/23/24 1155

## 2024-03-23 NOTE — Progress Notes (Signed)
 Isaac Meza is a 53 yo m who presents to the ED with worsened right sided deficits and speech difficulties in the context of a recent hospitalization for a stroke last Thursday. TOC consult for potential rehab/placement needs received. Per attending, patient currently without indication for admission and will be boarding in the ED.  CSW met with patient at bedside. Patient affirms ambulation and functional deficits stemming from R sided weakness. Patient affirms need for rehab/placement. Reviewed placement process in ED setting. Provided education on PT/OT role in driving disposition planning. Patient aware and expectant of pending PT/OT consult. Reviewed potential barriers to placement, including patients medicaid insurance. Patient understanding, at this time agreeable to whichever LOC is recommended by PT/OT. Without further questions. Requests call to spouse, Dawn. Called 2x, no answer.   FL2 and PASRR completed in anticipation of SNF recommendation. TOC following, await PT/OT recs.

## 2024-03-23 NOTE — ED Triage Notes (Signed)
 Patient had a stroke on Thursday and is now having more difficulty swallowing and with speech. Right sided deficits have also gotten worse.

## 2024-03-23 NOTE — ED Triage Notes (Signed)
 Pt had stroke last Thursday, was discharged home on Sunday and was able to ambulate independently. Monday mid day patient states right side became weak and has gotten progressively worse since. Pt is now unable to walk and weak on entire right side. Pt has had multiple falls at home since Monday. Pt also woke up this morning and had new onset difficulty swallowing. Pt able to talk and in no apparent respiratory distress at this time.

## 2024-03-24 ENCOUNTER — Emergency Department (HOSPITAL_COMMUNITY)

## 2024-03-24 ENCOUNTER — Inpatient Hospital Stay: Admitting: Internal Medicine

## 2024-03-24 DIAGNOSIS — R29708 NIHSS score 8: Secondary | ICD-10-CM | POA: Diagnosis not present

## 2024-03-24 DIAGNOSIS — M79604 Pain in right leg: Secondary | ICD-10-CM | POA: Diagnosis not present

## 2024-03-24 DIAGNOSIS — I10 Essential (primary) hypertension: Secondary | ICD-10-CM | POA: Diagnosis not present

## 2024-03-24 DIAGNOSIS — W19XXXA Unspecified fall, initial encounter: Secondary | ICD-10-CM | POA: Diagnosis present

## 2024-03-24 DIAGNOSIS — R29712 NIHSS score 12: Secondary | ICD-10-CM | POA: Diagnosis not present

## 2024-03-24 DIAGNOSIS — M25551 Pain in right hip: Secondary | ICD-10-CM | POA: Diagnosis not present

## 2024-03-24 DIAGNOSIS — Z7902 Long term (current) use of antithrombotics/antiplatelets: Secondary | ICD-10-CM | POA: Diagnosis not present

## 2024-03-24 DIAGNOSIS — I69391 Dysphagia following cerebral infarction: Secondary | ICD-10-CM | POA: Diagnosis not present

## 2024-03-24 DIAGNOSIS — F17210 Nicotine dependence, cigarettes, uncomplicated: Secondary | ICD-10-CM | POA: Diagnosis not present

## 2024-03-24 DIAGNOSIS — I69351 Hemiplegia and hemiparesis following cerebral infarction affecting right dominant side: Secondary | ICD-10-CM | POA: Diagnosis not present

## 2024-03-24 DIAGNOSIS — J45909 Unspecified asthma, uncomplicated: Secondary | ICD-10-CM | POA: Diagnosis not present

## 2024-03-24 DIAGNOSIS — I739 Peripheral vascular disease, unspecified: Secondary | ICD-10-CM | POA: Diagnosis not present

## 2024-03-24 DIAGNOSIS — I6381 Other cerebral infarction due to occlusion or stenosis of small artery: Secondary | ICD-10-CM | POA: Diagnosis not present

## 2024-03-24 DIAGNOSIS — Z8673 Personal history of transient ischemic attack (TIA), and cerebral infarction without residual deficits: Secondary | ICD-10-CM | POA: Diagnosis not present

## 2024-03-24 DIAGNOSIS — R4701 Aphasia: Secondary | ICD-10-CM | POA: Diagnosis not present

## 2024-03-24 DIAGNOSIS — D649 Anemia, unspecified: Secondary | ICD-10-CM | POA: Diagnosis not present

## 2024-03-24 DIAGNOSIS — R531 Weakness: Secondary | ICD-10-CM | POA: Diagnosis not present

## 2024-03-24 DIAGNOSIS — I639 Cerebral infarction, unspecified: Secondary | ICD-10-CM | POA: Diagnosis not present

## 2024-03-24 DIAGNOSIS — R1310 Dysphagia, unspecified: Secondary | ICD-10-CM | POA: Diagnosis not present

## 2024-03-24 DIAGNOSIS — D72829 Elevated white blood cell count, unspecified: Secondary | ICD-10-CM | POA: Diagnosis not present

## 2024-03-24 DIAGNOSIS — E785 Hyperlipidemia, unspecified: Secondary | ICD-10-CM | POA: Diagnosis not present

## 2024-03-24 DIAGNOSIS — Z79899 Other long term (current) drug therapy: Secondary | ICD-10-CM | POA: Diagnosis not present

## 2024-03-24 DIAGNOSIS — R471 Dysarthria and anarthria: Secondary | ICD-10-CM | POA: Diagnosis not present

## 2024-03-24 DIAGNOSIS — I6782 Cerebral ischemia: Secondary | ICD-10-CM | POA: Diagnosis not present

## 2024-03-24 DIAGNOSIS — G8191 Hemiplegia, unspecified affecting right dominant side: Secondary | ICD-10-CM | POA: Diagnosis not present

## 2024-03-24 DIAGNOSIS — F1010 Alcohol abuse, uncomplicated: Secondary | ICD-10-CM | POA: Diagnosis present

## 2024-03-24 DIAGNOSIS — G510 Bell's palsy: Secondary | ICD-10-CM | POA: Diagnosis not present

## 2024-03-24 DIAGNOSIS — Z7982 Long term (current) use of aspirin: Secondary | ICD-10-CM | POA: Diagnosis not present

## 2024-03-24 MED ORDER — FENTANYL CITRATE (PF) 50 MCG/ML IJ SOSY
25.0000 ug | PREFILLED_SYRINGE | INTRAMUSCULAR | Status: DC | PRN
  Administered 2024-03-24 – 2024-03-25 (×2): 25 ug via INTRAVENOUS
  Filled 2024-03-24 (×2): qty 1

## 2024-03-24 MED ORDER — NALOXONE HCL 0.4 MG/ML IJ SOLN: 0.4000 mg | INTRAMUSCULAR | Status: DC | PRN

## 2024-03-24 NOTE — ED Notes (Signed)
 Patient is resting comfortably.

## 2024-03-24 NOTE — H&P (Addendum)
 History and Physical    Patient: Isaac Meza FMW:[id number] DOB: March 16, 1971 DOA: 03/23/2024 DOS: the patient was seen and examined on 03/24/2024 PCP: [name], DO  Patient coming from: Home  Chief Complaint:  Chief Complaint  Patient presents with   Fall   Extremity Weakness   HPI: Isaac Meza is a 53 y.o. male with medical history significant of asthma, HTN, alcohol use disorder, and recent admission from 12/11-13 for acute CVA (MRI brain showed patchy acute small vessel type infarcts in the left corona radiata, tracking toward the posterior left lentiform; pt started on ASA, Plavix  and Crestor  and d/c home with PT/OT who p/w worsening stroke-like sx including worsening dysartyhria and L hemiplegia.   The patient initially had a stroke on Thursday 12/11 and was admitted to [facility] for treatment. Upon discharge on Sunday 12/14, the patient exhibited improvement, demonstrated by improved ambulation and speech clarity. However, since returning home, the patient's condition progressively worsened. The left side of the patient's body hardly functioned, and speech deteriorated; as such, pt returned to the ED for further evaluation.  In the ED, pt AFVSS. Labs unremarkable. EDP consulted Neurology, PT/OT to eval for IP rehab, and requested medicine admission.   Review of Systems: As mentioned in the history of present illness. All other systems reviewed and are negative. Past Medical History:  Diagnosis Date   Asthma    childhood   Hypertension    Past Surgical History:  Procedure Laterality Date   APPENDECTOMY  2024   BACK SURGERY     ESOPHAGOGASTRODUODENOSCOPY (EGD) WITH PROPOFOL  N/A 08/01/2022   Procedure: ESOPHAGOGASTRODUODENOSCOPY (EGD) WITH PROPOFOL ;  Surgeon: Rollin Dover, MD;  Location: [facility] ENDOSCOPY;  Service: Gastroenterology;  Laterality: N/A;   EUS N/A 08/01/2022   Procedure: UPPER ENDOSCOPIC ULTRASOUND (EUS) LINEAR;  Surgeon: Rollin Dover, MD;  Location:  [facility] ENDOSCOPY;  Service: Gastroenterology;  Laterality: N/A;   FINE NEEDLE ASPIRATION  08/01/2022   Procedure: FINE NEEDLE ASPIRATION (FNA) LINEAR;  Surgeon: Rollin Dover, MD;  Location: [facility] ENDOSCOPY;  Service: Gastroenterology;;   FRACTURE SURGERY Right 2007   arm   XI ROBOTIC LAPAROSCOPIC ASSISTED APPENDECTOMY N/A 05/16/2022   Procedure: XI ROBOTIC LAPAROSCOPIC ASSISTED APPENDECTOMY;  Surgeon: Desiderio Schanz, MD;  Location: [facility] ORS;  Service: General;  Laterality: N/A;   Social History:  reports that he has been smoking cigarettes. He started smoking about 40 years ago. He has a 20.3 pack-year smoking history. He has never used smokeless tobacco. He reports current alcohol use of about 21.0 standard drinks of alcohol per week. He reports that he does not currently use drugs.  Allergies[1]  History reviewed. No pertinent family history.  Prior to Admission medications  Medication Sig Start Date End Date Taking? Authorizing Provider  amLODipine  (NORVASC ) 5 MG tablet Take 1 tablet (5 mg total) by mouth daily. 03/19/24 03/19/25 Yes Laurita Pillion, MD  aspirin  EC 81 MG tablet Take 1 tablet (81 mg total) by mouth daily. Swallow whole. 03/20/24  Yes Laurita Pillion, MD  clopidogrel  (PLAVIX ) 75 MG tablet Take 1 tablet (75 mg total) by mouth daily for 21 days. 03/20/24 04/10/24 Yes Laurita Pillion, MD  naproxen sodium (ALEVE) 220 MG tablet Take 440 mg by mouth daily as needed (pain).   Yes [provider]  rosuvastatin  (CRESTOR ) 20 MG tablet Take 1 tablet (20 mg total) by mouth daily. 03/19/24  Yes Laurita Pillion, MD  lisinopril  (ZESTRIL ) 20 MG tablet Take 1 tablet (20 mg total) by mouth daily. Patient not taking: Reported  on 03/23/2024 11/24/23   [name], DO    Physical Exam: Vitals:   03/24/24 0800 03/24/24 0845 03/24/24 1000 03/24/24 1115  BP: 125/87 101/81 105/78 105/80  Pulse: 92 86 70 76  Resp:      Temp:      TempSrc:      SpO2: 100% 100% 100% 100%   General: Alert, oriented  x3, resting comfortably in no acute distress HEENT: EOMI, oropharynx clear, moist mucous membranes, hearing intact Neck: Trachea midline and no gross thyromegaly Respiratory: Lungs clear to auscultation bilaterally with normal respiratory effort; no w/r/r Cardiovascular: Regular rate and rhythm w/o m/r/g Abdomen: Soft, nontender, nondistended. Positive bowel sounds MSK: No obvious joint deformities or swelling Skin: No obvious rashes or lesions Neurologic: Awake, alert, spontaneously moves all extremities, strength intact Psychiatric: Appropriate mood and affect, conversational and cooperative   Data Reviewed:  Lab Results  Component Value Date   WBC 11.8 (H) 03/23/2024   HGB 12.1 (L) 03/23/2024   HCT 35.0 (L) 03/23/2024   MCV 94.1 03/23/2024   PLT 303 03/23/2024   Lab Results  Component Value Date   GLUCOSE 125 (H) 03/23/2024   CALCIUM  9.1 03/23/2024   NA 136 03/23/2024   K 4.1 03/23/2024   CO2 25 03/23/2024   CL 100 03/23/2024   BUN 11 03/23/2024   CREATININE 0.63 03/23/2024   Lab Results  Component Value Date   ALT 9 03/17/2024   AST 22 03/17/2024   ALKPHOS 83 03/17/2024   BILITOT 0.2 03/17/2024   Lab Results  Component Value Date   INR 0.9 03/17/2024   Radiology: CT Head Wo Contrast Result Date: 03/24/2024 EXAM: CT HEAD WITHOUT CONTRAST 03/24/2024 09:09:00 AM TECHNIQUE: CT of the head was performed without the administration of intravenous contrast. Automated exposure control, iterative reconstruction, and/or weight based adjustment of the mA/kV was utilized to reduce the radiation dose to as low as reasonably achievable. 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 FINDINGS: BRAIN AND VENTRICLES: No acute hemorrhage. Evolving infarct in left corona radiata. Acute infarct in right thalamus better seen on prior brain MRI. No hydrocephalus. No extra-axial  collection. No mass effect or midline shift. ORBITS: No acute abnormality. SINUSES: Complete opacification of left maxillary sinus. SOFT TISSUES AND SKULL: No acute soft tissue abnormality. No skull fracture. IMPRESSION: 1. Evolving infarct in left corona radiata. 2. Acute infarct in right thalamus, better seen on prior brain MRI. 3. Complete opacification of left maxillary sinus. Electronically signed by: Evalene Coho MD 03/24/2024 10:02 AM EST RP Workstation: [id number]   MR BRAIN WO CONTRAST Result Date: 03/23/2024 CLINICAL DATA:  Initial evaluation for acute increase in weakness, recent stroke. EXAM: MRI HEAD WITHOUT CONTRAST TECHNIQUE: Multiplanar, multiecho pulse sequences of the brain and surrounding structures were obtained without intravenous contrast. COMPARISON:  CT from earlier the same day as well as recent MRI from 03/17/2024. FINDINGS: Brain: Examination mildly degraded by motion artifact. Cerebral volume within normal limits. Patchy T2/FLAIR hyperintensity involving the supratentorial cerebral white matter, most characteristic of chronic microvascular ischemic disease, mild in nature. Remote lacunar infarcts present about the bilateral basal ganglia and thalami. There has been interval expansion and worsening in previously identified posterior left basal ganglia infarct, now measuring up to 2.9 cm in size (series 2, image 32). No associated hemorrhage or significant regional mass effect. No other areas of new or interval ischemia elsewhere. Gray-white matter differentiation otherwise maintained. No other  acute or chronic intracranial blood products. No mass lesion, midline shift or mass effect. No hydrocephalus or extra-axial fluid collection. Pituitary gland within normal limits. Vascular: Major intracranial vascular flow voids are maintained. Skull and upper cervical spine: Cranial junction within normal limits. Exaggeration of the normal cervical lordosis noted. No scalp soft tissue  abnormality. Sinuses/Orbits: Globes orbital soft tissues within normal limits. Chronic left ethmoidal and left maxillary sinusitis, left OM U obstructive pattern. Trace right mastoid effusion, of doubtful significance. Other: None. IMPRESSION: 1. Interval expansion and worsening in recently identified posterior left basal ganglia infarct, now measuring up to 2.9 cm in size. No associated hemorrhage or significant regional mass effect. 2. No other new intracranial abnormality. 3. Underlying mild chronic microvascular ischemic disease with remote lacunar infarcts about the bilateral basal ganglia and thalami. 4. Chronic left ethmoidal and left maxillary sinusitis, left OMU obstructive pattern. Electronically Signed   By: Morene Hoard M.D.   On: 03/23/2024 19:15   CT Head Wo Contrast Result Date: 03/23/2024 CLINICAL DATA:  Known stroke follow-up. EXAM: CT HEAD WITHOUT CONTRAST TECHNIQUE: Contiguous axial images were obtained from the base of the skull through the vertex without intravenous contrast. RADIATION DOSE REDUCTION: This exam was performed according to the departmental dose-optimization program which includes automated exposure control, adjustment of the mA and/or kV according to patient size and/or use of iterative reconstruction technique. COMPARISON:  Head CT 03/17/2024 and MRI brain 03/17/2024. FINDINGS: Brain: Ventricles, cisterns and other CSF spaces are normal. Evidence of patient's known subacute infarct over the left corona radiata as seen on recent MRI. Suggestion of tiny lacunar infarct over the right thalamus and left basal ganglia. No significant mass effect or midline shift. No acute hemorrhage. Remainder the exam is unchanged. Vascular: No hyperdense vessel or unexpected calcification. Skull: Normal. Negative for fracture or focal lesion. Sinuses/Orbits: Orbits are normal symmetric. Paranasal sinuses unchanged with complete opacification of the left maxillary sinus with opacification  of the ethmoid air cells and mucosal membrane thickening over the sphenoid sinus. Other: None. IMPRESSION: 1. No acute findings. 2. Evidence of patient's known subacute infarct over the left corona radiata as seen on recent MRI. Suggestion of tiny old lacunar infarcts over the right thalamus and left basal ganglia. 3. Chronic sinus inflammatory disease. Electronically Signed   By: Toribio Agreste M.D.   On: 03/23/2024 14:04   DG Hip Unilat W or Wo Pelvis 2-3 Views Right Result Date: 03/23/2024 EXAM: 2 OR MORE VIEW(S) XRAY OF THE RIGHT HIP 03/23/2024 12:37:00 PM COMPARISON: None available. CLINICAL HISTORY: Fall FINDINGS: BONES AND JOINTS: 6.5 x 2.7 cm chondroid matrix lesion in proximal right femoral diaphysis consistent with benign enchondroma. Similar appearance on 05/16/2022. No acute fracture. No malalignment. SOFT TISSUES: The soft tissues are unremarkable. VASCULATURE: Atherosclerosis. IMPRESSION: 1. No acute fracture or dislocation. 2. Stable 6.5 x 2.7 cm benign enchondroma in the proximal right femoral diaphysis. 3. Incidental atherosclerosis. Electronically signed by: Ryan Salvage MD 03/23/2024 01:32 PM EST RP Workstation: [id number]    Assessment and Plan: 78M h/o asthma, HTN, alcohol use disorder, and recent admission from 12/11-13 for acute CVA (MRI brain showed patchy acute small vessel type infarcts in the left corona radiata, tracking toward the posterior left lentiform; pt started on ASA, Plavix  and Crestor  and d/c home with PT/OT who p/w worsening stroke-like sx including worsening dysartyhria and L hemiplegia.   Acute CVA, not improving as expected L hemiplegia Dysarthria -Neuro following; appreciate eval/recs -PT/OT/SLP following; appreciate recs -PTA ASA 81mg  daily,  Plavix  75mg  daily x21 days, and Crestor  20mg  daily  HTN -PTA amlodipine  5mg  daily and lisinopril  20mg  daily  Alcohol use disorder -Defer CIWA for now given low clinical suspicion   Advance Care Planning:    Code Status: Full Code   Consults: Neurology  Family Communication: Wife  Severity of Illness: The appropriate patient status for this patient is INPATIENT. Inpatient status is judged to be reasonable and necessary in order to provide the required intensity of service to ensure the patient's safety. The patient's presenting symptoms, physical exam findings, and initial radiographic and laboratory data in the context of their chronic comorbidities is felt to place them at high risk for further clinical deterioration. Furthermore, it is not anticipated that the patient will be medically stable for discharge from the hospital within 2 midnights of admission.   * I certify that at the point of admission it is my clinical judgment that the patient will require inpatient hospital care spanning beyond 2 midnights from the point of admission due to high intensity of service, high risk for further deterioration and high frequency of surveillance required.*   ------- I spent 57 minutes reviewing previous notes, at the bedside counseling/discussing the treatment plan, and performing clinical documentation.  Author: Marsha Ada, MD 03/24/2024 12:28 PM  For on call review [email or web address].      [1] No Known Allergies

## 2024-03-24 NOTE — ED Provider Notes (Signed)
 8:45 AM I was called to the patient's room as after the patient's wife arrived to see him this morning she noticed substantial changes in his deficits.  Patient with known stroke, 1 week ago, evaluated yesterday, MRI with progression of lesions.  Wife notes that the patient has complete right arm loss of function whereas yesterday he was able to grip, hold it against gravity a bit.  In addition patient with worsening right facial asymmetry, aphasia.  Neurology consulted, CT to evaluate for hemorrhagic conversion ordered.  Update no conversion, I discussed this case with our neurology colleagues, and our hospitalist team.  Patient admitted for further monitoring, management.   Garrick Charleston, MD 03/24/24 [phone]

## 2024-03-24 NOTE — Progress Notes (Signed)
° ° °  EXPEDITER LEVEL LOADING ASSESSMENT NOTE  Patient Name: Isaac Meza  DOB:[birth year] Date of Admission: 03/23/2024  Date of Assessment:03/24/2024   -------------------------------------------------------------------------------------------------------------------   Brief clinical summary: 53 y.o. male presented to the ED with strokelike symptoms.   Is there Bed Availability at another [facility]? Yes  If yes, what facility: [facility]  Level of Care Needed:  Yes  MD Agree to transfer: N/A  Patient agrees to transfer: No    -------------------------------------------------------------------------------------------------------------------  [facility] RN Expediter, Irvin Bastin S Reegan Mctighe Please contact us  directly via secure chat (search for [facility]) or by calling us  at [phone] [facility]).

## 2024-03-24 NOTE — ED Notes (Signed)
 Pt/pt's wife asked if pt would like to transfer to [facility] for admission. Pt wishes to stay here for treatment.

## 2024-03-24 NOTE — Consult Note (Signed)
 NEUROLOGY CONSULT NOTE   Date of service: March 24, 2024 Patient Name: Isaac Meza MRN:  [id number] DOB:  [birth year] Chief Complaint: worsening right sided weakness Requesting Provider: Georgina Basket, MD  History of Present Illness  Isaac Meza is a 53 y.o. male with hx of asthma, hypertension, and heavy alcohol use who presented to [facility] 12/11 initially with acute onset of right sided weakness and slurred speech with reported ataxic gait for about 2 days PTA.  He was not a candidate for thrombolytic therapy due to being outside of the treatment window.  MRI brain showed small acute infarcts in the left corona radiata concerning for small vessel disease versus embolic stroke and patient underwent stroke workup and was discharged on dual antiplatelet therapy with aspirin  and Plavix  for 3 weeks.  Patient was discharged on "Sunday where he was walking with a walker mainly using his left leg but was able to hold onto the walker with his right hand.  Monday morning was the same but starting Monday afternoon he had a progressive decline in his right upper and lower extremity strength and significantly worsened slurred speech.  Patient states he woke up this morning and was unable to use his right side at all prompting him to come back in for further evaluation.  Patient's wife notes that he was unable to walk with a walker or grip the walker handle any longer this morning.  CT head confirmed evolving/extension of infarct in the left corona radiata.  NIHSS components Score: Comment  1a Level of Conscious 0[x] 1[] 2[] 3[]     1b LOC Questions 0[x] 1[] 2[]      1c LOC Commands 0[x] 1[] 2[]      2 Best Gaze 0[x] 1[] 2[]      3 Visual 0[x] 1[] 2[] 3[]     4 Facial Palsy 0[] 1[] 2[x] 3[]     5a Motor Arm - left 0[x] 1[] 2[] 3[] 4[] UN[]   5b Motor Arm - Right 0[] 1[] 2[] 3[] 4[x] UN[]   6a Motor Leg - Left 0[x] 1[] 2[] 3[] 4[] UN[]   6b Motor Leg - Right 0[] 1[] 2[] 3[] 4[x] UN[]   7 Limb Ataxia  0[x] 1[] 2[] UN[]     8 Sensory 0[x] 1[] 2[] UN[]     9 Best Language 0[x] 1[] 2[] 3[]     10 Dysarthria 0[] 1[] 2[x] UN[]     11"  Extinct. and Inattention 0[x]  1[]  2[]       TOTAL: 12    ROS  Comprehensive ROS performed and pertinent positives documented in HPI   Past History   Past Medical History:  Diagnosis Date   Asthma    childhood   Hypertension    Past Surgical History:  Procedure Laterality Date   APPENDECTOMY  2024   BACK SURGERY     ESOPHAGOGASTRODUODENOSCOPY (EGD) WITH PROPOFOL  N/A 08/01/2022   Procedure: ESOPHAGOGASTRODUODENOSCOPY (EGD) WITH PROPOFOL ;  Surgeon: Rollin Dover, MD;  Location: [facility] ENDOSCOPY;  Service: Gastroenterology;  Laterality: N/A;   EUS N/A 08/01/2022   Procedure: UPPER ENDOSCOPIC ULTRASOUND (EUS) LINEAR;  Surgeon: Rollin Dover, MD;  Location: [facility] ENDOSCOPY;  Service: Gastroenterology;  Laterality: N/A;   FINE NEEDLE ASPIRATION  08/01/2022   Procedure: FINE NEEDLE ASPIRATION (FNA) LINEAR;  Surgeon: Rollin Dover, MD;  Location: [facility] ENDOSCOPY;  Service: Gastroenterology;;   FRACTURE SURGERY Right 2007   arm   XI ROBOTIC LAPAROSCOPIC ASSISTED APPENDECTOMY N/A 05/16/2022   Procedure: XI ROBOTIC  LAPAROSCOPIC ASSISTED APPENDECTOMY;  Surgeon: Desiderio Schanz, MD;  Location: [facility] ORS;  Service: General;  Laterality: N/A;   Family History: History reviewed. No pertinent family history.  Social History  reports that he has been smoking cigarettes. He started smoking about 40 years ago. He has a 20.3 pack-year smoking history. He has never used smokeless tobacco. He reports current alcohol use of about 21.0 standard drinks of alcohol per week. He reports that he does not currently use drugs.  Allergies[1]  Medications  Current Medications[2]  Vitals   Vitals:   03/24/24 1345 03/24/24 1403 03/24/24 1433 03/24/24 1500  BP: 98/76 102/67 106/63 (!) 97/57  Pulse: 75 69 70 67  Resp:  17    Temp:      TempSrc:      SpO2: 100% 100% 100% 100%    There is  no height or weight on file to calculate BMI.  Physical Exam   Constitutional: Appears well-developed and well-nourished.  Psych: Affect appropriate to situation. Calm and cooperative with exam Eyes: No scleral injection.  HENT: No OP obstruction.  Head: Normocephalic.  Cardiovascular: Normal rate and regular rhythm.  Respiratory: Effort normal, non-labored breathing on room air GI: Soft.  No distension. There is no tenderness.  Skin: WDI.   Neurologic Examination   Mental Status: Patient is awake, alert, oriented to person, place, month, year, and situation. Patient is able to give some details regarding history of presentation though most details were obtained via wife.  Patient's speech is severely dysarthric and he does often become frustrated in conversation with repeating himself.  He is able to follow commands and name objects without error. Cranial Nerves: II: Visual Fields are full. Pupils are equal, round, and reactive to light.   III,IV, VI: EOMI without ptosis or diploplia.  V: Facial sensation is intact and symmetric to light touch VII: Right facial weakness VIII: Hearing is intact to voice X: Palate elevates symmetrically XI: Shoulder shrug is symmetric. Motor: Tone is normal. Bulk is normal.  Flaccid right upper extremity, trace if any movement of the right lower extremity.  Left upper and lower extremity strength intact 5/5 without vertical drift. Sensory: Sensation is symmetric to light touch in the arms and legs. No extinction to DSS present.  Cerebellar: FNF and HKS are intact on the left, unable to assess on the right Gait: Deferred for patient safety  Labs/Imaging/Neurodiagnostic studies   CBC:  Recent Labs  Lab April 18, 2024 1200  WBC 11.8*  NEUTROABS 8.1*  HGB 12.1*  HCT 35.0*  MCV 94.1  PLT 303   Basic Metabolic Panel:  Lab Results  Component Value Date   NA 136 2024-04-18   K 4.1 2024-04-18   CO2 25 18-Apr-2024   GLUCOSE 125 (H) 04-18-2024    BUN 11 2024/04/18   CREATININE 0.63 04/18/2024   CALCIUM  9.1 Apr 18, 2024   GFRNONAA >60 18-Apr-2024   Lipid Panel:  Lab Results  Component Value Date   LDLCALC 47 03/18/2024   HgbA1c:  Lab Results  Component Value Date   HGBA1C 5.4 11/24/2023   Urine Drug Screen:     Component Value Date/Time   LABOPIA NEGATIVE 03/17/2024 0730   COCAINSCRNUR NEGATIVE 03/17/2024 0730   LABBENZ NEGATIVE 03/17/2024 0730   AMPHETMU NEGATIVE 03/17/2024 0730   THCU NEGATIVE 03/17/2024 0730   LABBARB NEGATIVE 03/17/2024 0730    Alcohol Level     Component Value Date/Time   ETH 155 (H) 03/17/2024 0120   INR  Lab Results  Component  Value Date   INR 0.9 03/17/2024   APTT  Lab Results  Component Value Date   APTT 30 03/17/2024   AED levels: No results found for: PHENYTOIN, ZONISAMIDE, LAMOTRIGINE, LEVETIRACETA  CT Head without contrast(Personally reviewed): 1. Evolving infarct in left corona radiata. 2. Acute infarct in right thalamus, better seen on prior brain MRI. 3. Complete opacification of left maxillary sinus.  CT angio Head and Neck with contrast with cerebral perfusion 12/11 (Personally reviewed): 1. No acute large vessel occlusion. 2. Moderate bilateral paraclinoid ICA stenosis. 3. No evidence of core infarct or penumbra on CT perfusion.  MRI Brain (Personally reviewed): 1. Interval expansion and worsening in recently identified posterior left basal ganglia infarct, now measuring up to 2.9 cm in size. No associated hemorrhage or significant regional mass effect. 2. No other new intracranial abnormality. 3. Underlying mild chronic microvascular ischemic disease with remote lacunar infarcts about the bilateral basal ganglia and thalami. 4. Chronic left ethmoidal and left maxillary sinusitis, left OMU obstructive pattern.  Echocardiogram: LVEF 60-65%  ASSESSMENT   Isaac Meza is a 53 y.o. male with PMHx of asthma, hypertension, heavy alcohol use and recent CVA seen  at [facility] 12/11 and discharged 12/13 to home with right-sided weakness and slurred speech.  Patient with progressive worsening of stroke deficits to the point of severe dysarthria and flaccid right hemiparesis prompting further evaluation.  Imaging confirms expansion and worsening of recently identified posterior left basal ganglia infarct.  Patient was admitted for further evaluation.  RECOMMENDATIONS  - Permissive hypertension  - Continue DAPT  - PT/OT/Speech - Stroke team to follow - Frequent neuro checks ______________________________________________________________________  Signed, Mimi LELON Ny, NP [facility]   I have seen the patient and reviewed the above note.  The stroke that is seen on MRI appears to be essentially in the same distribution as the initial patchy infarcts, but has now consolidated.  Unfortunately, I am not certain there is any acute intervention that would benefit him at this point, but I would favor permissive hypertension for a longer period.  He is likely to need extensive rehab.  Aisha Seals, MD [facility]   If 7pm- 7am, please page neurology on call as listed in AMION.     [1] No Known Allergies [2]  Current Facility-Administered Medications:    amLODipine  (NORVASC ) tablet 5 mg, 5 mg, Oral, Daily, Randol Simmonds, MD, 5 mg at 03/24/24 1004   aspirin  EC tablet 81 mg, 81 mg, Oral, Daily, Randol Simmonds, MD, 81 mg at 03/24/24 1004   clopidogrel  (PLAVIX ) tablet 75 mg, 75 mg, Oral, Daily, Randol Simmonds, MD, 75 mg at 03/24/24 1004   lisinopril  (ZESTRIL ) tablet 20 mg, 20 mg, Oral, Daily, Randol Simmonds, MD, 20 mg at 03/24/24 1004   rosuvastatin  (CRESTOR ) tablet 20 mg, 20 mg, Oral, Daily, Randol Simmonds, MD, 20 mg at 03/24/24 1004

## 2024-03-24 NOTE — Progress Notes (Signed)
 PT recommends CIR for pt; acceptance pending. CSW to send SNF referral as a backup plan should CIR not be an option.

## 2024-03-24 NOTE — Plan of Care (Signed)

## 2024-03-24 NOTE — Evaluation (Signed)
 Speech Language Pathology Evaluation Patient Details Name: Isaac Meza MRN: [id number] DOB: [birth year] Today's Date: 03/24/2024 Time: 1050-1120 SLP Time Calculation (min) (ACUTE ONLY): 30 min  Problem List:  Patient Active Problem List   Diagnosis Date Noted   CVA (cerebral vascular accident) (HCC) 03/17/2024   Stroke (HCC) 03/17/2024   Epigastric abdominal pain 12/06/2022   Hypertensive urgency 12/06/2022   Alcohol use disorder 12/06/2022   Erosive gastropathy 12/06/2022   Erythrocytosis 12/06/2022   Protein-calorie malnutrition, severe 08/01/2022   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   Abnormal CT of the abdomen 07/30/2022   Pancreatic cyst 07/30/2022   Nausea and vomiting 07/30/2022   Chronic pancreatitis (HCC) 07/29/2022   Dehydration 07/29/2022   Duodenal obstruction 07/27/2022   Acute appendicitis 05/16/2022   Essential hypertension 04/30/2022   Long term current use of opiate analgesic 09/12/2020   Bilateral thoracic back pain 10/13/2017   Past Medical History:  Past Medical History:  Diagnosis Date   Asthma    childhood   Hypertension    Past Surgical History:  Past Surgical History:  Procedure Laterality Date   APPENDECTOMY  2024   BACK SURGERY     ESOPHAGOGASTRODUODENOSCOPY (EGD) WITH PROPOFOL  N/A 08/01/2022   Procedure: ESOPHAGOGASTRODUODENOSCOPY (EGD) WITH PROPOFOL ;  Surgeon: Rollin Dover, MD;  Location[facility] ENDOSCOPY;  Service: Gastroenterology;  Laterality: N/A;   EUS N/A 08/01/2022   Procedure: UPPER ENDOSCOPIC ULTRASOUND (EUS) LINEAR;  Surgeon: Rollin Dover, MD;  Location: [facility] ENDOSCOPY;  Service: Gastroenterology;  Laterality: N/A;   FINE NEEDLE ASPIRATION  08/01/2022   Procedure: FINE NEEDLE ASPIRATION (FNA) LINEAR;  Surgeon: Rollin Dover, MD;  Location: [facility] ENDOSCOPY;  Service: Gastroenterology;;   FRACTURE SURGERY Right 2007   arm   XI ROBOTIC LAPAROSCOPIC ASSISTED APPENDECTOMY N/A 05/16/2022   Procedure: XI ROBOTIC LAPAROSCOPIC ASSISTED  APPENDECTOMY;  Surgeon: Desiderio Schanz, MD;  Location: [facility] ORS;  Service: General;  Laterality: N/A;   HPI:  53yo male admitted from home 03/23/24 with dysarthria, R weakness. Fell at home - right hip bruised. MRI: Interval expansion and worsening in recently identified posterior left basal ganglia infarct. No new abnormality. Mild chronic microvascular ischemic disease with remote lacunar infarcts about the bilateral basal ganglia and thalami. SLP screened 03/17/24: No eval warranted. PMH: recent adm (12/11-13/25) with CVA, HTN, intermittent asthma   Assessment / Plan / Recommendation Clinical Impression  Pt presents with receptive and expressive language [facility]. Speech is moderately-severely dysarthric. CN exam significant for right orofacial weakness. Sensation is intact. Voice quality is breathy, low in intensity. Wife reports pt is soft-spoken at baseline. Pt was encouraged to increase loudness, decrease rate of speech, and make over-exaggerated movements of lips and tongue to improve intelligibility.   The [employer] Mental Status (SLUMS) Examination was administered today. Pt scored 17/30, indicating neurocognitive deficits. Pt exhibited difficulty with orientation to year (2026), mental math/problem solving, thought organization (9 animals in 60 sec, N=15+), recall (1/5 recalled after delay/distraction), and attention.   Pt was observed drinking thin liquids without overt s/s aspiration. Wife reports intermittent coughing with liquids. Please order BSE if difficulty is noted at bedside.   Pt will benefit from continued skilled ST intervention targeting intelligibility and cognition. RN, MD informed of results and recommendations.    SLP Assessment  SLP Recommendation/Assessment: Patient needs continued Speech Language Pathology Services SLP Visit Diagnosis: Cognitive communication deficit (R41.841);Dysarthria and anarthria (R47.1)     Assistance Recommended at Discharge  Frequent  or constant Supervision/Assistance  Functional Status Assessment Patient has had a recent  decline in their functional status and demonstrates the ability to make significant improvements in function in a reasonable and predictable amount of time.  Frequency and Duration min 1 x/week  2 weeks      SLP Evaluation Cognition  Overall Cognitive Status: Impaired/Different from baseline Arousal/Alertness: Awake/alert Orientation Level: Oriented to person;Oriented to place;Oriented to situation Year: 2026       Comprehension  Auditory Comprehension Overall Auditory Comprehension: Appears within functional limits for tasks assessed    Expression Expression Primary Mode of Expression: Verbal Verbal Expression Overall Verbal Expression: Appears within functional limits for tasks assessed Written Expression Dominant Hand: Right Written Expression: Not tested (Pt able to complete clock drawing task with Left (non-dominant) hand)   Oral / Motor  Oral Motor/Sensory Function Overall Oral Motor/Sensory Function: Other (comment) (Mod-severe) Facial ROM: Reduced right Facial Symmetry: Abnormal symmetry right Facial Strength: Reduced right Facial Sensation: Within Functional Limits Lingual ROM: Reduced right Lingual Symmetry: Abnormal symmetry right Lingual Strength: Reduced Lingual Sensation: Within Functional Limits Velum: Within Functional Limits Mandible: Within Functional Limits Motor Speech Overall Motor Speech: Impaired Respiration: Within functional limits Phonation: Low vocal intensity;Breathy Resonance: Within functional limits Articulation: Impaired Level of Impairment: Word Intelligibility: Intelligibility reduced Word: 75-100% accurate Phrase: 50-74% accurate Sentence: 50-74% accurate Conversation: 25-49% accurate Motor Planning: Within functional limits Motor Speech Errors: Aware Effective Techniques: Slow rate;Increased vocal intensity;Over-articulate;Pause            Zayquan Bogard B. Dory, MSP, CCC-SLP Speech Language Pathologist Office: [phone]  Dory Caprice Daring 03/24/2024, 11:40 AM

## 2024-03-24 NOTE — Evaluation (Signed)
 Physical Therapy Evaluation Patient Details Name: Isaac Meza MRN: [id number] DOB: [birth year] Today's Date: 03/24/2024  History of Present Illness  Isaac Meza is a 53yo man who presented to [facility] on 12/17 wotj R side weakness, falls and difficulty swallowing. MRI + for evolution of recent L corona radiata infarcts. PMH: HTN, heavy ETOH use, asthma.  Clinical Impression  Pt is currently Min to Mod A for bed mobility, 2 person Mod A for sit to stand with a R lateral lean. Pt was able to attempt wgt shifting but unable to clear feet to progress gait. Pt has good family support at home 24/7. Pt was very active and high functioning working two very active jobs prior to hospitalization. Due to pt current functional status, home set up and available assistance at home recommending skilled physical therapy services > 3 hours/day in order to address strength, balance and functional mobility to decrease risk for falls, injury, immobility, skin break down and re-hospitalization.          If plan is discharge home, recommend the following: A little help with walking and/or transfers;Help with stairs or ramp for entrance;Assistance with cooking/housework;Assist for transportation     Equipment Recommendations Wheelchair cushion (measurements PT);Wheelchair (measurements PT);BSC/3in1  Recommendations for Other Services  Rehab consult    Functional Status Assessment Patient has had a recent decline in their functional status and demonstrates the ability to make significant improvements in function in a reasonable and predictable amount of time.     Precautions / Restrictions Precautions Precautions: Fall Recall of Precautions/Restrictions: Impaired Precaution/Restrictions Comments: impulsive Restrictions Weight Bearing Restrictions Per Provider Order: No      Mobility  Bed Mobility Overal bed mobility: Needs Assistance Bed Mobility: Supine to Sit, Sit to Supine     Supine to sit: Min  assist, HOB elevated Sit to supine: Mod assist   General bed mobility comments: assist to raise trunk and gain midline in sitting from supine to sit, guidance for trunk and assist for R LE back onto stretcher    Transfers Overall transfer level: Needs assistance Equipment used: 2 person hand held assist Transfers: Sit to/from Stand Sit to Stand: +2 physical assistance, Mod assist           General transfer comment: assist to rise and steady with R knee blocked for 2 attempts. Wgt shift toward the R Mod to Max A for wgt shift toward the L. Worked on wgt shifting in standing.    Ambulation/Gait     General Gait Details: unable at this time.    Modified Rankin (Stroke Patients Only) Modified Rankin (Stroke Patients Only) Pre-Morbid Rankin Score: No symptoms Modified Rankin: Severe disability     Balance Overall balance assessment: Needs assistance   Sitting balance-Leahy Scale: Good   Postural control: Right lateral lean Standing balance support: Bilateral upper extremity supported Standing balance-Leahy Scale: Poor Standing balance comment: Mod A +2 to remain standing.       Pertinent Vitals/Pain Pain Assessment Pain Assessment: 0-10 Pain Score: 8  Pain Location: R hip area Pain Descriptors / Indicators: Aching Pain Intervention(s): Limited activity within patient's tolerance, Monitored during session, Repositioned    Home Living Family/patient expects to be discharged to:: Private residence Living Arrangements: Spouse/significant other;Other relatives (mother in law and father in law) Available Help at Discharge: Family;Available 24 hours/day Type of Home: House Home Access: Stairs to enter Entrance Stairs-Rails: None Entrance Stairs-Number of Steps: 2   Home Layout: Two level;Able to live on main level with  bedroom/bathroom Home Equipment: None      Prior Function Prior Level of Function : Independent/Modified Independent;Working/employed;Driving              Mobility Comments: works 2 jobs       Chief Of Staff Extremity Assessment: Defer to OT evaluation RUE Deficits / Details: Full PROM, no voluntary movement RUE Sensation: decreased light touch;decreased proprioception RUE Coordination: decreased fine motor;decreased gross motor    Lower Extremity Assessment Lower Extremity Assessment: RLE deficits/detail RLE Deficits / Details: Pt has 2/5 quad contraction in standing, unable to activate in non-WB. Pt with 2/5 HS strength for knee flexion, 0/5 noted in the ankle DF/PF, 1/5 hip abd    Cervical / Trunk Assessment Cervical / Trunk Assessment: Normal  Communication   Communication Communication: Impaired Factors Affecting Communication: Reduced clarity of speech    Cognition Arousal: Alert Behavior During Therapy: Impulsive, Flat affect   PT - Cognitive impairments: Safety/Judgement, Problem solving, Awareness     Following commands: Intact       Cueing Cueing Techniques: Verbal cues, Tactile cues     General Comments General comments (skin integrity, edema, etc.): spouse present during session.        Assessment/Plan    PT Assessment Patient needs continued PT services  PT Problem List Decreased strength;Decreased balance;Decreased activity tolerance;Decreased coordination;Decreased mobility;Pain       PT Treatment Interventions DME instruction;Therapeutic activities;Gait training;Therapeutic exercise;Patient/family education;Stair training;Functional mobility training;Neuromuscular re-education;Balance training    PT Goals (Current goals can be found in the Care Plan section)  Acute Rehab PT Goals Patient Stated Goal: to return to PLOF PT Goal Formulation: With patient/family Time For Goal Achievement: 04/07/24 Potential to Achieve Goals: Fair    Frequency Min 3X/week     Co-evaluation   Reason for Co-Treatment: For patient/therapist safety    OT goals addressed during session: ADL's and self-care       AM-PAC PT 6 Clicks Mobility  Outcome Measure Help needed turning from your back to your side while in a flat bed without using bedrails?: A Lot Help needed moving from lying on your back to sitting on the side of a flat bed without using bedrails?: A Lot Help needed moving to and from a bed to a chair (including a wheelchair)?: Total Help needed standing up from a chair using your arms (e.g., wheelchair or bedside chair)?: Total Help needed to walk in hospital room?: Total Help needed climbing 3-5 steps with a railing? : Total 6 Click Score: 8    End of Session Equipment Utilized During Treatment: Gait belt Activity Tolerance: Patient tolerated treatment well Patient left: in bed;with call bell/phone within reach;with family/visitor present Nurse Communication: Mobility status PT Visit Diagnosis: Unsteadiness on feet (R26.81);Other abnormalities of gait and mobility (R26.89);Hemiplegia and hemiparesis;Pain Hemiplegia - Right/Left: Right Hemiplegia - dominant/non-dominant: Dominant Hemiplegia - caused by: Cerebral infarction Pain - Right/Left: Right Pain - part of body: Hip    Time: 1027-1050 PT Time Calculation (min) (ACUTE ONLY): 23 min   Charges:   PT Evaluation $PT Eval Low Complexity: 1 Low   PT General Charges $$ ACUTE PT VISIT: 1 Visit         Dorothyann Maier, DPT, CLT  Acute Rehabilitation Services Office: [phone] (Secure chat preferred)   Dorothyann VEAR Maier 03/24/2024, 12:17 PM

## 2024-03-24 NOTE — Evaluation (Signed)
 Occupational Therapy Evaluation Patient Details Name: Isaac Meza MRN: [id number] DOB: 1970-09-05 Today's Date: 03/24/2024   History of Present Illness   Isaac Meza is a 53yo man who presented to [facility] on 12/17 wotj R side weakness, falls and difficulty swallowing. MRI + for evolution of recent L corona radiata infarcts. PMH: HTN, heavy ETOH use, asthma.     Clinical Impressions Pt is independent at baseline. Presents with significant R side weakness with impaired sensation, decreased standing balance and impulsivity. He requires min to mod assist for bed mobility and +2 mod assist to stand from stretcher. Pt requires min to total assist for ADLs. Pt has excellent rehab potential with intensive therapies. Patient will benefit from intensive inpatient follow-up therapy, >3 hours/day.     If plan is discharge home, recommend the following:   Two people to help with walking and/or transfers;A lot of help with bathing/dressing/bathroom;Assistance with cooking/housework;Assistance with feeding;Direct supervision/assist for medications management;Direct supervision/assist for financial management;Assist for transportation;Help with stairs or ramp for entrance     Functional Status Assessment   Patient has had a recent decline in their functional status and demonstrates the ability to make significant improvements in function in a reasonable and predictable amount of time.     Equipment Recommendations   Wheelchair (measurements OT);Wheelchair cushion (measurements OT);BSC/3in1     Recommendations for Other Services   Rehab consult     Precautions/Restrictions   Precautions Precautions: Fall Recall of Precautions/Restrictions: Impaired Precaution/Restrictions Comments: impulsive Restrictions Weight Bearing Restrictions Per Provider Order: No     Mobility Bed Mobility Overal bed mobility: Needs Assistance Bed Mobility: Supine to Sit, Sit to Supine     Supine to  sit: Min assist, HOB elevated Sit to supine: Mod assist   General bed mobility comments: assist to raise trunk and gain midline in sitting from supine to sit, guidance for trunk and assist for R LE back onto stretcher    Transfers Overall transfer level: Needs assistance Equipment used: 2 person hand held assist Transfers: Sit to/from Stand Sit to Stand: +2 physical assistance, Mod assist           General transfer comment: assist to rise and steady with R knee blocked      Balance Overall balance assessment: Needs assistance   Sitting balance-Leahy Scale: Good     Standing balance support: Bilateral upper extremity supported Standing balance-Leahy Scale: Poor                             ADL either performed or assessed with clinical judgement   ADL Overall ADL's : Needs assistance/impaired Eating/Feeding: Minimal assistance;Sitting;Bed level   Grooming: Set up;Sitting;Bed level   Upper Body Bathing: Moderate assistance;Sitting   Lower Body Bathing: Sitting/lateral leans;Maximal assistance   Upper Body Dressing : Moderate assistance;Sitting   Lower Body Dressing: Total assistance;Bed level                       Vision Baseline Vision/History: 0 No visual deficits Ability to See in Adequate Light: 0 Adequate Patient Visual Report: No change from baseline       Perception Perception: Not tested       Praxis Praxis: Not tested       Pertinent Vitals/Pain Pain Assessment Pain Assessment: 0-10 Pain Score: 8  Pain Location: R hip area Pain Descriptors / Indicators: Aching Pain Intervention(s): Monitored during session, Repositioned     Extremity/Trunk Assessment Upper  Extremity Assessment Upper Extremity Assessment: Right hand dominant;RUE deficits/detail RUE Deficits / Details: Full PROM, no voluntary movement RUE Sensation: decreased light touch;decreased proprioception RUE Coordination: decreased fine motor;decreased gross  motor   Lower Extremity Assessment Lower Extremity Assessment: Defer to PT evaluation   Cervical / Trunk Assessment Cervical / Trunk Assessment: Normal   Communication Communication Communication: Impaired Factors Affecting Communication: Reduced clarity of speech   Cognition Arousal: Alert Behavior During Therapy: Impulsive, Flat affect Cognition: Difficult to assess Difficult to assess due to: Impaired communication                             Following commands: Intact       Cueing  General Comments   Cueing Techniques: Verbal cues;Tactile cues      Exercises     Shoulder Instructions      Home Living Family/patient expects to be discharged to:: Private residence Living Arrangements: Spouse/significant other;Other relatives (mother in law and father in law) Available Help at Discharge: Family;Available 24 hours/day Type of Home: House Home Access: Stairs to enter [employer] of Steps: 2 Entrance Stairs-Rails: None Home Layout: Two level;Able to live on main level with bedroom/bathroom     Bathroom Shower/Tub: Chief Strategy Officer: Standard     Home Equipment: None          Prior Functioning/Environment Prior Level of Function : Independent/Modified Independent;Working/employed;Driving             Mobility Comments: works 2 jobs      OT Problem List: Decreased strength;Impaired balance (sitting and/or standing);Decreased coordination;Decreased cognition;Decreased safety awareness;Decreased knowledge of precautions;Impaired UE functional use;Pain   OT Treatment/Interventions: Self-care/ADL training;DME and/or AE instruction;Therapeutic activities;Cognitive remediation/compensation;Patient/family education;Balance training;Neuromuscular education      OT Goals(Current goals can be found in the care plan section)   Acute Rehab OT Goals OT Goal Formulation: With patient/family Time For Goal Achievement:  04/07/24 Potential to Achieve Goals: Good ADL Goals Pt Will Perform Grooming: with modified independence;sitting Pt Will Perform Upper Body Dressing: with modified independence;sitting Pt Will Perform Lower Body Dressing: sitting/lateral leans;with modified independence Pt Will Transfer to Toilet: with supervision;squat pivot transfer;bedside commode Pt Will Perform Toileting - Clothing Manipulation and hygiene: with supervision;sitting/lateral leans Pt/caregiver will Perform Home Exercise Program: Right Upper extremity;Increased strength;With minimal assist Additional ADL Goal #1: Pt will complete bed mobility mod I in preparation for ADLs.   OT Frequency:  Min 3X/week    Co-evaluation PT/OT/SLP Co-Evaluation/Treatment: Yes Reason for Co-Treatment: For patient/therapist safety   OT goals addressed during session: ADL's and self-care      AM-PAC OT 6 Clicks Daily Activity     Outcome Measure Help from another person eating meals?: A Little Help from another person taking care of personal grooming?: A Little Help from another person toileting, which includes using toliet, bedpan, or urinal?: A Lot Help from another person bathing (including washing, rinsing, drying)?: A Lot Help from another person to put on and taking off regular upper body clothing?: A Lot Help from another person to put on and taking off regular lower body clothing?: Total 6 Click Score: 13   End of Session Equipment Utilized During Treatment: Gait belt  Activity Tolerance: Patient tolerated treatment well Patient left: in bed;with call bell/phone within reach;with family/visitor present  OT Visit Diagnosis: Unsteadiness on feet (R26.81);Hemiplegia and hemiparesis;History of falling (Z91.81);Other symptoms and signs involving cognitive function Hemiplegia - Right/Left: Right Hemiplegia - dominant/non-dominant:  Dominant Hemiplegia - caused by: Cerebral infarction                Time: 1027-1050 OT Time  Calculation (min): 23 min Charges:  OT General Charges $OT Visit: 1 Visit OT Evaluation $OT Eval Moderate Complexity: 1 Mod  Mliss HERO, OTR/L Acute Rehabilitation Services Office: [phone]   Kennth Mliss Helling 03/24/2024, 11:07 AM

## 2024-03-24 NOTE — Progress Notes (Signed)
 [facility] night cross cover note:   I was notified by the patient's RN that the patient is complaining of right hip discomfort and requesting pain medication to help address this.  I subsequently ordered fentanyl  25 mcg IV every 2 hours as needed in addition to an order for incentive spirometry.     Eva Pore, DO Hospitalist

## 2024-03-24 NOTE — ED Notes (Signed)
 Dr. Garrick made aware of change in pt's NIHSS

## 2024-03-24 NOTE — Progress Notes (Signed)
  Inpatient Rehab Admissions Coordinator :  Per therapy recommendations, patient was screened for CIR candidacy by Ottie Glazier RN MSN.  At this time patient appears to be a potential candidate for CIR. I will place a rehab consult per protocol for full assessment. Please call me with any questions.  Ottie Glazier RN MSN Admissions Coordinator [phone]

## 2024-03-25 ENCOUNTER — Other Ambulatory Visit (HOSPITAL_COMMUNITY): Payer: Self-pay

## 2024-03-25 DIAGNOSIS — I69391 Dysphagia following cerebral infarction: Secondary | ICD-10-CM

## 2024-03-25 DIAGNOSIS — F1010 Alcohol abuse, uncomplicated: Secondary | ICD-10-CM

## 2024-03-25 DIAGNOSIS — F17210 Nicotine dependence, cigarettes, uncomplicated: Secondary | ICD-10-CM

## 2024-03-25 DIAGNOSIS — R29708 NIHSS score 8: Secondary | ICD-10-CM

## 2024-03-25 DIAGNOSIS — I10 Essential (primary) hypertension: Secondary | ICD-10-CM

## 2024-03-25 DIAGNOSIS — I639 Cerebral infarction, unspecified: Secondary | ICD-10-CM

## 2024-03-25 DIAGNOSIS — I739 Peripheral vascular disease, unspecified: Secondary | ICD-10-CM

## 2024-03-25 DIAGNOSIS — E785 Hyperlipidemia, unspecified: Secondary | ICD-10-CM | POA: Diagnosis not present

## 2024-03-25 MED ORDER — ACETAMINOPHEN 500 MG PO TABS
1000.0000 mg | ORAL_TABLET | Freq: Three times a day (TID) | ORAL | Status: DC
  Administered 2024-03-25 – 2024-03-27 (×9): 1000 mg via ORAL
  Filled 2024-03-25 (×9): qty 2

## 2024-03-25 MED ORDER — THIAMINE MONONITRATE 100 MG PO TABS
100.0000 mg | ORAL_TABLET | Freq: Every day | ORAL | Status: DC
  Administered 2024-03-25 – 2024-03-28 (×4): 100 mg via ORAL
  Filled 2024-03-25 (×4): qty 1

## 2024-03-25 MED ORDER — LORAZEPAM 2 MG/ML IJ SOLN: 1.0000 mg | INTRAMUSCULAR | Status: AC | PRN

## 2024-03-25 MED ORDER — OXYCODONE HCL 5 MG PO TABS
5.0000 mg | ORAL_TABLET | Freq: Four times a day (QID) | ORAL | Status: DC | PRN
  Administered 2024-03-25 – 2024-03-27 (×6): 5 mg via ORAL
  Filled 2024-03-25 (×6): qty 1

## 2024-03-25 MED ORDER — TICAGRELOR 90 MG PO TABS
90.0000 mg | ORAL_TABLET | Freq: Two times a day (BID) | ORAL | Status: DC
  Administered 2024-03-26 – 2024-03-28 (×5): 90 mg via ORAL
  Filled 2024-03-25 (×5): qty 1

## 2024-03-25 MED ORDER — ADULT MULTIVITAMIN W/MINERALS CH
1.0000 | ORAL_TABLET | Freq: Every day | ORAL | Status: DC
  Administered 2024-03-25 – 2024-03-28 (×4): 1 via ORAL
  Filled 2024-03-25 (×4): qty 1

## 2024-03-25 MED ORDER — FOLIC ACID 1 MG PO TABS
1.0000 mg | ORAL_TABLET | Freq: Every day | ORAL | Status: DC
  Administered 2024-03-25 – 2024-03-28 (×4): 1 mg via ORAL
  Filled 2024-03-25 (×4): qty 1

## 2024-03-25 MED ORDER — THIAMINE HCL 100 MG/ML IJ SOLN: 100.0000 mg | Freq: Every day | INTRAMUSCULAR | Status: DC

## 2024-03-25 MED ORDER — LORAZEPAM 1 MG PO TABS: 1.0000 mg | ORAL_TABLET | ORAL | Status: AC | PRN

## 2024-03-25 MED ORDER — NICOTINE POLACRILEX 2 MG MT GUM: 2.0000 mg | CHEWING_GUM | OROMUCOSAL | Status: DC | PRN

## 2024-03-25 NOTE — Progress Notes (Signed)
 Physical Therapy Treatment Patient Details Name: Isaac Meza MRN: [id number] DOB: 16-Aug-1970 Today's Date: 03/25/2024   History of Present Illness Isaac Meza is a 53 y.o. M who presented to [facility] on 03/23/24 with R side weakness, falls, and difficulty swallowing. MRI + for evolution of recent L corona radiata infarcts. PMHx: HTN, heavy ETOH use, and asthma.    PT Comments  Pt greeted supine in bed, pleasant and agreeable to PT session. He is highly motivated to regain his PLOF and eager to participate in therapy. Pt advanced OOB mobility using the stedy. He completed sit<>stand x10 reps with minA. Pt initially was pulling himself up with crossbar. Transitioned him to use the up/down hand position for sequencing to carryover when he advances to the RW. Pt used LUE to placed RUE on crossbar of stedy and PT maintained pt's grip. He completed standing marches inside stedy with BUE support on crossbar and minA. Pt was able to fully weight shift and stand on a single-leg without knee buckling and achieve full foot clearance from platform. He was able to increased the height of his R foot within increased reps/time. Pt took intermittent standing/seated rest break. Patient will benefit from intensive inpatient follow-up therapy, >3 hours/day.     If plan is discharge home, recommend the following: A little help with walking and/or transfers;Help with stairs or ramp for entrance;Assistance with cooking/housework;Assist for transportation;A little help with bathing/dressing/bathroom   Can travel by private vehicle        Equipment Recommendations  Wheelchair cushion (measurements PT);Wheelchair (measurements PT);BSC/3in1    Recommendations for Other Services       Precautions / Restrictions Precautions Precautions: Fall Recall of Precautions/Restrictions: Intact Restrictions Weight Bearing Restrictions Per Provider Order: No     Mobility  Bed Mobility Overal bed mobility: Needs  Assistance Bed Mobility: Supine to Sit     Supine to sit: Min assist, HOB elevated     General bed mobility comments: Pt sat up on L side of bed with increased time. Assist to manage RHB. Pt brought LLE off EOB and pulled on bed rail with LUE. Light assist to elevate trunk. Pt scooted fwd with LUE support and aid of bed pad.    Transfers Overall transfer level: Needs assistance Equipment used: Ambulation equipment used Transfers: Sit to/from Stand, Bed to chair/wheelchair/BSC Sit to Stand: Min assist           General transfer comment: Pt performed sit<>stand from slightly raised bed height using stedy x10 reps. Bilateral knees were blocked, no buckling noted. He initially pulled up with LUE while PT maintained RUE on crossbar of stedy. Educated pt on proper hand placement/sequencing for using RW in the future. He placed RUE on crossbar with assist from PT and PT maintaining grip and then pushed up from bed with LUE. Powered up with minA. Decreased assist from the steady seat, only CGA. Transferred to recliner chair via stedy. Good eccentric control. Pt's RUE would drop, PT provided support to prevent sudden force. Transfer via Lift Equipment: Stedy  Ambulation/Gait             Pre-gait activities: Pt engaged in weight shifts and standing marches inside stedy with minA for support.     Stairs             Wheelchair Mobility     Tilt Bed    Modified Rankin (Stroke Patients Only) Modified Rankin (Stroke Patients Only) Pre-Morbid Rankin Score: No symptoms Modified Rankin: Severe disability  Balance Overall balance assessment: Needs assistance Sitting-balance support: Single extremity supported, Feet supported Sitting balance-Leahy Scale: Good     Standing balance support: Single extremity supported, During functional activity, Reliant on assistive device for balance Standing balance-Leahy Scale: Poor Standing balance comment: Pt dependent on stedy and  minA                            Communication Communication Communication: Impaired Factors Affecting Communication: Reduced clarity of speech  Cognition Arousal: Alert Behavior During Therapy: WFL for tasks assessed/performed, Flat affect   PT - Cognitive impairments: No apparent impairments                       PT - Cognition Comments: Pt A,Ox4 Following commands: Intact      Cueing Cueing Techniques: Verbal cues, Gestural cues, Tactile cues  Exercises General Exercises - Lower Extremity Ankle Circles/Pumps: Seated, Right, AAROM, 10 reps Long Arc Quad: Seated, Right, AAROM, 10 reps Hip Flexion/Marching: Standing, Both, AROM, 20 reps (BUE support on stedy, minA for stability. Intermittent standing/seated rest breaks.)    General Comments General comments (skin integrity, edema, etc.): Bruise noted on pt's posterior R lower back near hip      Pertinent Vitals/Pain Pain Assessment Pain Assessment: Faces Faces Pain Scale: Hurts little more Pain Location: R lower back/hip Pain Descriptors / Indicators: Discomfort, Aching, Sore Pain Intervention(s): Monitored during session, Limited activity within patient's tolerance, Repositioned, Patient requesting pain meds-RN notified    Home Living                          Prior Function            PT Goals (current goals can now be found in the care plan section) Acute Rehab PT Goals Patient Stated Goal: Regain my independence PT Goal Formulation: With patient/family Time For Goal Achievement: 04/07/24 Potential to Achieve Goals: Fair Progress towards PT goals: Progressing toward goals    Frequency    Min 3X/week      PT Plan      Co-evaluation              AM-PAC PT 6 Clicks Mobility   Outcome Measure  Help needed turning from your back to your side while in a flat bed without using bedrails?: A Little Help needed moving from lying on your back to sitting on the side of a  flat bed without using bedrails?: A Little Help needed moving to and from a bed to a chair (including a wheelchair)?: A Lot Help needed standing up from a chair using your arms (e.g., wheelchair or bedside chair)?: A Little Help needed to walk in hospital room?: Total Help needed climbing 3-5 steps with a railing? : Total 6 Click Score: 13    End of Session Equipment Utilized During Treatment: Gait belt Activity Tolerance: Patient tolerated treatment well Patient left: in chair;with call bell/phone within reach;with chair alarm set Nurse Communication: Mobility status;Need for lift equipment (stedy) PT Visit Diagnosis: Unsteadiness on feet (R26.81);Other abnormalities of gait and mobility (R26.89);Hemiplegia and hemiparesis;Pain Hemiplegia - Right/Left: Right Hemiplegia - dominant/non-dominant: Dominant Hemiplegia - caused by: Cerebral infarction Pain - Right/Left: Right Pain - part of body: Hip     Time: 1505-1530 PT Time Calculation (min) (ACUTE ONLY): 25 min  Charges:    $Therapeutic Exercise: 8-22 mins $Therapeutic Activity: 8-22 mins PT General Charges $$ ACUTE  PT VISIT: 1 Visit                     Randall SAUNDERS, PT, DPT Acute Rehabilitation Services Office: [phone] Secure Chat Preferred  Delon CHRISTELLA Callander 03/25/2024, 4:23 PM

## 2024-03-25 NOTE — Progress Notes (Addendum)
 STROKE TEAM PROGRESS NOTE   INTERIM HISTORY/SUBJECTIVE  Patient remains hemodynamically stable and afebrile overnight.  He continues to have right-sided weakness with complete inability to move the right arm. MRI scan shows a fairly large greater than 2 cm left subcortical infarct which has increased in size compared to MRI from a week prior. OBJECTIVE  CBC    Component Value Date/Time   WBC 11.8 (H) 03/23/2024 1200   RBC 3.72 (L) 03/23/2024 1200   HGB 12.1 (L) 03/23/2024 1200   HGB 14.4 08/14/2022 0958   HCT 35.0 (L) 03/23/2024 1200   HCT 42.5 08/14/2022 0958   PLT 303 03/23/2024 1200   MCV 94.1 03/23/2024 1200   MCV 93 08/14/2022 0958   MCH 32.5 03/23/2024 1200   MCHC 34.6 03/23/2024 1200   RDW 12.2 03/23/2024 1200   RDW 11.9 08/14/2022 0958   LYMPHSABS 2.3 03/23/2024 1200   LYMPHSABS 2.3 08/14/2022 0958   MONOABS 1.3 (H) 03/23/2024 1200   EOSABS 0.0 03/23/2024 1200   EOSABS 0.1 08/14/2022 0958   BASOSABS 0.0 03/23/2024 1200   BASOSABS 0.0 08/14/2022 0958    BMET    Component Value Date/Time   NA 136 03/23/2024 1200   NA 136 08/14/2022 0958   K 4.1 03/23/2024 1200   CL 100 03/23/2024 1200   CO2 25 03/23/2024 1200   GLUCOSE 125 (H) 03/23/2024 1200   BUN 11 03/23/2024 1200   BUN 8 08/14/2022 0958   CREATININE 0.63 03/23/2024 1200   CREATININE 0.82 11/24/2023 0000   CALCIUM  9.1 03/23/2024 1200   EGFR 106 11/24/2023 0000   EGFR 112 08/14/2022 0958   GFRNONAA >60 03/23/2024 1200    IMAGING past 24 hours No results found.  Vitals:   03/24/24 2345 03/25/24 0427 03/25/24 0755 03/25/24 1122  BP: 124/71 116/69 113/77 110/80  Pulse: 75 82 86 63  Resp: 16 16 16 16   Temp: 98 F (36.7 C) 98.2 F (36.8 C) 97.9 F (36.6 C) 98.2 F (36.8 C)  TempSrc: Rectal Oral Oral Oral  SpO2: 98% 100% 98% 100%     PHYSICAL EXAM General:  Alert, well-nourished, well-developed patient in no acute distress Psych:  Mood and affect appropriate for situation Respiratory:   Regular, unlabored respirations on room air   NEURO:  Mental Status: AA&Ox3, patient is able to give clear and coherent history Speech/Language: speech is without dysarthria or aphasia.   Cranial Nerves:  II: PERRL. Visual fields full.  III, IV, VI: EOMI. Eyelids elevate symmetrically.  VII: Right facial droop VIII: hearing intact to voice. IX, X: Phonation is normal.  XII: tongue is midline without fasciculations. Motor: Able to move left upper and lower extremities with good antigravity strength, no motion of right upper extremity, moves right lower extremity but does not lift it off the bed Tone: is normal and bulk is normal Sensation- Intact to light touch bilaterally.  Gait- deferred  Most Recent NIH  1a Level of Conscious.: 0 1b LOC Questions: 0 1c LOC Commands: 0 2 Best Gaze: 0 3 Visual: 0 4 Facial Palsy: 1 5a Motor Arm - left: 0 5b Motor Arm - Right: 4 6a Motor Leg - Left: 0 6b Motor Leg - Right: 3 7 Limb Ataxia: 0 8 Sensory: 0 9 Best Language: 0 10 Dysarthria: 0 11 Extinct. and Inatten.: 0 TOTAL: 8   ASSESSMENT/PLAN  Isaac Meza is a 53 y.o. male with history of asthma, hypertension and heavy alcohol use seen on 12/11 for left corona radiata  infarct admitted for worsening right-sided weakness.  He was found to have interval expansion of left basal ganglia infarct on MRI.  NIH on Admission 12  Acute Ischemic Infarct:  left radiata and basal ganglia infarct Etiology: Small vessel disease with expansion of previous stroke CT head No acute abnormality.  CTA head & neck 12/11 no LVO, moderate bilateral paraclinoid ICA stenosis MRI 12/11 demonstrates patchy left basal ganglia infarct MRI interval expansion and worsening of posterior left basal ganglia infarct, now measuring 2.9 cm in size with no associated hemorrhage CT head 12/18 evolving infarct in left basal ganglia/corona radiata 2D Echo EF 60 to 65%, normal left atrial size, no atrial level shunt LDL  47 HgbA1c 5.4 VTE prophylaxis -SCDs aspirin  81 mg daily and clopidogrel  75 mg daily prior to admission, now on aspirin  81 mg daily and Brilinta (ticagrelor) 90 mg bid for 4 weeks and then aspirin  alone. Therapy recommendations:  CIR Disposition: Pending  Hx of Stroke/TIA Patient was admitted 12/11 with patchy left basal ganglia infarct  Hypertension Home meds: Amlodipine  5 mg daily, lisinopril  20 mg daily Stable Blood Pressure Goal: BP less than 220/110   Hyperlipidemia Home meds: Rosuvastatin  20 mg daily, resumed in hospital LDL 47, goal < 70 Continue statin at discharge  Tobacco Abuse Patient smokes 0.5 packs per day for 40 years Nicotine replacement therapy provided  Heavy alcohol use Patient has 3 drinks per day CIWA protocol with lorazepam  initiated  Dysphagia Patient has post-stroke dysphagia, SLP consulted    Diet   Diet regular Room service appropriate? Yes; Fluid consistency: Thin   Advance diet as tolerated  Other Stroke Risk Factors ETOH use, alcohol level 155, advised to abstain from alcohol   Other Active Problems None  Hospital day # 1  Patient seen by NP with MD, MD to edit note as needed. Cortney E Everitt Clint Kill , MSN, AGACNP-BC [facility] See Amion for schedule and pager information 03/25/2024 11:53 AM   I have personally obtained history,examined this patient, reviewed notes, independently viewed imaging studies, participated in medical decision making and plan of care.ROS completed by me personally and pertinent positives fully documented  I have made any additions or clarifications directly to the above note. Agree with note above.  Patient with recent left subcortical infarct with right hemiparesis presents with significant worsening of his existing right hemiparesis due to expansion of the recent left subcortical infarct.  Recommend change aspirin  and Plavix  to aspirin  and Brilinta for 4 weeks followed by aspirin  alone and aggressive  risk factor modification.  Mobilize out of bed.  Therapy consults.  Will likely need rehab.  Long discussion with patient and family at the bedside and answered questions.   I personally spent a total of 50 minutes in the care of the patient today including getting/reviewing separately obtained history, performing a medically appropriate exam/evaluation, counseling and educating, placing orders, referring and communicating with other health care professionals, documenting clinical information in the EHR, independently interpreting results, and coordinating care.   Stroke team will sign off.  Kindly call for questions.  Follow-up as an outpatient stroke clinic with nurse practitioner in 2 months    Eather Popp, MD Medical Director [facility] Stroke [facility] Pager: [phone] 03/25/2024 11:55 AM  To contact Stroke Continuity provider, please refer to [email or web address]. After hours, contact General Neurology

## 2024-03-25 NOTE — Progress Notes (Signed)
" ° ° °  Inpatient Rehabilitation Admissions Coordinator   Met with patient and spoke with wife by phone at bedside for rehab assessment. We discussed goals and expectations of a possible CIR admit. They prefer CIR for rehab. Wife works, but her Mom and Dad live with them. Family can provide expected caregiver support that is recommended. I will begin insurance Auth with [employer] [place] [employer] for possible CIR admit pending approval. Please call me with any questions.   Heron Leavell, RN, MSN Rehab Admissions Coordinator [phone]   "

## 2024-03-25 NOTE — TOC CAGE-AID Note (Signed)
 Transition of Care [facility]) - CAGE-AID Screening   Patient Details  Name: Isaac Meza MRN: [id number] Date of Birth: 1971-01-30  [facility]) CM/SW Contact:    Andrez JULIANNA George, RN Phone Number: 03/25/2024, 10:37 AM   Clinical Narrative:  Pt denied the need for counseling resources.   CAGE-AID Screening:    Have You Ever Felt You Ought to Cut Down on Your Drinking or Drug Use?: No Have People Annoyed You By Critizing Your Drinking Or Drug Use?: No Have You Felt Bad Or Guilty About Your Drinking Or Drug Use?: No Have You Ever Had a Drink or Used Drugs First Thing In The Morning to Steady Your Nerves or to Get Rid of a Hangover?: No CAGE-AID Score: 0  Substance Abuse Education Offered: Yes (refused)

## 2024-03-25 NOTE — PMR Pre-admission (Signed)
 PMR Admission Coordinator Pre-Admission Assessment  Patient: Isaac Meza is an 53 y.o., male MRN: [id number] DOB: Jul 06, 1970 Height:   Weight:    Insurance Information HMO:     PPO:      PCP:      IPA:      80/20:      OTHER:  PRIMARY: [place] Medicaid Healthy [employer]      Policy#: [id number]      Subscriber: pt CM Name: TBD      Phone#: TBD     Fax#: [phone]/concurrent review [phone] Pre-Cert#: [id number]  Received authorization on 03/28/24 for admit 03/28/24 with next review date 12/31.  Updates due to fax listed above.        Employer:  Benefits:  Phone #: [phone]     Name: 12/19 Eff. Date: 03/07/22 until 03/06/2025     Deduct: none      Out of Pocket Max: none      Life Max: none CIR: 100% per medicaid guidelines      SNF: per MCD  Outpatient: $ 4 per visit     Co-Pay:  Home Health: per MCD      Co-Pay:  DME: per MCD     Co-Pay:  Providers: in network  SECONDARY: none      Policy#:      Phone#:   Artist:       Phone#:   The Best Boy for patients in Inpatient Rehabilitation Facilities with attached Privacy Act Statement-Health Care Records was provided and verbally reviewed with: Patient and Family  Emergency Contact Information Contact Information     Name Relation Home Work Mobile   Washer,Dawn Spouse [phone]  [phone]      Other Contacts   None on File    Current Medical History  Patient Admitting Diagnosis: CVA  History of Present Illness: 53 yo male with history of asthma, HTN< alcohol use disorder and recent admit 12/11 until 03/19/24 for acute CVA. He was started on ASA, Plavix  and Crestor  and discharged home with [facility]. Due to worsening symptoms after home, presented on 12/18. Slurred speech and ataxic gait.  He was not a candidate for thrombolytic therapy. MRI shows a fairly large greater than 2 cm left subcortical infarct which has increased in size compared to last admits MRI.Etiology felt to be  expansion of previous CVA. 2 d echo EF 60 to 65%, no atrial shunt. ON ASA and clopidogrel , now placed on ASA and Brilinta  for 4 weeks and then ASA alone. Home meds of amlodipine  and lisinopril  for HTN.   Rosuvastatin  resumed for LDL is 45. Advised to not smoke or drink. Post stroke dysphagia and SLP consulted.    Complete NIHSS TOTAL: 12  Patient's medical record from [facility] has been reviewed by the rehabilitation admission coordinator and physician.  Past Medical History  Past Medical History:  Diagnosis Date   Asthma    childhood   Hypertension    Has the patient had major surgery during 100 days prior to admission? No  Family History   family history is not on file.  Current Medications Current Medications[1]  Patients Current Diet:  Diet Order             Diet regular Room service appropriate? Yes; Fluid consistency: Thin  Diet effective now                  Precautions / Restrictions Precautions Precautions: Fall Precaution/Restrictions Comments: impulsive Restrictions Weight Bearing Restrictions  Per Provider Order: No   Has the patient had 2 or more falls or a fall with injury in the past year? Yes since d/c home after initial CVA  Prior Activity Level Community (5-7x/wk): independent working 2 jobs  Prior Functional Level Self Care: Did the patient need help bathing, dressing, using the toilet or eating? Independent  Indoor Mobility: Did the patient need assistance with walking from room to room (with or without device)? Independent  Stairs: Did the patient need assistance with internal or external stairs (with or without device)? Independent  Functional Cognition: Did the patient need help planning regular tasks such as shopping or remembering to take medications? Independent  Patient Information Are you of Hispanic, Latino/a,or Spanish origin?: A. No, not of Hispanic, Latino/a, or Spanish origin What is your race?: A. White Do you need  or want an interpreter to communicate with a doctor or health care staff?: 0. No  Patient's Response To:  Health Literacy and Transportation Is the patient able to respond to health literacy and transportation needs?: Yes Health Literacy - How often do you need to have someone help you when you read instructions, pamphlets, or other written material from your doctor or pharmacy?: Never In the past 12 months, has lack of transportation kept you from medical appointments or from getting medications?: No In the past 12 months, has lack of transportation kept you from meetings, work, or from getting things needed for daily living?: No  Home Assistive Devices / Equipment Home Equipment: None  Prior Device Use: Indicate devices/aids used by the patient prior to current illness, exacerbation or injury? None of the above  Current Functional Level Cognition  Arousal/Alertness: Awake/alert Overall Cognitive Status: Impaired/Different from baseline Orientation Level: Oriented X4    Extremity Assessment (includes Sensation/Coordination)  Upper Extremity Assessment: Defer to OT evaluation RUE Deficits / Details: Full PROM, no voluntary movement RUE Sensation: decreased light touch, decreased proprioception RUE Coordination: decreased fine motor, decreased gross motor  Lower Extremity Assessment: RLE deficits/detail RLE Deficits / Details: Pt has 2/5 quad contraction in standing, unable to activate in non-WB. Pt with 2/5 HS strength for knee flexion, 0/5 noted in the ankle DF/PF, 1/5 hip abd    ADLs  Overall ADL's : Needs assistance/impaired Eating/Feeding: Minimal assistance, Sitting, Bed level Grooming: Set up, Sitting, Bed level Upper Body Bathing: Moderate assistance, Sitting Lower Body Bathing: Sitting/lateral leans, Maximal assistance Upper Body Dressing : Moderate assistance, Sitting Lower Body Dressing: Total assistance, Bed level    Mobility  Overal bed mobility: Needs  Assistance Bed Mobility: Supine to Sit, Sit to Supine Supine to sit: Min assist, HOB elevated Sit to supine: Min assist, HOB elevated General bed mobility comments: Assist to manage RUE/RLE. Pt able to pull on bed pad to elevate trunk and use bed rail to scoot hips fwd til feet flat. Assist to bring RLE back into bed. Repositioned so RUE was elevated on pillow.    Transfers  Overall transfer level: Needs assistance Equipment used: Ambulation equipment used Transfers: Sit to/from Stand Sit to Stand: Min assist, Via lift equipment Bed to/from chair/wheelchair/BSC transfer type:: Via Financial Planner via Lift Equipment: [employer] transfer comment: Pt stood using stedy. He completed 5x5 sit<>stands. PT assisted in placing RUE on crossbar and maintained grip. Pt pulled up with LUE on crossbar. Noted a bias to the left side when powering up. Staggered pt's feet so RLE was closer to the bed. Cued pt to increase fwd lean and come up in  the middle or slightly biased to the right. Seated rest breaks between sets. Good eccentric control. PT aiding in bring RUE back to bed.    Ambulation / Gait / Stairs / Wheelchair Mobility  Ambulation/Gait Ambulation/Gait assistance: Mod assist, +2 safety/equipment, Max assist (close chair follow) Gait Distance (Feet): 10 Feet (1x10, prolonged seated rest, 1x5, seated rest, 1x5) Assistive device: Rolling walker (2 wheels) Gait Pattern/deviations: Step-to pattern, Decreased stride length, Decreased stance time - right, Decreased weight shift to right, Decreased dorsiflexion - right, Ataxic, Knee flexed in stance - right, Knees buckling General Gait Details: Pt took short slow steps. He had difficulty placing RLE with ankle inverting. Increased time and multi-modal cues to correct RLE position. Pt with limited R quad control. He had difficulty turning, taking multiple short steps. Pt's R knee buckled and he had a sudden significant R lateral lean requiring maxA  to correct. PT quickly faciliated pt to rest in standard chair. Discussed the importance of taking seated rest break early. As pt fatigued, he lacked R foot clearence and required physical assist to adjust RLE. Gait velocity: decr Pre-gait activities: Pt engaged in weight shifts and standing marches inside stedy with minA for support.    Posture / Balance Balance Overall balance assessment: Needs assistance Sitting-balance support: Single extremity supported, Feet supported Sitting balance-Leahy Scale: Good Postural control: Right lateral lean Standing balance support: Bilateral upper extremity supported, During functional activity, Reliant on assistive device for balance Standing balance-Leahy Scale: Poor Standing balance comment: Pt stood using stedy. He demonstrated a bias towards the L able to correct to netural with mutli-modal cues.    Special considerations/life events  Smoker and ETOH use disorder Fall precautions   Previous Home Environment  Living Arrangements:  (spouse , mother and father in law live with couple)  Lives With: Spouse, Family Available Help at Discharge:  (wife works days and in medical illustrator when wife works) Type of Home: [employer] Layout: Two level, Able to live on main level with bedroom/bathroom Home Access: Stairs to enter Entrance Stairs-Rails: None Secretary/administrator of Steps: 2 Bathroom Shower/Tub: Engineer, Manufacturing Systems: Standard Bathroom Accessibility: Yes How Accessible: Accessible via walker Home Care Services: No  Discharge Living Setting Plans for Discharge Living Setting: Patient's home, House, Lives with (comment) (wife and in laws) Type of Home at Discharge: House Discharge Home Layout: Two level, Able to live on main level with bedroom/bathroom Alternate Level Stairs-Rails: None Discharge Home Access: Stairs to enter Entrance Stairs-Rails: None Entrance Stairs-Number of Steps: 2 Discharge Bathroom Shower/Tub:  Tub/shower unit Discharge Bathroom Toilet: Standard Discharge Bathroom Accessibility: Yes How Accessible: Accessible via walker Does the patient have any problems obtaining your medications?: No  Social/Family/Support Systems Patient Roles: Spouse Contact Information: wife, Dawn Anticipated Caregiver: wife and in laws when wife works Anticipated Industrial/product Designer Information: see contacts Ability/Limitations of Caregiver: wife works day shift. in laws able and can asisst Caregiver Availability: 24/7 Discharge Plan Discussed with Primary Caregiver: Yes Is Caregiver In Agreement with Plan?: Yes Does Caregiver/Family have Issues with Lodging/Transportation while Pt is in Rehab?: No  Goals Patient/Family Goal for Rehab: supervision with PT, OT and SLP Expected length of stay: ELOS 10 to 14 days Pt/Family Agrees to Admission and willing to participate: Yes Program Orientation Provided & Reviewed with Pt/Caregiver Including Roles  & Responsibilities: Yes  Decrease burden of Care through IP rehab admission: n/a  Possible need for SNF placement upon discharge: not anticipated  Patient Condition: I have reviewed medical  records from [facility], spoken with CM, and patient and spouse. I met with patient at the bedside and discussed via phone for inpatient rehabilitation assessment.  Patient will benefit from ongoing PT, OT, and SLP, can actively participate in 3 hours of therapy a day 5 days of the week, and can make measurable gains during the admission.  Patient will also benefit from the coordinated team approach during an Inpatient Acute Rehabilitation admission.  The patient will receive intensive therapy as well as Rehabilitation physician, nursing, social worker, and care management interventions.  Due to bladder management, bowel management, safety, skin/wound care, disease management, medication administration, pain management, and patient education the patient requires 24 hour a  day rehabilitation nursing.  The patient is currently Min A with mobility and Mod-Total A with basic ADLs.  Discharge setting and therapy post discharge at home with home health is anticipated.  Patient has agreed to participate in the Acute Inpatient Rehabilitation Program and will admit today.  Preadmission Screen Completed By:  Alison Heron Lot, RN MSN 03/28/2024 9:55 AM ______________________________________________________________________   Discussed status with Dr. Carilyn on 03/28/2024  at 9:55 AM and received approval for admission today.  Admission Coordinator:  Heron Lot Alison, RN MSN time 9:55 AM/Date 03/28/2024    Assessment/Plan: Diagnosis: Left subcortical infarct Does the need for close, 24 hr/day Medical supervision in concert with the patient's rehab needs make it unreasonable for this patient to be served in a less intensive setting? Yes Co-Morbidities requiring supervision/potential complications: Alcoholism, hypertension  Due to bladder management, bowel management, safety, skin/wound care, disease management, medication administration, pain management, and patient education, does the patient require 24 hr/day rehab nursing? Yes Does the patient require coordinated care of a physician, rehab nurse, PT, OT, and SLP to address physical and functional deficits in the context of the above medical diagnosis(es)? Yes Addressing deficits in the following areas: balance, endurance, locomotion, strength, transferring, bowel/bladder control, bathing, dressing, toileting, speech, and swallowing Can the patient actively participate in an intensive therapy program of at least 3 hrs of therapy 5 days a week? Yes The potential for patient to make measurable gains while on inpatient rehab is good Anticipated functional outcomes upon discharge from inpatient rehab: supervision PT, supervision OT, supervision SLP Estimated rehab length of stay to reach the above functional goals  is: 10-14d Anticipated discharge destination: Home 10. Overall Rehab/Functional Prognosis: good   MD Signature: Prentice [name] Carilyn M.D. [facility] Fellow Am Acad of Phys Med and Rehab Diplomate Am Board of Electrodiagnostic Med Fellow Am Board of Interventional Pain      [1]  Current Facility-Administered Medications:    acetaminophen  (TYLENOL ) tablet 1,000 mg, 1,000 mg, Oral, TID, Dahal, Binaya, MD, 1,000 mg at 03/27/24 2117   aspirin  EC tablet 81 mg, 81 mg, Oral, Daily, Randol Simmonds, MD, 81 mg at 03/28/24 0825   folic acid  (FOLVITE ) tablet 1 mg, 1 mg, Oral, Daily, Dahal, Binaya, MD, 1 mg at 03/28/24 9175   multivitamin with minerals tablet 1 tablet, 1 tablet, Oral, Daily, Dahal, Binaya, MD, 1 tablet at 03/28/24 9175   naloxone  (NARCAN ) injection 0.4 mg, 0.4 mg, Intravenous, PRN, Howerter, Justin B, DO   nicotine  polacrilex (NICORETTE ) gum 2 mg, 2 mg, Oral, PRN, de Clint Kill, Cortney E, NP   oxyCODONE  (Oxy IR/ROXICODONE ) immediate release tablet 5 mg, 5 mg, Oral, Q6H PRN, Dahal, Binaya, MD, 5 mg at 03/27/24 9081   rosuvastatin  (CRESTOR ) tablet 20 mg, 20 mg, Oral, Daily, Randol,  Thom, MD, 20 mg at 03/28/24 9175   thiamine  (VITAMIN B1) tablet 100 mg, 100 mg, Oral, Daily, 100 mg at 03/28/24 0824 **OR** [DISCONTINUED] thiamine  (VITAMIN B1) injection 100 mg, 100 mg, Intravenous, Daily, Dahal, Binaya, MD   ticagrelor  (BRILINTA ) tablet 90 mg, 90 mg, Oral, BID, de Clint Kill, [place] E, NP, 90 mg at 03/28/24 [phone]

## 2024-03-25 NOTE — Progress Notes (Addendum)
 " PROGRESS NOTE  Isaac Meza  DOB: 07/02/1970  PCP: [name], DO FMW:[id number]  DOA: 03/23/2024  LOS: 1 day  Hospital Day: 3  Subjective: Patient was seen and examined this morning. Patient middle-aged Caucasian male. Sitting up in bed.  Alert, awake, oriented x 3.  Has dysarthria, right-sided weakness.  Brief narrative: Isaac Meza is a 53 y.o. male with PMH significant for chronic heavy alcoholism, chronic smoking, HTN, asthma who recently was hospitalized for acute stroke 12/11 -12/13. He had presented with acute onset right-sided weakness, slurred speech, ataxic gait for 2 days. MRI brain showed small acute infarct in the left coronary due to concerning for small vessel disease.  Stroke workup was completed.  Patient was started on DAPT with ASA, Plavix  and Crestor .  Ultimately discharged home with PT/OT.  By the time of discharge, he was able to walk with a walker mainly using his left but able to hold onto the walker with the right hand.  He also had residual dysarthria.   At home, he slowly declined again and gradually started have worsening of pre-existing right-sided deficits, was not able to walk and hence brought to ED on 12/17  Workup in the ED: CT head did not show any acute findings, it showed evidence of known subacute infarct over the left corona radiata MRI brain showed - Interval expansion and worsening of the recently identified posterior left basal ganglia infarct now measuring 2.9 cm in size, without associated hemorrhage or mass effect - Also showed mild chronic microvascular ischemic changes with remote likely infarcts about the bilateral basal and thalami  Seen by neurology [facility]  Assessment and plan: Acute CVA Presented with progressive worsening of pre-existing right-sided deficits, dysarthria  MRI brain as above showed interval expansion worsening with recently identified posterior left basal ganglia infarct without associated hemorrhage or  mass effect Seen by neurology  Recently completed stroke workup last week Per neurology, would favor permissive hypertension for longer period. Neurochecks PT/OT eval obtained.  Recommended CIR After the recent stroke, patient was started on ASA 81mg  daily, Plavix  75mg  daily x21 days 12/19, neurology recommended to switch Plavix  to Brilinta . So for the next 4 weeks, patient will be on aspirin  81 mg daily, Brilinta  90 mg twice daily.  After that he will be on aspirin  alone. Continue Crestor  20mg  daily  HTN PTA amlodipine  5mg  daily and lisinopril  20mg  daily Currently on hold to allow prolonged permissive hypertension   Chronic alcohol use At risk of withdrawal CIWA protocol with as needed Ativan   Right hip pain Start scheduled Tylenol  and as needed oxycodone     Nutrition Status:         Mobility:   PT Orders: Active   PT Follow up Rec: Acute Inpatient Rehab (3hours/Day)03/24/2024 1213    Goals of care   Code Status: Full Code     DVT prophylaxis:  SCDs Start: 03/24/24 1050   Antimicrobials: None Fluid: None Consultants: Neurology Family Communication: None at bedside  Status: Inpatient Level of care:  Telemetry   Patient is from: Home Needs to continue in-hospital care: Medically stable Anticipated d/c to: CIR recommended      Diet:  Diet Order             Diet regular Room service appropriate? Yes; Fluid consistency: Thin  Diet effective now                   Scheduled Meds:  acetaminophen   1,000 mg Oral TID   aspirin   EC  81 mg Oral Daily   folic acid   1 mg Oral Daily   multivitamin with minerals  1 tablet Oral Daily   rosuvastatin   20 mg Oral Daily   thiamine   100 mg Oral Daily   [START ON 03/26/2024] ticagrelor   90 mg Oral BID    PRN meds: LORazepam  **OR** LORazepam , naLOXone  (NARCAN )  injection, nicotine  polacrilex, oxyCODONE    Infusions:    Antimicrobials: Anti-infectives (From admission, onward)    None        Objective: Vitals:   03/25/24 0755 03/25/24 1122  BP: 113/77 110/80  Pulse: 86 63  Resp: 16 16  Temp: 97.9 F (36.6 C) 98.2 F (36.8 C)  SpO2: 98% 100%    Intake/Output Summary (Last 24 hours) at 03/25/2024 1309 Last data filed at 03/25/2024 0556 Gross per 24 hour  Intake 120 ml  Output 1300 ml  Net -1180 ml   There were no vitals filed for this visit. Weight change:  There is no height or weight on file to calculate BMI.   Physical Exam: General exam: Pleasant, middle-aged Caucasian male Skin: No rashes, lesions or ulcers. HEENT: Atraumatic, normocephalic, no obvious bleeding Lungs: Clear to auscultation bilaterally,  CVS: S1, S2, no murmur,   GI/Abd: Soft, nontender, nondistended, bowel sound present,   CNS: Alert, awake, oriented x 3.  Continues to have right-sided weakness, left facial deviation, dysarthria Psychiatry: Sad affect Extremities: No pedal edema, no calf tenderness,   Data Review: I have personally reviewed the laboratory data and studies available.  F/u labs ordered Unresulted Labs (From admission, onward)    None       Signed, Chapman Rota, MD [facility] 03/25/2024  "

## 2024-03-25 NOTE — Plan of Care (Signed)

## 2024-03-26 DIAGNOSIS — I639 Cerebral infarction, unspecified: Secondary | ICD-10-CM | POA: Diagnosis not present

## 2024-03-26 NOTE — Progress Notes (Signed)
 Physical Therapy Treatment Patient Details Name: Isaac Meza MRN: [id number] DOB: 07-09-1970 Today's Date: 03/26/2024   History of Present Illness Isaac Meza is a 53 y.o. M who presented to [facility] on 03/23/24 with R side weakness, falls, and difficulty swallowing. MRI + for evolution of recent L corona radiata infarcts. PMHx: HTN, heavy ETOH use, and asthma.    PT Comments  Pt eager to progress OOB mobility. He performed sit<>stand using RW with minA. Pt with RLE ataxia and weakness limiting mobility, but able to progress to short-distance gait within the room using RW and +2 assist. Pt required modA and a close chair follow. He demonstrated poor safety awareness and decreased insight regarding activity tolerance. Pt's R knee buckled when attempting to turn, resulting in significant right lateral lean and requiring maxA to stabilize. PT quickly guided pt to rest in chair. As pt fatigued, he required increased assist to advance RLE and motor planning/control of RLE became more difficult. Educated pt/wife on RLE PROM HEP and discussed positioning consideration. Will continue to follow acutely and advance appropriately.      If plan is discharge home, recommend the following: A lot of help with walking and/or transfers;A little help with bathing/dressing/bathroom;Assistance with cooking/housework;Assist for transportation;Help with stairs or ramp for entrance   Can travel by private [employer] cushion (measurements PT);Wheelchair (measurements PT);Rolling walker (2 wheels);BSC/3in1    Recommendations for Other Services       Precautions / Restrictions Precautions Precautions: Fall Recall of Precautions/Restrictions: Impaired Restrictions Weight Bearing Restrictions Per Provider Order: No     Mobility  Bed Mobility Overal bed mobility: Needs Assistance Bed Mobility: Supine to Sit, Sit to Supine     Supine to sit: Min assist, HOB  elevated Sit to supine: Min assist, HOB elevated   General bed mobility comments: Pt sat up on R side of bed with increased time. Assist to manage RUE and RLE. He brought LLE off bed and pulled on bed rail with LUE. Scooted fwd til feet flat. Returning to bed pt required assist to manage RHB.    Transfers Overall transfer level: Needs assistance Equipment used: Rolling walker (2 wheels) Transfers: Sit to/from Stand Sit to Stand: Min assist           General transfer comment: Pt stood from lowest bed height and standard chair with bilateral arm rests. Cued proper hand placement. Pt assisted in placing RUE on RW grip with assist from PT and PT maintained graps on AD. Pt pushed up with LUE from bed/armrest. Powered up with minA. Increased time to stabilize feet once upright. Good eccentric control.    Ambulation/Gait Ambulation/Gait assistance: Mod assist, +2 safety/equipment, Max assist (close chair follow) Gait Distance (Feet): 10 Feet (1x10, prolonged seated rest, 1x5, seated rest, 1x5) Assistive device: Rolling walker (2 wheels) Gait Pattern/deviations: Step-to pattern, Decreased stride length, Decreased stance time - right, Decreased weight shift to right, Decreased dorsiflexion - right, Ataxic, Knee flexed in stance - right, Knees buckling Gait velocity: decr     General Gait Details: Pt took short slow steps. He had difficulty placing RLE with ankle inverting. Increased time and multi-modal cues to correct RLE position. Pt with limited R quad control. He had difficulty turning, taking multiple short steps. Pt's R knee buckled and he had a sudden significant R lateral lean requiring maxA to correct. PT quickly faciliated pt to rest in standard chair. Discussed the importance of taking seated rest  break early. As pt fatigued, he lacked R foot clearence and required physical assist to adjust RLE.   Stairs             Wheelchair Mobility     Tilt Bed    Modified Rankin  (Stroke Patients Only) Modified Rankin (Stroke Patients Only) Pre-Morbid Rankin Score: No symptoms Modified Rankin: Moderately severe disability     Balance Overall balance assessment: Needs assistance Sitting-balance support: Single extremity supported, Feet supported Sitting balance-Leahy Scale: Good   Postural control: Right lateral lean Standing balance support: Bilateral upper extremity supported, During functional activity, Reliant on assistive device for balance Standing balance-Leahy Scale: Poor Standing balance comment: Pt dependent on RW and mod-maxA from PT. Pt had a significant LOB d/t R knee buckling, resulting in significant R lat lean. PT corrected with maxA, provided knee block, stabilized pt in an upright postion quickly transitioning him to standard chair to rest.                            Communication Communication Communication: Impaired Factors Affecting Communication: Reduced clarity of speech  Cognition Arousal: Alert Behavior During Therapy: WFL for tasks assessed/performed, Flat affect   PT - Cognitive impairments: Sequencing, Problem solving, Safety/Judgement, Awareness                       PT - Cognition Comments: Pt eager to mobilize and push himself. Poor safety awareness and decreased insight on activity tolerance. Pt required cues for proper sequencing before most sit<>stands and every step during gait. Following commands: Intact      Cueing Cueing Techniques: Verbal cues, Gestural cues, Tactile cues  Exercises General Exercises - Lower Extremity Ankle Circles/Pumps: Supine, 5 reps, PROM, AAROM Heel Slides: Supine, 5 reps, PROM, AAROM Hip ABduction/ADduction: Supine, 5 reps, PROM, AAROM Hip Flexion/Marching: Supine, 5 reps, PROM, AAROM Other Exercises Other Exercises: Hip Internal Rotation: Supine;PROM;5 reps;    General Comments General comments (skin integrity, edema, etc.): Educated pt/wife on positioning  considerations, use of pillows to elevate and support RHB, and P/AAROM HEP for RLE. Pt encouraged to complete OOB<>chair 3x/day with staff assistance using stedy.      Pertinent Vitals/Pain Pain Assessment Pain Assessment: 0-10 Pain Score: 5  Pain Location: R lower back, R hip, med/lat R thigh Pain Descriptors / Indicators: Discomfort, Aching, Sore Pain Intervention(s): Monitored during session, Repositioned    Home Living                          Prior Function            PT Goals (current goals can now be found in the care plan section) Acute Rehab PT Goals Patient Stated Goal: Regain my independence PT Goal Formulation: With patient/family Time For Goal Achievement: 04/07/24 Potential to Achieve Goals: Fair Progress towards PT goals: Progressing toward goals    Frequency    Min 3X/week      PT Plan      Co-evaluation              AM-PAC PT 6 Clicks Mobility   Outcome Measure  Help needed turning from your back to your side while in a flat bed without using bedrails?: A Little Help needed moving from lying on your back to sitting on the side of a flat bed without using bedrails?: A Little Help needed moving to and from a bed  to a chair (including a wheelchair)?: A Lot Help needed standing up from a chair using your arms (e.g., wheelchair or bedside chair)?: A Little Help needed to walk in hospital room?: Total Help needed climbing 3-5 steps with a railing? : Total 6 Click Score: 13    End of Session Equipment Utilized During Treatment: Gait belt Activity Tolerance: Patient tolerated treatment well Patient left: in bed;with call bell/phone within reach;with bed alarm set;with family/visitor present Nurse Communication: Mobility status;Need for lift equipment PT Visit Diagnosis: Unsteadiness on feet (R26.81);Other abnormalities of gait and mobility (R26.89);Hemiplegia and hemiparesis;Pain Hemiplegia - Right/Left: Right Hemiplegia -  dominant/non-dominant: Dominant Hemiplegia - caused by: Cerebral infarction Pain - Right/Left: Right Pain - part of body: Hip     Time: 8493-8454 PT Time Calculation (min) (ACUTE ONLY): 39 min  Charges:    $Gait Training: 23-37 mins $Therapeutic Exercise: 8-22 mins PT General Charges $$ ACUTE PT VISIT: 1 Visit                     Randall SAUNDERS, PT, DPT Acute Rehabilitation Services Office: [phone] Secure Chat Preferred  Delon CHRISTELLA Callander 03/26/2024, 4:46 PM

## 2024-03-26 NOTE — Progress Notes (Signed)
 " PROGRESS NOTE  Isaac Meza FMW:[id number] DOB: December 03, 1970 DOA: 03/23/2024 PCP: [name], DO   LOS: 2 days   Brief Narrative / Interim history: 53 year old male with history of chronic heavy alcoholism, smoking, recently hospitalized for acute CVA a week ago when he had right-sided weakness, slurred speech, ataxic gait.  MRI at that time showed small acute infarct due to small vessel disease, completed stroke workup, discharged with aspirin  Plavix  and Crestor .  He came back due to worsening right-sided weakness.  On this admission MRI showed interval expansion worsening of the recently identified her posterior left basal ganglia infarct, now measuring 2.9 cm in size without hemorrhage or mass effect.  Neurology was consulted and he was admitted to the hospital  Subjective / 24h Interval events: He is doing well this morning, continues to complain of flaccid right arm and leg, slurred speech, not improved but not getting worse  Assesement and Plan: Principal problem Acute CVA -he presented with worsening of pre-existing right-sided deficits, MRI showed interval expansion worsening of the recently identified her posterior left basal ganglia infarct, now measuring 2.9 cm in size without hemorrhage or mass effect.  Neurology consulted, stroke workup was completed last week and was not repeated.  Neurology recommends permissive hypertension for longer period of time.  He is now on dual antiplatelet therapy with aspirin  and Brilinta  for 4 weeks, followed by aspirin  alone.  Continue Crestor  as well - PT recommends CIR, he is stable for discharge, insurance authorization pending  Active problems Essential hypertension-he was on amlodipine  and lisinopril , continue to hold to allow prolonged permissive hypertension  Chronic alcohol use-no withdrawals  Right hip pain -symptomatic management  Scheduled Meds:  acetaminophen   1,000 mg Oral TID   aspirin  EC  81 mg Oral Daily   folic acid   1  mg Oral Daily   multivitamin with minerals  1 tablet Oral Daily   rosuvastatin   20 mg Oral Daily   thiamine   100 mg Oral Daily   ticagrelor   90 mg Oral BID   Continuous Infusions: PRN Meds:.LORazepam  **OR** LORazepam , naLOXone  (NARCAN )  injection, nicotine  polacrilex, oxyCODONE   Current Outpatient Medications  Medication Instructions   amLODipine  (NORVASC ) 5 mg, Oral, Daily   aspirin  EC 81 mg, Oral, Daily, Swallow whole.   clopidogrel  (PLAVIX ) 75 mg, Oral, Daily   lisinopril  (ZESTRIL ) 20 mg, Oral, Daily   naproxen sodium (ALEVE) 440 mg, Daily PRN   rosuvastatin  (CRESTOR ) 20 mg, Oral, Daily    Diet Orders (From admission, onward)     Start     Ordered   03/23/24 2035  Diet regular Room service appropriate? Yes; Fluid consistency: Thin  Diet effective now       Question Answer Comment  Room service appropriate? Yes   Fluid consistency: Thin      03/23/24 2035            DVT prophylaxis: SCDs Start: 03/24/24 1050   Lab Results  Component Value Date   PLT 303 03/23/2024      Code Status: Full Code  Family Communication: No family at bedside  Status is: Inpatient Remains inpatient appropriate because: Awaiting inpatient rehab   Level of care: Telemetry  Consultants:  Neurology  Objective: Vitals:   03/25/24 1122 03/25/24 1423 03/26/24 0729 03/26/24 0734  BP: 110/80 123/83 (!) 145/94   Pulse: 63 81 81   Resp: 16 16 16    Temp: 98.2 F (36.8 C) 98.4 F (36.9 C) 98.7 F (37.1 C)   TempSrc: Oral  Oral Oral   SpO2: 100% 100% 100% 100%    Intake/Output Summary (Last 24 hours) at 03/26/2024 9047 Last data filed at 03/26/2024 0600 Gross per 24 hour  Intake 480 ml  Output 1550 ml  Net -1070 ml   Wt Readings from Last 3 Encounters:  03/17/24 59 kg  11/24/23 57.2 kg  08/29/23 61.2 kg    Examination:  Constitutional: NAD Eyes: no scleral icterus ENMT: Mucous membranes are moist.  Neck: normal, supple Respiratory: clear to auscultation bilaterally,  no wheezing, no crackles.  Cardiovascular: Regular rate and rhythm, no murmurs / rubs / gallops. No LE edema.  Abdomen: non distended, no tenderness. Bowel sounds positive.  Musculoskeletal: no clubbing / cyanosis.  Skin: no rashes Neurologic: Flaccid right side upper and lower extremities, slurred speech, facial droop   Data Reviewed: I have independently reviewed following labs and imaging studies   CBC Recent Labs  Lab 03/23/24 1200  WBC 11.8*  HGB 12.1*  HCT 35.0*  PLT 303  MCV 94.1  MCH 32.5  MCHC 34.6  RDW 12.2  LYMPHSABS 2.3  MONOABS 1.3*  EOSABS 0.0  BASOSABS 0.0    Recent Labs  Lab 03/23/24 1200  NA 136  K 4.1  CL 100  CO2 25  GLUCOSE 125*  BUN 11  CREATININE 0.63  CALCIUM  9.1    ------------------------------------------------------------------------------------------------------------------ No results for input(s): CHOL, HDL, LDLCALC, TRIG, CHOLHDL, LDLDIRECT in the last 72 hours.  Lab Results  Component Value Date   HGBA1C 5.4 11/24/2023   ------------------------------------------------------------------------------------------------------------------ No results for input(s): TSH, T4TOTAL, T3FREE, THYROIDAB in the last 72 hours.  Invalid input(s): FREET3  Cardiac Enzymes No results for input(s): CKMB, TROPONINI, MYOGLOBIN in the last 168 hours.  Invalid input(s): CK ------------------------------------------------------------------------------------------------------------------ No results found for: BNP  CBG: No results for input(s): GLUCAP in the last 168 hours.  No results found for this or any previous visit (from the past 240 hours).   Radiology Studies: No results found.   Nilda Fendt, MD, PhD [facility]  Between 7 am - 7 pm I am available, please contact me via Amion (for emergencies) or Securechat (non urgent messages)  Between 7 pm - 7 am I am not available, please contact  night coverage MD/APP via Amion  "

## 2024-03-26 NOTE — Plan of Care (Signed)
" °  Problem: Activity: Goal: Risk for activity intolerance will decrease Outcome: Progressing   Problem: Safety: Goal: Ability to remain free from injury will improve Outcome: Progressing   Problem: Education: Goal: Knowledge of General Education information will improve Description: Including pain rating scale, medication(s)/side effects and non-pharmacologic comfort measures Outcome: Adequate for Discharge   Problem: Health Behavior/Discharge Planning: Goal: Ability to manage health-related needs will improve Outcome: Adequate for Discharge   Problem: Clinical Measurements: Goal: Ability to maintain clinical measurements within normal limits will improve Outcome: Adequate for Discharge Goal: Will remain free from infection Outcome: Adequate for Discharge Goal: Diagnostic test results will improve Outcome: Adequate for Discharge Goal: Respiratory complications will improve Outcome: Adequate for Discharge Goal: Cardiovascular complication will be avoided Outcome: Adequate for Discharge   Problem: Nutrition: Goal: Adequate nutrition will be maintained Outcome: Adequate for Discharge   Problem: Coping: Goal: Level of anxiety will decrease Outcome: Adequate for Discharge   Problem: Elimination: Goal: Will not experience complications related to bowel motility Outcome: Adequate for Discharge Goal: Will not experience complications related to urinary retention Outcome: Adequate for Discharge   Problem: Pain Managment: Goal: General experience of comfort will improve and/or be controlled Outcome: Adequate for Discharge   Problem: Skin Integrity: Goal: Risk for impaired skin integrity will decrease Outcome: Adequate for Discharge   "

## 2024-03-27 ENCOUNTER — Other Ambulatory Visit (HOSPITAL_COMMUNITY): Payer: Self-pay

## 2024-03-27 DIAGNOSIS — I639 Cerebral infarction, unspecified: Secondary | ICD-10-CM | POA: Diagnosis not present

## 2024-03-27 NOTE — Progress Notes (Signed)
 " PROGRESS NOTE  Isaac Meza FMW:[id number] DOB: 1971-01-29 DOA: 03/23/2024 PCP: [name], DO   LOS: 3 days   Brief Narrative / Interim history: 53 year old male with history of chronic heavy alcoholism, smoking, recently hospitalized for acute CVA a week ago when he had right-sided weakness, slurred speech, ataxic gait.  MRI at that time showed small acute infarct due to small vessel disease, completed stroke workup, discharged with aspirin  Plavix  and Crestor .  He came back due to worsening right-sided weakness.  On this admission MRI showed interval expansion worsening of the recently identified her posterior left basal ganglia infarct, now measuring 2.9 cm in size without hemorrhage or mass effect.  Neurology was consulted and he was admitted to the hospital  Subjective / 24h Interval events: He is doing well, no new complaints this morning.  Still with right-sided weakness.  Wife is at bedside  Assesement and Plan: Principal problem Acute CVA -he presented with worsening of pre-existing right-sided deficits, MRI showed interval expansion worsening of the recently identified her posterior left basal ganglia infarct, now measuring 2.9 cm in size without hemorrhage or mass effect.  Neurology consulted, stroke workup was completed last week and was not repeated.  Neurology recommends permissive hypertension for longer period of time.  He is now on dual antiplatelet therapy with aspirin  and Brilinta  for 4 weeks, followed by aspirin  alone.  Continue Crestor  as well - PT recommends CIR, he is stable for discharge, insurance authorization pending today  Active problems Essential hypertension-he was on amlodipine  and lisinopril , continue to hold to allow prolonged permissive hypertension  Chronic alcohol use-no withdrawals  Right hip pain -symptomatic management  Scheduled Meds:  acetaminophen   1,000 mg Oral TID   aspirin  EC  81 mg Oral Daily   folic acid   1 mg Oral Daily    multivitamin with minerals  1 tablet Oral Daily   rosuvastatin   20 mg Oral Daily   thiamine   100 mg Oral Daily   ticagrelor   90 mg Oral BID   Continuous Infusions: PRN Meds:.LORazepam  **OR** LORazepam , naLOXone  (NARCAN )  injection, nicotine  polacrilex, oxyCODONE   Current Outpatient Medications  Medication Instructions   amLODipine  (NORVASC ) 5 mg, Oral, Daily   aspirin  EC 81 mg, Oral, Daily, Swallow whole.   clopidogrel  (PLAVIX ) 75 mg, Oral, Daily   lisinopril  (ZESTRIL ) 20 mg, Oral, Daily   naproxen sodium (ALEVE) 440 mg, Daily PRN   rosuvastatin  (CRESTOR ) 20 mg, Oral, Daily    Diet Orders (From admission, onward)     Start     Ordered   03/23/24 2035  Diet regular Room service appropriate? Yes; Fluid consistency: Thin  Diet effective now       Question Answer Comment  Room service appropriate? Yes   Fluid consistency: Thin      03/23/24 2035            DVT prophylaxis: SCDs Start: 03/24/24 1050   Lab Results  Component Value Date   PLT 303 03/23/2024      Code Status: Full Code  Family Communication: Wife at bedside  Status is: Inpatient Remains inpatient appropriate because: Awaiting inpatient rehab   Level of care: Telemetry  Consultants:  Neurology  Objective: Vitals:   03/26/24 1952 03/27/24 0000 03/27/24 0421 03/27/24 0742  BP: (!) 136/90 126/73 121/87 (!) 130/95  Pulse: 80 (!) 58 (!) 59 74  Resp: 18 18 18 16   Temp: 99 F (37.2 C) 98.9 F (37.2 C) 98.1 F (36.7 C) 98.4 F (36.9 C)  TempSrc: Oral Oral Oral Oral  SpO2: 100% 99% 100% 99%   No intake or output data in the 24 hours ending 03/27/24 1009  Wt Readings from Last 3 Encounters:  03/17/24 59 kg  11/24/23 57.2 kg  08/29/23 61.2 kg    Examination:  Constitutional: NAD Eyes: lids and conjunctivae normal, no scleral icterus ENMT: mmm Neck: normal, supple Respiratory: clear to auscultation bilaterally, no wheezing, no crackles. Normal respiratory effort.  Cardiovascular: Regular  rate and rhythm, no murmurs / rubs / gallops. No LE edema. Abdomen: soft, no distention, no tenderness. Bowel sounds positive.  Skin: no rashes Neurologic: no focal deficits, equal strength   Data Reviewed: I have independently reviewed following labs and imaging studies   CBC Recent Labs  Lab 03/23/24 1200  WBC 11.8*  HGB 12.1*  HCT 35.0*  PLT 303  MCV 94.1  MCH 32.5  MCHC 34.6  RDW 12.2  LYMPHSABS 2.3  MONOABS 1.3*  EOSABS 0.0  BASOSABS 0.0    Recent Labs  Lab 03/23/24 1200  NA 136  K 4.1  CL 100  CO2 25  GLUCOSE 125*  BUN 11  CREATININE 0.63  CALCIUM  9.1    ------------------------------------------------------------------------------------------------------------------ No results for input(s): CHOL, HDL, LDLCALC, TRIG, CHOLHDL, LDLDIRECT in the last 72 hours.  Lab Results  Component Value Date   HGBA1C 5.4 11/24/2023   ------------------------------------------------------------------------------------------------------------------ No results for input(s): TSH, T4TOTAL, T3FREE, THYROIDAB in the last 72 hours.  Invalid input(s): FREET3  Cardiac Enzymes No results for input(s): CKMB, TROPONINI, MYOGLOBIN in the last 168 hours.  Invalid input(s): CK ------------------------------------------------------------------------------------------------------------------ No results found for: BNP  CBG: No results for input(s): GLUCAP in the last 168 hours.  No results found for this or any previous visit (from the past 240 hours).   Radiology Studies: No results found.   Nilda Fendt, MD, PhD [facility]  Between 7 am - 7 pm I am available, please contact me via Amion (for emergencies) or Securechat (non urgent messages)  Between 7 pm - 7 am I am not available, please contact night coverage MD/APP via Amion  "

## 2024-03-27 NOTE — Plan of Care (Signed)

## 2024-03-27 NOTE — Progress Notes (Signed)
 Physical Therapy Treatment Patient Details Name: Isaac Meza MRN: [id number] DOB: March 30, 1971 Today's Date: 03/27/2024   History of Present Illness Isaac Meza is a 53 y.o. M who presented to [facility] on 03/23/24 with R side weakness, falls, and difficulty swallowing. MRI + for evolution of recent L corona radiata infarcts. PMHx: HTN, heavy ETOH use, and asthma.    PT Comments  Today's session focused on RLE quad control/knee stability, improved standing tolerance, and increased static standing balance. Pt engaged in blocked practice of sit<>stand using stedy followed by TKEs with 5 second hold without resistance inside stedy and then seated rest. Pt completed 5 sets of 5 reps and required minA. As he fatigued, pt's ability to achieve knee extension and maintain positioning lessened requiring TC and light assist from PT. Instructed pt in quad sets while supine in bed. He was able to achieve a mild contracture within the RLE. Encouraged him to complete 3x10 throughout the day while simultaneously performing the same exercise on his LLE. Patient will benefit from intensive inpatient follow-up therapy, >3 hours/day.   If plan is discharge home, recommend the following: A lot of help with walking and/or transfers;A little help with bathing/dressing/bathroom;Assistance with cooking/housework;Assist for transportation;Help with stairs or ramp for entrance   Can travel by private Theme Park Manager cushion (measurements PT);Wheelchair (measurements PT);Rolling walker (2 wheels);BSC/3in1    Recommendations for Other Services       Precautions / Restrictions Precautions Precautions: Fall Recall of Precautions/Restrictions: Impaired Restrictions Weight Bearing Restrictions Per Provider Order: No     Mobility  Bed Mobility Overal bed mobility: Needs Assistance Bed Mobility: Supine to Sit, Sit to Supine     Supine to sit: Min assist, HOB elevated Sit to  supine: Min assist, HOB elevated   General bed mobility comments: Assist to manage RUE/RLE. Pt able to pull on bed pad to elevate trunk and use bed rail to scoot hips fwd til feet flat. Assist to bring RLE back into bed. Repositioned so RUE was elevated on pillow.    Transfers Overall transfer level: Needs assistance Equipment used: Ambulation equipment used Transfers: Sit to/from Stand Sit to Stand: Min assist, Via lift equipment           General transfer comment: Pt stood using stedy. He completed 5x5 sit<>stands. PT assisted in placing RUE on crossbar and maintained grip. Pt pulled up with LUE on crossbar. Noted a bias to the left side when powering up. Staggered pt's feet so RLE was closer to the bed. Cued pt to increase fwd lean and come up in the middle or slightly biased to the right. Seated rest breaks between sets. Good eccentric control. PT aiding in bring RUE back to bed. Transfer via Lift Equipment: Stedy  Ambulation/Gait                   Stairs             Wheelchair Mobility     Tilt Bed    Modified Rankin (Stroke Patients Only) Modified Rankin (Stroke Patients Only) Pre-Morbid Rankin Score: No symptoms Modified Rankin: Moderately severe disability     Balance Overall balance assessment: Needs assistance Sitting-balance support: Single extremity supported, Feet supported Sitting balance-Leahy Scale: Good     Standing balance support: Bilateral upper extremity supported, During functional activity, Reliant on assistive device for balance Standing balance-Leahy Scale: Poor Standing balance comment: Pt stood using stedy. He demonstrated a bias towards  the L able to correct to netural with mutli-modal cues.                            Communication Communication Communication: Impaired Factors Affecting Communication: Reduced clarity of speech  Cognition Arousal: Alert Behavior During Therapy: WFL for tasks assessed/performed,  Flat affect   PT - Cognitive impairments: Sequencing, Problem solving, Safety/Judgement, Awareness                       PT - Cognition Comments: Pt with some R inattention. Requires cues to move his RUE out of the way so he doesn't roll onto it or let it drop. Following commands: Intact      Cueing Cueing Techniques: Verbal cues, Gestural cues, Tactile cues  Exercises General Exercises - Lower Extremity Quad Sets: Supine, Right, AAROM, Left, AROM, 10 reps (with 3-5 second hold. Pt with min-mod palpable correction felt in RLE.) Other Exercises Other Exercises: Standing Terminal Knee Extension inside stedy: 5x5 with 5 second hold. PT provided tactile stimulation/cueing. As pt fatigued, he was unable to maintain extension.    General Comments        Pertinent Vitals/Pain Pain Assessment Pain Assessment: Faces Faces Pain Scale: Hurts a little bit Pain Location: R lower back, R hip, med/lat R thigh Pain Descriptors / Indicators: Discomfort, Aching, Sore Pain Intervention(s): Monitored during session    Home Living                          Prior Function            PT Goals (current goals can now be found in the care plan section) Acute Rehab PT Goals Patient Stated Goal: Go to IPR PT Goal Formulation: With patient/family Time For Goal Achievement: 04/07/24 Potential to Achieve Goals: Fair Progress towards PT goals: Progressing toward goals    Frequency    Min 3X/week      PT Plan      Co-evaluation              AM-PAC PT 6 Clicks Mobility   Outcome Measure  Help needed turning from your back to your side while in a flat bed without using bedrails?: A Little Help needed moving from lying on your back to sitting on the side of a flat bed without using bedrails?: A Little Help needed moving to and from a bed to a chair (including a wheelchair)?: A Lot Help needed standing up from a chair using your arms (e.g., wheelchair or bedside  chair)?: A Little Help needed to walk in hospital room?: Total Help needed climbing 3-5 steps with a railing? : Total 6 Click Score: 13    End of Session Equipment Utilized During Treatment: Gait belt Activity Tolerance: Patient tolerated treatment well Patient left: in bed;with call bell/phone within reach;with bed alarm set Nurse Communication: Mobility status;Need for lift equipment PT Visit Diagnosis: Unsteadiness on feet (R26.81);Other abnormalities of gait and mobility (R26.89);Hemiplegia and hemiparesis;Pain Hemiplegia - Right/Left: Right Hemiplegia - dominant/non-dominant: Dominant Hemiplegia - caused by: Cerebral infarction Pain - Right/Left: Right Pain - part of body: Hip     Time: 8394-8361 PT Time Calculation (min) (ACUTE ONLY): 33 min  Charges:    $Therapeutic Exercise: 8-22 mins $Therapeutic Activity: 8-22 mins PT General Charges $$ ACUTE PT VISIT: 1 Visit  Randall SAUNDERS, PT, DPT Acute Rehabilitation Services Office: [phone] Secure Chat Preferred  Delon CHRISTELLA Callander 03/27/2024, 5:11 PM

## 2024-03-28 ENCOUNTER — Encounter (HOSPITAL_COMMUNITY): Payer: Self-pay | Admitting: Physical Medicine and Rehabilitation

## 2024-03-28 ENCOUNTER — Other Ambulatory Visit: Payer: Self-pay

## 2024-03-28 ENCOUNTER — Inpatient Hospital Stay (HOSPITAL_COMMUNITY)
Admission: AD | Admit: 2024-03-28 | DRG: 057 | Source: Intra-hospital | Attending: Physical Medicine and Rehabilitation | Admitting: Physical Medicine and Rehabilitation

## 2024-03-28 DIAGNOSIS — G8191 Hemiplegia, unspecified affecting right dominant side: Secondary | ICD-10-CM

## 2024-03-28 DIAGNOSIS — I69393 Ataxia following cerebral infarction: Secondary | ICD-10-CM | POA: Diagnosis not present

## 2024-03-28 DIAGNOSIS — R159 Full incontinence of feces: Secondary | ICD-10-CM | POA: Diagnosis not present

## 2024-03-28 DIAGNOSIS — E785 Hyperlipidemia, unspecified: Secondary | ICD-10-CM | POA: Diagnosis present

## 2024-03-28 DIAGNOSIS — I69351 Hemiplegia and hemiparesis following cerebral infarction affecting right dominant side: Principal | ICD-10-CM

## 2024-03-28 DIAGNOSIS — I69320 Aphasia following cerebral infarction: Secondary | ICD-10-CM | POA: Diagnosis not present

## 2024-03-28 DIAGNOSIS — Z9049 Acquired absence of other specified parts of digestive tract: Secondary | ICD-10-CM

## 2024-03-28 DIAGNOSIS — Z7982 Long term (current) use of aspirin: Secondary | ICD-10-CM

## 2024-03-28 DIAGNOSIS — M25551 Pain in right hip: Secondary | ICD-10-CM | POA: Diagnosis present

## 2024-03-28 DIAGNOSIS — R531 Weakness: Secondary | ICD-10-CM | POA: Diagnosis present

## 2024-03-28 DIAGNOSIS — F17210 Nicotine dependence, cigarettes, uncomplicated: Secondary | ICD-10-CM | POA: Diagnosis present

## 2024-03-28 DIAGNOSIS — I69322 Dysarthria following cerebral infarction: Secondary | ICD-10-CM | POA: Diagnosis not present

## 2024-03-28 DIAGNOSIS — D72829 Elevated white blood cell count, unspecified: Secondary | ICD-10-CM | POA: Diagnosis not present

## 2024-03-28 DIAGNOSIS — D75839 Thrombocytosis, unspecified: Secondary | ICD-10-CM | POA: Diagnosis present

## 2024-03-28 DIAGNOSIS — Z79899 Other long term (current) drug therapy: Secondary | ICD-10-CM

## 2024-03-28 DIAGNOSIS — R058 Other specified cough: Secondary | ICD-10-CM | POA: Diagnosis not present

## 2024-03-28 DIAGNOSIS — R918 Other nonspecific abnormal finding of lung field: Secondary | ICD-10-CM | POA: Diagnosis not present

## 2024-03-28 DIAGNOSIS — M79604 Pain in right leg: Secondary | ICD-10-CM | POA: Diagnosis not present

## 2024-03-28 DIAGNOSIS — E782 Mixed hyperlipidemia: Secondary | ICD-10-CM

## 2024-03-28 DIAGNOSIS — Z7902 Long term (current) use of antithrombotics/antiplatelets: Secondary | ICD-10-CM | POA: Diagnosis not present

## 2024-03-28 DIAGNOSIS — D649 Anemia, unspecified: Secondary | ICD-10-CM | POA: Diagnosis present

## 2024-03-28 DIAGNOSIS — I10 Essential (primary) hypertension: Secondary | ICD-10-CM | POA: Diagnosis present

## 2024-03-28 DIAGNOSIS — R471 Dysarthria and anarthria: Secondary | ICD-10-CM

## 2024-03-28 DIAGNOSIS — I639 Cerebral infarction, unspecified: Secondary | ICD-10-CM | POA: Diagnosis not present

## 2024-03-28 MED ORDER — NICOTINE POLACRILEX 2 MG MT GUM: 2.0000 mg | CHEWING_GUM | OROMUCOSAL | Status: DC | PRN

## 2024-03-28 MED ORDER — ROSUVASTATIN CALCIUM 20 MG PO TABS
20.0000 mg | ORAL_TABLET | Freq: Every day | ORAL | Status: DC
  Administered 2024-03-29 – 2024-04-12 (×15): 20 mg via ORAL
  Filled 2024-03-28 (×8): qty 1

## 2024-03-28 MED ORDER — THIAMINE MONONITRATE 100 MG PO TABS
100.0000 mg | ORAL_TABLET | Freq: Every day | ORAL | Status: DC
  Administered 2024-03-29 – 2024-04-12 (×15): 100 mg via ORAL
  Filled 2024-03-28 (×8): qty 1

## 2024-03-28 MED ORDER — ADULT MULTIVITAMIN W/MINERALS CH
1.0000 | ORAL_TABLET | Freq: Every day | ORAL | Status: DC
  Administered 2024-03-29 – 2024-04-12 (×15): 1 via ORAL
  Filled 2024-03-28 (×8): qty 1

## 2024-03-28 MED ORDER — OXYCODONE HCL 5 MG PO TABS
5.0000 mg | ORAL_TABLET | Freq: Four times a day (QID) | ORAL | Status: DC | PRN
  Administered 2024-03-29 – 2024-03-30 (×2): 5 mg via ORAL
  Filled 2024-03-28 (×2): qty 1

## 2024-03-28 MED ORDER — TICAGRELOR 90 MG PO TABS
90.0000 mg | ORAL_TABLET | Freq: Two times a day (BID) | ORAL | Status: DC
  Administered 2024-03-28 – 2024-04-12 (×30): 90 mg via ORAL
  Filled 2024-03-28 (×16): qty 1

## 2024-03-28 MED ORDER — ASPIRIN 81 MG PO TBEC
81.0000 mg | DELAYED_RELEASE_TABLET | Freq: Every day | ORAL | Status: DC
  Administered 2024-03-29 – 2024-04-12 (×15): 81 mg via ORAL
  Filled 2024-03-28 (×8): qty 1

## 2024-03-28 MED ORDER — FOLIC ACID 1 MG PO TABS
1.0000 mg | ORAL_TABLET | Freq: Every day | ORAL | Status: DC
  Administered 2024-03-29 – 2024-04-12 (×15): 1 mg via ORAL
  Filled 2024-03-28 (×8): qty 1

## 2024-03-28 MED ORDER — TICAGRELOR 90 MG PO TABS: 90.0000 mg | ORAL_TABLET | Freq: Two times a day (BID) | ORAL | Status: DC

## 2024-03-28 NOTE — Progress Notes (Signed)
 Inpatient Rehabilitation Admission Medication Review by a Pharmacist  A complete drug regimen review was completed for this patient to identify any potential clinically significant medication issues.  High Risk Drug Classes Is patient taking? Indication by Medication  Antipsychotic No   Anticoagulant No   Antibiotic No   Opioid Yes Oxycodone  for acute pain   Antiplatelet Yes Aspirin  and ticagrelor  for CVA  Hypoglycemics/insulin No   Vasoactive Medication No   Chemotherapy No   Other Yes Rosuvastatin  for HLD     Type of Medication Issue Identified Description of Issue Recommendation(s)  Drug Interaction(s) (clinically significant)     Duplicate Therapy     Allergy     No Medication Administration End Date  Ticagrelor  for 4 weeks not entered Added end date 1/16  Incorrect Dose     Additional Drug Therapy Needed     Significant med changes from prior encounter (inform family/care partners about these prior to discharge). HELD home amlodipine , clopidogrel , naproxen F/u restart amlodipine  when out of permissive HTN range.  Review at discharge   Other       Clinically significant medication issues were identified that warrant physician communication and completion of prescribed/recommended actions by midnight of the next day:  No  Name of provider notified for urgent issues identified:   Provider Method of Notification:     Pharmacist comments:   Time spent performing this drug regimen review (minutes):  10  Jinnie Door, PharmD, BCPS, [facility] Clinical Pharmacist  Please check AMION for all [facility] phone numbers After 10:00 PM, call Main Pharmacy [phone]

## 2024-03-28 NOTE — Progress Notes (Signed)
 "  Signed      Expand All Collapse All PMR Admission Coordinator Pre-Admission Assessment   Patient: Isaac Meza is an 53 y.o., male MRN: [id number] DOB: 01/15/1971 Height:   Weight:     Insurance Information HMO:     PPO:      PCP:      IPA:      80/20:      OTHER:  PRIMARY: [place] Medicaid Healthy Blue      Policy#: [id number]      Subscriber: pt CM Name: TBD      Phone#: TBD     Fax#: [phone]/concurrent review [phone] Pre-Cert#: [id number]  Received authorization on 03/28/24 for admit 03/28/24 with next review date 12/31.  Updates due to fax listed above.        Employer:  Benefits:  Phone #: [phone]     Name: 12/19 Eff. Date: 03/07/22 until 03/06/2025     Deduct: none      Out of Pocket Max: none      Life Max: none CIR: 100% per medicaid guidelines      SNF: per MCD  Outpatient: $ 4 per visit     Co-Pay:  Home Health: per MCD      Co-Pay:  DME: per MCD     Co-Pay:  Providers: in network  SECONDARY: none      Policy#:      Phone#:    Artist:       Phone#:    The Best Boy for patients in Inpatient Rehabilitation Facilities with attached Privacy Act Statement-Health Care Records was provided and verbally reviewed with: Patient and Family   Emergency Contact Information Contact Information       Name Relation Home Work Mobile    Hyser,Dawn Spouse [phone]   [phone]         Other Contacts   None on File      Current Medical History  Patient Admitting Diagnosis: CVA   History of Present Illness: 53 yo male with history of asthma, HTN< alcohol use disorder and recent admit 12/11 until 03/19/24 for acute CVA. He was started on ASA, Plavix  and Crestor  and discharged home with [facility]. Due to worsening symptoms after home, presented on 12/18. Slurred speech and ataxic gait.   He was not a candidate for thrombolytic therapy. MRI shows a fairly large greater than 2 cm left subcortical infarct which has increased  in size compared to last admits MRI.Etiology felt to be expansion of previous CVA. 2 d echo EF 60 to 65%, no atrial shunt. ON ASA and clopidogrel , now placed on ASA and Brilinta  for 4 weeks and then ASA alone. Home meds of amlodipine  and lisinopril  for HTN.    Rosuvastatin  resumed for LDL is 45. Advised to not smoke or drink. Post stroke dysphagia and SLP consulted.      Complete NIHSS TOTAL: 12   Patient's medical record from [facility] has been reviewed by the rehabilitation admission coordinator and physician.   Past Medical History      Past Medical History:  Diagnosis Date   Asthma      childhood   Hypertension          Has the patient had major surgery during 100 days prior to admission? No   Family History   family history is not on file.   Current Medications [Current Medications]  [Current Medications]    Current Facility-Administered Medications:    acetaminophen  (TYLENOL ) tablet  1,000 mg, 1,000 mg, Oral, TID, Dahal, Binaya, MD, 1,000 mg at 03/27/24 2117   aspirin  EC tablet 81 mg, 81 mg, Oral, Daily, Randol Simmonds, MD, 81 mg at 03/28/24 0825   folic acid  (FOLVITE ) tablet 1 mg, 1 mg, Oral, Daily, Dahal, Binaya, MD, 1 mg at 03/28/24 9175   multivitamin with minerals tablet 1 tablet, 1 tablet, Oral, Daily, Dahal, Binaya, MD, 1 tablet at 03/28/24 9175   naloxone  (NARCAN ) injection 0.4 mg, 0.4 mg, Intravenous, PRN, Howerter, Justin B, DO   nicotine  polacrilex (NICORETTE ) gum 2 mg, 2 mg, Oral, PRN, de Clint Kill, Cortney E, NP   oxyCODONE  (Oxy IR/ROXICODONE ) immediate release tablet 5 mg, 5 mg, Oral, Q6H PRN, Dahal, Binaya, MD, 5 mg at 03/27/24 9081   rosuvastatin  (CRESTOR ) tablet 20 mg, 20 mg, Oral, Daily, Randol Simmonds, MD, 20 mg at 03/28/24 9175   thiamine  (VITAMIN B1) tablet 100 mg, 100 mg, Oral, Daily, 100 mg at 03/28/24 0824 **OR** [DISCONTINUED] thiamine  (VITAMIN B1) injection 100 mg, 100 mg, Intravenous, Daily, Dahal, Binaya, MD   ticagrelor  (BRILINTA ) tablet 90  mg, 90 mg, Oral, BID, de Clint Kill, [place] E, NP, 90 mg at 03/28/24 9175    Patients Current Diet:  Diet Order                  Diet regular Room service appropriate? Yes; Fluid consistency: Thin  Diet effective now                       Precautions / Restrictions Precautions Precautions: Fall Precaution/Restrictions Comments: impulsive Restrictions Weight Bearing Restrictions Per Provider Order: No    Has the patient had 2 or more falls or a fall with injury in the past year? Yes since d/c home after initial CVA   Prior Activity Level Community (5-7x/wk): independent working 2 jobs   Prior Functional Level Self Care: Did the patient need help bathing, dressing, using the toilet or eating? Independent   Indoor Mobility: Did the patient need assistance with walking from room to room (with or without device)? Independent   Stairs: Did the patient need assistance with internal or external stairs (with or without device)? Independent   Functional Cognition: Did the patient need help planning regular tasks such as shopping or remembering to take medications? Independent   Patient Information Are you of Hispanic, Latino/a,or Spanish origin?: A. No, not of Hispanic, Latino/a, or Spanish origin What is your race?: A. White Do you need or want an interpreter to communicate with a doctor or health care staff?: 0. No   Patient's Response To:  Health Literacy and Transportation Is the patient able to respond to health literacy and transportation needs?: Yes Health Literacy - How often do you need to have someone help you when you read instructions, pamphlets, or other written material from your doctor or pharmacy?: Never In the past 12 months, has lack of transportation kept you from medical appointments or from getting medications?: No In the past 12 months, has lack of transportation kept you from meetings, work, or from getting things needed for daily living?: No   Home  Assistive Devices / Equipment Home Equipment: None   Prior Device Use: Indicate devices/aids used by the patient prior to current illness, exacerbation or injury? None of the above   Current Functional Level Cognition   Arousal/Alertness: Awake/alert Overall Cognitive Status: Impaired/Different from baseline Orientation Level: Oriented X4    Extremity Assessment (includes Sensation/Coordination)   Upper Extremity Assessment:  Defer to OT evaluation RUE Deficits / Details: Full PROM, no voluntary movement RUE Sensation: decreased light touch, decreased proprioception RUE Coordination: decreased fine motor, decreased gross motor  Lower Extremity Assessment: RLE deficits/detail RLE Deficits / Details: Pt has 2/5 quad contraction in standing, unable to activate in non-WB. Pt with 2/5 HS strength for knee flexion, 0/5 noted in the ankle DF/PF, 1/5 hip abd     ADLs   Overall ADL's : Needs assistance/impaired Eating/Feeding: Minimal assistance, Sitting, Bed level Grooming: Set up, Sitting, Bed level Upper Body Bathing: Moderate assistance, Sitting Lower Body Bathing: Sitting/lateral leans, Maximal assistance Upper Body Dressing : Moderate assistance, Sitting Lower Body Dressing: Total assistance, Bed level     Mobility   Overal bed mobility: Needs Assistance Bed Mobility: Supine to Sit, Sit to Supine Supine to sit: Min assist, HOB elevated Sit to supine: Min assist, HOB elevated General bed mobility comments: Assist to manage RUE/RLE. Pt able to pull on bed pad to elevate trunk and use bed rail to scoot hips fwd til feet flat. Assist to bring RLE back into bed. Repositioned so RUE was elevated on pillow.     Transfers   Overall transfer level: Needs assistance Equipment used: Ambulation equipment used Transfers: Sit to/from Stand Sit to Stand: Min assist, Via lift equipment Bed to/from chair/wheelchair/BSC transfer type:: Via Financial Planner via Lift Equipment:  [employer] transfer comment: Pt stood using stedy. He completed 5x5 sit<>stands. PT assisted in placing RUE on crossbar and maintained grip. Pt pulled up with LUE on crossbar. Noted a bias to the left side when powering up. Staggered pt's feet so RLE was closer to the bed. Cued pt to increase fwd lean and come up in the middle or slightly biased to the right. Seated rest breaks between sets. Good eccentric control. PT aiding in bring RUE back to bed.     Ambulation / Gait / Stairs / Wheelchair Mobility   Ambulation/Gait Ambulation/Gait assistance: Mod assist, +2 safety/equipment, Max assist (close chair follow) Gait Distance (Feet): 10 Feet (1x10, prolonged seated rest, 1x5, seated rest, 1x5) Assistive device: Rolling walker (2 wheels) Gait Pattern/deviations: Step-to pattern, Decreased stride length, Decreased stance time - right, Decreased weight shift to right, Decreased dorsiflexion - right, Ataxic, Knee flexed in stance - right, Knees buckling General Gait Details: Pt took short slow steps. He had difficulty placing RLE with ankle inverting. Increased time and multi-modal cues to correct RLE position. Pt with limited R quad control. He had difficulty turning, taking multiple short steps. Pt's R knee buckled and he had a sudden significant R lateral lean requiring maxA to correct. PT quickly faciliated pt to rest in standard chair. Discussed the importance of taking seated rest break early. As pt fatigued, he lacked R foot clearence and required physical assist to adjust RLE. Gait velocity: decr Pre-gait activities: Pt engaged in weight shifts and standing marches inside stedy with minA for support.     Posture / Balance Balance Overall balance assessment: Needs assistance Sitting-balance support: Single extremity supported, Feet supported Sitting balance-Leahy Scale: Good Postural control: Right lateral lean Standing balance support: Bilateral upper extremity supported, During functional  activity, Reliant on assistive device for balance Standing balance-Leahy Scale: Poor Standing balance comment: Pt stood using stedy. He demonstrated a bias towards the L able to correct to netural with mutli-modal cues.     Special considerations/life events  Smoker and ETOH use disorder Fall precautions    Previous Home Environment  Living Arrangements:  (  spouse , mother and father in law live with couple)  Lives With: Spouse, Family Available Help at Discharge:  (wife works days and in medical illustrator when wife works) Type of Home: [employer] Layout: Two level, Able to live on main level with bedroom/bathroom Home Access: Stairs to enter Entrance Stairs-Rails: None Secretary/administrator of Steps: 2 Bathroom Shower/Tub: Engineer, Manufacturing Systems: Standard Bathroom Accessibility: Yes How Accessible: Accessible via walker Home Care Services: No   Discharge Living Setting Plans for Discharge Living Setting: Patient's home, House, Lives with (comment) (wife and in laws) Type of Home at Discharge: House Discharge Home Layout: Two level, Able to live on main level with bedroom/bathroom Alternate Level Stairs-Rails: None Discharge Home Access: Stairs to enter Entrance Stairs-Rails: None Entrance Stairs-Number of Steps: 2 Discharge Bathroom Shower/Tub: Tub/shower unit Discharge Bathroom Toilet: Standard Discharge Bathroom Accessibility: Yes How Accessible: Accessible via walker Does the patient have any problems obtaining your medications?: No   Social/Family/Support Systems Patient Roles: Spouse Contact Information: wife, Dawn Anticipated Caregiver: wife and in laws when wife works Anticipated Industrial/product Designer Information: see contacts Ability/Limitations of Caregiver: wife works day shift. in laws able and can asisst Caregiver Availability: 24/7 Discharge Plan Discussed with Primary Caregiver: Yes Is Caregiver In Agreement with Plan?: Yes Does Caregiver/Family have  Issues with Lodging/Transportation while Pt is in Rehab?: No   Goals Patient/Family Goal for Rehab: supervision with PT, OT and SLP Expected length of stay: ELOS 10 to 14 days Pt/Family Agrees to Admission and willing to participate: Yes Program Orientation Provided & Reviewed with Pt/Caregiver Including Roles  & Responsibilities: Yes   Decrease burden of Care through IP rehab admission: n/a   Possible need for SNF placement upon discharge: not anticipated   Patient Condition: I have reviewed medical records from [facility], spoken with CM, and patient and spouse. I met with patient at the bedside and discussed via phone for inpatient rehabilitation assessment.  Patient will benefit from ongoing PT, OT, and SLP, can actively participate in 3 hours of therapy a day 5 days of the week, and can make measurable gains during the admission.  Patient will also benefit from the coordinated team approach during an Inpatient Acute Rehabilitation admission.  The patient will receive intensive therapy as well as Rehabilitation physician, nursing, social worker, and care management interventions.  Due to bladder management, bowel management, safety, skin/wound care, disease management, medication administration, pain management, and patient education the patient requires 24 hour a day rehabilitation nursing.  The patient is currently Min A with mobility and Mod-Total A with basic ADLs.  Discharge setting and therapy post discharge at home with home health is anticipated.  Patient has agreed to participate in the Acute Inpatient Rehabilitation Program and will admit today.   Preadmission Screen Completed By:  Alison Heron Lot, RN MSN 03/28/2024 9:55 AM ______________________________________________________________________   Discussed status with Dr. Carilyn on 03/28/2024  at 9:55 AM and received approval for admission today.   Admission Coordinator:  Heron Lot Alison, RN MSN time 9:55  AM/Date 03/28/2024     Assessment/Plan: Diagnosis: Left subcortical infarct Does the need for close, 24 hr/day Medical supervision in concert with the patient's rehab needs make it unreasonable for this patient to be served in a less intensive setting? Yes Co-Morbidities requiring supervision/potential complications: Alcoholism, hypertension  Due to bladder management, bowel management, safety, skin/wound care, disease management, medication administration, pain management, and patient education, does the patient require 24 hr/day rehab nursing? Yes Does  the patient require coordinated care of a physician, rehab nurse, PT, OT, and SLP to address physical and functional deficits in the context of the above medical diagnosis(es)? Yes Addressing deficits in the following areas: balance, endurance, locomotion, strength, transferring, bowel/bladder control, bathing, dressing, toileting, speech, and swallowing Can the patient actively participate in an intensive therapy program of at least 3 hrs of therapy 5 days a week? Yes The potential for patient to make measurable gains while on inpatient rehab is good Anticipated functional outcomes upon discharge from inpatient rehab: supervision PT, supervision OT, supervision SLP Estimated rehab length of stay to reach the above functional goals is: 10-14d Anticipated discharge destination: Home 10. Overall Rehab/Functional Prognosis: good     MD Signature: Prentice [name] Compton M.D. [facility] Fellow Am Acad of Phys Med and Rehab Diplomate Am Board of Electrodiagnostic Med Fellow Am Board of Interventional Pain               Revision History  Date/Time User Provider Type Action  03/28/2024 10:44 AM Kirsteins, Prentice BRAVO, MD Physician Sign  03/28/2024 10:34 AM Yvone Delayne Tinnie SHAUNNA, CCC-SLP Rehab Admission Coordinator Share  03/28/2024  9:55 AM Yvone Delayne Tinnie SHAUNNA, CCC-SLP Rehab Admission Coordinator Share  03/25/2024  4:06  PM Alison Heron MATSU, RN Rehab Admission Coordinator Share  03/25/2024  4:02 PM Alison Heron MATSU, RN Rehab Admission Coordinator Share   "

## 2024-03-28 NOTE — TOC Transition Note (Signed)
 Transition of Care [facility]) - Discharge Note   Patient Details  Name: Isaac Meza MRN: [id number] Date of Birth: [birth year]  [facility] CM/SW Contact:  Andrez JULIANNA George, RN Phone Number: 03/28/2024, 11:22 AM   Clinical Narrative:     Pt is discharging to [facility] today. CM signing off.    Final next level of care: IP Rehab Facility Barriers to Discharge: No Barriers Identified   Patient Goals and CMS Choice   CMS Medicare.gov Compare Post Acute Care list provided to:: Patient Choice offered to / list presented to : Patient      Discharge Placement                       Discharge Plan and Services Additional resources added to the After Visit Summary for                                       Social Drivers of Health (SDOH) Interventions SDOH Screenings   Food Insecurity: No Food Insecurity (03/25/2024)  Housing: Low Risk (03/25/2024)  Transportation Needs: No Transportation Needs (03/25/2024)  Utilities: Not At Risk (03/25/2024)  Depression (PHQ2-9): Low Risk (05/21/2023)  Tobacco Use: High Risk (03/23/2024)     Readmission Risk Interventions     No data to display

## 2024-03-28 NOTE — Discharge Summary (Signed)
 "  Physician Discharge Summary  Isaac Meza FMW:[id number] DOB: 1970-04-16 DOA: 03/23/2024  PCP: [name], DO  Admit date: 03/23/2024 Discharge date: 03/28/2024  Admitted From: home Disposition:  CIR  Recommendations for Outpatient Follow-up:  Follow up with PCP in 1-2 weeks Please obtain BMP/CBC in one week  Home Health: none Equipment/Devices: none  Discharge Condition: stable CODE STATUS: Full code Diet Orders (From admission, onward)     Start     Ordered   03/23/24 2035  Diet regular Room service appropriate? Yes; Fluid consistency: Thin  Diet effective now       Question Answer Comment  Room service appropriate? Yes   Fluid consistency: Thin      03/23/24 2035            Brief Narrative / Interim history: 53 year old male with history of chronic heavy alcoholism, smoking, recently hospitalized for acute CVA a week ago when he had right-sided weakness, slurred speech, ataxic gait.  MRI at that time showed small acute infarct due to small vessel disease, completed stroke workup, discharged with aspirin  Plavix  and Crestor .  He came back due to worsening right-sided weakness.  On this admission MRI showed interval expansion worsening of the recently identified her posterior left basal ganglia infarct, now measuring 2.9 cm in size without hemorrhage or mass effect.  Neurology was consulted and he was admitted to the hospital  Hospital Course / Discharge diagnoses: Principal problem Acute CVA -he presented with worsening of pre-existing right-sided deficits, MRI showed interval expansion worsening of the recently identified her posterior left basal ganglia infarct, now measuring 2.9 cm in size without hemorrhage or mass effect.  Neurology consulted, stroke workup was completed last week and was not repeated.  Neurology recommends permissive hypertension for longer period of time.  He is now on dual antiplatelet therapy with aspirin  and Brilinta  for 4 weeks,  followed by aspirin  alone.  Continue Crestor  as well.  He remained stable, will be discharged to inpatient rehab   Active problems Essential hypertension-he was on amlodipine  and lisinopril , continue to hold to allow prolonged permissive hypertension.  Gradually resume antihypertensives over the next week or 2 based on his BP Chronic alcohol use-no withdrawals Right hip pain -symptomatic management  Sepsis ruled out   Discharge Instructions   Allergies as of 03/28/2024   No Known Allergies      Medication List     STOP taking these medications    amLODipine  5 MG tablet Commonly known as: NORVASC    clopidogrel  75 MG tablet Commonly known as: PLAVIX    lisinopril  20 MG tablet Commonly known as: ZESTRIL        TAKE these medications    aspirin  EC 81 MG tablet Take 1 tablet (81 mg total) by mouth daily. Swallow whole.   naproxen sodium 220 MG tablet Commonly known as: ALEVE Take 440 mg by mouth daily as needed (pain).   rosuvastatin  20 MG tablet Commonly known as: CRESTOR  Take 1 tablet (20 mg total) by mouth daily.   ticagrelor  90 MG Tabs tablet Commonly known as: BRILINTA  Take 1 tablet (90 mg total) by mouth 2 (two) times daily.       Consultations: Neurology   Procedures/Studies:  CT Head Wo Contrast Result Date: 03/24/2024 EXAM: CT HEAD WITHOUT CONTRAST 03/24/2024 09:09:00 AM TECHNIQUE: CT of the head was performed without the administration of intravenous contrast. Automated exposure control, iterative reconstruction, and/or weight based adjustment of the mA/kV was utilized to reduce the radiation dose to as low  as reasonably achievable. 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 FINDINGS: BRAIN AND VENTRICLES: No acute hemorrhage. Evolving infarct in left corona radiata. Acute infarct in right thalamus better seen on prior brain MRI. No  hydrocephalus. No extra-axial collection. No mass effect or midline shift. ORBITS: No acute abnormality. SINUSES: Complete opacification of left maxillary sinus. SOFT TISSUES AND SKULL: No acute soft tissue abnormality. No skull fracture. IMPRESSION: 1. Evolving infarct in left corona radiata. 2. Acute infarct in right thalamus, better seen on prior brain MRI. 3. Complete opacification of left maxillary sinus. Electronically signed by: Evalene Coho MD 03/24/2024 10:02 AM EST RP Workstation: [id number]   MR BRAIN WO CONTRAST Result Date: 03/23/2024 CLINICAL DATA:  Initial evaluation for acute increase in weakness, recent stroke. EXAM: MRI HEAD WITHOUT CONTRAST TECHNIQUE: Multiplanar, multiecho pulse sequences of the brain and surrounding structures were obtained without intravenous contrast. COMPARISON:  CT from earlier the same day as well as recent MRI from 03/17/2024. FINDINGS: Brain: Examination mildly degraded by motion artifact. Cerebral volume within normal limits. Patchy T2/FLAIR hyperintensity involving the supratentorial cerebral white matter, most characteristic of chronic microvascular ischemic disease, mild in nature. Remote lacunar infarcts present about the bilateral basal ganglia and thalami. There has been interval expansion and worsening in previously identified posterior left basal ganglia infarct, now measuring up to 2.9 cm in size (series 2, image 32). No associated hemorrhage or significant regional mass effect. No other areas of new or interval ischemia elsewhere. Gray-white matter differentiation otherwise maintained. No other acute or chronic intracranial blood products. No mass lesion, midline shift or mass effect. No hydrocephalus or extra-axial fluid collection. Pituitary gland within normal limits. Vascular: Major intracranial vascular flow voids are maintained. Skull and upper cervical spine: Cranial junction within normal limits. Exaggeration of the normal cervical lordosis  noted. No scalp soft tissue abnormality. Sinuses/Orbits: Globes orbital soft tissues within normal limits. Chronic left ethmoidal and left maxillary sinusitis, left OM U obstructive pattern. Trace right mastoid effusion, of doubtful significance. Other: None. IMPRESSION: 1. Interval expansion and worsening in recently identified posterior left basal ganglia infarct, now measuring up to 2.9 cm in size. No associated hemorrhage or significant regional mass effect. 2. No other new intracranial abnormality. 3. Underlying mild chronic microvascular ischemic disease with remote lacunar infarcts about the bilateral basal ganglia and thalami. 4. Chronic left ethmoidal and left maxillary sinusitis, left OMU obstructive pattern. Electronically Signed   By: Morene Hoard M.D.   On: 03/23/2024 19:15   CT Head Wo Contrast Result Date: 03/23/2024 CLINICAL DATA:  Known stroke follow-up. EXAM: CT HEAD WITHOUT CONTRAST TECHNIQUE: Contiguous axial images were obtained from the base of the skull through the vertex without intravenous contrast. RADIATION DOSE REDUCTION: This exam was performed according to the departmental dose-optimization program which includes automated exposure control, adjustment of the mA and/or kV according to patient size and/or use of iterative reconstruction technique. COMPARISON:  Head CT 03/17/2024 and MRI brain 03/17/2024. FINDINGS: Brain: Ventricles, cisterns and other CSF spaces are normal. Evidence of patient's known subacute infarct over the left corona radiata as seen on recent MRI. Suggestion of tiny lacunar infarct over the right thalamus and left basal ganglia. No significant mass effect or midline shift. No acute hemorrhage. Remainder the exam is unchanged. Vascular: No hyperdense vessel or unexpected calcification. Skull: Normal. Negative for fracture or focal lesion. Sinuses/Orbits: Orbits are normal symmetric. Paranasal sinuses unchanged with complete opacification of  the left  maxillary sinus with opacification of the ethmoid air cells and mucosal membrane thickening over the sphenoid sinus. Other: None. IMPRESSION: 1. No acute findings. 2. Evidence of patient's known subacute infarct over the left corona radiata as seen on recent MRI. Suggestion of tiny old lacunar infarcts over the right thalamus and left basal ganglia. 3. Chronic sinus inflammatory disease. Electronically Signed   By: Toribio Agreste M.D.   On: 03/23/2024 14:04   DG Hip Unilat W or Wo Pelvis 2-3 Views Right Result Date: 03/23/2024 EXAM: 2 OR MORE VIEW(S) XRAY OF THE RIGHT HIP 03/23/2024 12:37:00 PM COMPARISON: None available. CLINICAL HISTORY: Fall FINDINGS: BONES AND JOINTS: 6.5 x 2.7 cm chondroid matrix lesion in proximal right femoral diaphysis consistent with benign enchondroma. Similar appearance on 05/16/2022. No acute fracture. No malalignment. SOFT TISSUES: The soft tissues are unremarkable. VASCULATURE: Atherosclerosis. IMPRESSION: 1. No acute fracture or dislocation. 2. Stable 6.5 x 2.7 cm benign enchondroma in the proximal right femoral diaphysis. 3. Incidental atherosclerosis. Electronically signed by: Ryan Salvage MD 03/23/2024 01:32 PM EST RP Workstation: [id number]   ECHOCARDIOGRAM COMPLETE Result Date: 03/18/2024    ECHOCARDIOGRAM REPORT   Patient Name:   CARSYN Sansom Date of Exam: 03/17/2024 Medical Rec #:  [id number]       Height:       68.0 in Accession #:    [id number]      Weight:       130.0 lb Date of Birth:  09-Jan-1971       BSA:          1.702 m Patient Age:    53 years        BP:           166/99 mmHg Patient Gender: M               HR:           63 bpm. Exam Location:  [facility] Procedure: 2D Echo, Cardiac Doppler, Color Doppler and Saline Contrast Bubble            Study (Both Spectral and Color Flow Doppler were utilized during            procedure). Indications:     Stroke I63.9  History:         Patient has no prior history of Echocardiogram examinations.                  Stroke.   Sonographer:     Rosina Dunk Referring Phys:  [id number] CORT ONEIDA MANA Diagnosing Phys: Marsa Dooms MD IMPRESSIONS  1. Left ventricular ejection fraction, by estimation, is 60 to 65%. The left ventricle has normal function. The left ventricle has no regional wall motion abnormalities. Left ventricular diastolic parameters were normal.  2. Right ventricular systolic function is normal. The right ventricular size is normal.  3. The mitral valve is normal in structure. Mild mitral valve regurgitation. No evidence of mitral stenosis.  4. The aortic valve is normal in structure. Aortic valve regurgitation is not visualized. No aortic stenosis is present.  5. The inferior vena cava is normal in size with greater than 50% respiratory variability, suggesting right atrial pressure of 3 mmHg. FINDINGS  Left Ventricle: Left ventricular ejection fraction, by estimation, is 60 to 65%. The left ventricle has normal function. The left ventricle has no regional wall motion abnormalities. Strain was performed and the global longitudinal strain is indeterminate. The left ventricular internal cavity size was normal in size.  There is no left ventricular hypertrophy. Left ventricular diastolic parameters were normal. Right Ventricle: The right ventricular size is normal. No increase in right ventricular wall thickness. Right ventricular systolic function is normal. Left Atrium: Left atrial size was normal in size. Right Atrium: Right atrial size was normal in size. Pericardium: There is no evidence of pericardial effusion. Mitral Valve: The mitral valve is normal in structure. Mild mitral valve regurgitation. No evidence of mitral valve stenosis. MV peak gradient, 4.3 mmHg. The mean mitral valve gradient is 2.0 mmHg. Tricuspid Valve: The tricuspid valve is normal in structure. Tricuspid valve regurgitation is mild . No evidence of tricuspid stenosis. Aortic Valve: The aortic valve is normal in structure. Aortic valve  regurgitation is not visualized. No aortic stenosis is present. Aortic valve mean gradient measures 5.0 mmHg. Aortic valve peak gradient measures 10.6 mmHg. Aortic valve area, by VTI measures 3.15 cm. Pulmonic Valve: The pulmonic valve was normal in structure. Pulmonic valve regurgitation is not visualized. No evidence of pulmonic stenosis. Aorta: The aortic root is normal in size and structure. Venous: The inferior vena cava is normal in size with greater than 50% respiratory variability, suggesting right atrial pressure of 3 mmHg. IAS/Shunts: No atrial level shunt detected by color flow Doppler. Agitated saline contrast was given intravenously to evaluate for intracardiac shunting. Additional Comments: 3D was performed not requiring image post processing on an independent workstation and was indeterminate.  LEFT VENTRICLE PLAX 2D LVIDd:         4.20 cm     Diastology LVIDs:         1.90 cm     LV e' medial:    7.40 cm/s LV PW:         1.00 cm     LV E/e' medial:  12.6 LV IVS:        1.00 cm     LV e' lateral:   12.00 cm/s LVOT diam:     2.20 cm     LV E/e' lateral: 7.8 LV SV:         100 LV SV Index:   59 LVOT Area:     3.80 cm LV IVRT:       123 msec  LV Volumes (MOD) LV vol d, MOD A2C: 77.6 ml LV vol d, MOD A4C: 77.2 ml LV vol s, MOD A2C: 24.2 ml LV vol s, MOD A4C: 29.2 ml LV SV MOD A2C:     53.4 ml LV SV MOD A4C:     77.2 ml LV SV MOD BP:      52.3 ml RIGHT VENTRICLE RV Basal diam:  2.90 cm RV Mid diam:    2.90 cm RV S prime:     12.90 cm/s TAPSE (M-mode): 1.9 cm LEFT ATRIUM             Index        RIGHT ATRIUM           Index LA diam:        2.20 cm 1.29 cm/m   RA Area:     14.90 cm LA Vol (A2C):   42.9 ml 25.21 ml/m  RA Volume:   37.90 ml  22.27 ml/m LA Vol (A4C):   29.3 ml 17.22 ml/m LA Biplane Vol: 38.8 ml 22.80 ml/m  AORTIC VALVE                     PULMONIC VALVE AV Area (Vmax):    3.31 cm  PV Vmax:        1.19 m/s AV Area (Vmean):   3.04 cm      PV Vmean:       85.500 cm/s AV Area (VTI):      3.15 cm      PV VTI:         0.227 m AV Vmax:           163.00 cm/s   PV Peak grad:   5.7 mmHg AV Vmean:          104.000 cm/s  PV Mean grad:   3.0 mmHg AV VTI:            0.316 m       RVOT Peak grad: 2 mmHg AV Peak Grad:      10.6 mmHg AV Mean Grad:      5.0 mmHg LVOT Vmax:         142.00 cm/s LVOT Vmean:        83.300 cm/s LVOT VTI:          0.262 m LVOT/AV VTI ratio: 0.83  AORTA Ao Root diam: 3.30 cm Ao Asc diam:  2.40 cm MITRAL VALVE MV Area (PHT): 3.17 cm    SHUNTS MV Area VTI:   2.84 cm    Systemic VTI:  0.26 m MV Peak grad:  4.3 mmHg    Systemic Diam: 2.20 cm MV Mean grad:  2.0 mmHg    Pulmonic VTI:  0.158 m MV Vmax:       1.04 m/s MV Vmean:      61.2 cm/s MV Decel Time: 239 msec MV E velocity: 93.30 cm/s MV A velocity: 89.60 cm/s MV E/A ratio:  1.04 Marsa Dooms MD Electronically signed by Marsa Dooms MD Signature Date/Time: 03/18/2024/1:11:37 PM    Final    MR BRAIN WO CONTRAST Result Date: 03/17/2024 EXAM: MR Brain without Intravenous Contrast. CLINICAL HISTORY: 53 year old male with acute neurological deficit, stroke code presentation this morning. TECHNIQUE: Magnetic resonance images of the brain without intravenous contrast in multiple planes. CONTRAST: Without. COMPARISON: CT head, CTA, and CTP earlier today, 03/17/2024. FINDINGS: BRAIN: Subtle heterogeneous abnormal diffusion in the left corona radiata, tracking toward the posterior left lentiform (series 5 images 29 through 30) with punctate foci of restriction. No associated hemorrhage or mass effect. T2 heterogeneity in the bilateral deep gray nuclei compatible with multiple chronic lacunar infarcts, including thalamic involvement as seen on series 17 image 85. Small area of chronic left periventricular white matter lacunar infarction or encephalomalacia suspected on series 8 image 17. No cortical encephalomalacia or chronic cerebral blood products identified. Brainstem and cerebellum appear negative. No intracranial  hemorrhage. No midline shift or extra-axial fluid collection. No cerebellar tonsillar ectopia. The central arterial and venous flow voids are patent. VENTRICLES: No hydrocephalus. ORBITS: The orbits are normal. SINUSES AND MASTOIDS: Bilateral paranasal sinuses mucosal thickening and opacification is stable. Small volume retained secretions in the nasopharynx on series 8 image 4. Mastoids are well aerated. Grossly normal visible internal auditory structures. BONES: Background bone marrow signal within normal limits. Exaggerated cervical lordosis redemonstrated. Associated cervical facet arthropathy better demonstrated by CT. No acute fracture or focal osseous lesion. IMPRESSION: 1. Patchy acute small vessel type infarcts in the left corona radiata, tracking toward the posterior left lentiform. No associated hemorrhage or mass effect. 2. 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 Electronically signed by: Helayne Hurst MD 03/17/2024 09:23 AM EST  RP Workstation: [id number]   CT ANGIO HEAD NECK W WO CM W PERF (CODE STROKE) Result Date: 03/17/2024 EXAM: CTA Head and Neck with Perfusion 03/17/2024 02:16:07 AM TECHNIQUE: CTA of the head and neck was performed with the administration of intravenous contrast. 3D postprocessing with multiplanar reconstructions and MIPs was performed to evaluate the vascular anatomy. Cerebral perfusion analysis using computed tomography with contrast administration, including post-processing of parametric maps with determination of cerebral blood flow, cerebral blood volume, mean transit time and time-to-maximum. Automated exposure control, iterative reconstruction, and/or weight based adjustment of the mA/kV was utilized to reduce the radiation dose to as low as reasonably achievable. COMPARISON: None available CLINICAL HISTORY: Neuro deficit, acute, stroke suspected Neuro deficit, acute, stroke suspected FINDINGS: AORTIC ARCH AND  ARCH VESSELS: No dissection or arterial injury. No significant stenosis of the brachiocephalic or subclavian arteries. CERVICAL CAROTID ARTERIES: No dissection, arterial injury, or hemodynamically significant stenosis by NASCET criteria. CERVICAL VERTEBRAL ARTERIES: No dissection, arterial injury, or significant stenosis. LUNGS AND MEDIASTINUM: Unremarkable. SOFT TISSUES: No acute abnormality. BONES: No acute abnormality. ANTERIOR CIRCULATION: Internal carotid arteries are patent with moderate bilateral paraclinoid stenosis. No significant stenosis of the anterior cerebral arteries. No significant stenosis of the middle cerebral arteries. No aneurysm. POSTERIOR CIRCULATION: No significant stenosis of the posterior cerebral arteries. No significant stenosis of the basilar artery. No significant stenosis of the vertebral arteries. No aneurysm. OTHER: No dural venous sinus thrombosis on this non-dedicated study. EXAM QUALITY: Exam quality is adequate with diagnostic perfusion maps. No significant motion artifact. Appropriate arterial inflow and venous outflow curves. CORE INFARCT (CBF<30% volume): 0 mL TOTAL HYPOPERFUSION (Tmax>6s volume): 0 mL Mismatch volume: 0 mL Mismatch ratio: not applicable Location: not applicable IMPRESSION: 1. No acute large vessel occlusion. 2. Moderate bilateral paraclinoid ICA stenosis. 3. No evidence of core infarct or penumbra on CT perfusion. Electronically signed by: Gilmore Molt MD 03/17/2024 02:57 AM EST RP Workstation: [id number]   CT CERVICAL SPINE WO CONTRAST Result Date: 03/17/2024 EXAM: CT CERVICAL SPINE WITHOUT CONTRAST 03/17/2024 02:06:06 AM TECHNIQUE: CT of the cervical spine was performed without the administration of intravenous contrast. Multiplanar reformatted images are provided for review. Automated exposure control, iterative reconstruction, and/or weight based adjustment of the mA/kV was utilized to reduce the radiation dose to as low as reasonably achievable.  COMPARISON: None available. CLINICAL HISTORY: Neuro deficit, acute, stroke suspected. FINDINGS: CERVICAL SPINE: BONES AND ALIGNMENT: No acute fracture or traumatic malalignment. DEGENERATIVE CHANGES: Advanced bilateral degenerative facet disease diffusely. SOFT TISSUES: No prevertebral soft tissue swelling. IMPRESSION: 1. No acute abnormality of the cervical spine. 2. Advanced bilateral degenerative facet disease. Electronically signed by: Franky Crease MD 03/17/2024 02:22 AM EST RP Workstation: [id number]   CT HEAD CODE STROKE WO CONTRAST` Result Date: 03/17/2024 EXAM: CT HEAD WITHOUT 03/17/2024 02:02:17 AM TECHNIQUE: CT of the head was performed without the administration of intravenous contrast. Automated exposure control, iterative reconstruction, and/or weight based adjustment of the mA/kV was utilized to reduce the radiation dose to as low as reasonably achievable. COMPARISON: None available. CLINICAL HISTORY: Neuro deficit, acute, stroke suspected FINDINGS: BRAIN AND VENTRICLES: No acute intracranial hemorrhage. No mass effect or midline shift. No extra-axial fluid collection. No evidence of acute infarct. Small remote right thalamic and bilateral corona radiata lacunar infarcts. Patchy white matter hypodensities, compatible with chronic microvascular ischemic disease. No hydrocephalus. ORBITS: No acute abnormality. SINUSES AND MASTOIDS: No acute abnormality. SOFT TISSUES AND SKULL: No acute skull fracture. Findings discussed with Dr. Claudene via telephone at [facility]  PM. IMPRESSION: 1. No acute intracranial abnormality.  ASPECTS 10. 2. Small remote right thalamic and bilateral corona radiata lacunar infarcts. Electronically signed by: Gilmore Molt MD 03/17/2024 02:10 AM EST RP Workstation: [id number]     Subjective: - no chest pain, shortness of breath, no abdominal pain, nausea or vomiting.   Discharge Exam: BP (!) 157/108 (BP Location: Right Arm)   Pulse 82   Temp 97.8 F (36.6 C) (Oral)   Resp  18   SpO2 100%   General: Pt is alert, awake, not in acute distress Cardiovascular: RRR, S1/S2 +, no rubs, no gallops Respiratory: CTA bilaterally, no wheezing, no rhonchi Abdominal: Soft, NT, ND, bowel sounds + Extremities: no edema, no cyanosis    The results of significant diagnostics from this hospitalization (including imaging, microbiology, ancillary and laboratory) are listed below for reference.     Microbiology: No results found for this or any previous visit (from the past 240 hours).   Labs: Basic Metabolic Panel: Recent Labs  Lab 03/23/24 1200  NA 136  K 4.1  CL 100  CO2 25  GLUCOSE 125*  BUN 11  CREATININE 0.63  CALCIUM  9.1   Liver Function Tests: No results for input(s): AST, ALT, ALKPHOS, BILITOT, PROT, ALBUMIN in the last 168 hours. CBC: Recent Labs  Lab 03/23/24 1200  WBC 11.8*  NEUTROABS 8.1*  HGB 12.1*  HCT 35.0*  MCV 94.1  PLT 303   CBG: No results for input(s): GLUCAP in the last 168 hours. Hgb A1c No results for input(s): HGBA1C in the last 72 hours. Lipid Profile No results for input(s): CHOL, HDL, LDLCALC, TRIG, CHOLHDL, LDLDIRECT in the last 72 hours. Thyroid function studies No results for input(s): TSH, T4TOTAL, T3FREE, THYROIDAB in the last 72 hours.  Invalid input(s): FREET3 Urinalysis    Component Value Date/Time   COLORURINE YELLOW 03/23/2024 1246   APPEARANCEUR HAZY (A) 03/23/2024 1246   LABSPEC 1.025 03/23/2024 1246   PHURINE 5.0 03/23/2024 1246   GLUCOSEU NEGATIVE 03/23/2024 1246   HGBUR NEGATIVE 03/23/2024 1246   BILIRUBINUR NEGATIVE 03/23/2024 1246   KETONESUR NEGATIVE 03/23/2024 1246   PROTEINUR 30 (A) 03/23/2024 1246   NITRITE NEGATIVE 03/23/2024 1246   LEUKOCYTESUR NEGATIVE 03/23/2024 1246    FURTHER DISCHARGE INSTRUCTIONS:   Get Medicines reviewed and adjusted: Please take all your medications with you for your next visit with your Primary MD    Laboratory/radiological data: Please request your Primary MD to go over all hospital tests and procedure/radiological results at the follow up, please ask your Primary MD to get all Hospital records sent to his/her office.   In some cases, they will be blood work, cultures and biopsy results pending at the time of your discharge. Please request that your primary care M.D. goes through all the records of your hospital data and follows up on these results.   Also Note the following: If you experience worsening of your admission symptoms, develop shortness of breath, life threatening emergency, suicidal or homicidal thoughts you must seek medical attention immediately by calling 911 or calling your MD immediately  if symptoms less severe.   You must read complete instructions/literature along with all the possible adverse reactions/side effects for all the Medicines you take and that have been prescribed to you. Take any new Medicines after you have completely understood and accpet all the possible adverse reactions/side effects.    Do not drive when taking Pain medications or sleeping medications (Benzodaizepines)   Do not take more than prescribed  Pain, Sleep and Anxiety Medications. It is not advisable to combine anxiety,sleep and pain medications without talking with your primary care practitioner   Special Instructions: If you have smoked or chewed Tobacco  in the last 2 yrs please stop smoking, stop any regular Alcohol  and or any Recreational drug use.   Wear Seat belts while driving.   Please note: You were cared for by a hospitalist during your hospital stay. Once you are discharged, your primary care physician will handle any further medical issues. Please note that NO REFILLS for any discharge medications will be authorized once you are discharged, as it is imperative that you return to your primary care physician (or establish a relationship with a primary care physician if you do not  have one) for your post hospital discharge needs so that they can reassess your need for medications and monitor your lab values.  Time coordinating discharge: 35 minutes  SIGNED:  Nilda Fendt, MD, PhD 03/28/2024, 9:22 AM   "

## 2024-03-28 NOTE — Progress Notes (Signed)
 Occupational Therapy Treatment Patient Details Name: Isaac Meza MRN: [id number] DOB: 12-11-1970 Today's Date: 03/28/2024   History of present illness Isaac Meza is a 53 y.o. M who presented to [facility] on 03/23/24 with R side weakness, falls, and difficulty swallowing. MRI + for evolution of recent L corona radiata infarcts. PMHx: HTN, heavy ETOH use, and asthma.   OT comments  Pt seen this AM for OT treatment, eager to participate and discharge to rehab, pt hopeful for d/c today. Pt making great progress towards OT goals. Initiated session with PNF techniques to RUE in preparation for functional tasks. Pt responding well - see below for details. Pt tolerating OOB via Stedy well - CGA/min A to stand and maintain stance while engaged in simple grooming sinkside. OT facilitating functional movement pattern of RUE for face washing with fair maintenance of dynamic balance. Remains with R lateral lean intermittently. In chair at end of session; continue to recommend high-intensity post-acute rehab. OT to continue to follow.      If plan is discharge home, recommend the following:  Two people to help with walking and/or transfers;A lot of help with bathing/dressing/bathroom;Assistance with cooking/housework;Assistance with feeding;Direct supervision/assist for medications management;Direct supervision/assist for financial management;Assist for transportation;Help with stairs or ramp for entrance   Equipment Recommendations  Wheelchair (measurements OT);Wheelchair cushion (measurements OT);BSC/3in1    Recommendations for Other Services      Precautions / Restrictions Precautions Precautions: Fall Recall of Precautions/Restrictions: Impaired Precaution/Restrictions Comments: RHB paresis Restrictions Weight Bearing Restrictions Per Provider Order: No       Mobility Bed Mobility Overal bed mobility: Needs Assistance Bed Mobility: Supine to Sit     Supine to sit: Supervision, HOB  elevated, Used rails     General bed mobility comments: momentum swing to come to EOB, exited to the R side, reliant on bed rails    Transfers Overall transfer level: Needs assistance Equipment used: Ambulation equipment used Transfers: Sit to/from Stand Sit to Stand: Contact guard assist, Min assist, Via lift equipment           General transfer comment: Stood from bed by pulling on Stedy crossbar, cued for hand placement and continual mgmt of RUE; OT assisting with maintaining grip of RUE on Health And Safety Inspector via Lift Equipment: Stedy   Balance Overall balance assessment: Needs assistance Sitting-balance support: Single extremity supported, Feet supported Sitting balance-Leahy Scale: Good Sitting balance - Comments: seated EOB Postural control: Right lateral lean Standing balance support: Bilateral upper extremity supported, During functional activity, Reliant on assistive device for balance Standing balance-Leahy Scale: Poor Standing balance comment: reliant on Stedy and at least unilateral UE support via L UE (sink vanity, stedy)                           ADL either performed or assessed with clinical judgement   ADL Overall ADL's : Needs assistance/impaired     Grooming: Minimal assistance;Standing;Wash/dry face Grooming Details (indicate cue type and reason): standing in [place]; OT facilitating functional use of RUE via hand-over-hand to grasp/release washcloth; uses L hand for thoroughness                             Functional mobility during ADLs: Minimal assistance      Extremity/Trunk Assessment Upper Extremity Assessment Upper Extremity Assessment: Right hand dominant;RUE deficits/detail RUE Deficits / Details: flaccid; mild scapular elevation  Vision       Perception     Praxis     Communication Communication Communication: Impaired Factors Affecting Communication: Reduced clarity of speech (dysarthric)    Cognition Arousal: Alert Behavior During Therapy: WFL for tasks assessed/performed, Flat affect                                 Following commands: Intact        Cueing   Cueing Techniques: Verbal cues, Gestural cues, Tactile cues  Exercises Exercises: Other exercises Other Exercises Other Exercises: RUE PNF technique to scapula and shoulder girdle: TC to facilitate shoulder elevation in side-lying 1x10 and trunk lateral flexion in side-lying 1x10    Shoulder Instructions       General Comments educated pt and father on OT progress    Pertinent Vitals/ Pain       Pain Assessment Pain Assessment: No/denies pain  Home Living Family/patient expects to be discharged to:: Other (Comment)                                        Prior Functioning/Environment              Frequency  Min 3X/week        Progress Toward Goals  OT Goals(current goals can now be found in the care plan section)  Progress towards OT goals: Progressing toward goals     Plan      Co-evaluation                 AM-PAC OT 6 Clicks Daily Activity     Outcome Measure   Help from another person eating meals?: A Little Help from another person taking care of personal grooming?: A Little Help from another person toileting, which includes using toliet, bedpan, or urinal?: A Lot Help from another person bathing (including washing, rinsing, drying)?: A Lot Help from another person to put on and taking off regular upper body clothing?: A Lot Help from another person to put on and taking off regular lower body clothing?: Total 6 Click Score: 13    End of Session Equipment Utilized During Treatment: Gait belt  OT Visit Diagnosis: Unsteadiness on feet (R26.81);Hemiplegia and hemiparesis;History of falling (Z91.81);Other symptoms and signs involving cognitive function Hemiplegia - Right/Left: Right Hemiplegia - dominant/non-dominant: Dominant Hemiplegia  - caused by: Cerebral infarction   Activity Tolerance Patient tolerated treatment well   Patient Left in chair;with call bell/phone within reach;with chair alarm set;with family/visitor present   Nurse Communication Mobility status;Need for lift equipment        Time: 1020-1048 OT Time Calculation (min): 28 min  Charges: OT General Charges $OT Visit: 1 Visit OT Treatments $Self Care/Home Management : 8-22 mins $Neuromuscular Re-education: 8-22 mins  Isaac Meza, OTR/L [facility] Acute Rehabilitation Services [phone] Secure Chat Preferred  Isiaah Cuervo 03/28/2024, 1:18 PM

## 2024-03-28 NOTE — H&P (Signed)
 Physical Medicine and Rehabilitation Admission H&P        Chief Complaint  Patient presents with   Fall   Extremity Weakness  : HPI: Isaac Meza is a 53 year old right-handed male with history of childhood asthma hypertension and tobacco/alcohol use.  Per chart review patient lives with spouse as well as in-laws.  Wife works during the day.  Two-level home bed and bath main level with 2 steps to entry.  Independent prior to admission and working.  Presented 03/17/2024 with of right sided weakness and slurred speech with ataxic gait x 2 days.  MRI of the brain showed patchy acute small vessel ischemic type infarcts in the left corona radiata tracking towards the posterior left lentiform.  Echocardiogram showed ejection fraction of 60 to 65% no PFO identified.  CT angiogram with no severe stenosis noted.  Patient was placed on aspirin  and Plavix  for CVA prophylaxis x 3 weeks then aspirin  alone and was discharged to home on 03/19/2024.  Patient was doing well until the morning of 03/23/2024 with increasing weakness right side weakness and worsening slurred speech.  Repeat CT/MRI showed interval expansion and worsening and recently identified posterior left basal ganglia infarct now measuring up to 2.9 cm in size.  No associated hemorrhage or significant regional mass effect.  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  Neurology follow-up currently maintained on aspirin  81 mg daily and Brilinta  90 mg twice daily x 4 weeks then aspirin  alone.  Tolerating a regular diet.  Therapy evaluations completed due to patient's decreased functional mobility right side weakness was admitted for a comprehensive rehab program.   Review of Systems  Constitutional:  Negative for chills and fever.  HENT:  Negative for hearing loss.   Eyes:  Negative for blurred vision and double vision.  Respiratory:  Negative for cough, shortness  of breath and wheezing.   Cardiovascular:  Negative for chest pain, palpitations and leg swelling.  Gastrointestinal:  Positive for constipation. Negative for heartburn, nausea and vomiting.  Genitourinary:  Negative for dysuria, flank pain and hematuria.  Musculoskeletal:  Positive for joint pain and myalgias.  Skin:  Negative for rash.  Neurological:  Positive for dizziness, speech change and weakness.  All other systems reviewed and are negative.      Past Medical History:  Diagnosis Date   Asthma      childhood   Hypertension               Past Surgical History:  Procedure Laterality Date   APPENDECTOMY   2024   BACK SURGERY       ESOPHAGOGASTRODUODENOSCOPY (EGD) WITH PROPOFOL  N/A 08/01/2022    Procedure: ESOPHAGOGASTRODUODENOSCOPY (EGD) WITH PROPOFOL ;  Surgeon: Rollin Dover, MD;  Location[facility] END[facility]COPY;  Service: Gastroenterology;  Laterality: N/A;   EUS N/A 08/01/2022    Procedure: UPPER ENDOSCOPIC ULTRASOUND (EUS) LINEAR;  Surgeon: Rollin Dover, MD;  Location: [facility] ENDOSCOPY;  Service: Gastroenterology;  Laterality: N/A;   FINE NEEDLE ASPIRATION   08/01/2022    Procedure: FINE NEEDLE ASPIRATION (FNA) LINEAR;  Surgeon: Rollin Dover, MD;  Location: [facility] ENDOSCOPY;  Service: Gastroenterology;;   FRACTURE SURGERY Right 2007    arm   XI ROBOTIC LAPAROSCOPIC ASSISTED APPENDECTOMY N/A 05/16/2022    Procedure: XI ROBOTIC LAPAROSCOPIC ASSISTED APPENDECTOMY;  Surgeon: Desiderio Schanz, MD;  Location: [facility] ORS;  Service: General;  Laterality: N/A;        History reviewed. No pertinent family history.  Social History:  reports that he has been smoking cigarettes. He started smoking about 40 years ago. He has a 20.3 pack-year smoking history. He has never used smokeless tobacco. He reports current alcohol use of about 21.0 standard drinks of alcohol per week. He reports that he does not currently use drugs. Allergies: [Allergies]  [Allergies] No Known Allergies       Medications  Prior to Admission  Medication Sig Dispense Refill   amLODipine  (NORVASC ) 5 MG tablet Take 1 tablet (5 mg total) by mouth daily. 30 tablet 0   aspirin  EC 81 MG tablet Take 1 tablet (81 mg total) by mouth daily. Swallow whole. 30 tablet 0   clopidogrel  (PLAVIX ) 75 MG tablet Take 1 tablet (75 mg total) by mouth daily for 21 days. 21 tablet 0   naproxen sodium (ALEVE) 220 MG tablet Take 440 mg by mouth daily as needed (pain).       rosuvastatin  (CRESTOR ) 20 MG tablet Take 1 tablet (20 mg total) by mouth daily. 90 tablet 0   lisinopril  (ZESTRIL ) 20 MG tablet Take 1 tablet (20 mg total) by mouth daily. (Patient not taking: Reported on 03/23/2024) 90 tablet 1              Home: Home Living Family/patient expects to be discharged to:: Private residence Living Arrangements:  (spouse , mother and father in law live with couple) Available Help at Discharge:  (wife works days and in radiographer, therapeutic can asisst when wife works) Type of Home: [employer] Access: Stairs to enter Secretary/administrator of Steps: 2 Entrance Stairs-Rails: None Home Layout: Two level, Able to live on main level with bedroom/bathroom Bathroom Shower/Tub: Associate Professor: Yes Home Equipment: None  Lives With: Spouse, Family   Functional History: Prior Function Prior Level of Function : Independent/Modified Independent, Working/employed, Driving Mobility Comments: works 2 jobs ADLs Comments: independent   Functional Status:  Mobility: Bed Mobility Overal bed mobility: Needs Assistance Bed Mobility: Supine to Sit, Sit to Supine Supine to sit: Min assist, HOB elevated Sit to supine: Min assist, HOB elevated General bed mobility comments: Assist to manage RUE/RLE. Pt able to pull on bed pad to elevate trunk and use bed rail to scoot hips fwd til feet flat. Assist to bring RLE back into bed. Repositioned so RUE was elevated on pillow. Transfers Overall transfer level: Needs  assistance Equipment used: Ambulation equipment used Transfers: Sit to/from Stand Sit to Stand: Min assist, Via lift equipment Bed to/from chair/wheelchair/BSC transfer type:: Via Financial Planner via Lift Equipment: [employer] transfer comment: Pt stood using stedy. He completed 5x5 sit<>stands. PT assisted in placing RUE on crossbar and maintained grip. Pt pulled up with LUE on crossbar. Noted a bias to the left side when powering up. Staggered pt's feet so RLE was closer to the bed. Cued pt to increase fwd lean and come up in the middle or slightly biased to the right. Seated rest breaks between sets. Good eccentric control. PT aiding in bring RUE back to bed. Ambulation/Gait Ambulation/Gait assistance: Mod assist, +2 safety/equipment, Max assist (close chair follow) Gait Distance (Feet): 10 Feet (1x10, prolonged seated rest, 1x5, seated rest, 1x5) Assistive device: Rolling walker (2 wheels) Gait Pattern/deviations: Step-to pattern, Decreased stride length, Decreased stance time - right, Decreased weight shift to right, Decreased dorsiflexion - right, Ataxic, Knee flexed in stance - right, Knees buckling General Gait Details: Pt took short slow steps. He had difficulty placing RLE with ankle inverting.  Increased time and multi-modal cues to correct RLE position. Pt with limited R quad control. He had difficulty turning, taking multiple short steps. Pt's R knee buckled and he had a sudden significant R lateral lean requiring maxA to correct. PT quickly faciliated pt to rest in standard chair. Discussed the importance of taking seated rest break early. As pt fatigued, he lacked R foot clearence and required physical assist to adjust RLE. Gait velocity: decr Pre-gait activities: Pt engaged in weight shifts and standing marches inside stedy with minA for support.   ADL: ADL Overall ADL's : Needs assistance/impaired Eating/Feeding: Minimal assistance, Sitting, Bed level Grooming: Set up,  Sitting, Bed level Upper Body Bathing: Moderate assistance, Sitting Lower Body Bathing: Sitting/lateral leans, Maximal assistance Upper Body Dressing : Moderate assistance, Sitting Lower Body Dressing: Total assistance, Bed level   Cognition: Cognition Overall Cognitive Status: Impaired/Different from baseline Arousal/Alertness: Awake/alert Orientation Level: Oriented X4 Year: 2026 Cognition Arousal: Alert Behavior During Therapy: WFL for tasks assessed/performed, Flat affect Overall Cognitive Status: Impaired/Different from baseline   Physical Exam: Blood pressure (!) 157/108, pulse 82, temperature 97.8 F (36.6 C), temperature source Oral, resp. rate 18, SpO2 100%. Physical Exam Neurological:     Comments: Patient is alert.  Sitting up in bed.  Makes eye contact with examiner.  Follows commands.  Speech is dysarthric   Oriented x 3.   General: No acute distress Mood and affect are appropriate Heart: Regular rate and rhythm no rubs murmurs or extra sounds Lungs: Clear to auscultation, breathing unlabored, no rales or wheezes Abdomen: Positive bowel sounds, soft nontender to palpation, nondistended Extremities: No clubbing, cyanosis, or edema Skin: No evidence of breakdown, no evidence of rash Neurologic: Cranial nerves II through XII intact, motor strength is 5/5 in left and 0/5 right deltoid, bicep, tricep, grip, hip flexor, knee extensors, ankle dorsiflexor and plantar flexor There is 2 -/5 right hip knee extensor synergy Sensory exam normal sensation to light touch  in bilateral upper and lower extremities Cerebellar exam normal finger to nose to finger as well as heel to shin in left upper and lower extremities, unable to perform on right side due to weakness Musculoskeletal: Full range of motion in all 4 extremities. No joint swelling      Lab Results Last 48 Hours  No results found for this or any previous visit (from the past 48 hours).   Imaging Results (Last 48  hours)  No results found.         Blood pressure (!) 157/108, pulse 82, temperature 97.8 F (36.6 C), temperature source Oral, resp. rate 18, SpO2 100%.   Medical Problem List and Plan: 1. Functional deficits secondary to left radiata and basal ganglia infarct with right hemiplegia, dysarthria and aphasia             -patient may  shower             -ELOS/Goals: 10-14d supervision goals 2.  Antithrombotics: -DVT/anticoagulation:  Mechanical: Antiembolism stockings, thigh (TED hose) Bilateral lower extremities             -antiplatelet therapy: Aspirin  81 mg daily and Brilinta  90 mg twice daily x 4 weeks then aspirin  alone 3. Pain Management: Oxycodone  5 mg every 6 hours as needed 4. Mood/Behavior/Sleep: Provide emotional support             -antipsychotic agents: N/A 5. Neuropsych/cognition: This patient is capable of making decisions on his own behalf. 6. Skin/Wound Care: Routine skin checks 7. Fluids/Electrolytes/Nutrition: Routine  in and outs with follow-up chemistries 8.  Hyperlipidemia.  Crestor  9.  History of alcohol as well as tobacco use.  Continue Nicorette  gum.  Latest alcohol level 155 03/17/2024.  Provide counseling           Toribio JINNY Pitch, PA-C 03/28/2024 I have personally performed a face to face diagnostic evaluation of this patient.  Additionally, I have reviewed and concur with the physician assistant's documentation above. Prentice [name] Compton M.D. [facility] Fellow Am Acad of Phys Med and Rehab Diplomate Am Board of Electrodiagnostic Med Fellow Am Board of Interventional Pain

## 2024-03-28 NOTE — Progress Notes (Signed)
 Arrived to CIR today. Oriented patient to unit and to assigned room. Wife at bedside. Discussed unit protocols, safety plans, and education/scheduling. All questions answered at bedside.    Geni Armor, LPN

## 2024-03-28 NOTE — Discharge Summary (Signed)
 Physician Discharge Summary  Patient ID: Isaac Meza MRN: [id number] DOB/AGE: [birth year] 53 y.o.  Admit date: 03/28/2024 Discharge date: 04/12/2024  Discharge Diagnoses:  Principal Problem:   Infarction of left basal ganglia (HCC) Hyperlipidemia History of alcohol use History of tobacco use Hypertension  Discharged Condition: Stable  Significant Diagnostic Studies: VAS US  LOWER EXTREMITY VENOUS (DVT) Result Date: 04/03/2024  Lower Venous DVT Study Patient Name:  Isaac Meza  Date of Exam:   04/01/2024 Medical Rec #: [id number]        Accession #:    [id number] Date of Birth: 04-17-1970        Patient Gender: M Patient Age:   35 years Exam Location:  [facility] Procedure:      VAS US  LOWER EXTREMITY VENOUS (DVT) Referring Phys: JOESPH LIKES --------------------------------------------------------------------------------  Indications: Right lateral thigh pain.  Risk Factors: Recent CVA, now in the rehabilitation unit. Comparison Study: No prior study Performing Technologist: Alberta Lis RVS  Examination Guidelines: A complete evaluation includes B-mode imaging, spectral Doppler, color Doppler, and power Doppler as needed of all accessible portions of each vessel. Bilateral testing is considered an integral part of a complete examination. Limited examinations for reoccurring indications may be performed as noted. The reflux portion of the exam is performed with the patient in reverse Trendelenburg.  +---------+---------------+---------+-----------+----------+--------------+ RIGHT    CompressibilityPhasicitySpontaneityPropertiesThrombus Aging +---------+---------------+---------+-----------+----------+--------------+ CFV      Full           Yes      Yes                                 +---------+---------------+---------+-----------+----------+--------------+ SFJ      Full                                                         +---------+---------------+---------+-----------+----------+--------------+ FV Prox  Full                                                        +---------+---------------+---------+-----------+----------+--------------+ FV Mid   Full                                                        +---------+---------------+---------+-----------+----------+--------------+ FV DistalFull                                                        +---------+---------------+---------+-----------+----------+--------------+ PFV      Full                                                        +---------+---------------+---------+-----------+----------+--------------+ POP  Full           No       Yes                                 +---------+---------------+---------+-----------+----------+--------------+ PTV      Full                                                        +---------+---------------+---------+-----------+----------+--------------+ PERO     Full                                                        +---------+---------------+---------+-----------+----------+--------------+   +----+---------------+---------+-----------+----------+--------------+ LEFTCompressibilityPhasicitySpontaneityPropertiesThrombus Aging +----+---------------+---------+-----------+----------+--------------+ CFV Full           Yes      Yes                                 +----+---------------+---------+-----------+----------+--------------+ SFJ Full                                                        +----+---------------+---------+-----------+----------+--------------+    Summary: RIGHT: - There is no evidence of deep vein thrombosis in the lower extremity.  - No cystic structure found in the popliteal fossa.  LEFT: - No evidence of common femoral vein obstruction.   *See table(s) above for measurements and observations. Electronically signed by Debby Robertson on 04/03/2024  at 10:59:45 AM.    Final    DG CHEST PORT 1 VIEW Result Date: 04/02/2024 EXAM: 1 VIEW(S) XRAY OF THE CHEST 04/01/2024 11:47:14 AM COMPARISON: None available. CLINICAL HISTORY: 220523 Productive cough [id number] FINDINGS: LUNGS AND PLEURA: Elevated left hemidiaphragm. Bibasilar scarring or atelectasis. No focal pulmonary opacity. No pleural effusion. No pneumothorax. HEART AND MEDIASTINUM: No acute abnormality of the cardiac and mediastinal silhouettes. BONES AND SOFT TISSUES: T4 vertebroplasty. Old healed bilateral rib fractures. IMPRESSION: 1. No acute cardiopulmonary abnormality. 2. Elevated left hemidiaphragm with bibasilar scarring or atelectasis. Electronically signed by: Greig Pique MD 04/02/2024 12:40 AM EST RP Workstation: [id number]   CT Head Wo Contrast Result Date: 03/24/2024 EXAM: CT HEAD WITHOUT CONTRAST 03/24/2024 09:09:00 AM TECHNIQUE: CT of the head was performed without the administration of intravenous contrast. Automated exposure control, iterative reconstruction, and/or weight based adjustment of the mA/kV was utilized to reduce the radiation dose to as low as reasonably achievable. 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 FINDINGS: BRAIN AND VENTRICLES: No acute hemorrhage. Evolving infarct in left corona radiata. Acute infarct in right thalamus better seen on prior brain MRI. No hydrocephalus. No extra-axial collection. No mass effect or midline shift. ORBITS: No acute abnormality. SINUSES: Complete opacification of left maxillary sinus. SOFT TISSUES AND SKULL: No acute soft tissue abnormality. No skull fracture. IMPRESSION: 1. Evolving infarct in left corona radiata. 2. Acute infarct  in right thalamus, better seen on prior brain MRI. 3. Complete opacification of left maxillary sinus. Electronically signed by: Evalene Coho MD 03/24/2024 10:02 AM EST RP Workstation: [id number]    MR BRAIN WO CONTRAST Result Date: 03/23/2024 CLINICAL DATA:  Initial evaluation for acute increase in weakness, recent stroke. EXAM: MRI HEAD WITHOUT CONTRAST TECHNIQUE: Multiplanar, multiecho pulse sequences of the brain and surrounding structures were obtained without intravenous contrast. COMPARISON:  CT from earlier the same day as well as recent MRI from 03/17/2024. FINDINGS: Brain: Examination mildly degraded by motion artifact. Cerebral volume within normal limits. Patchy T2/FLAIR hyperintensity involving the supratentorial cerebral white matter, most characteristic of chronic microvascular ischemic disease, mild in nature. Remote lacunar infarcts present about the bilateral basal ganglia and thalami. There has been interval expansion and worsening in previously identified posterior left basal ganglia infarct, now measuring up to 2.9 cm in size (series 2, image 32). No associated hemorrhage or significant regional mass effect. No other areas of new or interval ischemia elsewhere. Gray-white matter differentiation otherwise maintained. No other acute or chronic intracranial blood products. No mass lesion, midline shift or mass effect. No hydrocephalus or extra-axial fluid collection. Pituitary gland within normal limits. Vascular: Major intracranial vascular flow voids are maintained. Skull and upper cervical spine: Cranial junction within normal limits. Exaggeration of the normal cervical lordosis noted. No scalp soft tissue abnormality. Sinuses/Orbits: Globes orbital soft tissues within normal limits. Chronic left ethmoidal and left maxillary sinusitis, left OM U obstructive pattern. Trace right mastoid effusion, of doubtful significance. Other: None. IMPRESSION: 1. Interval expansion and worsening in recently identified posterior left basal ganglia infarct, now measuring up to 2.9 cm in size. No associated hemorrhage or significant regional mass effect. 2. No other new intracranial abnormality. 3.  Underlying mild chronic microvascular ischemic disease with remote lacunar infarcts about the bilateral basal ganglia and thalami. 4. Chronic left ethmoidal and left maxillary sinusitis, left OMU obstructive pattern. Electronically Signed   By: Morene Hoard M.D.   On: 03/23/2024 19:15   CT Head Wo Contrast Result Date: 03/23/2024 CLINICAL DATA:  Known stroke follow-up. EXAM: CT HEAD WITHOUT CONTRAST TECHNIQUE: Contiguous axial images were obtained from the base of the skull through the vertex without intravenous contrast. RADIATION DOSE REDUCTION: This exam was performed according to the departmental dose-optimization program which includes automated exposure control, adjustment of the mA and/or kV according to patient size and/or use of iterative reconstruction technique. COMPARISON:  Head CT 03/17/2024 and MRI brain 03/17/2024. FINDINGS: Brain: Ventricles, cisterns and other CSF spaces are normal. Evidence of patient's known subacute infarct over the left corona radiata as seen on recent MRI. Suggestion of tiny lacunar infarct over the right thalamus and left basal ganglia. No significant mass effect or midline shift. No acute hemorrhage. Remainder the exam is unchanged. Vascular: No hyperdense vessel or unexpected calcification. Skull: Normal. Negative for fracture or focal lesion. Sinuses/Orbits: Orbits are normal symmetric. Paranasal sinuses unchanged with complete opacification of the left maxillary sinus with opacification of the ethmoid air cells and mucosal membrane thickening over the sphenoid sinus. Other: None. IMPRESSION: 1. No acute findings. 2. Evidence of patient's known subacute infarct over the left corona radiata as seen on recent MRI. Suggestion of tiny old lacunar infarcts over the right thalamus and left basal ganglia. 3. Chronic sinus inflammatory disease. Electronically Signed   By: Toribio Agreste M.D.   On: 03/23/2024 14:04   DG Hip Unilat W or Wo Pelvis 2-3 Views Right Result  Date: 03/23/2024  EXAM: 2 OR MORE VIEW(S) XRAY OF THE RIGHT HIP 03/23/2024 12:37:00 PM COMPARISON: None available. CLINICAL HISTORY: Fall FINDINGS: BONES AND JOINTS: 6.5 x 2.7 cm chondroid matrix lesion in proximal right femoral diaphysis consistent with benign enchondroma. Similar appearance on 05/16/2022. No acute fracture. No malalignment. SOFT TISSUES: The soft tissues are unremarkable. VASCULATURE: Atherosclerosis. IMPRESSION: 1. No acute fracture or dislocation. 2. Stable 6.5 x 2.7 cm benign enchondroma in the proximal right femoral diaphysis. 3. Incidental atherosclerosis. Electronically signed by: Ryan Salvage MD 03/23/2024 01:32 PM EST RP Workstation: [id number]   ECHOCARDIOGRAM COMPLETE Result Date: 03/18/2024    ECHOCARDIOGRAM REPORT   Patient Name:   DEANGLEO Mercier Date of Exam: 03/17/2024 Medical Rec #:  [id number]       Height:       68.0 in Accession #:    [id number]      Weight:       130.0 lb Date of Birth:  1970-12-06       BSA:          1.702 m Patient Age:    53 years        BP:           166/99 mmHg Patient Gender: M               HR:           63 bpm. Exam Location:  [facility] Procedure: 2D Echo, Cardiac Doppler, Color Doppler and Saline Contrast Bubble            Study (Both Spectral and Color Flow Doppler were utilized during            procedure). Indications:     Stroke I63.9  History:         Patient has no prior history of Echocardiogram examinations.                  Stroke.  Sonographer:     Rosina Dunk Referring Phys:  [id number] CORT ONEIDA MANA Diagnosing Phys: Marsa Dooms MD IMPRESSIONS  1. Left ventricular ejection fraction, by estimation, is 60 to 65%. The left ventricle has normal function. The left ventricle has no regional wall motion abnormalities. Left ventricular diastolic parameters were normal.  2. Right ventricular systolic function is normal. The right ventricular size is normal.  3. The mitral valve is normal in structure. Mild mitral valve regurgitation.  No evidence of mitral stenosis.  4. The aortic valve is normal in structure. Aortic valve regurgitation is not visualized. No aortic stenosis is present.  5. The inferior vena cava is normal in size with greater than 50% respiratory variability, suggesting right atrial pressure of 3 mmHg. FINDINGS  Left Ventricle: Left ventricular ejection fraction, by estimation, is 60 to 65%. The left ventricle has normal function. The left ventricle has no regional wall motion abnormalities. Strain was performed and the global longitudinal strain is indeterminate. The left ventricular internal cavity size was normal in size. There is no left ventricular hypertrophy. Left ventricular diastolic parameters were normal. Right Ventricle: The right ventricular size is normal. No increase in right ventricular wall thickness. Right ventricular systolic function is normal. Left Atrium: Left atrial size was normal in size. Right Atrium: Right atrial size was normal in size. Pericardium: There is no evidence of pericardial effusion. Mitral Valve: The mitral valve is normal in structure. Mild mitral valve regurgitation. No evidence of mitral valve stenosis. MV peak gradient, 4.3 mmHg. The mean mitral valve gradient is 2.0 mmHg. Tricuspid  Valve: The tricuspid valve is normal in structure. Tricuspid valve regurgitation is mild . No evidence of tricuspid stenosis. Aortic Valve: The aortic valve is normal in structure. Aortic valve regurgitation is not visualized. No aortic stenosis is present. Aortic valve mean gradient measures 5.0 mmHg. Aortic valve peak gradient measures 10.6 mmHg. Aortic valve area, by VTI measures 3.15 cm. Pulmonic Valve: The pulmonic valve was normal in structure. Pulmonic valve regurgitation is not visualized. No evidence of pulmonic stenosis. Aorta: The aortic root is normal in size and structure. Venous: The inferior vena cava is normal in size with greater than 50% respiratory variability, suggesting right atrial  pressure of 3 mmHg. IAS/Shunts: No atrial level shunt detected by color flow Doppler. Agitated saline contrast was given intravenously to evaluate for intracardiac shunting. Additional Comments: 3D was performed not requiring image post processing on an independent workstation and was indeterminate.  LEFT VENTRICLE PLAX 2D LVIDd:         4.20 cm     Diastology LVIDs:         1.90 cm     LV e' medial:    7.40 cm/s LV PW:         1.00 cm     LV E/e' medial:  12.6 LV IVS:        1.00 cm     LV e' lateral:   12.00 cm/s LVOT diam:     2.20 cm     LV E/e' lateral: 7.8 LV SV:         100 LV SV Index:   59 LVOT Area:     3.80 cm LV IVRT:       123 msec  LV Volumes (MOD) LV vol d, MOD A2C: 77.6 ml LV vol d, MOD A4C: 77.2 ml LV vol s, MOD A2C: 24.2 ml LV vol s, MOD A4C: 29.2 ml LV SV MOD A2C:     53.4 ml LV SV MOD A4C:     77.2 ml LV SV MOD BP:      52.3 ml RIGHT VENTRICLE RV Basal diam:  2.90 cm RV Mid diam:    2.90 cm RV S prime:     12.90 cm/s TAPSE (M-mode): 1.9 cm LEFT ATRIUM             Index        RIGHT ATRIUM           Index LA diam:        2.20 cm 1.29 cm/m   RA Area:     14.90 cm LA Vol (A2C):   42.9 ml 25.21 ml/m  RA Volume:   37.90 ml  22.27 ml/m LA Vol (A4C):   29.3 ml 17.22 ml/m LA Biplane Vol: 38.8 ml 22.80 ml/m  AORTIC VALVE                     PULMONIC VALVE AV Area (Vmax):    3.31 cm      PV Vmax:        1.19 m/s AV Area (Vmean):   3.04 cm      PV Vmean:       85.500 cm/s AV Area (VTI):     3.15 cm      PV VTI:         0.227 m AV Vmax:           163.00 cm/s   PV Peak grad:   5.7 mmHg AV Vmean:  104.000 cm/s  PV Mean grad:   3.0 mmHg AV VTI:            0.316 m       RVOT Peak grad: 2 mmHg AV Peak Grad:      10.6 mmHg AV Mean Grad:      5.0 mmHg LVOT Vmax:         142.00 cm/s LVOT Vmean:        83.300 cm/s LVOT VTI:          0.262 m LVOT/AV VTI ratio: 0.83  AORTA Ao Root diam: 3.30 cm Ao Asc diam:  2.40 cm MITRAL VALVE MV Area (PHT): 3.17 cm    SHUNTS MV Area VTI:   2.84 cm    Systemic  VTI:  0.26 m MV Peak grad:  4.3 mmHg    Systemic Diam: 2.20 cm MV Mean grad:  2.0 mmHg    Pulmonic VTI:  0.158 m MV Vmax:       1.04 m/s MV Vmean:      61.2 cm/s MV Decel Time: 239 msec MV E velocity: 93.30 cm/s MV A velocity: 89.60 cm/s MV E/A ratio:  1.04 Marsa Dooms MD Electronically signed by Marsa Dooms MD Signature Date/Time: 03/18/2024/1:11:37 PM    Final    MR BRAIN WO CONTRAST Result Date: 03/17/2024 EXAM: MR Brain without Intravenous Contrast. CLINICAL HISTORY: 53 year old male with acute neurological deficit, stroke code presentation this morning. TECHNIQUE: Magnetic resonance images of the brain without intravenous contrast in multiple planes. CONTRAST: Without. COMPARISON: CT head, CTA, and CTP earlier today, 03/17/2024. FINDINGS: BRAIN: Subtle heterogeneous abnormal diffusion in the left corona radiata, tracking toward the posterior left lentiform (series 5 images 29 through 30) with punctate foci of restriction. No associated hemorrhage or mass effect. T2 heterogeneity in the bilateral deep gray nuclei compatible with multiple chronic lacunar infarcts, including thalamic involvement as seen on series 17 image 85. Small area of chronic left periventricular white matter lacunar infarction or encephalomalacia suspected on series 8 image 17. No cortical encephalomalacia or chronic cerebral blood products identified. Brainstem and cerebellum appear negative. No intracranial hemorrhage. No midline shift or extra-axial fluid collection. No cerebellar tonsillar ectopia. The central arterial and venous flow voids are patent. VENTRICLES: No hydrocephalus. ORBITS: The orbits are normal. SINUSES AND MASTOIDS: Bilateral paranasal sinuses mucosal thickening and opacification is stable. Small volume retained secretions in the nasopharynx on series 8 image 4. Mastoids are well aerated. Grossly normal visible internal auditory structures. BONES: Background bone marrow signal within normal limits.  Exaggerated cervical lordosis redemonstrated. Associated cervical facet arthropathy better demonstrated by CT. No acute fracture or focal osseous lesion. IMPRESSION: 1. Patchy acute small vessel type infarcts in the left corona radiata, tracking toward the posterior left lentiform. No associated hemorrhage or mass effect. 2. 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 Electronically signed by: Helayne Hurst MD 03/17/2024 09:23 AM EST RP Workstation: [id number]   CT ANGIO HEAD NECK W WO CM W PERF (CODE STROKE) Result Date: 03/17/2024 EXAM: CTA Head and Neck with Perfusion 03/17/2024 02:16:07 AM TECHNIQUE: CTA of the head and neck was performed with the administration of intravenous contrast. 3D postprocessing with multiplanar reconstructions and MIPs was performed to evaluate the vascular anatomy. Cerebral perfusion analysis using computed tomography with contrast administration, including post-processing of parametric maps with determination of cerebral blood flow, cerebral blood volume, mean transit time and time-to-maximum. Automated exposure control, iterative reconstruction, and/or weight  based adjustment of the mA/kV was utilized to reduce the radiation dose to as low as reasonably achievable. COMPARISON: None available CLINICAL HISTORY: Neuro deficit, acute, stroke suspected Neuro deficit, acute, stroke suspected FINDINGS: AORTIC ARCH AND ARCH VESSELS: No dissection or arterial injury. No significant stenosis of the brachiocephalic or subclavian arteries. CERVICAL CAROTID ARTERIES: No dissection, arterial injury, or hemodynamically significant stenosis by NASCET criteria. CERVICAL VERTEBRAL ARTERIES: No dissection, arterial injury, or significant stenosis. LUNGS AND MEDIASTINUM: Unremarkable. SOFT TISSUES: No acute abnormality. BONES: No acute abnormality. ANTERIOR CIRCULATION: Internal carotid arteries are patent with moderate bilateral paraclinoid  stenosis. No significant stenosis of the anterior cerebral arteries. No significant stenosis of the middle cerebral arteries. No aneurysm. POSTERIOR CIRCULATION: No significant stenosis of the posterior cerebral arteries. No significant stenosis of the basilar artery. No significant stenosis of the vertebral arteries. No aneurysm. OTHER: No dural venous sinus thrombosis on this non-dedicated study. EXAM QUALITY: Exam quality is adequate with diagnostic perfusion maps. No significant motion artifact. Appropriate arterial inflow and venous outflow curves. CORE INFARCT (CBF<30% volume): 0 mL TOTAL HYPOPERFUSION (Tmax>6s volume): 0 mL Mismatch volume: 0 mL Mismatch ratio: not applicable Location: not applicable IMPRESSION: 1. No acute large vessel occlusion. 2. Moderate bilateral paraclinoid ICA stenosis. 3. No evidence of core infarct or penumbra on CT perfusion. Electronically signed by: Gilmore Molt MD 03/17/2024 02:57 AM EST RP Workstation: [id number]   CT CERVICAL SPINE WO CONTRAST Result Date: 03/17/2024 EXAM: CT CERVICAL SPINE WITHOUT CONTRAST 03/17/2024 02:06:06 AM TECHNIQUE: CT of the cervical spine was performed without the administration of intravenous contrast. Multiplanar reformatted images are provided for review. Automated exposure control, iterative reconstruction, and/or weight based adjustment of the mA/kV was utilized to reduce the radiation dose to as low as reasonably achievable. COMPARISON: None available. CLINICAL HISTORY: Neuro deficit, acute, stroke suspected. FINDINGS: CERVICAL SPINE: BONES AND ALIGNMENT: No acute fracture or traumatic malalignment. DEGENERATIVE CHANGES: Advanced bilateral degenerative facet disease diffusely. SOFT TISSUES: No prevertebral soft tissue swelling. IMPRESSION: 1. No acute abnormality of the cervical spine. 2. Advanced bilateral degenerative facet disease. Electronically signed by: Franky Crease MD 03/17/2024 02:22 AM EST RP Workstation: [id number]   CT HEAD  CODE STROKE WO CONTRAST` Result Date: 03/17/2024 EXAM: CT HEAD WITHOUT 03/17/2024 02:02:17 AM TECHNIQUE: CT of the head was performed without the administration of intravenous contrast. Automated exposure control, iterative reconstruction, and/or weight based adjustment of the mA/kV was utilized to reduce the radiation dose to as low as reasonably achievable. COMPARISON: None available. CLINICAL HISTORY: Neuro deficit, acute, stroke suspected FINDINGS: BRAIN AND VENTRICLES: No acute intracranial hemorrhage. No mass effect or midline shift. No extra-axial fluid collection. No evidence of acute infarct. Small remote right thalamic and bilateral corona radiata lacunar infarcts. Patchy white matter hypodensities, compatible with chronic microvascular ischemic disease. No hydrocephalus. ORBITS: No acute abnormality. SINUSES AND MASTOIDS: No acute abnormality. SOFT TISSUES AND SKULL: No acute skull fracture. Findings discussed with Dr. Claudene via telephone at 2:08 PM. IMPRESSION: 1. No acute intracranial abnormality.  ASPECTS 10. 2. Small remote right thalamic and bilateral corona radiata lacunar infarcts. Electronically signed by: Gilmore Molt MD 03/17/2024 02:10 AM EST RP Workstation: [id number]    Labs:  Basic Metabolic Panel: Recent Labs  Lab 04/07/24 0609 04/11/24 0600  NA 138 137  K 4.5 4.5  CL 102 100  CO2 27 28  GLUCOSE 111* 110*  BUN 9 7  CREATININE 0.75 0.81  CALCIUM  9.0 9.1    CBC: Recent Labs  Lab 04/07/24 0609 04/11/24  0600  WBC 12.0* 11.2*  NEUTROABS 9.3* 8.1*  HGB 11.0* 11.5*  HCT 32.0* 33.5*  MCV 93.8 95.2  PLT 411* 440*    CBG: No results for input(s): GLUCAP in the last 168 hours.  Brief HPI:   Adrion Menz is a 53 y.o. right-handed male with history of childhood asthma, hypertension, tobacco and alcohol use.  Per chart review lives with spouse as well as in-laws.  Wife works during the day.  Two-level home bed and bath main level.  Independent prior to  admission and working.  Presented 03/17/2024 with right sided weakness and slurred speech with ataxic gait x 2 days.  MRI of the brain showed patchy acute small vessel ischemic type infarcts in the left corona radiata tracking towards the posterior left lentiform.  Echocardiogram with ejection fraction of 60 to 65% no PFO identified.  CT angiogram with no stenosis noted.  Patient was placed on aspirin  81 mg daily and Plavix  75 mg daily for CVA prophylaxis x 3 weeks then aspirin  alone was discharged to home 03/19/2024.  Patient was doing well until the morning of 03/23/2024 with increasing weakness right side and worsening slurred speech.  Repeat CT/MRI showed interval expansion and worsening and recently identified posterior left basal ganglia infarction now measuring up to 2.9 cm in size.  No associated hemorrhage or significant regional mass effect.  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  Neurology follow-up recommendations were for aspirin  81 mg daily and Brilinta  90 mg twice daily x 4 weeks then aspirin  alone.  Tolerating a regular diet.  Therapy evaluations completed due to patient's decreased functional mobility and right side weakness was admitted for a comprehensive rehab program.   Hospital Course: Mayan Kloepfer was admitted to rehab 03/28/2024 for inpatient therapies to consist of PT, ST and OT at least three hours five days a week. Past admission physiatrist, therapy team and rehab RN have worked together to provide customized collaborative inpatient rehab.  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  Follow-up neurology services.  Currently maintained on aspirin  81 mg daily and Brilinta  90 mg twice daily x 4 weeks then aspirin  alone.  Pain management with very minimal use of oxycodone  as well as the addition of baclofen  as  needed for muscle spasms.  History of hypertension patient on Norvasc  5 mg daily and lisinopril  20 mg daily prior to admission and held initially for permissive hypertension and would need follow-up with PCP with low-dose lisinopril  resumed.  Crestor  ongoing for hyperlipidemia.  History of alcohol as well as tobacco use.  Maintained on Nicorette  gum.  Latest alcohol level 155 03/17/2024.  Patient received counts regards cessation of alcohol as well as tobacco products.   Blood pressures were monitored on TID basis and controlled monitored     Rehab course: During patient's stay in rehab weekly team conferences were held to monitor patient's progress, set goals and discuss barriers to discharge. At admission, patient required moderate assist to ambulate 10 feet rolling walker minimal assist sit to stand  He/She  has had improvement in activity tolerance, balance, postural control as well as ability to compensate for deficits. He/She has had improvement in functional use RUE/LUE  and RLE/LLE as well as improvement in awareness.  Working with energy conservation techniques.  Perform squat pivot transfers to wheelchair with contact-guard.  Ambulates 80-100 feet rolling walker with right hand splint  requiring min/mod assist with consistent manual facilitation of right hand stability.  During ADLs he completes upper body ADLs with supervision as well as lower body with minimal assist.  He is completing squat pivot contact-guard for ADLs.  He is able to maintain standing balance with contact-guard assist.  Full family teaching completed plan discharge to home.       Disposition:  Discharge disposition: 01-Home or Self Care        Diet: Regular  Special Instructions: No driving smoking or alcohol  Medications at discharge. 1.  Tylenol  as needed 2.  Aspirin  81 mg p.o. daily 3.  Folic acid  1 mg p.o. daily 4.  Multivitamin daily 5.  Nicorette  gum as needed 6.  Crestor  20 mg p.o. daily 7.   Thiamine  100 mg p.o. daily 8.  Brilinta  90 mg twice daily until 04/22/2024 and stop 9.  Baclofen  10 mg 3 times daily as needed 10.  Colace 100 mg twice daily 11.  Lidocaine  patch changes directed 12.  Lisinopril  5 mg daily 13.  Oxycodone  5 mg every 6 hours as needed pain  30-35 minutes were spent completing discharge summary and discharge planning  Discharge Instructions     Ambulatory referral to Neurology   Complete by: As directed    An appointment is requested in approximately: 4 weeks left basal ganglia infarction   Ambulatory referral to Occupational Therapy   Complete by: As directed    Evaluate and treat   Ambulatory referral to Physical Medicine Rehab   Complete by: As directed    Moderate complexity follow-up 1 to 2 weeks left basal ganglia infarction   Ambulatory referral to Physical Therapy   Complete by: As directed    Evaluate and treat   Ambulatory referral to Speech Therapy   Complete by: As directed    Evaluate and treat        Follow-up Information     Emeline Joesph BROCKS, DO Follow up.   Specialty: Physical Medicine and Rehabilitation Why: Office to call for appointment Contact information: [address] [place] [address] [phone]         [name], DO Follow up.   Specialty: Internal Medicine Why: Call for appointment Contact information: [address] [place] [address] [phone]                 Signed: Toribio JINNY Pitch 04/12/2024, 4:33 AM

## 2024-03-28 NOTE — Progress Notes (Signed)
 Inpatient Rehab Admissions Coordinator:  There is a bed available for pt in CIR today. Dr. Trixie aware and in agreement. Pt, NSG and TOC made aware.   Tinnie Yvone Cohens, MS, CCC-SLP Admissions Coordinator [phone]

## 2024-03-28 NOTE — Plan of Care (Signed)

## 2024-03-28 NOTE — Plan of Care (Signed)
" °  Problem: Consults Goal: RH STROKE PATIENT EDUCATION Description: See Patient Education module for education specifics  Outcome: Progressing   Problem: RH SKIN INTEGRITY Goal: RH STG SKIN FREE OF INFECTION/BREAKDOWN Description: Manage skin free of infection with supervision assistance Outcome: Progressing   Problem: RH KNOWLEDGE DEFICIT Goal: RH STG INCREASE KNOWLEDGE OF HYPERTENSION Description: Manage increase  knowledge of hypertension from wife and family with supervision assistance using educational materials provided Outcome: Progressing   "

## 2024-03-28 NOTE — Plan of Care (Signed)
" °  Problem: Education: Goal: Knowledge of General Education information will improve Description: Including pain rating scale, medication(s)/side effects and non-pharmacologic comfort measures 03/28/2024 0900 by Berkeley Verdie BRAVO, RN Outcome: Progressing 03/28/2024 0859 by Berkeley Verdie BRAVO, RN Outcome: Progressing   Problem: Education: Goal: Knowledge of disease or condition will improve Outcome: Progressing   Problem: Ischemic Stroke/TIA Tissue Perfusion: Goal: Complications of ischemic stroke/TIA will be minimized Outcome: Progressing   Problem: Health Behavior/Discharge Planning: Goal: Ability to manage health-related needs will improve Outcome: Progressing Goal: Goals will be collaboratively established with patient/family Outcome: Progressing   "

## 2024-03-28 NOTE — Discharge Instructions (Addendum)
 Inpatient Rehab Discharge Instructions  Isaac Meza Discharge date and time: No discharge date for patient encounter.   Activities/Precautions/ Functional Status: Activity: As tolerated Diet: Regular Wound Care: Routine skin checks Functional status:  ___ No restrictions     ___ Walk up steps independently ___ 24/7 supervision/assistance   ___ Walk up steps with assistance ___ Intermittent supervision/assistance  ___ Bathe/dress independently ___ Walk with walker     __x_ Bathe/dress with assistance ___ Walk Independently    ___ Shower independently ___ Walk with assistance    ___ Shower with assistance ___ No alcohol     ___ Return to work/school ________  Special Instructions: No driving smoking or alcohol    COMMUNITY REFERRALS UPON DISCHARGE:     Outpatient: PT      OT     ST              Agency: [facility]  Phone: [phone]              Appointment Date/Time: *Please expect follow-up within 7-10 business days to schedule your appointment. If you have not received follow-up, be sure to contact the site directly.*   Medical Equipment/Items Ordered: TTB , wheelchair, and 3in1 BSC                                                  Agency/Supplier:  [facility] [phone]  GENERAL COMMUNITY RESOURCES FOR PATIENT/FAMILY:  1) A PCS (personal care services) referral was sent to the LTSS Dept [phone]. Be sure to call and schedule your home visit.   2) If you require transportation services for medical appointments, be sure to contact [employer] [phone] atleast 3-5 business days in advance.   My questions have been answered and I understand these instructions. I will adhere to these goals and the provided educational materials after my discharge from the hospital.  Patient/Caregiver Signature _______________________________ Date __________  Clinician Signature _______________________________________ Date __________  Please  bring this form and your medication list with you to all your follow-up doctor's appointments. STROKE/TIA DISCHARGE INSTRUCTIONS SMOKING Cigarette smoking nearly doubles your risk of having a stroke & is the single most alterable risk factor  If you smoke or have smoked in the last 12 months, you are advised to quit smoking for your health. Most of the excess cardiovascular risk related to smoking disappears within a year of stopping. Ask you doctor about anti-smoking medications [place] Quit Line: 1-800-QUIT NOW Free Smoking Cessation Classes [phone]  CHOLESTEROL Know your levels; limit fat & cholesterol in your diet  Lipid Panel     Component Value Date/Time   CHOL 139 03/18/2024 0611   CHOL 168 08/14/2022 0958   TRIG 116 03/18/2024 0611   HDL 69 03/18/2024 0611   HDL 40 08/14/2022 0958   CHOLHDL 2.0 03/18/2024 0611   VLDL 23 03/18/2024 0611   LDLCALC 47 03/18/2024 0611   LDLCALC 130 (H) 11/24/2023 0000     Many patients benefit from treatment even if their cholesterol is at goal. Goal: Total Cholesterol (CHOL) less than 160 Goal:  Triglycerides (TRIG) less than 150 Goal:  HDL greater than 40 Goal:  LDL (LDLCALC) less than 100   BLOOD PRESSURE American Stroke Association blood pressure target is less that 120/80 mm/Hg  Your discharge blood pressure is:  BP: (!) 156/85 Monitor your  blood pressure Limit your salt and alcohol intake Many individuals will require more than one medication for high blood pressure  DIABETES (A1c is a blood sugar average for last 3 months) Goal HGBA1c is under 7% (HBGA1c is blood sugar average for last 3 months)  Diabetes: No known diagnosis of diabetes    Lab Results  Component Value Date   HGBA1C 5.4 11/24/2023    Your HGBA1c can be lowered with medications, healthy diet, and exercise. Check your blood sugar as directed by your physician Call your physician if you experience unexplained or low blood sugars.  PHYSICAL ACTIVITY/REHABILITATION  Goal is 30 minutes at least 4 days per week  Activity: Increase activity slowly, Therapies: Physical Therapy: Home Health Return to work:  Activity decreases your risk of heart attack and stroke and makes your heart stronger.  It helps control your weight and blood pressure; helps you relax and can improve your mood. Participate in a regular exercise program. Talk with your doctor about the best form of exercise for you (dancing, walking, swimming, cycling).  DIET/WEIGHT Goal is to maintain a healthy weight  Your discharge diet is:  Diet Order             Diet regular Room service appropriate? Yes; Fluid consistency: Thin  Diet effective now                   liquids Your height is:  Height: 5' 8 (172.7 cm) Your current weight is: Weight: 54.5 kg Your Body Mass Index (BMI) is:  BMI (Calculated): 18.27 Following the type of diet specifically designed for you will help prevent another stroke. Your goal weight range is:   Your goal Body Mass Index (BMI) is 19-24. Healthy food habits can help reduce 3 risk factors for stroke:  High cholesterol, hypertension, and excess weight.  RESOURCES Stroke/Support Group:  Call [phone]   STROKE EDUCATION PROVIDED/REVIEWED AND GIVEN TO PATIENT Stroke warning signs and symptoms How to activate emergency medical system (call 911). Medications prescribed at discharge. Need for follow-up after discharge. Personal risk factors for stroke. Pneumonia vaccine given: No Flu vaccine given: No My questions have been answered, the writing is legible, and I understand these instructions.  I will adhere to these goals & educational materials that have been provided to me after my discharge from the hospital.

## 2024-03-28 NOTE — H&P (Shared)
 "   Physical Medicine and Rehabilitation Admission H&P    Chief Complaint  Patient presents with   Fall   Extremity Weakness  : HPI: Isaac Meza is a 53 year old right-handed male with history of childhood asthma hypertension and tobacco/alcohol use.  Per chart review patient lives with spouse as well as in-laws.  Wife works during the day.  Two-level home bed and bath main level with 2 steps to entry.  Independent prior to admission and working.  Presented 03/17/2024 with of right sided weakness and slurred speech with ataxic gait x 2 days.  MRI of the brain showed patchy acute small vessel ischemic type infarcts in the left corona radiata tracking towards the posterior left lentiform.  Echocardiogram showed ejection fraction of 60 to 65% no PFO identified.  CT angiogram with no severe stenosis noted.  Patient was placed on aspirin  and Plavix  for CVA prophylaxis x 3 weeks then aspirin  alone and was discharged to home on 03/19/2024.  Patient was doing well until the morning of 03/23/2024 with increasing weakness right side weakness and worsening slurred speech.  Repeat CT/MRI showed interval expansion and worsening and recently identified posterior left basal ganglia infarct now measuring up to 2.9 cm in size.  No associated hemorrhage or significant regional mass effect.  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  Neurology follow-up currently maintained on aspirin  81 mg daily and Brilinta  90 mg twice daily x 4 weeks then aspirin  alone.  Tolerating a regular diet.  Therapy evaluations completed due to patient's decreased functional mobility right side weakness was admitted for a comprehensive rehab program.  Review of Systems  Constitutional:  Negative for chills and fever.  HENT:  Negative for hearing loss.   Eyes:  Negative for blurred vision and double vision.  Respiratory:  Negative for cough, shortness  of breath and wheezing.   Cardiovascular:  Negative for chest pain, palpitations and leg swelling.  Gastrointestinal:  Positive for constipation. Negative for heartburn, nausea and vomiting.  Genitourinary:  Negative for dysuria, flank pain and hematuria.  Musculoskeletal:  Positive for joint pain and myalgias.  Skin:  Negative for rash.  Neurological:  Positive for dizziness, speech change and weakness.  All other systems reviewed and are negative.  Past Medical History:  Diagnosis Date   Asthma    childhood   Hypertension    Past Surgical History:  Procedure Laterality Date   APPENDECTOMY  2024   BACK SURGERY     ESOPHAGOGASTRODUODENOSCOPY (EGD) WITH PROPOFOL  N/A 08/01/2022   Procedure: ESOPHAGOGASTRODUODENOSCOPY (EGD) WITH PROPOFOL ;  Surgeon: Rollin Dover, MD;  Location[facility] END[facility]COPY;  Service: Gastroenterology;  Laterality: N/A;   EUS N/A 08/01/2022   Procedure: UPPER ENDOSCOPIC ULTRASOUND (EUS) LINEAR;  Surgeon: Rollin Dover, MD;  Location: [facility] ENDOSCOPY;  Service: Gastroenterology;  Laterality: N/A;   FINE NEEDLE ASPIRATION  08/01/2022   Procedure: FINE NEEDLE ASPIRATION (FNA) LINEAR;  Surgeon: Rollin Dover, MD;  Location: [facility] ENDOSCOPY;  Service: Gastroenterology;;   FRACTURE SURGERY Right 2007   arm   XI ROBOTIC LAPAROSCOPIC ASSISTED APPENDECTOMY N/A 05/16/2022   Procedure: XI ROBOTIC LAPAROSCOPIC ASSISTED APPENDECTOMY;  Surgeon: Desiderio Schanz, MD;  Location: [facility] ORS;  Service: General;  Laterality: N/A;   History reviewed. No pertinent family history. Social History:  reports that he has been smoking cigarettes. He started smoking about 40 years ago. He has a 20.3 pack-year smoking history. He has never used smokeless tobacco. He reports current alcohol  use of about 21.0 standard drinks of alcohol per week. He reports that he does not currently use drugs. Allergies: Allergies[1] Medications Prior to Admission  Medication Sig Dispense Refill   amLODipine  (NORVASC ) 5 MG  tablet Take 1 tablet (5 mg total) by mouth daily. 30 tablet 0   aspirin  EC 81 MG tablet Take 1 tablet (81 mg total) by mouth daily. Swallow whole. 30 tablet 0   clopidogrel  (PLAVIX ) 75 MG tablet Take 1 tablet (75 mg total) by mouth daily for 21 days. 21 tablet 0   naproxen sodium (ALEVE) 220 MG tablet Take 440 mg by mouth daily as needed (pain).     rosuvastatin  (CRESTOR ) 20 MG tablet Take 1 tablet (20 mg total) by mouth daily. 90 tablet 0   lisinopril  (ZESTRIL ) 20 MG tablet Take 1 tablet (20 mg total) by mouth daily. (Patient not taking: Reported on 03/23/2024) 90 tablet 1      Home: Home Living Family/patient expects to be discharged to:: Private residence Living Arrangements:  (spouse , mother and father in law live with couple) Available Help at Discharge:  (wife works days and in radiographer, therapeutic can asisst when wife works) Type of Home: [employer] Access: Stairs to enter Secretary/administrator of Steps: 2 Entrance Stairs-Rails: None Home Layout: Two level, Able to live on main level with bedroom/bathroom Bathroom Shower/Tub: Associate Professor: Yes Home Equipment: None  Lives With: Spouse, Family   Functional History: Prior Function Prior Level of Function : Independent/Modified Independent, Working/employed, Driving Mobility Comments: works 2 jobs ADLs Comments: independent  Functional Status:  Mobility: Bed Mobility Overal bed mobility: Needs Assistance Bed Mobility: Supine to Sit, Sit to Supine Supine to sit: Min assist, HOB elevated Sit to supine: Min assist, HOB elevated General bed mobility comments: Assist to manage RUE/RLE. Pt able to pull on bed pad to elevate trunk and use bed rail to scoot hips fwd til feet flat. Assist to bring RLE back into bed. Repositioned so RUE was elevated on pillow. Transfers Overall transfer level: Needs assistance Equipment used: Ambulation equipment used Transfers: Sit to/from Stand Sit to  Stand: Min assist, Via lift equipment Bed to/from chair/wheelchair/BSC transfer type:: Via Financial Planner via Lift Equipment: [employer] transfer comment: Pt stood using stedy. He completed 5x5 sit<>stands. PT assisted in placing RUE on crossbar and maintained grip. Pt pulled up with LUE on crossbar. Noted a bias to the left side when powering up. Staggered pt's feet so RLE was closer to the bed. Cued pt to increase fwd lean and come up in the middle or slightly biased to the right. Seated rest breaks between sets. Good eccentric control. PT aiding in bring RUE back to bed. Ambulation/Gait Ambulation/Gait assistance: Mod assist, +2 safety/equipment, Max assist (close chair follow) Gait Distance (Feet): 10 Feet (1x10, prolonged seated rest, 1x5, seated rest, 1x5) Assistive device: Rolling walker (2 wheels) Gait Pattern/deviations: Step-to pattern, Decreased stride length, Decreased stance time - right, Decreased weight shift to right, Decreased dorsiflexion - right, Ataxic, Knee flexed in stance - right, Knees buckling General Gait Details: Pt took short slow steps. He had difficulty placing RLE with ankle inverting. Increased time and multi-modal cues to correct RLE position. Pt with limited R quad control. He had difficulty turning, taking multiple short steps. Pt's R knee buckled and he had a sudden significant R lateral lean requiring maxA to correct. PT quickly faciliated pt to rest in standard chair. Discussed the importance of taking seated rest  break early. As pt fatigued, he lacked R foot clearence and required physical assist to adjust RLE. Gait velocity: decr Pre-gait activities: Pt engaged in weight shifts and standing marches inside stedy with minA for support.    ADL: ADL Overall ADL's : Needs assistance/impaired Eating/Feeding: Minimal assistance, Sitting, Bed level Grooming: Set up, Sitting, Bed level Upper Body Bathing: Moderate assistance, Sitting Lower Body Bathing:  Sitting/lateral leans, Maximal assistance Upper Body Dressing : Moderate assistance, Sitting Lower Body Dressing: Total assistance, Bed level  Cognition: Cognition Overall Cognitive Status: Impaired/Different from baseline Arousal/Alertness: Awake/alert Orientation Level: Oriented X4 Year: 2026 Cognition Arousal: Alert Behavior During Therapy: WFL for tasks assessed/performed, Flat affect Overall Cognitive Status: Impaired/Different from baseline  Physical Exam: Blood pressure (!) 157/108, pulse 82, temperature 97.8 F (36.6 C), temperature source Oral, resp. rate 18, SpO2 100%. Physical Exam Neurological:     Comments: Patient is alert.  Sitting up in bed.  Makes eye contact with examiner.  Follows commands.  Speech is a bit dysarthric but fully intelligible.  Oriented x 3.     No results found for this or any previous visit (from the past 48 hours). No results found.    Blood pressure (!) 157/108, pulse 82, temperature 97.8 F (36.6 C), temperature source Oral, resp. rate 18, SpO2 100%.  Medical Problem List and Plan: 1. Functional deficits secondary to left radiata and basal ganglia infarct  -patient may *** shower  -ELOS/Goals: *** 2.  Antithrombotics: -DVT/anticoagulation:  Mechanical: Antiembolism stockings, thigh (TED hose) Bilateral lower extremities  -antiplatelet therapy: Aspirin  81 mg daily and Brilinta  90 mg twice daily x 4 weeks then aspirin  alone 3. Pain Management: Oxycodone  5 mg every 6 hours as needed 4. Mood/Behavior/Sleep: Provide emotional support  -antipsychotic agents: N/A 5. Neuropsych/cognition: This patient is capable of making decisions on his own behalf. 6. Skin/Wound Care: Routine skin checks 7. Fluids/Electrolytes/Nutrition: Routine in and outs with follow-up chemistries 8.  Hyperlipidemia.  Crestor  9.  History of alcohol as well as tobacco use.  Continue Nicorette  gum.  Latest alcohol level 155 03/17/2024.  Provide  counseling      Toribio JINNY Pitch, PA-C 03/28/2024     [1] No Known Allergies  "

## 2024-03-29 LAB — CBC WITH DIFFERENTIAL/PLATELET
Abs Immature Granulocytes: 0.05 K/uL (ref 0.00–0.07)
Basophils Absolute: 0 K/uL (ref 0.0–0.1)
Basophils Relative: 0 %
Eosinophils Absolute: 0.6 K/uL — ABNORMAL HIGH (ref 0.0–0.5)
Eosinophils Relative: 6 %
HCT: 31.1 % — ABNORMAL LOW (ref 39.0–52.0)
Hemoglobin: 10.7 g/dL — ABNORMAL LOW (ref 13.0–17.0)
Immature Granulocytes: 1 %
Lymphocytes Relative: 22 %
Lymphs Abs: 2.3 K/uL (ref 0.7–4.0)
MCH: 32.7 pg (ref 26.0–34.0)
MCHC: 34.4 g/dL (ref 30.0–36.0)
MCV: 95.1 fL (ref 80.0–100.0)
Monocytes Absolute: 1.1 K/uL — ABNORMAL HIGH (ref 0.1–1.0)
Monocytes Relative: 11 %
Neutro Abs: 6.7 K/uL (ref 1.7–7.7)
Neutrophils Relative %: 60 %
Platelets: 420 K/uL — ABNORMAL HIGH (ref 150–400)
RBC: 3.27 MIL/uL — ABNORMAL LOW (ref 4.22–5.81)
RDW: 12.1 % (ref 11.5–15.5)
WBC: 10.8 K/uL — ABNORMAL HIGH (ref 4.0–10.5)
nRBC: 0 % (ref 0.0–0.2)

## 2024-03-29 LAB — COMPREHENSIVE METABOLIC PANEL WITH GFR
ALT: 12 U/L (ref 0–44)
AST: 18 U/L (ref 15–41)
Albumin: 4 g/dL (ref 3.5–5.0)
Alkaline Phosphatase: 78 U/L (ref 38–126)
Anion gap: 9 (ref 5–15)
BUN: 13 mg/dL (ref 6–20)
CO2: 25 mmol/L (ref 22–32)
Calcium: 9.1 mg/dL (ref 8.9–10.3)
Chloride: 104 mmol/L (ref 98–111)
Creatinine, Ser: 0.72 mg/dL (ref 0.61–1.24)
GFR, Estimated: >60 mL/min
Glucose, Bld: 102 mg/dL — ABNORMAL HIGH (ref 70–99)
Potassium: 4.1 mmol/L (ref 3.5–5.1)
Sodium: 139 mmol/L (ref 135–145)
Total Bilirubin: 0.7 mg/dL (ref 0.0–1.2)
Total Protein: 6.3 g/dL — ABNORMAL LOW (ref 6.5–8.1)

## 2024-03-29 MED ORDER — LISINOPRIL 5 MG PO TABS
5.0000 mg | ORAL_TABLET | Freq: Every day | ORAL | Status: DC
  Administered 2024-03-30 – 2024-04-12 (×14): 5 mg via ORAL
  Filled 2024-03-29 (×7): qty 1

## 2024-03-29 MED ORDER — GUAIFENESIN ER 600 MG PO TB12
600.0000 mg | ORAL_TABLET | Freq: Two times a day (BID) | ORAL | Status: DC
  Administered 2024-03-29 – 2024-04-12 (×28): 600 mg via ORAL
  Filled 2024-03-29 (×14): qty 1

## 2024-03-29 NOTE — Patient Care Conference (Signed)
 Inpatient RehabilitationTeam Conference and Plan of Care Update Date: 03/29/2024   Time: 1012 am     Patient Name: Isaac Meza      Medical Record Number: [id number]  Date of Birth: [birth year] Sex: Male         Room/Bed: 4M03C/4M03C-01 Payor Info: Payor: [place] MEDICAID PREPAID HEALTH PLAN / Plan: [place] MEDICAID HEALTHY BLUE / Product Type: *No Product type* /    Admit Date/Time:  03/28/2024  3:56 PM  Primary Diagnosis:  Infarction of left basal ganglia [facility])  Hospital Problems: Principal Problem:   Infarction of left basal ganglia [facility])    Expected Discharge Date: Expected Discharge Date: 04/12/24  Team Members Present: Physician leading conference: Dr. Joesph Likes Social Worker Present: Rhoda Clement, LCSW Nurse Present: Eulalio Falls, RN PT Present: Kirt Dawn, PT OT Present: Nereida Habermann, OT SLP Present: Joane Fuss, SLP Other (Discipline and Name): Cara Daring  , ADN     Current Status/Progress Goal Weekly Team Focus  Bowel/Bladder   PT IS CONTINENT OF B&B   PT WILL REMAIN CONTINENT OF B&B   PT WILL BE PROVIDED ASSISTANCE WITH ELIMINATION NEEDS IF NEEDED    Swallow/Nutrition/ Hydration               ADL's   Eval pending   Eval pending   Eval pending    Mobility   supervision bed mobility, modA transfers, modA gait x30' with handrail (+2 WC follow), maxA x8 steps   Supervision  Rt hemibody NMR, balance, transfes, ambulation    Communication                Safety/Cognition/ Behavioral Observations               Pain   PT DENIES PAIN OR ANY DISCOMFORT   PT WILL CONTINUE TO BE FREE OF PAIN AND DISCOMFORT   PT WILL BE ASSESSED AND MEDICATED FOR C/O PAIN OR DISCOMFORT. PAIN WILL BE LESS THAN 3 ON THE PAIN SCALE    Skin   SKIN IS INTACT WITH NO ALTERATION IN SKIN INTEGRITY   PT SKIN WILL REMAIN INTACT WITH NO ALTERATIONS IN SKIN INTEGRITY  ENSURE THAT PT SKIN REMAINS CLEAN AND INTACT, PT WILL TURN AND REPOSITION q 2 HRS FOR COMFORT AND  SKIN INTEGRITY      Discharge Planning:  New evaluation today-home with wife who works and her parents are there during the day to assist while wife is working, will have 24/7 care    Team Discussion: Patient was admitted post left radiata and basal ganglia infarct with right hemiplegia, dysarthria and aphasia. Patient  on hypertension protocol to gradually resume medications and monitor blood pressure with therapies. Patient with poor po intake.   Patient on target to meet rehab goals: Pending eval with ADLs. Currently,  patient needs mod assistance with transfers. Patient was able to ambulate up to 42' with mod assistance using handrail +2 wheelchair follow. Goals set for supervision assistance.  *See Care Plan and progress notes for long and short-term goals.   Revisions to Treatment Plan:  N/a   Teaching Needs: Safety, medications, smoking cessation, ETOH education, transfers, toileting, etc.   Current Barriers to Discharge: Decreased caregiver support and Home enviroment access/layout  Possible Resolutions to Barriers: Family Education     Medical Summary Current Status: Medically complicated by CVA, hypertension, anemia, febrile cytosis, poorly controlled pain, poor p.o. intakes, and bowel incontinence  Barriers to Discharge: Inadequate Nutritional Intake;Incontinence;Medical stability;Self-care education;Uncontrolled Hypertension;Uncontrolled Pain   Possible  Resolutions to [employer]: Titrate pain medications for functional improvements, gradually resume antihypertensives, monitor toileting with resumption of therapies, workup for worsening anemia on Brilinta /aspirin    Continued Need for Acute Rehabilitation Level of Care: The patient requires daily medical management by a physician with specialized training in physical medicine and rehabilitation for the following reasons: Direction of a multidisciplinary physical rehabilitation program to maximize functional  independence : Yes Medical management of patient stability for increased activity during participation in an intensive rehabilitation regime.: Yes Analysis of laboratory values and/or radiology reports with any subsequent need for medication adjustment and/or medical intervention. : Yes   I attest that I was present, lead the team conference, and concur with the assessment and plan of the team.   Omayra Tulloch Gayo 03/29/2024, 1012 am

## 2024-03-29 NOTE — Progress Notes (Signed)
 Inpatient Rehabilitation  Patient information reviewed and entered into eRehab system by [facility]. Karen Kays., CCC/SLP, PPS Coordinator.  Information including medical coding, functional ability and quality indicators will be reviewed and updated through discharge.

## 2024-03-29 NOTE — Progress Notes (Signed)
 Inpatient Rehabilitation Center Individual Statement of Services  Patient Name:  Isaac Meza  Date:  03/29/2024  Welcome to the Inpatient Rehabilitation Center.  Our goal is to provide you with an individualized program based on your diagnosis and situation, designed to meet your specific needs.  With this comprehensive rehabilitation program, you will be expected to participate in at least 3 hours of rehabilitation therapies Monday-Friday, with modified therapy programming on the weekends.  Your rehabilitation program will include the following services:  Physical Therapy (PT), Occupational Therapy (OT), Speech Therapy (ST), 24 hour per day rehabilitation nursing, Therapeutic Recreaction (TR), Neuropsychology, Care Coordinator, Rehabilitation Medicine, Nutrition Services, and Pharmacy Services  Weekly team conferences will be held on Tuesday to discuss your progress.  Your Inpatient Rehabilitation Care Coordinator will talk with you frequently to get your input and to update you on team discussions.  Team conferences with you and your family in attendance may also be held.  Expected length of stay: 14 days  Overall anticipated outcome: supervision with cues  Depending on your progress and recovery, your program may change. Your Inpatient Rehabilitation Care Coordinator will coordinate services and will keep you informed of any changes. Your Inpatient Rehabilitation Care Coordinator's name and contact numbers are listed  below.  The following services may also be recommended but are not provided by the Inpatient Rehabilitation Center:  Driving Evaluations Home Health Rehabiltiation Services Outpatient Rehabilitation Services Vocational Rehabilitation   Arrangements will be made to provide these services after discharge if needed.  Arrangements include referral to agencies that provide these services.  Your insurance has been verified to be:  Health[employer] [place] scientific Your primary doctor is:   Almarie Fischer  Pertinent information will be shared with your doctor and your insurance company.  Inpatient Rehabilitation Care Coordinator:  Graeme Jude, KEN [phone] or ([phone]  Information discussed with and copy given to patient by: [name], 03/29/2024, 8:52 AM

## 2024-03-29 NOTE — Progress Notes (Addendum)
 " Inpatient Rehabilitation Care Coordinator Assessment and Plan Patient Details  Name: Isaac Meza MRN: [id number] Date of Birth: 1970/10/23  Today's Date: 03/29/2024  Hospital Problems: Principal Problem:   Infarction of left basal ganglia [facility])  Past Medical History:  Past Medical History:  Diagnosis Date   Asthma    childhood   Hypertension    Past Surgical History:  Past Surgical History:  Procedure Laterality Date   APPENDECTOMY  2024   BACK SURGERY     ESOPHAGOGASTRODUODENOSCOPY (EGD) WITH PROPOFOL  N/A 08/01/2022   Procedure: ESOPHAGOGASTRODUODENOSCOPY (EGD) WITH PROPOFOL ;  Surgeon: Rollin Dover, MD;  Location: [facility] ENDOSCOPY;  Service: Gastroenterology;  Laterality: N/A;   EUS N/A 08/01/2022   Procedure: UPPER ENDOSCOPIC ULTRASOUND (EUS) LINEAR;  Surgeon: Rollin Dover, MD;  Location: [facility] ENDOSCOPY;  Service: Gastroenterology;  Laterality: N/A;   FINE NEEDLE ASPIRATION  08/01/2022   Procedure: FINE NEEDLE ASPIRATION (FNA) LINEAR;  Surgeon: Rollin Dover, MD;  Location: [facility] ENDOSCOPY;  Service: Gastroenterology;;   FRACTURE SURGERY Right 2007   arm   XI ROBOTIC LAPAROSCOPIC ASSISTED APPENDECTOMY N/A 05/16/2022   Procedure: XI ROBOTIC LAPAROSCOPIC ASSISTED APPENDECTOMY;  Surgeon: Desiderio Schanz, MD;  Location: [facility] ORS;  Service: General;  Laterality: N/A;   Social History:  reports that he has been smoking cigarettes. He started smoking about 40 years ago. He has a 20.3 pack-year smoking history. He has never used smokeless tobacco. He reports current alcohol use of about 21.0 standard drinks of alcohol per week. He reports that he does not currently use drugs.  Family / Support Systems Marital Status: Married Patient Roles: Spouse, Other (Comment) (employee/son in-law) Spouse/Significant Other: Stephane [phone] Children: Wife's daughter out of town Other Supports: Wife's parents and pt's dad Anticipated Caregiver: Wife and in-laws whom live with them Ability/Limitations  of Caregiver: Wife works days but is flexible and in-laws can assist are there when wife works Engineer, Structural Availability: 24/7 Family Dynamics: Close knit with families and feel have good supports via family and friends. All will pull together to assist one another and pt with his needs  Social History Preferred language: English Religion: None Cultural Background: NA Education: HS Health Literacy - How often do you need to have someone help you when you read instructions, pamphlets, or other written material from your doctor or pharmacy?: Never Writes: Yes Employment Status: Employed Name of Employer: Two jobs-[employer]-full time and another one Return to Work Plans: Will need to recover from CVA before can return to work Marine Scientist Issues: NA Guardian/Conservator: None-according to MD pt is capable of making his own decisions while here. Due to his speech/language deficits will include wife also   Abuse/Neglect Abuse/Neglect Assessment Can Be Completed: Yes Physical Abuse: Denies Verbal Abuse: Denies Sexual Abuse: Denies Exploitation of patient/patient's resources: Denies Self-Neglect: Denies  Patient response to: Social Isolation - How often do you feel lonely or isolated from those around you?: Never  Emotional Status Pt's affect, behavior and adjustment status: Pt is motivated to do well and has always been independent he is not one to sit still and has always worked. He communicates with thumbs up and down and can say a few words.  Wife provided the information and was present in his room Recent Psychosocial Issues: other health issues Psychiatric History: No issues due to young age and substance issues will place on neuro-psych list to be seen while here when appropriate speech wise Substance Abuse History: ETOH and tobacco aware of the need to quit due to health  issues and prevention of another CVA  Patient / Family Perceptions, Expectations & Goals Pt/Family  understanding of illness & functional limitations: Pt and wife seem to have a good understanding of his stroke and deficits, both do talk with the MD's involved and feel understand his treatment plan moving forward. Premorbid pt/family roles/activities: husband, son, employee, son in-law, etc Anticipated changes in roles/activities/participation: resume Pt/family expectations/goals: Pt is aware of what is being said just can not put into words, does give thumbs up or down. Wife states:  I hope he does well here and makes a lot of progress.  Community Resources [employer]: None Premorbid Home Care/DME Agencies: None Transportation available at discharge: self and wife Is the patient able to respond to transportation needs?: Yes In the past 12 months, has lack of transportation kept you from medical appointments or from getting medications?: No In the past 12 months, has lack of transportation kept you from meetings, work, or from getting things needed for daily living?: No Resource referrals recommended: Neuropsychology  Discharge Planning Living Arrangements: Spouse/significant other, Other relatives Support Systems: Spouse/significant other, Parent, Other relatives, Friends/neighbors Type of Residence: Private residence Insurance Resources: [employer] (specify) (Healthy [place] medicaid) Surveyor, Quantity Resources: Employment, Garment/textile Technologist Screen Referred: No Living Expenses: Psychologist, Sport And Exercise Management: Patient, Spouse Does the patient have any problems obtaining your medications?: No Home Management: all family help one another Patient/Family Preliminary Plans: Return home with wife and in-laws whom live with them. Wife does work day shift but is flexible but while she is working her parents can assist pt if needed. Aware being evaluated today and goals being set for stay here. Care Coordinator Barriers to Discharge: Insurance for SNF coverage, Medication compliance Care  Coordinator Anticipated Follow Up Needs: HH/OP  Clinical Impression Pleasant couple, pt is motivated to do well and recover from this stroke. Between wife, his dad and her parents he will have 24/7 care if needed at discharge. Have placed in neuro-psych to be seen when appropriate due to speech issues. Aware team conference on Tuesday and probably will not be able to give target discharge date today. Covering for [place] once returns will take case back  [name] 03/29/2024, 8:49 AM    "

## 2024-03-29 NOTE — Evaluation (Signed)
 Speech Language Pathology Assessment and Plan  Patient Details  Name: Isaac Meza MRN: [id number] Date of Birth: Feb 18, 1971  SLP Diagnosis: Cognitive Impairments;Dysarthria  Rehab Potential: Excellent ELOS: 14 days   Today's Date: 03/29/2024 SLP Individual Time: 1100-1155 SLP Individual Time Calculation (min): 55 min   Hospital Problem: Principal Problem:   Infarction of left basal ganglia (HCC)  Past Medical History:  Past Medical History:  Diagnosis Date   Asthma    childhood   Hypertension    Past Surgical History:  Past Surgical History:  Procedure Laterality Date   APPENDECTOMY  2024   BACK SURGERY     ESOPHAGOGASTRODUODENOSCOPY (EGD) WITH PROPOFOL  N/A 08/01/2022   Procedure: ESOPHAGOGASTRODUODENOSCOPY (EGD) WITH PROPOFOL ;  Surgeon: Rollin Dover, MD;  Location: [facility] ENDOSCOPY;  Service: Gastroenterology;  Laterality: N/A;   EUS N/A 08/01/2022   Procedure: UPPER ENDOSCOPIC ULTRASOUND (EUS) LINEAR;  Surgeon: Rollin Dover, MD;  Location: [facility] ENDOSCOPY;  Service: Gastroenterology;  Laterality: N/A;   FINE NEEDLE ASPIRATION  08/01/2022   Procedure: FINE NEEDLE ASPIRATION (FNA) LINEAR;  Surgeon: Rollin Dover, MD;  Location: [facility] ENDOSCOPY;  Service: Gastroenterology;;   FRACTURE SURGERY Right 2007   arm   XI ROBOTIC LAPAROSCOPIC ASSISTED APPENDECTOMY N/A 05/16/2022   Procedure: XI ROBOTIC LAPAROSCOPIC ASSISTED APPENDECTOMY;  Surgeon: Desiderio Schanz, MD;  Location: [facility] ORS;  Service: General;  Laterality: N/A;    Assessment / Plan / Recommendation Clinical Impression  Isaac Meza is a 53 year old right-handed male with history of childhood asthma hypertension and tobacco/alcohol use. Per chart review patient lives with spouse as well as in-laws. Wife works during the day. Two-level home bed and bath main level with 2 steps to entry. Independent prior to admission and working. Presented 03/17/2024 with of right sided weakness and slurred speech with ataxic gait x 2 days.  MRI of the brain showed patchy acute small vessel ischemic type infarcts in the left corona radiata tracking towards the posterior left lentiform. Echocardiogram showed ejection fraction of 60 to 65% no PFO identified. CT angiogram with no severe stenosis noted. Patient was placed on aspirin  and Plavix  for CVA prophylaxis x 3 weeks then aspirin  alone and was discharged to home on 03/19/2024. Patient was doing well until the morning of 03/23/2024 with increasing weakness right side weakness and worsening slurred speech. Repeat CT/MRI showed interval expansion and worsening and recently identified posterior left basal ganglia infarct now measuring up to 2.9 cm in size. No associated hemorrhage or significant regional mass effect. 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 Neurology follow-up currently maintained on aspirin  81 mg daily and Brilinta  90 mg twice daily x 4 weeks then aspirin  alone. Tolerating a regular diet. Therapy evaluations completed due to patient's decreased functional mobility right side weakness was admitted for a comprehensive rehab program. Pt was admitted to CIR on 03/28/24.  Cognitive/ Linguistic: Pt presents with a mild cognitive linguistic deficit. COGNISTAT administered revealing strengths in orientation, attention, constructional ability, and memory. Pt presented with mild deficits in executive function. Informally, pt demonstrated some impulsivity and reduced safety awareness during requested transfer back to bed at conclusion of session. Pt warranting min to mod cues in the moment for awareness of limitations. Otherwise, pt verbalized his recent medical hx along with PLOF. 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 He did not manage medications though he did manage his own finances. Pt would benefit from skilled intervention addressing moderate complexity problem solving.  Dysarthria: Pt presents with a moderate to severe dysarthria. Oral mechanism exam completed revealing right side facial droop, mild tongue deviation to right, reduced right side lingual ROM and strength. Pt also noted with a weak cough. Dysarthria c/b significantly decreased vocal intensity and flat affect. Further deficits include imprecise articulation with overall speech intelligibility ~60% with words, 40% with phrases and 30-40% in sentences.    Swallowing: Brief screen of swallow function completed with thin liquids. Pt consumed thin liquids via straw without overt s/s aspiration. He denies anterior spillage and reports an isolated instance of pocketing which he was able to clear. SLP recommending continuation of regular solids and thin liquids. No follow up intervention warranted at this time.   Pt would benefit from skilled SLP services to maximize dysarthria and cognition in order to maximize his independence prior to discharge. Anticipate pt will require supervision at home and f/u home health or outpatient SLP services.     Skilled Therapeutic Interventions          OME, informal assessment measures, and COGNISTAT administered. Please see full report for additional details.      SLP Assessment  Patient will need skilled Speech Lanaguage Pathology Services during CIR admission    Recommendations  SLP Diet Recommendations: Age appropriate regular solids;Thin Liquid Administration via: Straw Medication Administration: Whole meds with liquid Compensations: Slow rate;Lingual sweep for clearance of pocketing Postural Changes and/or Swallow Maneuvers: Seated upright 90 degrees Oral Care Recommendations: Oral care BID Patient destination: Home Follow up Recommendations: Outpatient SLP;Home Health SLP Equipment Recommended: None recommended by SLP    SLP Frequency 3 to 5 out of 7 days   SLP Duration  SLP Intensity  SLP Treatment/Interventions 14 days  Minumum of 1-2 x/day, 30 to 90  minutes  Cognitive remediation/compensation;Speech/Language facilitation;Internal/external aids;Patient/family education;Therapeutic Exercise;Therapeutic Activities;Environmental controls;Functional tasks    Pain Pain Assessment Pain Scale: 0-10 Pain Score: 0-No pain Pain Type: Acute pain Pain Location: Hip Pain Orientation: Right Pain Descriptors / Indicators: Aching Pain Frequency: Intermittent Pain Intervention(s): Medication (See eMAR)  Prior Functioning Cognitive/Linguistic Baseline: Within functional limits Type of Home: House  Lives With: Spouse;Family Available Help at Discharge: Family;Available 24 hours/day Education: GED Vocation: Full time employment  SLP Evaluation Cognition Overall Cognitive Status: Impaired/Different from baseline Arousal/Alertness: Awake/alert Orientation Level: Oriented X4 Year: 2025 Month: December Day of Week: Correct Attention: Focused;Sustained Focused Attention: Appears intact Sustained Attention: Appears intact Memory: Appears intact Awareness: Impaired Awareness Impairment: Intellectual impairment Problem Solving: Impaired Problem Solving Impairment: Verbal basic;Functional basic Executive Function: Decision Making Decision Making: Impaired Decision Making Impairment: Verbal complex;Functional complex Behaviors: Impulsive Safety/Judgment: Impaired  Comprehension Auditory Comprehension Overall Auditory Comprehension: Appears within functional limits for tasks assessed Expression Expression Primary Mode of Expression: Verbal Verbal Expression Overall Verbal Expression: Appears within functional limits for tasks assessed Automatic Speech: Name Written Expression Dominant Hand: Right Oral Motor Oral Motor/Sensory Function Overall Oral Motor/Sensory Function: Moderate impairment Facial ROM: Reduced right Facial Symmetry: Abnormal symmetry right Facial Strength: Reduced right Facial Sensation: Within Functional  Limits Lingual ROM: Reduced right Lingual Symmetry: Abnormal symmetry right Lingual Strength: Reduced Motor Speech Overall Motor Speech: Impaired Respiration: Within functional limits Phonation: Low vocal intensity;Breathy Articulation: Impaired Level of Impairment: Word Intelligibility: Intelligibility reduced Word: 75-100% accurate Phrase: 50-74% accurate Sentence: 50-74% accurate Conversation: 25-49% accurate Motor Planning: Within functional limits Motor Speech Errors: Aware Effective Techniques: Slow rate;Increased vocal intensity;Over-articulate;Pause  Care Tool Care Tool Cognition Ability to hear (with hearing aid or hearing appliances if normally used Ability  to hear (with hearing aid or hearing appliances if normally used): 0. Adequate - no difficulty in normal conservation, social interaction, listening to TV   Expression of Ideas and Wants Expression of Ideas and Wants: 2. Frequent difficulty - frequently exhibits difficulty with expressing needs and ideas   Understanding Verbal and Non-Verbal Content Understanding Verbal and Non-Verbal Content: 4. Understands (complex and basic) - clear comprehension without cues or repetitions  Memory/Recall Ability Memory/Recall Ability : Current season;That he or she is in a hospital/hospital unit   Bedside Swallowing Assessment General Temperature Spikes Noted: No Respiratory Status: Room air Behavior/Cognition: Alert Oral Cavity - Dentition: Adequate natural dentition Self-Feeding Abilities: Needs set up;Able to feed self Patient Positioning: Upright in bed Baseline Vocal Quality: Breathy;Low vocal intensity Volitional Cough: Weak Volitional Swallow: Able to elicit  Oral Care Assessment Oral Assessment  (WDL): Exceptions to WDL Lips: Asymmetrical Teeth: Intact Tongue: Pink Mucous Membrane(s): Moist Saliva: Moist, saliva free flowing Level of Consciousness: Alert Is patient on any of following O2 devices?: None of the  above Nutritional status: No high risk factors Oral Assessment Risk : Low Risk Ice Chips Ice chips: Not tested Thin Liquid Thin Liquid: Within functional limits Presentation: Straw Nectar Thick Nectar Thick Liquid: Not tested Honey Thick Honey Thick Liquid: Not tested Puree Puree: Not tested Solid Solid: Not tested BSE Assessment Risk for Aspiration Impact on safety and function: Mild aspiration risk  Short Term Goals: Week 1: SLP Short Term Goal 1 (Week 1): Patient will demonstrate moderate complexity problem solving given sup multimodal A SLP Short Term Goal 2 (Week 1): Patient will increase speech intelligibility to 60% at the phrase level given min multimodal A SLP Short Term Goal 3 (Week 1): Patient will recall SLOP speech intelligibility strategies with sup A  Refer to Care Plan for Long Term Goals  Recommendations for other services: None   Discharge Criteria: Patient will be discharged from SLP if patient refuses treatment 3 consecutive times without medical reason, if treatment goals not met, if there is a change in medical status, if patient makes no progress towards goals or if patient is discharged from hospital.  The above assessment, treatment plan, treatment alternatives and goals were discussed and mutually agreed upon: by patient  Joane GORMAN Fuss 03/29/2024, 12:32 PM

## 2024-03-29 NOTE — Progress Notes (Signed)
 Orthopedic Tech Progress Note Patient Details:  Isaac Meza 09-26-1970 [id number]  Called in order to [employer] for a WHO   Patient ID: Isaac Meza, male   DOB: May 12, 1970, 53 y.o.   MRN: [id number]  [name] 03/29/2024, 5:13 PM

## 2024-03-29 NOTE — Progress Notes (Signed)
 Met with patient to review current situation, team conference and plan of care. Reviewed DAPT medications aspirin  and brilinta . Reviewed co morbidities HTN , HLD. Educated on smoking cessation, ETOH. Educated on diet and MD follow up. Continue to follow along to provide educational needs to facilitate preparation for discharge.

## 2024-03-29 NOTE — Evaluation (Signed)
 " Physical Therapy Assessment and Plan  Patient Details  Name: Isaac Meza MRN: [id number] Date of Birth: [birth year]  PT Diagnosis: Difficulty walking, Hemiplegia dominant, and Muscle weakness Rehab Potential: Good ELOS: 14-17 Days   Today's Date: 03/29/2024 PT Individual Time: 0845-1000 PT Individual Time Calculation (min): 75 min    Hospital Problem: Principal Problem:   Infarction of left basal ganglia (HCC)   Past Medical History:  Past Medical History:  Diagnosis Date   Asthma    childhood   Hypertension    Past Surgical History:  Past Surgical History:  Procedure Laterality Date   APPENDECTOMY  2024   BACK SURGERY     ESOPHAGOGASTRODUODENOSCOPY (EGD) WITH PROPOFOL  N/A 08/01/2022   Procedure: ESOPHAGOGASTRODUODENOSCOPY (EGD) WITH PROPOFOL ;  Surgeon: Rollin Dover, MD;  Location: [facility] ENDOSCOPY;  Service: Gastroenterology;  Laterality: N/A;   EUS N/A 08/01/2022   Procedure: UPPER ENDOSCOPIC ULTRASOUND (EUS) LINEAR;  Surgeon: Rollin Dover, MD;  Location: [facility] ENDOSCOPY;  Service: Gastroenterology;  Laterality: N/A;   FINE NEEDLE ASPIRATION  08/01/2022   Procedure: FINE NEEDLE ASPIRATION (FNA) LINEAR;  Surgeon: Rollin Dover, MD;  Location: [facility] ENDOSCOPY;  Service: Gastroenterology;;   FRACTURE SURGERY Right 2007   arm   XI ROBOTIC LAPAROSCOPIC ASSISTED APPENDECTOMY N/A 05/16/2022   Procedure: XI ROBOTIC LAPAROSCOPIC ASSISTED APPENDECTOMY;  Surgeon: Desiderio Schanz, MD;  Location: [facility] ORS;  Service: General;  Laterality: N/A;    Assessment & Plan Clinical Impression: Patient is a 53 year old right-handed male with history of childhood asthma hypertension and tobacco/alcohol use. Per chart review patient lives with spouse as well as in-laws. Wife works during the day. Two-level home bed and bath main level with 2 steps to entry. Independent prior to admission and working. Presented 03/17/2024 with of right sided weakness and slurred speech with ataxic gait x 2 days. MRI of  the brain showed patchy acute small vessel ischemic type infarcts in the left corona radiata tracking towards the posterior left lentiform. Echocardiogram showed ejection fraction of 60 to 65% no PFO identified. CT angiogram with no severe stenosis noted. Patient was placed on aspirin  and Plavix  for CVA prophylaxis x 3 weeks then aspirin  alone and was discharged to home on 03/19/2024. Patient was doing well until the morning of 03/23/2024 with increasing weakness right side weakness and worsening slurred speech. Repeat CT/MRI showed interval expansion and worsening and recently identified posterior left basal ganglia infarct now measuring up to 2.9 cm in size. No associated hemorrhage or significant regional mass effect. 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 Neurology follow-up currently maintained on aspirin  81 mg daily and Brilinta  90 mg twice daily x 4 weeks then aspirin  alone. Tolerating a regular diet.   Patient transferred to CIR on 03/28/2024 .   Patient currently requires mod with mobility secondary to muscle weakness, decreased cardiorespiratoy endurance, decreased coordination, and decreased sitting balance, decreased standing balance, decreased postural control, hemiplegia, and decreased balance strategies.  Prior to hospitalization, patient was independent  with mobility and lived with Spouse, Family in a House home.  Home access is 2Stairs to enter.  Patient will benefit from skilled PT intervention to maximize safe functional mobility, minimize fall risk, and decrease caregiver burden for planned discharge home with 24 hour supervision.  Anticipate patient will benefit from follow up OP at discharge.  PT - End of Session Activity Tolerance: Tolerates 30+ min activity with multiple rests PT Assessment Rehab Potential (ACUTE/IP ONLY): Good PT Patient demonstrates  impairments in the following area(s):  Balance;Endurance;Motor;Safety PT Transfers Functional Problem(s): Bed Mobility;Bed to Chair;Car;Furniture PT Locomotion Functional Problem(s): Ambulation;Wheelchair Mobility;Stairs PT Plan PT Intensity: Minimum of 1-2 x/day ,45 to 90 minutes PT Frequency: 5 out of 7 days PT Duration Estimated Length of Stay: 14-17 Days PT Treatment/Interventions: Ambulation/gait training;Community reintegration;DME/adaptive equipment instruction;Neuromuscular re-education;Stair training;Psychosocial support;UE/LE Strength taining/ROM;Wheelchair propulsion/positioning;Balance/vestibular training;Functional electrical stimulation;Discharge planning;Pain management;Skin care/wound management;Therapeutic Activities;UE/LE Coordination activities;Cognitive remediation/compensation;Disease management/prevention;Functional mobility training;Patient/family education;Splinting/orthotics;Therapeutic Exercise;Visual/perceptual remediation/compensation PT Transfers Anticipated Outcome(s): Supervision PT Locomotion Anticipated Outcome(s): Supervision PT Recommendation Recommendations for Other Services: Therapeutic Recreation consult Therapeutic Recreation Interventions: Stress management Follow Up Recommendations: Outpatient PT Patient destination: Home Equipment Recommended: To be determined   PT Evaluation Precautions/Restrictions Precautions Precautions: Fall Restrictions Weight Bearing Restrictions Per Provider Order: No General Chart Reviewed: Yes Family/Caregiver Present: Yes  Pain Interference Pain Interference Pain Effect on Sleep: 1. Rarely or not at all Pain Interference with Therapy Activities: 1. Rarely or not at all Pain Interference with Day-to-Day Activities: 1. Rarely or not at all Home Living/Prior Functioning Home Living Living Arrangements: Spouse/significant other;Other relatives Available Help at Discharge: Family;Available 24 hours/day Type of Home: House Home Access: Stairs to  enter [employer] of Steps: 2 Entrance Stairs-Rails: None Home Layout: Two level;Able to live on main level with bedroom/bathroom Bathroom Shower/Tub: [employer]: Standard  Lives With: Spouse;Family Prior Function Level of Independence: Independent with transfers;Independent with gait;Independent with homemaking with ambulation  Able to Take Stairs?: Yes Driving: Yes Vocation: Full time employment Vision/Perception  Vision - History Ability to See in Adequate Light: 0 Adequate Perception Perception: Within Functional Limits Praxis Praxis: WFL  Cognition Orientation Level: Oriented X4 Sensation Sensation Light Touch: Appears Intact Coordination Gross Motor Movements are Fluid and Coordinated: No Fine Motor Movements are Fluid and Coordinated: No Coordination and Movement Description: Rt hemiplegia Motor  Motor Motor: Hemiplegia Motor - Skilled Clinical Observations: Rt hemiplegia  Trunk/Postural Assessment  Cervical Assessment Cervical Assessment: Within Functional Limits Thoracic Assessment Thoracic Assessment: Within Functional Limits Lumbar Assessment Lumbar Assessment: Within Functional Limits Postural Control Postural Control: Deficits on evaluation (delayed)  Balance Balance Balance Assessed: Yes Static Sitting Balance Static Sitting - Balance Support: Feet supported Static Sitting - Level of Assistance: 5: Stand by assistance Dynamic Sitting Balance Dynamic Sitting - Balance Support: During functional activity Dynamic Sitting - Level of Assistance: 5: Stand by assistance (SUP-CGA) Static Standing Balance Static Standing - Balance Support: During functional activity;Left upper extremity supported Static Standing - Level of Assistance: 4: Min assist Dynamic Standing Balance Dynamic Standing - Balance Support: During functional activity;No upper extremity supported Dynamic Standing - Level of Assistance: 3: Mod  assist Extremity Assessment  RLE Strength Right Hip Flexion: 2/5 Right Hip Extension: 2/5 Right Knee Extension: 2/5 Right Ankle Dorsiflexion: 0/5 Right Ankle Plantar Flexion: 2/5 LLE Assessment LLE Assessment: Within Functional Limits  Care Tool Care Tool Bed Mobility Roll left and right activity   Roll left and right assist level: Supervision/Verbal cueing    Sit to lying activity   Sit to lying assist level: Contact Guard/Touching assist    Lying to sitting on side of bed activity   Lying to sitting on side of bed assist level: the ability to move from lying on the back to sitting on the side of the bed with no back support.: Contact Guard/Touching assist     Care Tool Transfers Sit to stand transfer   Sit to stand assist level: Moderate Assistance - Patient 50 - 74%  Chair/bed transfer   Chair/bed transfer assist level: Moderate Assistance - Patient 50 - 74%    Car transfer   Car transfer assist level: Moderate Assistance - Patient 50 - 74%      Care Tool Locomotion Ambulation   Assist level: 2 helpers Assistive device:  (Lt handrail) Max distance: 30'  Walk 10 feet activity   Assist level: 2 helpers Assistive device:  (Left handrail)   Walk 50 feet with 2 turns activity Walk 50 feet with 2 turns activity did not occur: Safety/medical concerns      Walk 150 feet activity Walk 150 feet activity did not occur: Safety/medical concerns      Walk 10 feet on uneven surfaces activity Walk 10 feet on uneven surfaces activity did not occur: Safety/medical concerns      Stairs   Assist level: Maximal Assistance - Patient 25 - 49% Stairs assistive device: 1 hand rail Max number of stairs: 8  Walk up/down 1 step activity   Walk up/down 1 step (curb) assist level: Maximal Assistance - Patient 25 - 49% Walk up/down 1 step or curb assistive device: 1 hand rail  Walk up/down 4 steps activity   Walk up/down 4 steps assist level: Maximal Assistance - Patient 25 -  49% Walk up/down 4 steps assistive device: 1 hand rail  Walk up/down 12 steps activity Walk up/down 12 steps activity did not occur: Safety/medical concerns      Pick up small objects from floor   Pick up small object from the floor assist level: Maximal Assistance - Patient 25 - 49%    Wheelchair Is the patient using a wheelchair?: Yes Type of Wheelchair: Manual   Wheelchair assist level: Dependent - Patient 0% Max wheelchair distance: 150'  Wheel 50 feet with 2 turns activity   Assist Level: Dependent - Patient 0%  Wheel 150 feet activity   Assist Level: Dependent - Patient 0%    Refer to Care Plan for Long Term Goals  SHORT TERM GOAL WEEK 1 PT Short Term Goal 1 (Week 1): Pt will complete sit to stand transfer with CGA consistently. PT Short Term Goal 2 (Week 1): Pt will complete bed to chair transfer with minA consistently. PT Short Term Goal 3 (Week 1): Pt will ambulate x50' with minA and LRAD. PT Short Term Goal 4 (Week 1): Pt will complete x2 steps with LHR and minA.  Recommendations for other services: Therapeutic Recreation  Stress management  Skilled Therapeutic Intervention  Evaluation completed (see details above and below) with education on PT POC and goals and individual treatment initiated with focus on bed mobility, balance, transfers, stair training, and ambulation. Pt received semi reclined in bed and agrees therapy. No complaint of pain. Pt performs supine to sit with cues for sequencing and positioning. Stand pivot transfer to [facility] with modA and cues for initiation, weight shifting, and sequencing. WC transport to gym. Pt completes car transfer with modA and no AD, with similar cues. Seated rest break. Pt performs sit to stand using Lt handrail, then completes forward foot taps with LLE to promote RLE weight shifting and loading for NMR. PT provides tactile facilitation of Rt knee extension, as well as activation of Rt hip extensors and abductors. Following rest break,  pt ambulates x30' with Lt handrail and modA, with +2 WC follow for safety. PT manually facilitates placement of RLE, though pt is able to initiate swing phase with each gait cycle. Cues also provided for neutral posture and sequencing of  each gait cycle. Seated rest break. Pt completes x8 6 steps with Lt handrail and maxA, with pt moving quickly and buckling through Rt knee several times. PT provides education on safety and ensuring that pt completes each task slowly and following PT cues. Following, pt performs foot tapping activity in front of mirror for visual feedback. Pt tasked with tapping LLE on colored circle to promote RLE weightbearing and stance control. PT provides blocking of Rt knee and facilitation of weight shifting and Rt hip extension and abductor engagement. Pt requires modA overall for balance. Pt progresses by tapping Lt foot on 3 inch step in front of hip and on colored circle Lt and lateral to pt to promote increased hip abductor recruitment. WC transport back to room. Left seated with all needs within reach,   Mobility Bed Mobility Bed Mobility: Supine to Sit;Sit to Supine Supine to Sit: Contact Guard/Touching assist Sit to Supine: Contact Guard/Touching assist Transfers Transfers: Sit to Stand;Stand to Sit;Stand Pivot Transfers Sit to Stand: Moderate Assistance - Patient 50-74% Stand to Sit: Moderate Assistance - Patient 50-74% Stand Pivot Transfers: Moderate Assistance - Patient 50 - 74% Stand Pivot Transfer Details: Verbal cues for sequencing;Verbal cues for technique;Tactile cues for posture;Tactile cues for placement;Tactile cues for weight shifting;Tactile cues for sequencing Transfer (Assistive device): None Locomotion  Gait Ambulation: Yes Gait Assistance: 2 Helpers Assistive device:  (Lt handrail) Gait Assistance Details: Verbal cues for technique;Verbal cues for gait pattern;Verbal cues for sequencing;Tactile cues for placement;Tactile cues for posture;Tactile  cues for sequencing;Tactile cues for weight shifting;Tactile cues for weight beaing Gait Gait: Yes Gait Pattern: Impaired Gait Pattern:  (Rt hemi gait) Gait velocity: decr Stairs / Additional Locomotion Stairs: Yes Stairs Assistance: Maximal Assistance - Patient 25 - 49% Stair Management Technique: One rail Left Height of Stairs: 6 Curb: Maximal Assistance - Patient 25 - 49% Wheelchair Mobility Wheelchair Mobility: No   Discharge Criteria: Patient will be discharged from PT if patient refuses treatment 3 consecutive times without medical reason, if treatment goals not met, if there is a change in medical status, if patient makes no progress towards goals or if patient is discharged from hospital.  The above assessment, treatment plan, treatment alternatives and goals were discussed and mutually agreed upon: by patient and by family  Elsie JAYSON Dawn, PT, DPT 03/29/2024, 4:10 PM    "

## 2024-03-29 NOTE — Plan of Care (Signed)
" °  Problem: Consults Goal: RH STROKE PATIENT EDUCATION Description: See Patient Education module for education specifics  Outcome: Progressing   Problem: RH BOWEL ELIMINATION Goal: RH STG MANAGE BOWEL WITH ASSISTANCE Description: STG Manage Bowel with supervision Assistance. Outcome: Progressing   Problem: RH BLADDER ELIMINATION Goal: RH STG MANAGE BLADDER WITH ASSISTANCE Description: STG Manage Bladder With supervision Assistance Outcome: Progressing   Problem: RH SKIN INTEGRITY Goal: RH STG SKIN FREE OF INFECTION/BREAKDOWN Description: Manage skin free of infection with supervision assistance Outcome: Progressing   Problem: RH SAFETY Goal: RH STG ADHERE TO SAFETY PRECAUTIONS W/ASSISTANCE/DEVICE Description: STG Adhere to Safety Precautions With Assistance/Device. Outcome: Progressing   Problem: RH PAIN MANAGEMENT Goal: RH STG PAIN MANAGED AT OR BELOW PT'S PAIN GOAL Description: <45 w/ prns Outcome: Progressing   Problem: RH KNOWLEDGE DEFICIT Goal: RH STG INCREASE KNOWLEDGE OF HYPERTENSION Description: Manage increase  knowledge of hypertension from wife and family with supervision assistance using educational materials provided Outcome: Progressing Goal: RH STG INCREASE KNOWLEGDE OF HYPERLIPIDEMIA Description: Manage increase  knowledge of hyperlipidemia from wife and family with supervision assistance using educational materials provided Outcome: Progressing Goal: RH STG INCREASE KNOWLEDGE OF STROKE PROPHYLAXIS Description: Manage increase  knowledge of stroke prophylaxis from wife and family with supervision assistance using educational materials provided Outcome: Progressing   "

## 2024-03-29 NOTE — Evaluation (Signed)
 Occupational Therapy Assessment and Plan  Patient Details  Name: Isaac Meza MRN: [id number] Date of Birth: November 11, 1970  OT Diagnosis: flaccid hemiplegia and hemiparesis, hemiplegia affecting dominant side, muscle weakness (generalized), and decreased balance strategies and activity tolerance Rehab Potential: Rehab Potential (ACUTE ONLY): Fair ELOS: ~2-3 weeks   Today's Date: 03/29/2024 OT Individual Time: 8694-8584 OT Individual Time Calculation (min): 70 min     Hospital Problem: Principal Problem:   Infarction of left basal ganglia (HCC)   Past Medical History:  Past Medical History:  Diagnosis Date   Asthma    childhood   Hypertension    Past Surgical History:  Past Surgical History:  Procedure Laterality Date   APPENDECTOMY  2024   BACK SURGERY     ESOPHAGOGASTRODUODENOSCOPY (EGD) WITH PROPOFOL  N/A 08/01/2022   Procedure: ESOPHAGOGASTRODUODENOSCOPY (EGD) WITH PROPOFOL ;  Surgeon: Rollin Dover, MD;  Location: [facility] ENDOSCOPY;  Service: Gastroenterology;  Laterality: N/A;   EUS N/A 08/01/2022   Procedure: UPPER ENDOSCOPIC ULTRASOUND (EUS) LINEAR;  Surgeon: Rollin Dover, MD;  Location: [facility] ENDOSCOPY;  Service: Gastroenterology;  Laterality: N/A;   FINE NEEDLE ASPIRATION  08/01/2022   Procedure: FINE NEEDLE ASPIRATION (FNA) LINEAR;  Surgeon: Rollin Dover, MD;  Location: [facility] ENDOSCOPY;  Service: Gastroenterology;;   FRACTURE SURGERY Right 2007   arm   XI ROBOTIC LAPAROSCOPIC ASSISTED APPENDECTOMY N/A 05/16/2022   Procedure: XI ROBOTIC LAPAROSCOPIC ASSISTED APPENDECTOMY;  Surgeon: Desiderio Schanz, MD;  Location: [facility] ORS;  Service: General;  Laterality: N/A;    Assessment & Plan Clinical Impression: Patient is a 53 year old right-handed male with history of childhood asthma hypertension and tobacco/alcohol use. Per chart review patient lives with spouse as well as in-laws. Wife works during the day. Two-level home bed and bath main level with 2 steps to entry. Independent  prior to admission and working. Presented 03/17/2024 with of right sided weakness and slurred speech with ataxic gait x 2 days. MRI of the brain showed patchy acute small vessel ischemic type infarcts in the left corona radiata tracking towards the posterior left lentiform. Echocardiogram showed ejection fraction of 60 to 65% no PFO identified. CT angiogram with no severe stenosis noted. Patient was placed on aspirin  and Plavix  for CVA prophylaxis x 3 weeks then aspirin  alone and was discharged to home on 03/19/2024. Patient was doing well until the morning of 03/23/2024 with increasing weakness right side weakness and worsening slurred speech. Repeat CT/MRI showed interval expansion and worsening and recently identified posterior left basal ganglia infarct now measuring up to 2.9 cm in size. No associated hemorrhage or significant regional mass effect. 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 Neurology follow-up currently maintained on aspirin  81 mg daily and Brilinta  90 mg twice daily x 4 weeks then aspirin  alone. Tolerating a regular diet. Patient transferred to CIR on 03/28/2024 .    Patient currently requires mod A with basic self-care skills secondary to muscle weakness, decreased cardiorespiratoy endurance, impaired timing and sequencing, unbalanced muscle activation, and decreased coordination, decreased safety awareness, and decreased standing balance, decreased postural control, hemiplegia, and decreased balance strategies.  Prior to hospitalization, patient could complete BADLs/IADLs independently.   Patient will benefit from skilled intervention to increase independence with basic self-care skills prior to discharge home with care partner.  Anticipate patient will require 24 hour supervision and follow up outpatient.  OT - End of Session Activity Tolerance: Tolerates 10 - 20 min activity with multiple  rests Endurance Deficit: Yes OT  Assessment Rehab Potential (ACUTE ONLY): Fair OT Barriers to Discharge: Other (comments) OT Barriers to Discharge Comments: Flaccid hemiplegia OT Patient demonstrates impairments in the following area(s): Balance;Endurance;Motor;Safety OT Basic ADL's Functional Problem(s): Eating;Grooming;Bathing;Dressing;Toileting OT Transfers Functional Problem(s): Toilet;Tub/Shower OT Additional Impairment(s): Fuctional Use of Upper Extremity OT Plan OT Intensity: Minimum of 1-2 x/day, 45 to 90 minutes OT Frequency: 5 out of 7 days OT Duration/Estimated Length of Stay: ~2-3 weeks OT Treatment/Interventions: Balance/vestibular training;Community reintegration;Discharge planning;Disease mangement/prevention;DME/adaptive equipment instruction;Functional electrical stimulation;Functional mobility training;Neuromuscular re-education;Pain management;Patient/family education;Psychosocial support;Self Care/advanced ADL retraining;Skin care/wound managment;Splinting/orthotics;Therapeutic Activities;Therapeutic Exercise;UE/LE Strength taining/ROM;UE/LE Coordination activities;Wheelchair propulsion/positioning OT Self Feeding Anticipated Outcome(s): Mod I OT Basic Self-Care Anticipated Outcome(s): Supervision OT Toileting Anticipated Outcome(s): Supervision OT Bathroom Transfers Anticipated Outcome(s): Supervision OT Recommendation Recommendations for Other Services: Neuropsych consult Patient destination: Home Follow Up Recommendations: Outpatient OT Equipment Recommended: To be determined   OT Evaluation Precautions/Restrictions  Precautions Precautions: Fall Precaution/Restrictions Comments: R-hemiplegia; Dysarthric Restrictions Weight Bearing Restrictions Per Provider Order: No General Chart Reviewed: Yes Family/Caregiver Present: No Pain Pain Assessment Pain Scale: 0-10 Pain Score: 0-No pain Home Living/Prior Functioning Home Living Family/patient expects to be  discharged to:: Private residence Living Arrangements: Spouse/significant other, Other relatives Available Help at Discharge: Family, Available 24 hours/day Type of Home: House Home Access: Stairs to enter [employer] of Steps: 2 Entrance Stairs-Rails: None Home Layout: Two level, Able to live on main level with bedroom/bathroom, Full bath on main level Bathroom Shower/Tub: Tub/shower unit, Curtain (No DME) Bathroom Toilet: Standard Bathroom Accessibility: Yes  Lives With: Spouse, Family IADL History Current License: Yes Education: GED Occupation: Full time employment Prior Function Level of Independence: Independent with transfers, Independent with gait, Independent with homemaking with ambulation  Able to Take Stairs?: Yes Driving: Yes Vocation: Full time employment Vision Baseline Vision/History: 1 Wears glasses (Wears contacts) Ability to See in Adequate Light: 0 Adequate Patient Visual Report: No change from baseline Perception  Perception: Within Functional Limits Praxis Praxis: WFL Cognition Cognition Overall Cognitive Status: Impaired/Different from baseline Arousal/Alertness: Awake/alert Memory: Appears intact Attention: Focused;Sustained Focused Attention: Appears intact Sustained Attention: Appears intact Awareness: Impaired Awareness Impairment: Intellectual impairment Problem Solving: Impaired Problem Solving Impairment: Verbal basic;Functional basic Executive Function: Decision Making Decision Making: Impaired Decision Making Impairment: Verbal complex;Functional complex Behaviors: Impulsive Safety/Judgment: Impaired Brief Interview for Mental Status (BIMS) Repetition of Three Words (First Attempt): 3 Temporal Orientation: Year: Correct Temporal Orientation: Month: Accurate within 5 days Temporal Orientation: Day: Correct Recall: Sock: Yes, no cue required Recall: Blue: Yes, no cue required Recall: Bed: Yes, no cue required BIMS  Summary Score: 15 Sensation Sensation Light Touch: Appears Intact Hot/Cold: Appears Intact Coordination Gross Motor Movements are Fluid and Coordinated: No Fine Motor Movements are Fluid and Coordinated: No Coordination and Movement Description: Deficits due to R-sided hemiplegia. Motor  Motor Motor: Hemiplegia Motor - Skilled Clinical Observations: R-sided hemiplegia.  Trunk/Postural Assessment  Cervical Assessment Cervical Assessment: Within Functional Limits Thoracic Assessment Thoracic Assessment: Within Functional Limits Lumbar Assessment Lumbar Assessment: Within Functional Limits Postural Control Postural Control: Deficits on evaluation (Delayed)  Balance Balance Balance Assessed: Yes Static Sitting Balance Static Sitting - Balance Support: Feet supported Static Sitting - Level of Assistance: 5: Stand by assistance Dynamic Sitting Balance Dynamic Sitting - Balance Support: During functional activity Dynamic Sitting - Level of Assistance: 5: Stand by assistance (SUP-CGA) Static Standing Balance Static Standing - Balance Support: During functional activity;Left upper extremity supported Static Standing - Level of Assistance: 4: Min assist Dynamic Standing Balance Dynamic Standing - Balance  Support: During functional activity;No upper extremity supported Dynamic Standing - Level of Assistance: 3: Mod assist Extremity/Trunk Assessment RUE Assessment RUE Assessment: Exceptions to [facility] Active Range of Motion (AROM) Comments: Flaccid UE General Strength Comments: 0/5 RUE Body System: Neuro Brunstrum levels for arm and hand: Arm;Hand Brunstrum level for arm: Stage I Presynergy Brunstrum level for hand: Stage I Flaccidity LUE Assessment LUE Assessment: Within Functional Limits  Care Tool Care Tool Self Care Eating   Eating Assist Level: Set up assist    Oral Care    Oral Care Assist Level: Set up assist    Bathing   Body parts bathed by patient: Chest;Right  arm;Abdomen;Front perineal area;Right upper leg;Left upper leg;Right lower leg;Left lower leg;Face Body parts bathed by helper: Left arm;Buttocks   Assist Level: Minimal Assistance - Patient > 75%    Upper Body Dressing(including orthotics)   What is the patient wearing?: Pull over shirt   Assist Level: Minimal Assistance - Patient > 75%    Lower Body Dressing (excluding footwear)   What is the patient wearing?: Pants Assist for lower body dressing: Maximal Assistance - Patient 25 - 49%    Putting on/Taking off footwear   What is the patient wearing?: Non-skid slipper socks Assist for footwear: Total Assistance - Patient < 25%       Care Tool Toileting Toileting activity   Assist for toileting: Moderate Assistance - Patient 50 - 74%     Care Tool Bed Mobility Roll left and right activity        Sit to lying activity        Lying to sitting on side of bed activity         Care Tool Transfers Sit to stand transfer        Chair/bed transfer         Toilet transfer   Assist Level: Moderate Assistance - Patient 50 - 74%     Care Tool Cognition  Expression of Ideas and Wants Expression of Ideas and Wants: 2. Frequent difficulty - frequently exhibits difficulty with expressing needs and ideas  Understanding Verbal and Non-Verbal Content Understanding Verbal and Non-Verbal Content: 4. Understands (complex and basic) - clear comprehension without cues or repetitions   Memory/Recall Ability Memory/Recall Ability : Current season;That he or she is in a hospital/hospital unit   Refer to Care Plan for Long Term Goals  SHORT TERM GOAL WEEK 1 OT Short Term Goal 1 (Week 1): Pt will complete LB dressing with Min A. OT Short Term Goal 2 (Week 1): Pt will perform toilet transfer with consistent Min A + LRAD. OT Short Term Goal 3 (Week 1): Pt will standing balance during ADLs with Min A + LRAD.  Recommendations for other services: Neuropsych   Skilled Therapeutic  Intervention  Session began with introduction to OT role, OT POC, and general orientation to rehab unit/schedule. Pt completes full-body sponge-bathing with levels of assistance noted below. Education provided on hemi-dressing techniques. Stand-pivot transfers with Mod A progressing to Min A moving left. Pt transported from room<>main therapy gym. With use of arm-skate patient completes 1x5 reps of shoulder flexion/extension and shoulder abduction/adduction, OT provides up to Mod-Max A to reach end-ranges but minimal activation is noted with shoulder adduction. Saebo one Estim donned to wrist/digital extensors with great results, activation of digits 2-4 achieved. PA messaged for resting hand splint. Pt remained resting in bed with all immediate needs met.   ADL ADL Eating: Set up Where Assessed-Eating: Bed level Grooming:  Setup Where Assessed-Grooming: Edge of bed Upper Body Bathing: Minimal assistance Where Assessed-Upper Body Bathing: Edge of bed Lower Body Bathing: Minimal assistance Where Assessed-Lower Body Bathing: Edge of bed Upper Body Dressing: Setup;Minimal cueing Where Assessed-Upper Body Dressing: Edge of bed Lower Body Dressing: Maximal assistance Where Assessed-Lower Body Dressing: Edge of bed Toileting: Moderate assistance;Maximal assistance Where Assessed-Toileting: Toilet;Bedside Commode Toilet Transfer: Minimal assistance;Moderate assistance Toilet Transfer Method: Stand pivot Acupuncturist: Gaffer: Not assessed Film/video Editor: Not assessed Mobility  Transfers Sit to Stand: Moderate Assistance - Patient 50-74% Stand to Sit: Moderate Assistance - Patient 50-74%   Discharge Criteria: Patient will be discharged from OT if patient refuses treatment 3 consecutive times without medical reason, if treatment goals not met, if there is a change in medical status, if patient makes no progress towards goals or if patient is  discharged from hospital.  The above assessment, treatment plan, treatment alternatives and goals were discussed and mutually agreed upon: by patient  Nereida Habermann, OTR/L, MSOT  03/29/2024, 3:48 PM

## 2024-03-29 NOTE — Plan of Care (Signed)
" °  Problem: RH Expression Communication Goal: LTG Patient will increase speech intelligibility (SLP) Description: LTG: Patient will increase speech intelligibility at word/phrase/conversation level with cues, % of the time (SLP) Flowsheets (Taken 03/29/2024 1236) LTG: Patient will increase speech intelligibility (SLP): Minimal Assistance - Patient > 75% Level: (sentence) Other (Comment) Percent of time patient will use intelligible speech: 80   Problem: RH Problem Solving Goal: LTG Patient will demonstrate problem solving for (SLP) Description: LTG:  Patient will demonstrate problem solving for basic/complex daily situations with cues  (SLP) Flowsheets (Taken 03/29/2024 1236) LTG: Patient will demonstrate problem solving for (SLP): (moderate complexity) Other (comment) LTG Patient will demonstrate problem solving for: Modified Independent   "

## 2024-03-29 NOTE — Progress Notes (Signed)
 "                                                        PROGRESS NOTE   Subjective/Complaints:  No events overnight.  Complaining of 6 out of 10 right hip pain through the nigh--states is baseline, no spasms or radiation.  Blood pressure elevated 150s to 180s overnight systolic, asymptomatic, other vitals are stable.    03/28/2024    8:17 PM 03/28/2024    4:18 PM 03/28/2024    3:19 PM  Vitals with BMI  Height  5' 8   Weight  120 lbs 2 oz   BMI  18.27   Systolic 156 181   181 156  Diastolic 85 95   95 92  Pulse 68 86   86 82    Admission BMP stable, CBC significant for hemoglobin decreased 12.1-10.7.  Thrombocytosis 420.  P.o. intakes fair, 25% dinner last night. Continent of bladder   Last BM 12/23, small, incontinent  ROS: Denies fevers, chills, N/V, abdominal pain, constipation, diarrhea, SOB, cough, chest pain, new weakness or paraesthesias.   + Bowel incontinence + Right hip pain  Objective:   No results found. Recent Labs    03/29/24 0431  WBC 10.8*  HGB 10.7*  HCT 31.1*  PLT 420*   Recent Labs    03/29/24 0431  NA 139  K 4.1  CL 104  CO2 25  GLUCOSE 102*  BUN 13  CREATININE 0.72  CALCIUM  9.1    Intake/Output Summary (Last 24 hours) at 03/29/2024 0840 Last data filed at 03/29/2024 0800 Gross per 24 hour  Intake 150 ml  Output 500 ml  Net -350 ml        Physical Exam: Vital Signs Blood pressure (!) 156/85, pulse 68, temperature 98.6 F (37 C), resp. rate 18, height 5' 8 (1.727 m), weight 54.5 kg, SpO2 99%.  General: No acute distress.  Sitting in wheelchair after therapies. Mood and affect are appropriate Heart: Regular rate and rhythm no rubs murmurs or extra sounds Lungs: Clear to auscultation, breathing unlabored, no rales or wheezes Abdomen: Positive bowel sounds, soft nontender to palpation, nondistended Extremities: No clubbing, cyanosis, or edema Skin: No evidence of breakdown, no evidence of rash Neurologic: Cranial nerves  II through XII intact, motor strength is 5/5 in left and 0/5 right deltoid, bicep, tricep, grip; 1/5 hip flexor, knee extensors; 0/5 ankle dorsiflexor and 1/5 plantar flexor  Sensory exam normal sensation to light touch  in bilateral upper and lower extremities Cerebellar exam normal finger to nose to finger as well as heel to shin in left upper and lower extremities, unable to perform on right side due to weakness Musculoskeletal: Full range of motion in all 4 extremities. No joint swelling  Unchanged 12/22  Assessment/Plan: 1. Functional deficits which require 3+ hours per day of interdisciplinary therapy in a comprehensive inpatient rehab setting. Physiatrist is providing close team supervision and 24 hour management of active medical problems listed below. Physiatrist and rehab team continue to assess barriers to discharge/monitor patient progress toward functional and medical goals  Care Tool:  Bathing              Bathing assist       Upper Body Dressing/Undressing Upper body dressing        Upper body  assist      Lower Body Dressing/Undressing Lower body dressing            Lower body assist       Toileting Toileting    Toileting assist       Transfers Chair/bed transfer  Transfers assist           Locomotion Ambulation   Ambulation assist              Walk 10 feet activity   Assist           Walk 50 feet activity   Assist           Walk 150 feet activity   Assist           Walk 10 feet on uneven surface  activity   Assist           Wheelchair     Assist               Wheelchair 50 feet with 2 turns activity    Assist            Wheelchair 150 feet activity     Assist          Blood pressure (!) 156/85, pulse 68, temperature 98.6 F (37 C), resp. rate 18, height 5' 8 (1.727 m), weight 54.5 kg, SpO2 99%.  Medical Problem List and Plan: 1. Functional deficits secondary  to left radiata and basal ganglia infarct with right hemiplegia, dysarthria and aphasia             -patient may  shower             -ELOS/Goals: 10-14d supervision goals - 04/12/24 DC  - Stable to continue inpatient rehab   - 12/23: OT eval pending. A little impulsive/quick to move, but overall did well with PT.   2.  Antithrombotics: -DVT/anticoagulation:  Mechanical: Antiembolism stockings, thigh (TED hose) Bilateral lower extremities             -antiplatelet therapy: Aspirin  81 mg daily and Brilinta  90 mg twice daily x 4 weeks then aspirin  alone 3. Pain Management: Oxycodone  5 mg every 6 hours as needed 4. Mood/Behavior/Sleep: Provide emotional support             -antipsychotic agents: N/A 5. Neuropsych/cognition: This patient is capable of making decisions on his own behalf. 6. Skin/Wound Care: Routine skin checks 7. Fluids/Electrolytes/Nutrition: Routine in and outs with follow-up chemistries 8.  Hyperlipidemia.  Crestor  9.  History of alcohol as well as tobacco use.  Continue Nicorette  gum.  Latest alcohol level 155 03/17/2024.  Provide counseling  10.  Hypertension.  Home medications amlodipine  5 mg daily, lisinopril  20 mg daily.  Goal BP less than 220/110.  Per discharge, gradually resume antihypertensive over next 2 weeks based on BP.  12/23: Monitor with therapies today, will resume lisinopril  at low-dose 5 mg in a.m.     03/28/2024    8:17 PM 03/28/2024    4:18 PM 03/28/2024    3:19 PM  Vitals with BMI  Height  5' 8   Weight  120 lbs 2 oz   BMI  18.27   Systolic 156 181   181 156  Diastolic 85 95   95 92  Pulse 68 86   86 82    11.  Anemia.  Hemoglobin 12.1-10.7 on admission.  FOBT, H&H in AM.    - Continue aspirin  and Brilinta  for now  12.  Thrombocytosis.  Likely reactive, trend.  13. Bowel incontinence. Timed toiletting.   LOS: 1 days A FACE TO FACE EVALUATION WAS PERFORMED  Isaac Meza 03/29/2024, 8:40 AM     "

## 2024-03-29 NOTE — Plan of Care (Signed)
" °  Problem: RH Balance Goal: LTG Patient will maintain dynamic standing with ADLs (OT) Description: LTG:  Patient will maintain dynamic standing balance with assist during activities of daily living (OT)  Flowsheets (Taken 03/29/2024 1555) LTG: Pt will maintain dynamic standing balance during ADLs with: Supervision/Verbal cueing   Problem: Sit to Stand Goal: LTG:  Patient will perform sit to stand in prep for activites of daily living with assistance level (OT) Description: LTG:  Patient will perform sit to stand in prep for activites of daily living with assistance level (OT) Flowsheets (Taken 03/29/2024 1555) LTG: PT will perform sit to stand in prep for activites of daily living with assistance level: Supervision/Verbal cueing   Problem: RH Bathing Goal: LTG Patient will bathe all body parts with assist levels (OT) Description: LTG: Patient will bathe all body parts with assist levels (OT) Flowsheets (Taken 03/29/2024 1555) LTG: Pt will perform bathing with assistance level/cueing: Supervision/Verbal cueing   Problem: RH Dressing Goal: LTG Patient will perform upper body dressing (OT) Description: LTG Patient will perform upper body dressing with assist, with/without cues (OT). Flowsheets (Taken 03/29/2024 1555) LTG: Pt will perform upper body dressing with assistance level of: Supervision/Verbal cueing Goal: LTG Patient will perform lower body dressing w/assist (OT) Description: LTG: Patient will perform lower body dressing with assist, with/without cues in positioning using equipment (OT) Flowsheets (Taken 03/29/2024 1555) LTG: Pt will perform lower body dressing with assistance level of: Supervision/Verbal cueing   Problem: RH Toileting Goal: LTG Patient will perform toileting task (3/3 steps) with assistance level (OT) Description: LTG: Patient will perform toileting task (3/3 steps) with assistance level (OT)  Flowsheets (Taken 03/29/2024 1555) LTG: Pt will perform toileting  task (3/3 steps) with assistance level: Supervision/Verbal cueing   Problem: RH Functional Use of Upper Extremity Goal: LTG Patient will use RT/LT upper extremity as a (OT) Description: LTG: Patient will use right/left upper extremity as a stabilizer/gross assist/diminished/nondominant/dominant level with assist, with/without cues during functional activity (OT) Flowsheets (Taken 03/29/2024 1555) LTG: Use of upper extremity in functional activities: RUE as a stabilizer LTG: Pt will use upper extremity in functional activity with assistance level of: Supervision/Verbal cueing   Problem: RH Toilet Transfers Goal: LTG Patient will perform toilet transfers w/assist (OT) Description: LTG: Patient will perform toilet transfers with assist, with/without cues using equipment (OT) Flowsheets (Taken 03/29/2024 1555) LTG: Pt will perform toilet transfers with assistance level of: Supervision/Verbal cueing   Problem: RH Tub/Shower Transfers Goal: LTG Patient will perform tub/shower transfers w/assist (OT) Description: LTG: Patient will perform tub/shower transfers with assist, with/without cues using equipment (OT) Flowsheets (Taken 03/29/2024 1555) LTG: Pt will perform tub/shower stall transfers with assistance level of: Supervision/Verbal cueing   "

## 2024-03-30 LAB — HEMOGLOBIN AND HEMATOCRIT, BLOOD
HCT: 30 % — ABNORMAL LOW (ref 39.0–52.0)
Hemoglobin: 10.5 g/dL — ABNORMAL LOW (ref 13.0–17.0)

## 2024-03-30 LAB — OCCULT BLOOD X 1 CARD TO LAB, STOOL: Fecal Occult Bld: NEGATIVE

## 2024-03-30 MED ORDER — LIDOCAINE 5 % EX PTCH
1.0000 | MEDICATED_PATCH | CUTANEOUS | Status: DC
  Administered 2024-03-30 – 2024-04-03 (×5): 1 via TRANSDERMAL
  Filled 2024-03-30 (×5): qty 1

## 2024-03-30 MED ORDER — ACETAMINOPHEN 325 MG PO TABS: 650.0000 mg | ORAL_TABLET | Freq: Four times a day (QID) | ORAL | Status: DC | PRN

## 2024-03-30 MED ORDER — BACLOFEN 5 MG HALF TABLET
5.0000 mg | ORAL_TABLET | Freq: Three times a day (TID) | ORAL | Status: DC | PRN
  Administered 2024-04-04: 5 mg via ORAL

## 2024-03-30 MED ORDER — OXYCODONE HCL 5 MG PO TABS
5.0000 mg | ORAL_TABLET | Freq: Four times a day (QID) | ORAL | Status: DC | PRN
  Administered 2024-04-04 – 2024-04-11 (×11): 5 mg via ORAL
  Filled 2024-03-30 (×2): qty 1

## 2024-03-30 NOTE — Progress Notes (Signed)
 Physical Therapy Session Note  Patient Details  Name: Isaac Meza MRN: [id number] Date of Birth: Aug 13, 1970  Today's Date: 03/30/2024 PT Individual Time: 1420-1530 PT Individual Time Calculation (min): 70 min   Short Term Goals: Week 1:  PT Short Term Goal 1 (Week 1): Pt will complete sit to stand transfer with CGA consistently. PT Short Term Goal 2 (Week 1): Pt will complete bed to chair transfer with minA consistently. PT Short Term Goal 3 (Week 1): Pt will ambulate x50' with minA and LRAD. PT Short Term Goal 4 (Week 1): Pt will complete x2 steps with LHR and minA.  Skilled Therapeutic Interventions/Progress Updates:      Pt supine in bed upon arrival. Pt agreeable to therapy. Pt denies any pain.   Bed mobility: supine to sit, sit to supine with use of bed features and supervision, pt heavily dependent on L UE/LE for management of R UE/R LE.   Stand pivot transfer bed to WC, WC to bed with no AD and min A, guarding for R LE buckling.   Pt ambulated 1x30 feet with L UE support on rail and R LE DF assist ace wrap and min A with +2 for WC follow. Verbal cues provided for increased weight shift to R LE in stance phase, increased step length L LE, and increased foot clearance R LE.   Pt ambulated 1x20 feet with no AD and B HHA and min -mod A (with fatigue) for management of R LE buckling. Pt able to advance R LE, however requires assist for correction of R LE hip external rotation.   Education provided to pt and pt wife regarding R LE AFO. Recommending pt wife bring in tennis shoes to trial R LE AFO. Pt wife plans to bring next session.   Pt performed sit to stand x5 with PT providing approximation to R UE on R knee and pt utilzing L UE on L knee, and min A, verbal cues provided for anterior weight shift.   Pt performed standing marching x 10 B with PT providing active assisted hip flexion R LE, and stabilizing R LE for L LE advancement.   Pt performed TKE x10  against red TB with  PT providing verbal and tactile cues and assist for completion within available range.   Pt supine in bed with all needs within reach and bed alarm on.     Therapy Documentation Precautions:  Precautions Precautions: Fall Precaution/Restrictions Comments: R-hemiplegia; Dysarthric Restrictions Weight Bearing Restrictions Per Provider Order: No  Therapy/Group: Individual Therapy  [facility] Doreene Orris, [place], DPT  03/30/2024, 3:19 PM

## 2024-03-30 NOTE — Progress Notes (Signed)
 "                                                        PROGRESS NOTE   Subjective/Complaints:  No events overnight.   BP down/normotensive this AM. Ongoing hip pain overnight--states has been going on since this fall, directly over his right hip, no-isms overnight.  ROS: Denies fevers, chills, N/V, abdominal pain, constipation, diarrhea, SOB, cough, chest pain, new weakness or paraesthesias.   + Bowel incontinence--has not recurred + Right hip pain  Objective:   No results found. Recent Labs    03/29/24 0431 03/30/24 0433  WBC 10.8*  --   HGB 10.7* 10.5*  HCT 31.1* 30.0*  PLT 420*  --    Recent Labs    03/29/24 0431  NA 139  K 4.1  CL 104  CO2 25  GLUCOSE 102*  BUN 13  CREATININE 0.72  CALCIUM  9.1    Intake/Output Summary (Last 24 hours) at 03/30/2024 0843 Last data filed at 03/30/2024 0700 Gross per 24 hour  Intake 592 ml  Output 1200 ml  Net -608 ml        Physical Exam: Vital Signs Blood pressure 132/84, pulse 80, temperature 98.4 F (36.9 C), resp. rate 18, height 5' 8 (1.727 m), weight 54.5 kg, SpO2 100%.  General: No acute distress.  Sitting up at bedside. Mood and affect are appropriate Heart: Regular rate and rhythm no rubs murmurs or extra sounds Lungs: Clear to auscultation, breathing unlabored, no rales or wheezes Abdomen: Positive bowel sounds, soft nontender to palpation, nondistended Extremities: No clubbing, cyanosis, or edema Skin: No evidence of breakdown, no evidence of rash + Bruising over right hip, flank  Neurologic: Cranial nerves II through XII intact, motor strength is 5/5 in left and 0/5 right deltoid, bicep, tricep, grip; 1/5 hip flexor, knee extensors; 0/5 ankle dorsiflexor and 1/5 plantar flexor--unchanged  Sensory exam normal sensation to light touch  in bilateral upper and lower extremities No ataxia. Tone: MAS 1+ right knee flexor only  Musculoskeletal: Full range of motion in all 4 extremities. No joint swelling  +  TTP over right GTB, nonradiating,  Assessment/Plan: 1. Functional deficits which require 3+ hours per day of interdisciplinary therapy in a comprehensive inpatient rehab setting. Physiatrist is providing close team supervision and 24 hour management of active medical problems listed below. Physiatrist and rehab team continue to assess barriers to discharge/monitor patient progress toward functional and medical goals  Care Tool:  Bathing    Body parts bathed by patient: Chest, Right arm, Abdomen, Front perineal area, Right upper leg, Left upper leg, Right lower leg, Left lower leg, Face   Body parts bathed by helper: Left arm, Buttocks     Bathing assist Assist Level: Minimal Assistance - Patient > 75%     Upper Body Dressing/Undressing Upper body dressing   What is the patient wearing?: Pull over shirt    Upper body assist Assist Level: Minimal Assistance - Patient > 75%    Lower Body Dressing/Undressing Lower body dressing      What is the patient wearing?: Pants     Lower body assist Assist for lower body dressing: Maximal Assistance - Patient 25 - 49%     Toileting Toileting    Toileting assist Assist for toileting: Moderate Assistance - Patient 50 -  74%     Transfers Chair/bed transfer  Transfers assist     Chair/bed transfer assist level: Moderate Assistance - Patient 50 - 74%     Locomotion Ambulation   Ambulation assist      Assist level: 2 helpers Assistive device:  (Lt handrail) Max distance: 30'   Walk 10 feet activity   Assist     Assist level: 2 helpers Assistive device:  (Left handrail)   Walk 50 feet activity   Assist Walk 50 feet with 2 turns activity did not occur: Safety/medical concerns         Walk 150 feet activity   Assist Walk 150 feet activity did not occur: Safety/medical concerns         Walk 10 feet on uneven surface  activity   Assist Walk 10 feet on uneven surfaces activity did not occur:  Safety/medical concerns         Wheelchair     Assist Is the patient using a wheelchair?: Yes Type of Wheelchair: Manual    Wheelchair assist level: Dependent - Patient 0% Max wheelchair distance: 150'    Wheelchair 50 feet with 2 turns activity    Assist        Assist Level: Dependent - Patient 0%   Wheelchair 150 feet activity     Assist      Assist Level: Dependent - Patient 0%   Blood pressure 132/84, pulse 80, temperature 98.4 F (36.9 C), resp. rate 18, height 5' 8 (1.727 m), weight 54.5 kg, SpO2 100%.  Medical Problem List and Plan: 1. 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 A little impulsive/quick to move, but overall did well with PT.   2.  Antithrombotics: -DVT/anticoagulation:  Mechanical: Antiembolism stockings, thigh (TED hose) Bilateral lower extremities             -antiplatelet therapy: Aspirin  81 mg daily and Brilinta  90 mg twice daily x 4 weeks then aspirin  alone 3. Pain Management: Oxycodone  5 mg every 6 hours as needed  12/24: Add Tylenol  650 mg Q6H PRN for mild to moderate pain, baclofen  5 mg TID PRN for spasms.  Lidoderm  patch over right hip for GTB pain.  4. Mood/Behavior/Sleep: Provide emotional support             -antipsychotic agents: N/A 5. Neuropsych/cognition: This patient is capable of making decisions on his own behalf. 6. Skin/Wound Care: Routine skin checks  - Monitor diffuse bruising on right flank, stable 7. Fluids/Electrolytes/Nutrition: Routine in and outs with follow-up chemistries 8.  Hyperlipidemia.  Crestor  9.  History of alcohol as well as tobacco use.  Continue Nicorette  gum.  Latest alcohol level 155 03/17/2024.  Provide counseling  10.  Hypertension.  Home medications  amlodipine  5 mg daily, lisinopril  20 mg daily.  Goal BP less than 220/110.  Per discharge, gradually resume antihypertensive over next 2 weeks based on BP.  12/23: Monitor with therapies today, will resume lisinopril  at low-dose 5 mg in a.m.  12/24: normotensive - monitor     03/30/2024    6:31 AM 03/29/2024    8:16 PM 03/29/2024   12:56 PM  Vitals with BMI  Systolic 132 138 878  Diastolic 84 88 86  Pulse 80 72 72    11.  Anemia.  Hemoglobin 12.1->10.7 on admission.  FOBT, H&H in AM.    - Continue aspirin  and Brilinta  for now 12/24: h/h stable 10.7; FOBT pending  12.  Thrombocytosis.  Likely reactive, trend.  13. Bowel incontinence. Timed toiletting.   LOS: 2 days A FACE TO FACE EVALUATION WAS PERFORMED  Isaac Meza 03/30/2024, 8:43 AM     "

## 2024-03-30 NOTE — Progress Notes (Signed)
 Occupational Therapy Session Note  Patient Details  Name: Isaac Meza MRN: [id number] Date of Birth: 16-Dec-1970  Today's Date: 03/30/2024 OT Individual Time: 0848-1000 OT Individual Time Calculation (min): 72 min    Short Term Goals: Week 1:  OT Short Term Goal 1 (Week 1): Pt will complete LB dressing with Min A. OT Short Term Goal 2 (Week 1): Pt will perform toilet transfer with consistent Min A + LRAD. OT Short Term Goal 3 (Week 1): Pt will standing balance during ADLs with Min A + LRAD.  Skilled Therapeutic Interventions/Progress Updates:    Pt received supine with no c/o pain, agreeable to OT session, excited about taking a shower. He came to EOB with heavy use of multiple bed rails but (S). He completed a squat pivot to the w/c toward the L side with min A. Another squat pivot with min A to the TTB in the walk in shower. Mod A to remove LB clothing sit > stand. Education/demonstration provided for multiple hemi bathing techniques. He was able to wash UB seated with CGA, LB with min A sit > stand. He transferred back to the w/c following with min A. He completed oral care at the sink with set up assist. He donned a shirt with cueing for hemi technique and then min A to don. He has excellent adherence to cueing and motivation. He donned pants with cueing for figure 4 technique, min A to thread and stand. Pt was taken via w/c to the therapy gym for time management. He worked on [place] for the remainder of the session, using the Saebo e-stim in conjunction with the Saebo mobile arm support. Without e-stim initially he was able to demonstrate about 5-10 degrees of elbow flex and ext. He completed 10 min of elbow flex isolation with e -stim and then elbow extension as well, with excellent return and visual attention to limb. Provided NMR education throughout session. Lastly saebo was placed on his anterior deltoid to promote shoulder flexion and with mod facilitation in the MAS he lifted arm to  100 degrees. He returned to his room following. Stand pivot back to bed with min A using bed rail. Pt was left supine with all needs met, bed alarm set and call bell within reach.   Saebo Stim One was placed on his R dorsal wrist to promote increased volitional wrist and finger ext for NMR. No c/o pain or adverse skin events. Pt tolerated 60 min unattended.  330 pulse width 35 Hz pulse rate On 8 sec/ off 8 sec Ramp up/ down 2 sec Symmetrical Biphasic wave form  Max intensity at 500 Ohm load   Therapy Documentation Precautions:  Precautions Precautions: Fall Precaution/Restrictions Comments: R-hemiplegia; Dysarthric Restrictions Weight Bearing Restrictions Per Provider Order: No Therapy/Group: Individual Therapy  [name] 03/30/2024, 9:13 AM

## 2024-03-30 NOTE — Progress Notes (Signed)
 Speech Language Pathology Daily Session Note  Patient Details  Name: Isaac Meza MRN: [id number] Date of Birth: 07-10-1970  Today's Date: 03/30/2024 SLP Individual Time: 9198-9153 SLP Individual Time Calculation (min): 45 min  Short Term Goals: Week 1: SLP Short Term Goal 1 (Week 1): Patient will demonstrate moderate complexity problem solving given sup multimodal A SLP Short Term Goal 2 (Week 1): Patient will increase speech intelligibility to 60% at the phrase level given min multimodal A SLP Short Term Goal 3 (Week 1): Patient will recall SLOP speech intelligibility strategies with sup A  Skilled Therapeutic Interventions:  Patient was seen in am to address speech intelligibility. Pt was alert and seated upright in bed upon SLP arrival. He was agreeable for session. SLP introduced SLOP speech intelligibility strategies and guided pt in verbalization of biographical information. Pt ~50% intelligible at phrase level with mod cues. EMST introduced and pt provided instruction and education on achieving successful reps. Pt's maximum expiratory pressure was 54 cm H2O which is below norm for pt's age and gender. EMST initiated at 30cm H2O. He completed 5 sets of 5 with perceived difficulty level of 4 out of 10. At conclusion of session, pt was left in bed with call button within reach and bed alarm active. SLP to continue POC.   Pain Pain Assessment Pain Scale: 0-10 Pain Score: 7  Pain Type: Acute pain Pain Location: Hip Pain Orientation: Right Pain Descriptors / Indicators: Aching Pain Intervention(s): Medication (See eMAR)  Therapy/Group: Individual Therapy  Joane GORMAN Fuss 03/30/2024, 8:39 AM

## 2024-03-31 MED ORDER — DOCUSATE SODIUM 100 MG PO CAPS
100.0000 mg | ORAL_CAPSULE | Freq: Two times a day (BID) | ORAL | Status: DC
  Administered 2024-03-31 – 2024-04-12 (×25): 100 mg via ORAL
  Filled 2024-03-31 (×11): qty 1

## 2024-03-31 NOTE — IPOC Note (Signed)
 Overall Plan of Care [facility]) Patient Details Name: Isaac Meza MRN: [id number] DOB: 06/06/1970  Admitting Diagnosis: Infarction of left basal ganglia [facility])  Hospital Problems: Principal Problem:   Infarction of left basal ganglia (HCC)     Functional Problem List: Nursing Bladder, Bowel, Edema, Endurance, Medication Management, Pain, Safety  PT Balance, Endurance, Motor, Safety  OT Balance, Endurance, Motor, Safety  SLP Cognition  TR         Basic ADLs: OT Eating, Grooming, Bathing, Dressing, Toileting     Advanced  ADLs: OT       Transfers: PT Bed Mobility, Bed to Chair, Car, Occupational Psychologist, Research Scientist (life Sciences): PT Ambulation, Psychologist, [employer], Stairs     Additional Impairments: OT Fuctional Use of Upper Extremity  SLP Communication, Social Cognition expression Problem Solving  TR      Anticipated Outcomes Item Anticipated Outcome  Self Feeding Mod I  Swallowing      Basic self-care  Marketing Executive Transfers Supervision  Bowel/Bladder  manage bowels with prnn medications  Transfers  Supervision  Locomotion  Supervision  Communication  Min A  Cognition  Sup A  Pain  <4 w/ prn  Safety/Judgment  manage safety with supervision assistance   Therapy Plan: PT Intensity: Minimum of 1-2 x/day ,45 to 90 minutes PT Frequency: 5 out of 7 days PT Duration Estimated Length of Stay: 14-17 Days OT Intensity: Minimum of 1-2 x/day, 45 to 90 minutes OT Frequency: 5 out of 7 days OT Duration/Estimated Length of Stay: ~2-3 weeks SLP Intensity: Minumum of 1-2 x/day, 30 to 90 minutes SLP Frequency: 3 to 5 out of 7 days SLP Duration/Estimated Length of Stay: 14 days   Team Interventions: Nursing Interventions Patient/Family Education, Bladder Management, Bowel Management, Disease Management/Prevention, Medication Management, Discharge Planning  PT interventions Ambulation/gait training, Community  reintegration, DME/adaptive equipment instruction, Neuromuscular re-education, Stair training, Psychosocial support, UE/LE Strength taining/ROM, Wheelchair propulsion/positioning, Warden/ranger, Functional electrical stimulation, Discharge planning, Pain management, Skin care/wound management, Therapeutic Activities, UE/LE Coordination activities, Cognitive remediation/compensation, Disease management/prevention, Functional mobility training, Patient/family education, Splinting/orthotics, Therapeutic Exercise, Visual/perceptual remediation/compensation  OT Interventions Warden/ranger, Community reintegration, Discharge planning, Disease mangement/prevention, DME/adaptive equipment instruction, Functional electrical stimulation, Functional mobility training, Neuromuscular re-education, Pain management, Patient/family education, Psychosocial support, Self Care/advanced ADL retraining, Skin care/wound managment, Splinting/orthotics, Therapeutic Activities, Therapeutic Exercise, UE/LE Strength taining/ROM, UE/LE Coordination activities, Wheelchair propulsion/positioning  SLP Interventions Cognitive remediation/compensation, Speech/Language facilitation, Internal/external aids, Patient/family education, Therapeutic Exercise, Therapeutic Activities, Environmental controls, Functional tasks  TR Interventions    SW/CM Interventions Discharge Planning, Psychosocial Support, Patient/Family Education   Barriers to Discharge MD  Medical stability, Home enviroment access/loayout, and Insurance for SNF coverage  Nursing Decreased caregiver support, Home environment access/layout Discharge: House  Discharge Home Layout: Two level, Able to live on main level with bedroom/bathroom  Alternate Level Stairs-Rails: None  Discharge Home Access: Stairs to enter  Entrance Stairs-Rails: None  Entrance Stairs-Number of Steps: 2  PT      OT Other (comments) Flaccid hemiplegia  SLP      SW Insurance  for SNF coverage, Medication compliance     Team Discharge Planning: Destination: PT-Home ,OT- Home , SLP-Home Projected Follow-up: PT-Outpatient PT, OT-  Outpatient OT, SLP-Outpatient SLP, Home Health SLP Projected Equipment Needs: PT-To be determined, OT- To be determined, SLP-None recommended by SLP Equipment Details: PT- , OT-  Patient/family involved in discharge planning: PT- Patient,  OT-Patient, SLP-Patient  MD ELOS: 10-14  days Medical Rehab Prognosis:  Excellent Assessment: The patient has been admitted for CIR therapies with the diagnosis of CVA. The team will be addressing functional mobility, strength, stamina, balance, safety, adaptive techniques and equipment, self-care, bowel and bladder mgt, patient and caregiver education,  . Goals have been set at Supervision. Anticipated discharge destination is home.       See Team Conference Notes for weekly updates to the plan of care

## 2024-03-31 NOTE — Progress Notes (Signed)
 "                                                        PROGRESS NOTE   Subjective/Complaints:  No events overnight.   Vital stable.  Continues with some right hip pain overnight, not using PRNs. Last bowel movement 12/23, small.  ROS: Denies fevers, chills, N/V, abdominal pain, constipation, diarrhea, SOB, cough, chest pain, new weakness or paraesthesias.   + Bowel incontinence--has not recurred + Right hip pain--improved  Objective:   No results found. Recent Labs    03/29/24 0431 03/30/24 0433  WBC 10.8*  --   HGB 10.7* 10.5*  HCT 31.1* 30.0*  PLT 420*  --    Recent Labs    03/29/24 0431  NA 139  K 4.1  CL 104  CO2 25  GLUCOSE 102*  BUN 13  CREATININE 0.72  CALCIUM  9.1    Intake/Output Summary (Last 24 hours) at 03/31/2024 9277 Last data filed at 03/31/2024 0220 Gross per 24 hour  Intake 236 ml  Output 475 ml  Net -239 ml        Physical Exam: Vital Signs Blood pressure 121/63, pulse 71, temperature 98.9 F (37.2 C), resp. rate 18, height 5' 8 (1.727 m), weight 54.5 kg, SpO2 100%.  General: No acute distress.  Laying in bed. Mood and affect are appropriate Heart: Regular rate and rhythm no rubs murmurs or extra sounds Lungs: Clear to auscultation, breathing unlabored, no rales or wheezes Abdomen: Positive bowel sounds, soft nontender to palpation, nondistended Extremities: No clubbing, cyanosis, or edema Skin: No evidence of breakdown, no evidence of rash + Bruising over right hip, flank  Neurologic: Cranial nerves II through XII intact, motor strength is 5/5 in left and 0/5 right deltoid, bicep, tricep, grip; 1/5 hip flexor, knee extensors; 0/5 ankle dorsiflexor and plantar flexor   Sensory exam normal sensation to light touch  in bilateral upper and lower extremities No ataxia. Tone: MAS 1+ right knee flexor only  Musculoskeletal: Full range of motion in all 4 extremities. No joint swelling  + TTP over right GTB, nonradiating  Physical exam  unchanged from the above on reexamination 03/31/2024    Assessment/Plan: 1. Functional deficits which require 3+ hours per day of interdisciplinary therapy in a comprehensive inpatient rehab setting. Physiatrist is providing close team supervision and 24 hour management of active medical problems listed below. Physiatrist and rehab team continue to assess barriers to discharge/monitor patient progress toward functional and medical goals  Care Tool:  Bathing    Body parts bathed by patient: Chest, Right arm, Abdomen, Front perineal area, Right upper leg, Left upper leg, Right lower leg, Left lower leg, Face   Body parts bathed by helper: Left arm, Buttocks     Bathing assist Assist Level: Minimal Assistance - Patient > 75%     Upper Body Dressing/Undressing Upper body dressing   What is the patient wearing?: Pull over shirt    Upper body assist Assist Level: Minimal Assistance - Patient > 75%    Lower Body Dressing/Undressing Lower body dressing      What is the patient wearing?: Pants     Lower body assist Assist for lower body dressing: Maximal Assistance - Patient 25 - 49%     Toileting Toileting    Toileting assist Assist  for toileting: Moderate Assistance - Patient 50 - 74%     Transfers Chair/bed transfer  Transfers assist     Chair/bed transfer assist level: Moderate Assistance - Patient 50 - 74%     Locomotion Ambulation   Ambulation assist      Assist level: 2 helpers Assistive device:  (Lt handrail) Max distance: 30'   Walk 10 feet activity   Assist     Assist level: 2 helpers Assistive device:  (Left handrail)   Walk 50 feet activity   Assist Walk 50 feet with 2 turns activity did not occur: Safety/medical concerns         Walk 150 feet activity   Assist Walk 150 feet activity did not occur: Safety/medical concerns         Walk 10 feet on uneven surface  activity   Assist Walk 10 feet on uneven surfaces activity  did not occur: Safety/medical concerns         Wheelchair     Assist Is the patient using a wheelchair?: Yes Type of Wheelchair: Manual    Wheelchair assist level: Dependent - Patient 0% Max wheelchair distance: 150'    Wheelchair 50 feet with 2 turns activity    Assist        Assist Level: Dependent - Patient 0%   Wheelchair 150 feet activity     Assist      Assist Level: Dependent - Patient 0%   Blood pressure 121/63, pulse 71, temperature 98.9 F (37.2 C), resp. rate 18, height 5' 8 (1.727 m), weight 54.5 kg, SpO2 100%.  Medical Problem List and Plan: 1. 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 A little impulsive/quick to move, but overall did well with PT.   2.  Antithrombotics: -DVT/anticoagulation:  Mechanical: Antiembolism stockings, thigh (TED hose) Bilateral lower extremities             -antiplatelet therapy: Aspirin  81 mg daily and Brilinta  90 mg twice daily x 4 weeks then aspirin  alone 3. Pain Management: Oxycodone  5 mg every 6 hours as needed  12/24: Add Tylenol  650 mg Q6H PRN for mild to moderate pain, baclofen  5 mg TID PRN for spasms.  Lidoderm  patch over right hip for GTB pain.  12/25: Pain continues, not using PRNs, continue current management; improved  4. Mood/Behavior/Sleep: Provide emotional support             -antipsychotic agents: N/A 5. Neuropsych/cognition: This patient is capable of making decisions on his own behalf. 6. Skin/Wound Care: Routine skin checks  - Monitor diffuse bruising on right flank, stable   - 12/25: DC IV  7. Fluids/Electrolytes/Nutrition: Routine in and outs with follow-up chemistries  - Admission labs stable; repeat 12/26 8.  Hyperlipidemia.  Crestor  9.  History of  alcohol as well as tobacco use.  Continue Nicorette  gum.  Latest alcohol level 155 03/17/2024.  Provide counseling  10.  Hypertension.  Home medications amlodipine  5 mg daily, lisinopril  20 mg daily.  Goal BP less than 220/110.  Per discharge, gradually resume antihypertensive over next 2 weeks based on BP.  12/23: Monitor with therapies today, will resume lisinopril  at low-dose 5  mg in a.m.  12/24-25: normotensive - monitor     03/31/2024    6:09 AM 03/30/2024    9:11 PM 03/30/2024    1:00 PM  Vitals with BMI  Systolic 121 136 889  Diastolic 63 80 70  Pulse 71 76 70    11.  Anemia.  Hemoglobin 12.1->10.7 on admission.  FOBT, H&H in AM.    - Continue aspirin  and Brilinta  for now 12/24: h/h stable 10.7; FOBT pending PT 12-26  12.  Thrombocytosis.  Likely reactive, trend.  13. Bowel incontinence/patient. Timed toiletting.   - Last bowel movement 12-23: Start Colace 100 mg twice daily  LOS: 3 days A FACE TO FACE EVALUATION WAS PERFORMED  Isaac Meza 03/31/2024, 7:22 AM     "

## 2024-03-31 NOTE — Progress Notes (Signed)
 Chronic pancreatitis likely is related to chronic alcohol use.

## 2024-04-01 ENCOUNTER — Inpatient Hospital Stay (HOSPITAL_COMMUNITY)

## 2024-04-01 DIAGNOSIS — R918 Other nonspecific abnormal finding of lung field: Secondary | ICD-10-CM | POA: Diagnosis not present

## 2024-04-01 DIAGNOSIS — M79604 Pain in right leg: Secondary | ICD-10-CM

## 2024-04-01 DIAGNOSIS — R058 Other specified cough: Secondary | ICD-10-CM | POA: Diagnosis not present

## 2024-04-01 LAB — BASIC METABOLIC PANEL WITH GFR
Anion gap: 9 (ref 5–15)
BUN: 8 mg/dL (ref 6–20)
CO2: 25 mmol/L (ref 22–32)
Calcium: 9.1 mg/dL (ref 8.9–10.3)
Chloride: 103 mmol/L (ref 98–111)
Creatinine, Ser: 0.67 mg/dL (ref 0.61–1.24)
GFR, Estimated: >60 mL/min
Glucose, Bld: 90 mg/dL (ref 70–99)
Potassium: 4.4 mmol/L (ref 3.5–5.1)
Sodium: 137 mmol/L (ref 135–145)

## 2024-04-01 LAB — CBC WITH DIFFERENTIAL/PLATELET
Abs Immature Granulocytes: 0.05 K/uL (ref 0.00–0.07)
Basophils Absolute: 0.1 K/uL (ref 0.0–0.1)
Basophils Relative: 1 %
Eosinophils Absolute: 0.3 K/uL (ref 0.0–0.5)
Eosinophils Relative: 3 %
HCT: 31.4 % — ABNORMAL LOW (ref 39.0–52.0)
Hemoglobin: 11 g/dL — ABNORMAL LOW (ref 13.0–17.0)
Immature Granulocytes: 0 %
Lymphocytes Relative: 19 %
Lymphs Abs: 2.3 K/uL (ref 0.7–4.0)
MCH: 32.7 pg (ref 26.0–34.0)
MCHC: 35 g/dL (ref 30.0–36.0)
MCV: 93.5 fL (ref 80.0–100.0)
Monocytes Absolute: 1.3 K/uL — ABNORMAL HIGH (ref 0.1–1.0)
Monocytes Relative: 11 %
Neutro Abs: 8.2 K/uL — ABNORMAL HIGH (ref 1.7–7.7)
Neutrophils Relative %: 66 %
Platelets: 467 K/uL — ABNORMAL HIGH (ref 150–400)
RBC: 3.36 MIL/uL — ABNORMAL LOW (ref 4.22–5.81)
RDW: 12.5 % (ref 11.5–15.5)
WBC: 12.2 K/uL — ABNORMAL HIGH (ref 4.0–10.5)
nRBC: 0 % (ref 0.0–0.2)

## 2024-04-01 MED ORDER — SENNA 8.6 MG PO TABS
1.0000 | ORAL_TABLET | Freq: Every day | ORAL | Status: DC
  Administered 2024-04-01 – 2024-04-11 (×11): 8.6 mg via ORAL
  Filled 2024-04-01 (×4): qty 1

## 2024-04-01 NOTE — Progress Notes (Signed)
 Speech Language Pathology Daily Session Note  Patient Details  Name: Isaac Meza MRN: [id number] Date of Birth: 12/22/1970  Today's Date: 04/01/2024 SLP Individual Time: 1330-1430 SLP Individual Time Calculation (min): 60 min  Short Term Goals: Week 1: SLP Short Term Goal 1 (Week 1): Patient will demonstrate moderate complexity problem solving given sup multimodal A SLP Short Term Goal 2 (Week 1): Patient will increase speech intelligibility to 60% at the phrase level given min multimodal A SLP Short Term Goal 3 (Week 1): Patient will recall SLOP speech intelligibility strategies with sup A  Skilled Therapeutic Interventions: Pt greeted at bedside for tx targeting cognition and communication. He was awake/alert in bed upon SLP arrival, eager for ST tx session. EMST completed via [id number]. He completed 15 cmH2O x 15, increased to 30 cmH2O for final 10 reps. EMST left @ 30 cmH2O and pt was encouraged to complete 5 sets of 5 one more time this date. SLP facilitated conversation re vocal quality. He reported a moderate change in vocal quality (hoarseness) as compared to baseline. Though, of note, he also reported change in vocal quality (introduction of hoarse vocal quality) ~7 years ago after injury to neck/upper back. PT was unable to provide additional details as to location and extent of surgery. Reported no intervention re voice. Pt required modA for problem solving, as he attempted to open a water bottle with his mouth and subsequently cut his lip and spilled a moderate amount of water. Suspect airway invasion of some of the spilled water, as immediate cough x2 was noted. However, during planned PO intake, no overt s/s of airway invasion were noted. He reports intermittent s/s of airway invasion in the morning only. SLP provided education re airway protection and voice. He voiced understanding, but will benefit from continued education in upcoming tx session d/t complexity of topic. He recalled  SLOP speech strategies w/ minA overall. During structured task at word level as well as functional sentence level production tasks, he required modA to maintain slow rate and pausing. SLP completed specific word finding task (category/letter) to dynamically assess complex naming given etiology of CVA. He did present w/ moderate specific word finding deficits and would benefit from continued complex naming tasks in upcoming tx sessions. At the end of tx tasks, he was left in bed w/ the call light within reach. Recommend cont ST per updated POC.    Pain Pain Assessment Pain Scale: 0-10 Pain Score: 0-No pain  Therapy/Group: Individual Therapy  [name] 04/01/2024, 1:46 PM

## 2024-04-01 NOTE — Progress Notes (Signed)
 "                                                        PROGRESS NOTE   Subjective/Complaints:  No events overnight.   Vital stable.   A.m. labs significant for mild increase in leukocytosis 12.2, thrombocytosis 467.  BMP stable.  Patient does have mild increase in cough today.  No sputum production, no fevers, no chills.  Notes more pain in the right leg as well  ROS: Denies fevers, chills, N/V, abdominal pain, constipation, diarrhea, SOB, cough, chest pain, new weakness or paraesthesias.   + Right hip pain--improved + Cough  Objective:   No results found. Recent Labs    03/30/24 0433 04/01/24 0605  WBC  --  12.2*  HGB 10.5* 11.0*  HCT 30.0* 31.4*  PLT  --  467*   Recent Labs    04/01/24 0605  NA 137  K 4.4  CL 103  CO2 25  GLUCOSE 90  BUN 8  CREATININE 0.67  CALCIUM  9.1    Intake/Output Summary (Last 24 hours) at 04/01/2024 0819 Last data filed at 04/01/2024 0801 Gross per 24 hour  Intake 506 ml  Output 1585 ml  Net -1079 ml        Physical Exam: Vital Signs Blood pressure 120/77, pulse 84, temperature 98.5 F (36.9 C), resp. rate 18, height 5' 8 (1.727 m), weight 54.5 kg, SpO2 100%.  General: No acute distress.  Laying in bed. Mood and affect are appropriate Heart: Regular rate and rhythm no rubs murmurs or extra sounds Lungs: Slight right basilar crackles, otherwise CTAB.  Wet sounding cough. Abdomen: Positive bowel sounds, soft nontender to palpation, nondistended Extremities: No clubbing, cyanosis, or edema Skin: No evidence of breakdown, no evidence of rash + Bruising over right hip, flank  Neurologic:  Awake, alert, and oriented x 3.  Cognition intact. Cranial nerves II through XII intact motor strength is 5/5 in left  1+/5 RUE, 1+/5 RLE proximal and 0/5 distal   Sensory exam normal sensation to light touch  in bilateral upper and lower extremities No ataxia. Tone: MAS 1+ right knee flexor only  Musculoskeletal:  + TTP over right GTB,  nonradiating   Assessment/Plan: 1. Functional deficits which require 3+ hours per day of interdisciplinary therapy in a comprehensive inpatient rehab setting. Physiatrist is providing close team supervision and 24 hour management of active medical problems listed below. Physiatrist and rehab team continue to assess barriers to discharge/monitor patient progress toward functional and medical goals  Care Tool:  Bathing    Body parts bathed by patient: Chest, Right arm, Abdomen, Front perineal area, Right upper leg, Left upper leg, Right lower leg, Left lower leg, Face   Body parts bathed by helper: Left arm, Buttocks     Bathing assist Assist Level: Minimal Assistance - Patient > 75%     Upper Body Dressing/Undressing Upper body dressing   What is the patient wearing?: Pull over shirt    Upper body assist Assist Level: Minimal Assistance - Patient > 75%    Lower Body Dressing/Undressing Lower body dressing      What is the patient wearing?: Pants     Lower body assist Assist for lower body dressing: Maximal Assistance - Patient 25 - 49%     Toileting Toileting  Toileting assist Assist for toileting: Moderate Assistance - Patient 50 - 74%     Transfers Chair/bed transfer  Transfers assist     Chair/bed transfer assist level: Moderate Assistance - Patient 50 - 74%     Locomotion Ambulation   Ambulation assist      Assist level: 2 helpers Assistive device:  (Lt handrail) Max distance: 30'   Walk 10 feet activity   Assist     Assist level: 2 helpers Assistive device:  (Left handrail)   Walk 50 feet activity   Assist Walk 50 feet with 2 turns activity did not occur: Safety/medical concerns         Walk 150 feet activity   Assist Walk 150 feet activity did not occur: Safety/medical concerns         Walk 10 feet on uneven surface  activity   Assist Walk 10 feet on uneven surfaces activity did not occur: Safety/medical  concerns         Wheelchair     Assist Is the patient using a wheelchair?: Yes Type of Wheelchair: Manual    Wheelchair assist level: Dependent - Patient 0% Max wheelchair distance: 150'    Wheelchair 50 feet with 2 turns activity    Assist        Assist Level: Dependent - Patient 0%   Wheelchair 150 feet activity     Assist      Assist Level: Dependent - Patient 0%   Blood pressure 120/77, pulse 84, temperature 98.5 F (36.9 C), resp. rate 18, height 5' 8 (1.727 m), weight 54.5 kg, SpO2 100%.  Medical Problem List and Plan: 1. 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 A little impulsive/quick to move, but overall did well with PT.   2.  Antithrombotics: -DVT/anticoagulation:  Mechanical: Antiembolism stockings, thigh (TED hose) Bilateral lower extremities             -antiplatelet therapy: Aspirin  81 mg daily and Brilinta  90 mg twice daily x 4 weeks then aspirin  alone  - 12/26: Right lower extremity duplex due to mild leukocytosis, pain and range of motion deficit.  3. Pain Management: Oxycodone  5 mg every 6 hours as needed  12/24: Add Tylenol  650 mg Q6H PRN for mild to moderate pain, baclofen  5 mg TID PRN for spasms.  Lidoderm  patch over right hip for GTB pain.  12/25: Pain continues, not using PRNs, continue current management; improved  4. Mood/Behavior/Sleep: Provide emotional support             -antipsychotic agents: N/A 5. Neuropsych/cognition: This patient is capable of making decisions on his own behalf. 6. Skin/Wound Care: Routine skin checks  - Monitor diffuse bruising on right flank, stable   - 12/25: DC IV  7. Fluids/Electrolytes/Nutrition: Routine in and outs with follow-up chemistries  - Admission  labs stable; repeat 12/26--stable  8.  Hyperlipidemia.  Crestor  9.  History of alcohol as well as tobacco use.  Continue Nicorette  gum.  Latest alcohol level 155 03/17/2024.  Provide counseling  10.  Hypertension.  Home medications amlodipine  5 mg daily, lisinopril  20 mg daily.  Goal BP less than 220/110.  Per discharge,  gradually resume antihypertensive over next 2 weeks based on BP.  12/23: Monitor with therapies today, will resume lisinopril  at low-dose 5 mg in a.m.  12/24-26: normotensive - monitor     04/01/2024    5:51 AM 03/31/2024    7:40 PM 03/31/2024   12:44 PM  Vitals with BMI  Systolic 120 111 888  Diastolic 77 66 77  Pulse 84 68 68    11.  Anemia.  Hemoglobin 12.1->10.7 on admission.  FOBT, H&H in AM.    - Continue aspirin  and Brilinta  for now 12/24: h/h stable 10.7; FOBT pending 12-26 hemoglobin stable, 11  12.  Thrombocytosis.  Likely reactive, trend.  - Stay in the 400s; monitor 13. Bowel incontinence/patient. Timed toiletting.   - Last bowel movement 12-23: Start Colace 100 mg twice daily   - 12/26: add sennakot nightly   14.  Leukocytosis.  12.3, no fevers or other signs of infection - will get bilateral lower extremity duplex  - repeat CXR  - repeat labs 12/27   LOS: 4 days A FACE TO FACE EVALUATION WAS PERFORMED  Isaac Meza 04/01/2024, 8:19 AM     "

## 2024-04-01 NOTE — Progress Notes (Signed)
 Occupational Therapy Session Note  Patient Details  Name: Isaac Meza MRN: [id number] Date of Birth: October 01, 1970  Today's Date: 04/01/2024 OT Individual Time: 9267-9149 OT Individual Time Calculation (min): 78 min    Short Term Goals: Week 1:  OT Short Term Goal 1 (Week 1): Pt will complete LB dressing with Min A. OT Short Term Goal 2 (Week 1): Pt will perform toilet transfer with consistent Min A + LRAD. OT Short Term Goal 3 (Week 1): Pt will standing balance during ADLs with Min A + LRAD.  Skilled Therapeutic Interventions/Progress Updates:  Pt greeted supine in bed, pt agreeable to OT intervention.      Transfers/bed mobility/functional mobility:  Pt completed supine>sit to R side of bed with MIN A to manage RLE and light MIN A needed to elevate trunk and scoot hips to EOB.   Pt completed squat pivot transfer to L side with CGA    ADLs:  Grooming: pt completed seated oral care with supervision, good carryover of hemi technique.   UB dressing:pt donned [place] shirt with MINA, pt able to recall hemi technique.   LB dressing: pt donned pants/underwear with MINA, recommended crossing RLE over L knee to thread pants. Assist needed to pull to waist line on R side.  Footwear: donned slide on shoes with MIN A to push heel down on R foot   Bathing: pt completed bathing from sitting position on TTB with overall MINA, good carryover of hemi techniques for bathing. Assist needed to wash back and LUE.   Transfers: pt completed stand pivot transfer from w/c>BSC over toilet to R side with MIN A, assist needed to manage RLE during transfer. Pt completed additional transfer to TTB to R side in same fashion with overall MINA.  Toileting: pt unable to void and needing MIN A for clothing mgmt on R side.      NMR:  Applied estim pads to R anterior aspect of wrist at level 14 to work on volitional wrist activation for ADL participation. When channel was activated pt instructed to grasp wash  cloth.  Used estim in conjunction with MAS with pt able to use gravity eliminated AD to bring wash cloth to mouth when channel was activated. Pt completed 15 mins at below parameters:     Ratio 1:3 Rate 35 pps Waveform- Asymmetric Ramp 1.0 Pulse 300  Additionally applied estim pads to R elbow in conjunction with MAS to work on elbow flexion/extension in gravity eliminated ROM. Pt even able to demo active elbow extension in MAS. Intensity set at level 12 for 10 mins at the below parameters:    Ratio 1:3 Rate 35 pps Waveform- Asymmetric Ramp 1.0 Pulse 300                    Ended session with pt supine in bed with all needs within reach and bed alarm activated.                    Therapy Documentation Precautions:  Precautions Precautions: Fall Precaution/Restrictions Comments: R-hemiplegia; Dysarthric Restrictions Weight Bearing Restrictions Per Provider Order: No  Pain: No pain    Therapy/Group: Individual Therapy  Ronal Gift [facility] 04/01/2024, 12:03 PM

## 2024-04-01 NOTE — Progress Notes (Signed)
 Physical Therapy Session Note  Patient Details  Name: Isaac Meza MRN: [id number] Date of Birth: 03/09/1971  Today's Date: 04/01/2024 PT Individual Time: 1000-1130 PT Individual Time Calculation (min): 90 min   Short Term Goals: Week 1:  PT Short Term Goal 1 (Week 1): Pt will complete sit to stand transfer with CGA consistently. PT Short Term Goal 2 (Week 1): Pt will complete bed to chair transfer with minA consistently. PT Short Term Goal 3 (Week 1): Pt will ambulate x50' with minA and LRAD. PT Short Term Goal 4 (Week 1): Pt will complete x2 steps with LHR and minA.  Skilled Therapeutic Interventions/Progress Updates:     Pt received semi reclined in bed and agrees to therapy. No complaint of pain. Pt performs bed mobility with cues for positioning at EOB. Stand pivot transfer to [facility] with minA/modA and cues for sequencing and positioning. WC transport to gym. Pt transfers to mat table with same cues and assist. Pt completes NMR for Rt hemibody, tasked with performing standing foot taps on colored circles. PT provides modA and pt does not utilize upper extremity support, encouraging loading through Rt hemibody. Pt able to engage muscles of RLE and does not have any overt buckling, though PT does provide consistent tactile support and feedback and Rt distal quad. Pt completes multiple bouts with similar assistance and cues.   Pt then stands and ambulates x40' with RW and modA. Pt able to progress RLE without physical assistance, and also does not require blocking of Rt knee. Pt tends to have a crossover gait pattern and occasionally does not clear RLE during swing phase. Following rest break pt ambulates x60' with similar assistance and cues, as well as cueing to maintain wider stance with RLE to prevent Rt step approaching midline or even scissoring type gait pattern.   Pt completes supine therex for NMR of RLE. Pt performs x15 quad sets, glute sets, and 3x10 bridges in hooklying. PT provides  tactile cueing through RLE for NM feedback and improved contraction.   Pt completes Nustep for reciprocal coordination training. Pt completes x15:00 at workload of 2 with average steps per minute ~15. PT cues pt to only utilize legs to emphasize NMR through RLE. PT utilizes hip abduction.   Pt transfers from Nustep>WC>bed with minA. Left supine in bed with all needs within reach.    Therapy Documentation Precautions:  Precautions Precautions: Fall Precaution/Restrictions Comments: R-hemiplegia; Dysarthric Restrictions Weight Bearing Restrictions Per Provider Order: No  Therapy/Group: Individual Therapy  Elsie JAYSON Dawn, PT, DPT 04/01/2024, 4:15 PM

## 2024-04-01 NOTE — Plan of Care (Signed)
" °  Problem: RH Expression Communication Goal: LTG Patient will verbally express basic/complex needs(SLP) Description: LTG:  Patient will verbally express basic/complex needs, wants or ideas with cues  (SLP) Flowsheets (Taken 04/01/2024 1644) LTG: Patient will verbally express basic/complex needs, wants or ideas (SLP): Minimal Assistance - Patient > 75% Note: Complex thoughts/ideas   "

## 2024-04-01 NOTE — Progress Notes (Signed)
 VASCULAR LAB    Right lower extremity venous duplex has been performed.  See CV proc for preliminary results.   Isaac Meza, RVT 04/01/2024, 5:45 PM

## 2024-04-02 DIAGNOSIS — D72829 Elevated white blood cell count, unspecified: Secondary | ICD-10-CM

## 2024-04-02 DIAGNOSIS — D649 Anemia, unspecified: Secondary | ICD-10-CM

## 2024-04-02 LAB — CBC WITH DIFFERENTIAL/PLATELET
Abs Immature Granulocytes: 0.07 K/uL (ref 0.00–0.07)
Basophils Absolute: 0.1 K/uL (ref 0.0–0.1)
Basophils Relative: 0 %
Eosinophils Absolute: 0.3 K/uL (ref 0.0–0.5)
Eosinophils Relative: 2 %
HCT: 31.2 % — ABNORMAL LOW (ref 39.0–52.0)
Hemoglobin: 10.7 g/dL — ABNORMAL LOW (ref 13.0–17.0)
Immature Granulocytes: 1 %
Lymphocytes Relative: 15 %
Lymphs Abs: 1.7 K/uL (ref 0.7–4.0)
MCH: 32.3 pg (ref 26.0–34.0)
MCHC: 34.3 g/dL (ref 30.0–36.0)
MCV: 94.3 fL (ref 80.0–100.0)
Monocytes Absolute: 1.1 K/uL — ABNORMAL HIGH (ref 0.1–1.0)
Monocytes Relative: 10 %
Neutro Abs: 8.2 K/uL — ABNORMAL HIGH (ref 1.7–7.7)
Neutrophils Relative %: 72 %
Platelets: 442 K/uL — ABNORMAL HIGH (ref 150–400)
RBC: 3.31 MIL/uL — ABNORMAL LOW (ref 4.22–5.81)
RDW: 12.6 % (ref 11.5–15.5)
WBC: 11.3 K/uL — ABNORMAL HIGH (ref 4.0–10.5)
nRBC: 0 % (ref 0.0–0.2)

## 2024-04-02 NOTE — Progress Notes (Signed)
 Speech Language Pathology Daily Session Note  Patient Details  Name: Isaac Meza MRN: [id number] Date of Birth: [birth year]  Today's Date: 04/02/2024 SLP Individual Time: 0730-0828 SLP Individual Time Calculation (min): 58 min  Short Term Goals: Week 1: SLP Short Term Goal 1 (Week 1): Patient will demonstrate moderate complexity problem solving given sup multimodal A SLP Short Term Goal 2 (Week 1): Patient will increase speech intelligibility to 60% at the phrase level given min multimodal A SLP Short Term Goal 3 (Week 1): Patient will recall SLOP speech intelligibility strategies with sup A  Skilled Therapeutic Interventions: SLP conducted skilled therapy session targeting cognition and communication goals. Upon SLP entry, patient taking medications whole with thin liquids. With large pill, he had coughing episode and expectorated pill from the pharynx, though was successful with swallowing pill on second attempt. He notes that this happens with his two large pills daily, and SLP recommends placing large pills in applesauce to assist with smooth pharyngeal transit. Patient is ~60% intelligible during structured conversation and utilizes SLOP speech intelligibility strategies 20% of the time with supervision (use of external room aid), increasing to 80% given min cues during structured phrase-level tasks. Patient completed 5 sets of 5 repetitions with EMST set to ~55 cmH2O with supervision cues. SLP provided education re: basal ganglia and its impact on speech loudness and sensory regulation. Patient verbalized understanding. To target problem solving goals, patient completed moderately complex deductive reasoning task with min assist for organization and sequencing. He then completed mildly complex letter sequencing task with supervision and utilized SLOP strategies at the generative sentence level with min cues to achieve 60-75% intelligibility. Patient was left in room with call bell in reach and  alarm set. SLP will continue to target goals per plan of care.        Pain  None  Therapy/Group: Individual Therapy  Mikaella Escalona, M.A., CCC-SLP  Ronie Fleeger A Cristofher Livecchi 04/02/2024, 8:28 AM

## 2024-04-02 NOTE — Plan of Care (Signed)
" °  Problem: Consults Goal: RH STROKE PATIENT EDUCATION Description: See Patient Education module for education specifics  Outcome: Progressing   Problem: RH BOWEL ELIMINATION Goal: RH STG MANAGE BOWEL WITH ASSISTANCE Description: STG Manage Bowel with supervision Assistance. Outcome: Progressing   Problem: RH BLADDER ELIMINATION Goal: RH STG MANAGE BLADDER WITH ASSISTANCE Description: STG Manage Bladder With supervision Assistance Outcome: Progressing   Problem: RH SKIN INTEGRITY Goal: RH STG SKIN FREE OF INFECTION/BREAKDOWN Description: Manage skin free of infection with supervision assistance Outcome: Progressing   Problem: RH SAFETY Goal: RH STG ADHERE TO SAFETY PRECAUTIONS W/ASSISTANCE/DEVICE Description: STG Adhere to Safety Precautions With Assistance/Device. Outcome: Progressing   Problem: RH PAIN MANAGEMENT Goal: RH STG PAIN MANAGED AT OR BELOW PT'S PAIN GOAL Description: <45 w/ prns Outcome: Progressing   Problem: RH KNOWLEDGE DEFICIT Goal: RH STG INCREASE KNOWLEDGE OF HYPERTENSION Description: Manage increase  knowledge of hypertension from wife and family with supervision assistance using educational materials provided Outcome: Progressing Goal: RH STG INCREASE KNOWLEGDE OF HYPERLIPIDEMIA Description: Manage increase  knowledge of hyperlipidemia from wife and family with supervision assistance using educational materials provided Outcome: Progressing Goal: RH STG INCREASE KNOWLEDGE OF STROKE PROPHYLAXIS Description: Manage increase  knowledge of stroke prophylaxis from wife and family with supervision assistance using educational materials provided Outcome: Progressing   "

## 2024-04-02 NOTE — Progress Notes (Signed)
 Occupational Therapy Session Note  Patient Details  Name: Takoda Janowiak MRN: [id number] Date of Birth: 1970/07/18  Today's Date: 04/02/2024 OT Individual Time: 1310-1420 OT Individual Time Calculation (min): 70 min    Short Term Goals: Week 1:  OT Short Term Goal 1 (Week 1): Pt will complete LB dressing with Min A. OT Short Term Goal 2 (Week 1): Pt will perform toilet transfer with consistent Min A + LRAD. OT Short Term Goal 3 (Week 1): Pt will standing balance during ADLs with Min A + LRAD.  Skilled Therapeutic Interventions/Progress Updates:    Pt received supine with no c/o pain, agreeable to OT session. He came to EOB with cueing required to bring the RLE to EOB, but (S) overall. Squat pivot with min A to the w/c. Stand pivot to the toilet with cueing for RLE placement flat on the floor, attempting to start transfer with foot bent underneath him, min A for transfer. Provided edu/cueing on BOS and increasing to provide stability when releasing the LUE from the grab bar to complete clothing management. He required min A at the trunk to doff LB clothing. He sat for several minutes to attempt and void BM. He was able to void BM and complete peri hygiene while seated with (S). He completed stand pivot to the w/c and then into the shower to the TTB with min A each time. Poor eccentric control when lowering to chair. He completed UB bathing with (S) using hemi techniques. Lateral leans used for LB bathing, with CGA. He completed transfer back to the w/c following. He completed grooming tasks seated at the sink with set up assist. Ub dressing with (S)- excellent carryover of hemi techniques. He required min cueing for figure 4 technique and then he was able to complete LB dressing with just min A for standing balance support. Pt was then instructed in hemi w/c propulsion and with mod cueing he was able to propel to the therapy gym with just (S). He completed multiple activities in tall kneeling,  reaching up and to the R while OT provided facilitated weightbearing through the RUE into extension and he worked on postural control and glute activation, all for NMR of RUE/LE for maximal independence in ADLs/transfers. He required min facilitation at the trunk. He had excellent carryover and effort in all tasks. He worked on resistive hip extension in tall kneeling with level 3 resistance band, hinging at the hips with CGA for carryover to improved postural control and glute strength in standing. He returned to the w/c following and was taken back to his room. He transferred back to bed with min A. He was left supine with all needs met. Bed alarm set.   Saebo Stim One was placed on his R bicep to increase muscle activation and stimulation to the limb for increased NMR. 60 min unattended e stim tolerated. No adverse pain or skin reaction.  330 pulse width 35 Hz pulse rate On 8 sec/ off 8 sec Ramp up/ down 2 sec Symmetrical Biphasic wave form  Max intensity at 500 Ohm load   Therapy Documentation Precautions:  Precautions Precautions: Fall Precaution/Restrictions Comments: R-hemiplegia; Dysarthric Restrictions Weight Bearing Restrictions Per Provider Order: No  Therapy/Group: Individual Therapy  [name] 04/02/2024, 7:31 AM

## 2024-04-02 NOTE — Progress Notes (Signed)
 Occupational Therapy Session Note  Patient Details  Name: Isaac Meza MRN: [id number] Date of Birth: 1970-09-09  Today's Date: 04/02/2024 OT Individual Time: 9094-9069 OT Individual Time Calculation (min): 25 min    Short Term Goals: Week 1:  OT Short Term Goal 1 (Week 1): Pt will complete LB dressing with Min A. OT Short Term Goal 2 (Week 1): Pt will perform toilet transfer with consistent Min A + LRAD. OT Short Term Goal 3 (Week 1): Pt will standing balance during ADLs with Min A + LRAD.  Skilled Therapeutic Interventions/Progress Updates:  Pt greeted resting in bed, no reports of pain. Session focused on RUE NMR at bed-level (due to short session.) NextWave NMES retrieved/utilized during session, targeting R elbow flexion/extension for carryover into ADL integration. Pt completes 1x10 reps of the above exercises with strong activation present at levels ranging from 13-17 (greater difficulty with elbow extension). Pt remained resting in bed with all immediate needs met, call bell within reach, and door closed.   Therapy Documentation Precautions:  Precautions Precautions: Fall Precaution/Restrictions Comments: R-hemiplegia; Dysarthric Restrictions Weight Bearing Restrictions Per Provider Order: No   Therapy/Group: Individual Therapy  Nereida Habermann, OTR/L, MSOT  04/02/2024, 5:28 AM

## 2024-04-02 NOTE — Progress Notes (Signed)
 Physical Therapy Session Note  Patient Details  Name: Isaac Meza MRN: [id number] Date of Birth: March 16, 1971  Today's Date: 04/02/2024 PT Individual Time: 8994-8884 PT Individual Time Calculation (min): 70 min   Short Term Goals: Week 1:  PT Short Term Goal 1 (Week 1): Pt will complete sit to stand transfer with CGA consistently. PT Short Term Goal 2 (Week 1): Pt will complete bed to chair transfer with minA consistently. PT Short Term Goal 3 (Week 1): Pt will ambulate x50' with minA and LRAD. PT Short Term Goal 4 (Week 1): Pt will complete x2 steps with LHR and minA.  Skilled Therapeutic Interventions/Progress Updates:     Pt received semi reclined in bed and agrees to therapy. No copmlaint of pain. Supine to sit with cues for use of bed features and positioning at EOB. Stand pivot to [facility] with minA and cues for sequencing. WC transport to gym. Pt steps up onto treadmill with modA and cues for safe sequencing. Pt completes bodyweight supported gait training on treadmill with litegait. Pt stands with CGA as PT helps to don harness. Pt then ambulates following bouts:  50' at .03 mph 50' at 0.4 mph  PT adds ace wrap to promote dorsiflexion and eversion, and also ace wraps Rt hand to promote grip.   100' at 0.4 mph - Pt has much improved gait pattern with addition of ace wrap support. PT provides modA tot totalA for progression of RLE, depending on pt's ability to complete without gross compensations. PT also provides tactile facilitation of Rt knee extension during loading phase and midstance.  Pt requires modA to step down from treadmill with cues for step sequencing. WC transport to Nustep. Pt transfers to Nustep with minA. Pt completes for NMR of RLE without use of upper extremities. PT provides cues for foot placement and utilizes thigh stabilization bar to maintain RLE in neutral hip rotation. Pt completes x14:00 total with several rest breaks.   Stand pivot from Nustep>WC>bed with  minA. Left supine with all needs within reach.  Therapy Documentation Precautions:  Precautions Precautions: Fall Precaution/Restrictions Comments: R-hemiplegia; Dysarthric Restrictions Weight Bearing Restrictions Per Provider Order: No   Therapy/Group: Individual Therapy  Elsie JAYSON Dawn, PT, DPT 04/02/2024, 3:44 PM

## 2024-04-02 NOTE — Progress Notes (Signed)
 "                                                        PROGRESS NOTE   Subjective/Complaints:  Pt doing well, slept well, LBM 2d ago per pt but not documented, urinating fine. No other complaints or concerns.   ROS: Denies fevers, chills, N/V, abdominal pain, constipation, diarrhea, SOB, cough, chest pain, new weakness or paraesthesias.   + Right hip pain--improved + Cough  Objective:   DG CHEST PORT 1 VIEW Result Date: 04/02/2024 EXAM: 1 VIEW(S) XRAY OF THE CHEST 04/01/2024 11:47:14 AM COMPARISON: None available. CLINICAL HISTORY: 220523 Productive cough 220523 FINDINGS: LUNGS AND PLEURA: Elevated left hemidiaphragm. Bibasilar scarring or atelectasis. No focal pulmonary opacity. No pleural effusion. No pneumothorax. HEART AND MEDIASTINUM: No acute abnormality of the cardiac and mediastinal silhouettes. BONES AND SOFT TISSUES: T4 vertebroplasty. Old healed bilateral rib fractures. IMPRESSION: 1. No acute cardiopulmonary abnormality. 2. Elevated left hemidiaphragm with bibasilar scarring or atelectasis. Electronically signed by: Greig Pique MD 04/02/2024 12:40 AM EST RP Workstation: [id number]   VAS US  LOWER EXTREMITY VENOUS (DVT) Result Date: 04/01/2024  Lower Venous DVT Study Patient Name:  Isaac Meza  Date of Exam:   04/01/2024 Medical Rec #: [id number]        Accession #:    [id number] Date of Birth: June 05, 1970        Patient Gender: M Patient Age:   53 years Exam Location:  [facility] Procedure:      VAS US  LOWER EXTREMITY VENOUS (DVT) Referring Phys: JOESPH LIKES --------------------------------------------------------------------------------  Indications: Right lateral thigh pain.  Risk Factors: Recent CVA, now in the rehabilitation unit. Comparison Study: No prior study Performing Technologist: Alberta Lis RVS  Examination Guidelines: A complete evaluation includes B-mode imaging, spectral Doppler, color Doppler, and power Doppler as needed of all accessible portions of  each vessel. Bilateral testing is considered an integral part of a complete examination. Limited examinations for reoccurring indications may be performed as noted. The reflux portion of the exam is performed with the patient in reverse Trendelenburg.  +---------+---------------+---------+-----------+----------+--------------+ RIGHT    CompressibilityPhasicitySpontaneityPropertiesThrombus Aging +---------+---------------+---------+-----------+----------+--------------+ CFV      Full           Yes      Yes                                 +---------+---------------+---------+-----------+----------+--------------+ SFJ      Full                                                        +---------+---------------+---------+-----------+----------+--------------+ FV Prox  Full                                                        +---------+---------------+---------+-----------+----------+--------------+ FV Mid   Full                                                        +---------+---------------+---------+-----------+----------+--------------+  FV DistalFull                                                        +---------+---------------+---------+-----------+----------+--------------+ PFV      Full                                                        +---------+---------------+---------+-----------+----------+--------------+ POP      Full           No       Yes                                 +---------+---------------+---------+-----------+----------+--------------+ PTV      Full                                                        +---------+---------------+---------+-----------+----------+--------------+ PERO     Full                                                        +---------+---------------+---------+-----------+----------+--------------+   +----+---------------+---------+-----------+----------+--------------+  LEFTCompressibilityPhasicitySpontaneityPropertiesThrombus Aging +----+---------------+---------+-----------+----------+--------------+ CFV Full           Yes      Yes                                 +----+---------------+---------+-----------+----------+--------------+ SFJ Full                                                        +----+---------------+---------+-----------+----------+--------------+    Summary: RIGHT: - There is no evidence of deep vein thrombosis in the lower extremity.  - No cystic structure found in the popliteal fossa.  LEFT: - No evidence of common femoral vein obstruction.   *See table(s) above for measurements and observations.    Preliminary    Recent Labs    04/01/24 0605 04/02/24 0627  WBC 12.2* 11.3*  HGB 11.0* 10.7*  HCT 31.4* 31.2*  PLT 467* 442*   Recent Labs    04/01/24 0605  NA 137  K 4.4  CL 103  CO2 25  GLUCOSE 90  BUN 8  CREATININE 0.67  CALCIUM  9.1    Intake/Output Summary (Last 24 hours) at 04/02/2024 1032 Last data filed at 04/02/2024 0800 Gross per 24 hour  Intake 480 ml  Output 1050 ml  Net -570 ml        Physical Exam: Vital Signs Blood pressure 137/89, pulse 77, temperature 98 F (36.7 C), resp. rate 18, height 5' 8 (1.727 m), weight 54.5 kg, SpO2 100%.  General: No acute distress.  Laying in bed. Comfortable appearing Mood and affect are appropriate, though a little flat Heart: Regular rate and rhythm no rubs murmurs or extra sounds Lungs: CTAB no w/r/r, no cough during exam Abdomen: Positive bowel sounds, soft nontender to palpation, nondistended Extremities: No clubbing, cyanosis, or edema Skin: No evidence of breakdown, no evidence of rash + Bruising over right hip, flank-- not reassessed  PRIOR EXAMS: Neurologic:  Awake, alert, and oriented x 3.  Cognition intact. Cranial nerves II through XII intact motor strength is 5/5 in left  1+/5 RUE, 1+/5 RLE proximal and 0/5 distal   Sensory exam  normal sensation to light touch  in bilateral upper and lower extremities No ataxia. Tone: MAS 1+ right knee flexor only  Musculoskeletal:  + TTP over right GTB, nonradiating   Assessment/Plan: 1. Functional deficits which require 3+ hours per day of interdisciplinary therapy in a comprehensive inpatient rehab setting. Physiatrist is providing close team supervision and 24 hour management of active medical problems listed below. Physiatrist and rehab team continue to assess barriers to discharge/monitor patient progress toward functional and medical goals  Care Tool:  Bathing    Body parts bathed by patient: Chest, Right arm, Abdomen, Front perineal area, Right upper leg, Left upper leg, Right lower leg, Left lower leg, Face   Body parts bathed by helper: Left arm, Buttocks     Bathing assist Assist Level: Minimal Assistance - Patient > 75%     Upper Body Dressing/Undressing Upper body dressing   What is the patient wearing?: Pull over shirt    Upper body assist Assist Level: Minimal Assistance - Patient > 75%    Lower Body Dressing/Undressing Lower body dressing      What is the patient wearing?: Pants     Lower body assist Assist for lower body dressing: Minimal Assistance - Patient > 75%     Toileting Toileting    Toileting assist Assist for toileting: Minimal Assistance - Patient > 75%     Transfers Chair/bed transfer  Transfers assist     Chair/bed transfer assist level: Minimal Assistance - Patient > 75%     Locomotion Ambulation   Ambulation assist      Assist level: 2 helpers Assistive device:  (Lt handrail) Max distance: 30'   Walk 10 feet activity   Assist     Assist level: 2 helpers Assistive device:  (Left handrail)   Walk 50 feet activity   Assist Walk 50 feet with 2 turns activity did not occur: Safety/medical concerns         Walk 150 feet activity   Assist Walk 150 feet activity did not occur: Safety/medical  concerns         Walk 10 feet on uneven surface  activity   Assist Walk 10 feet on uneven surfaces activity did not occur: Safety/medical concerns         Wheelchair     Assist Is the patient using a wheelchair?: Yes Type of Wheelchair: Manual    Wheelchair assist level: Dependent - Patient 0% Max wheelchair distance: 150'    Wheelchair 50 feet with 2 turns activity    Assist        Assist Level: Dependent - Patient 0%   Wheelchair 150 feet activity     Assist      Assist Level: Dependent - Patient 0%   Blood pressure 137/89, pulse 77, temperature 98 F (36.7 C), resp. rate 18, height 5' 8 (1.727  m), weight 54.5 kg, SpO2 100%.  Medical Problem List and Plan: 1. 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 A little impulsive/quick to move, but overall did well with PT.   2.  Antithrombotics: -DVT/anticoagulation:  Mechanical: Antiembolism stockings, thigh (TED hose) Bilateral lower extremities -antiplatelet therapy: Aspirin  81 mg daily and Brilinta  90 mg twice daily x 4 weeks then aspirin  alone - 12/26: Right lower extremity duplex due to mild leukocytosis, pain and range of motion deficit. Neg for DVT 12/27  3. Pain Management: Oxycodone  5 mg every 6 hours as needed 12/24: Add Tylenol  650 mg Q6H PRN for mild to moderate pain, baclofen  5 mg TID PRN for spasms.  Lidoderm  patch over right hip for GTB pain. 12/25: Pain continues, not using PRNs, continue current management; improved  4. Mood/Behavior/Sleep: Provide emotional support             -antipsychotic agents: N/A 5. Neuropsych/cognition: This patient is capable of making decisions on his own behalf. 6. Skin/Wound Care: Routine skin checks  - Monitor diffuse  bruising on right flank, stable   - 12/25: DC IV  7. Fluids/Electrolytes/Nutrition: Routine in and outs with follow-up chemistries, continue vitamins/supplements  - Admission labs stable; repeat 12/26--stable  8.  Hyperlipidemia.  Crestor  20mg  daily 9.  History of alcohol as well as tobacco use.  Continue Nicorette  gum prn.  Latest alcohol level 155 03/17/2024.  Provide counseling  10.  Hypertension.  Home medications amlodipine  5 mg daily, lisinopril  20 mg daily.  Goal BP less than 220/110.  Per discharge, gradually resume antihypertensive over next 2 weeks based on BP. 12/23: Monitor with therapies today, will resume lisinopril  at low-dose 5 mg in a.m.  12/24-27: normotensive - monitor  Vitals:   03/29/24 1256 03/29/24 2016 03/30/24 0631 03/30/24 1300  BP: 121/86 138/88 132/84 110/70   03/30/24 2111 03/31/24 0609 03/31/24 1244 03/31/24 1940  BP: 136/80 121/63 111/77 111/66   04/01/24 0551 04/01/24 1508 04/01/24 2021 04/02/24 0602  BP: 120/77 110/64 129/76 137/89     11.  Anemia.  Hemoglobin 12.1->10.7 on admission.  FOBT, H&H in AM.    - Continue aspirin  and Brilinta  for now 12/24: h/h stable 10.7; FOBT pending 12-26 hemoglobin stable, 11--> 10.7 12/27  12.  Thrombocytosis.  Likely reactive, trend.  - Staying in the 400s; monitor  13. Bowel incontinence/patient. Timed toiletting.   - Last bowel movement 12-23: Start Colace 100 mg twice daily   - 12/26: add sennakot 8.6mg  nightly  -04/02/24 LBM 2d ago per pt, not documented, if no BM by tomorrow, consider additional meds.  14.  Leukocytosis.  12.3, no fevers or other signs of infection - will get bilateral lower extremity duplex--neg 12/27 - repeat CXR--> nonacute  - repeat labs 12/27--> improving WBC 11.3, afebrile; monitor   LOS: 5 days A FACE TO FACE EVALUATION WAS PERFORMED  [address] Jonathen Rathman 04/02/2024, 10:32 AM     "

## 2024-04-03 NOTE — Progress Notes (Signed)
 "                                                        PROGRESS NOTE   Subjective/Complaints:  Pt doing well again today, slept well, pain well managed, LBM yesterday per pt but not documented, urinating fine. No other complaints or concerns.   ROS: Denies fevers, chills, N/V, abdominal pain, constipation, diarrhea, SOB, cough, chest pain, new weakness or paraesthesias.   + Right hip pain--improved + Cough  Objective:   VAS US  LOWER EXTREMITY VENOUS (DVT) Result Date: 04/03/2024  Lower Venous DVT Study Patient Name:  JUNO Basista  Date of Exam:   04/01/2024 Medical Rec #: [id number]        Accession #:    [id number] Date of Birth: 1970/05/12        Patient Gender: M Patient Age:   53 years Exam Location:  [facility] Procedure:      VAS US  LOWER EXTREMITY VENOUS (DVT) Referring Phys: JOESPH LIKES --------------------------------------------------------------------------------  Indications: Right lateral thigh pain.  Risk Factors: Recent CVA, now in the rehabilitation unit. Comparison Study: No prior study Performing Technologist: Alberta Lis RVS  Examination Guidelines: A complete evaluation includes B-mode imaging, spectral Doppler, color Doppler, and power Doppler as needed of all accessible portions of each vessel. Bilateral testing is considered an integral part of a complete examination. Limited examinations for reoccurring indications may be performed as noted. The reflux portion of the exam is performed with the patient in reverse Trendelenburg.  +---------+---------------+---------+-----------+----------+--------------+ RIGHT    CompressibilityPhasicitySpontaneityPropertiesThrombus Aging +---------+---------------+---------+-----------+----------+--------------+ CFV      Full           Yes      Yes                                 +---------+---------------+---------+-----------+----------+--------------+ SFJ      Full                                                         +---------+---------------+---------+-----------+----------+--------------+ FV Prox  Full                                                        +---------+---------------+---------+-----------+----------+--------------+ FV Mid   Full                                                        +---------+---------------+---------+-----------+----------+--------------+ FV DistalFull                                                        +---------+---------------+---------+-----------+----------+--------------+ PFV  Full                                                        +---------+---------------+---------+-----------+----------+--------------+ POP      Full           No       Yes                                 +---------+---------------+---------+-----------+----------+--------------+ PTV      Full                                                        +---------+---------------+---------+-----------+----------+--------------+ PERO     Full                                                        +---------+---------------+---------+-----------+----------+--------------+   +----+---------------+---------+-----------+----------+--------------+ LEFTCompressibilityPhasicitySpontaneityPropertiesThrombus Aging +----+---------------+---------+-----------+----------+--------------+ CFV Full           Yes      Yes                                 +----+---------------+---------+-----------+----------+--------------+ SFJ Full                                                        +----+---------------+---------+-----------+----------+--------------+    Summary: RIGHT: - There is no evidence of deep vein thrombosis in the lower extremity.  - No cystic structure found in the popliteal fossa.  LEFT: - No evidence of common femoral vein obstruction.   *See table(s) above for measurements and observations. Electronically signed by Debby Robertson  on 04/03/2024 at 10:59:45 AM.    Final    DG CHEST PORT 1 VIEW Result Date: 04/02/2024 EXAM: 1 VIEW(S) XRAY OF THE CHEST 04/01/2024 11:47:14 AM COMPARISON: None available. CLINICAL HISTORY: 220523 Productive cough 220523 FINDINGS: LUNGS AND PLEURA: Elevated left hemidiaphragm. Bibasilar scarring or atelectasis. No focal pulmonary opacity. No pleural effusion. No pneumothorax. HEART AND MEDIASTINUM: No acute abnormality of the cardiac and mediastinal silhouettes. BONES AND SOFT TISSUES: T4 vertebroplasty. Old healed bilateral rib fractures. IMPRESSION: 1. No acute cardiopulmonary abnormality. 2. Elevated left hemidiaphragm with bibasilar scarring or atelectasis. Electronically signed by: Greig Pique MD 04/02/2024 12:40 AM EST RP Workstation: [id number]   Recent Labs    04/01/24 0605 04/02/24 0627  WBC 12.2* 11.3*  HGB 11.0* 10.7*  HCT 31.4* 31.2*  PLT 467* 442*   Recent Labs    04/01/24 0605  NA 137  K 4.4  CL 103  CO2 25  GLUCOSE 90  BUN 8  CREATININE 0.67  CALCIUM  9.1    Intake/Output Summary (Last 24 hours) at 04/03/2024 1105 Last data filed at 04/03/2024 0816 Gross per 24 hour  Intake 720 ml  Output 1750 ml  Net -1030 ml        Physical Exam: Vital Signs Blood pressure 126/88, pulse 80, temperature (!) 97.5 F (36.4 C), resp. rate 18, height 5' 8 (1.727 m), weight 54.5 kg, SpO2 99%.  General: No acute distress.  Up in therapy. Comfortable appearing Mood and affect are appropriate, though a little flat Heart: Regular rate and rhythm no rubs murmurs or extra sounds Lungs: CTAB no w/r/r, no cough during exam Abdomen: Positive bowel sounds, soft nontender to palpation, nondistended Extremities: No clubbing, cyanosis, or edema Skin: No evidence of breakdown, no evidence of rash + Bruising over right hip, flank-- not reassessed  PRIOR EXAMS: Neurologic:  Awake, alert, and oriented x 3.  Cognition intact. Cranial nerves II through XII intact motor strength is 5/5  in left  1+/5 RUE, 1+/5 RLE proximal and 0/5 distal   Sensory exam normal sensation to light touch  in bilateral upper and lower extremities No ataxia. Tone: MAS 1+ right knee flexor only  Musculoskeletal:  + TTP over right GTB, nonradiating   Assessment/Plan: 1. Functional deficits which require 3+ hours per day of interdisciplinary therapy in a comprehensive inpatient rehab setting. Physiatrist is providing close team supervision and 24 hour management of active medical problems listed below. Physiatrist and rehab team continue to assess barriers to discharge/monitor patient progress toward functional and medical goals  Care Tool:  Bathing    Body parts bathed by patient: Chest, Right arm, Abdomen, Front perineal area, Right upper leg, Left upper leg, Right lower leg, Left lower leg, Face   Body parts bathed by helper: Left arm, Buttocks     Bathing assist Assist Level: Minimal Assistance - Patient > 75%     Upper Body Dressing/Undressing Upper body dressing   What is the patient wearing?: Pull over shirt    Upper body assist Assist Level: Minimal Assistance - Patient > 75%    Lower Body Dressing/Undressing Lower body dressing      What is the patient wearing?: Pants     Lower body assist Assist for lower body dressing: Minimal Assistance - Patient > 75%     Toileting Toileting    Toileting assist Assist for toileting: Minimal Assistance - Patient > 75%     Transfers Chair/bed transfer  Transfers assist     Chair/bed transfer assist level: Minimal Assistance - Patient > 75%     Locomotion Ambulation   Ambulation assist      Assist level: 2 helpers Assistive device:  (Lt handrail) Max distance: 30'   Walk 10 feet activity   Assist     Assist level: 2 helpers Assistive device:  (Left handrail)   Walk 50 feet activity   Assist Walk 50 feet with 2 turns activity did not occur: Safety/medical concerns         Walk 150 feet  activity   Assist Walk 150 feet activity did not occur: Safety/medical concerns         Walk 10 feet on uneven surface  activity   Assist Walk 10 feet on uneven surfaces activity did not occur: Safety/medical concerns         Wheelchair     Assist Is the patient using a wheelchair?: Yes Type of Wheelchair: Manual    Wheelchair assist level: Dependent - Patient 0% Max wheelchair distance: 150'    Wheelchair 50 feet with 2 turns activity    Assist  Assist Level: Dependent - Patient 0%   Wheelchair 150 feet activity     Assist      Assist Level: Dependent - Patient 0%   Blood pressure 126/88, pulse 80, temperature (!) 97.5 F (36.4 C), resp. rate 18, height 5' 8 (1.727 m), weight 54.5 kg, SpO2 99%.  Medical Problem List and Plan: 1. 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 A little impulsive/quick to move, but overall did well with PT.   2.  Antithrombotics: -DVT/anticoagulation:  Mechanical: Antiembolism stockings, thigh (TED hose) Bilateral lower extremities -antiplatelet therapy: Aspirin  81 mg daily and Brilinta  90 mg twice daily x 4 weeks then aspirin  alone - 12/26: Right lower extremity duplex due to mild leukocytosis, pain and range of motion deficit. Neg for DVT 12/27  3. Pain Management: Oxycodone  5 mg every 6 hours as needed 12/24: Add Tylenol  650 mg Q6H PRN for mild to moderate pain, baclofen  5 mg TID PRN for spasms.  Lidoderm  patch over right hip for GTB pain. 12/25: Pain continues, not using PRNs, continue current management; improved  4. Mood/Behavior/Sleep: Provide emotional support             -antipsychotic agents: N/A 5. Neuropsych/cognition: This patient is capable of making decisions on  his own behalf. 6. Skin/Wound Care: Routine skin checks  - Monitor diffuse bruising on right flank, stable   - 12/25: DC IV  7. Fluids/Electrolytes/Nutrition: Routine in and outs with follow-up chemistries, continue vitamins/supplements  - Admission labs stable; repeat 12/26--stable  8.  Hyperlipidemia.  Crestor  20mg  daily 9.  History of alcohol as well as tobacco use.  Continue Nicorette  gum prn.  Latest alcohol level 155 03/17/2024.  Provide counseling  10.  Hypertension.  Home medications amlodipine  5 mg daily, lisinopril  20 mg daily.  Goal BP less than 220/110.  Per discharge, gradually resume antihypertensive over next 2 weeks based on BP. 12/23: Monitor with therapies today, will resume lisinopril  at low-dose 5 mg in a.m.  12/24-28: normotensive - monitor  Vitals:   03/30/24 1300 03/30/24 2111 03/31/24 0609 03/31/24 1244  BP: 110/70 136/80 121/63 111/77   03/31/24 1940 04/01/24 0551 04/01/24 1508 04/01/24 2021  BP: 111/66 120/77 110/64 129/76   04/02/24 0602 04/02/24 1516 04/02/24 2024 04/03/24 0542  BP: 137/89 114/71 (!) 118/91 126/88     11.  Anemia.  Hemoglobin 12.1->10.7 on admission.  FOBT, H&H in AM.    - Continue aspirin  and Brilinta  for now 12/24: h/h stable 10.7; FOBT pending 12-26 hemoglobin stable, 11--> 10.7 12/27  12.  Thrombocytosis.  Likely reactive, trend.  - Staying in the 400s; monitor  13. Bowel incontinence/patient. Timed toiletting.   - Last bowel movement 12-23: Start Colace 100 mg twice daily   - 12/26: add sennakot 8.6mg  nightly  -04/03/24 LBM yesterday but not documented. Monitor.   14.  Leukocytosis.  12.3, no fevers or other signs of infection - will get bilateral lower extremity duplex--neg 12/27 - repeat CXR--> nonacute  - repeat labs 12/27--> improving WBC 11.3, afebrile; monitor   LOS: 6 days A FACE TO FACE EVALUATION WAS PERFORMED  [address] 04/03/2024, 11:05 AM     "

## 2024-04-03 NOTE — Progress Notes (Signed)
 Physical Therapy Session Note  Patient Details  Name: Isaac Meza MRN: [id number] Date of Birth: [birth year]  Today's Date: 04/03/2024 PT Individual Time: 8553-8471 PT Individual Time Calculation (min): 42 min   Short Term Goals: Week 1:  PT Short Term Goal 1 (Week 1): Pt will complete sit to stand transfer with CGA consistently. PT Short Term Goal 2 (Week 1): Pt will complete bed to chair transfer with minA consistently. PT Short Term Goal 3 (Week 1): Pt will ambulate x50' with minA and LRAD. PT Short Term Goal 4 (Week 1): Pt will complete x2 steps with LHR and minA.  Skilled Therapeutic Interventions/Progress Updates:     Pt semi-reclined in bed upon arrival. Pt denies pain and agreeable to therapy. Pt endorses fatigue from previous sessions. Rest breaks provided. Session emphasized functional strengthening, balance, and coordination/NMR with transfers and ambulation. PT donned ACE wrap to R LE to promote increased DF and eversion. PT also used ACE wrap to secure R UE to RW in grip position. Pt amb ~25 ft x2 trials with min A overall and verbal/tactile cueing for R LE advancement and safe placement of R foot as it tends to cross midline due to limited coordination. Pt attempted tor taps to 2 inch step, however, unable without compensation. Pt performed R foot taps to yellow dot placed on floor for improved coordination. Pt returned to room dependent in [facility] for time management. Pt transferred back to bed via squat pivot toward R with min A and VC for hand and foot placement. Pt laid back quickly into supine, requiring VC and max A to lift R LE into bed safely. Bed alarm set and all needs in reach at end of session.  Therapy Documentation Precautions:  Precautions Precautions: Fall Precaution/Restrictions Comments: R-hemiplegia; Dysarthric Restrictions Weight Bearing Restrictions Per Provider Order: No  Therapy/Group: Individual Therapy  Comer CHRISTELLA Levora Comer Levora, PT,  DPT 04/03/2024, 7:37 AM

## 2024-04-03 NOTE — Progress Notes (Signed)
 Physical Therapy Session Note  Patient Details  Name: Isaac Meza MRN: [id number] Date of Birth: 1970-09-23  Today's Date: 04/03/2024 PT Individual Time: 0850-1000 PT Individual Time Calculation (min): 70 min   Short Term Goals: Week 1:  PT Short Term Goal 1 (Week 1): Pt will complete sit to stand transfer with CGA consistently. PT Short Term Goal 2 (Week 1): Pt will complete bed to chair transfer with minA consistently. PT Short Term Goal 3 (Week 1): Pt will ambulate x50' with minA and LRAD. PT Short Term Goal 4 (Week 1): Pt will complete x2 steps with LHR and minA.  Skilled Therapeutic Interventions/Progress Updates: Pt presented in bed agreeable to therapy. Pt denies pain at rest. Pt donned L shoe with set up and PTA donned R shoe total A for time management. Completed supine to sit with supervision and use of bed features. Pt required minA for donning L wrist splint. Pt completed stand step transfer to w/c with RW and light modA. Pt transported to day room for time management. Completed stand step transfer to mat with minA. Performed Sit to stand with LLE on 2in step for increased wt bearing and forced use of RLE. Pt then worked on investment banker, operational with RW 18ft x 3 with overall minA. PTA ace bandaging RLE for increased DF and inversion. Pt cued for TKE of RLE in stance phase with PTA tapping for facilitation. Participated in NuStep L2 x 5 min BLE only for reciprocal movement and forced use with pt avg 40 SPM. Completed stand step transfers with minA overall. Pt transported back to room at end of session and completed stand step with minA back to bed. Completed sit to supine with CGA and pt using UE to assist RLE onto bed. Pt left in bed at end of session with call bell within reach and needs met.      Therapy Documentation Precautions:  Precautions Precautions: Fall Precaution/Restrictions Comments: R-hemiplegia; Dysarthric Restrictions Weight Bearing Restrictions Per Provider Order:  No   Therapy/Group: Individual Therapy  Hartleigh Edmonston 04/03/2024, 12:56 PM

## 2024-04-03 NOTE — Progress Notes (Signed)
 Occupational Therapy Session Note  Patient Details  Name: Isaac Meza MRN: [id number] Date of Birth: [birth year]  Today's Date: 04/03/2024 OT Individual Time: 0400-0430 11:45-12:00, 15 Min OT Individual Time Calculation (min): 30 min     Short Term Goals: Week 1:  OT Short Term Goal 1 (Week 1): Pt will complete LB dressing with Min A. OT Short Term Goal 2 (Week 1): Pt will perform toilet transfer with consistent Min A + LRAD. OT Short Term Goal 3 (Week 1): Pt will standing balance during ADLs with Min A + LRAD.  Skilled Therapeutic Interventions/Progress Updates:     Initial Encounter:  Arrived to treatment session 30 mins late secondary to previous session carryover. I indicated that I would make up his time at the end of the day. The pt was in agreement with initiating NMR of the RUE for 15 mins in duration.  The pt was able to transfer from supine in bed to EOB with MinA using the arm of the RW. The pt tolerated manual manipulation of the scapular and surrounding structures, inclusive of the head, neck, shld, arm, forearm, and hand  to improve communication for gains in functional mobility during task performance. The pt indicated that the RUE didn't feel as flaccid.  I indicated that we would continue with NMR, core strength, and functional task performance  for the remainder of his time.   NMR, AROM, and Resistive Exercise: The patient was seated at w/c LOF at the time of return with family present. I was able to provide the patient with a hemi table for comfort and for AROM using the RUE. The pt tolerated PROM of the RUE to improve communication for gain with functional mobility. The pt indicated that he notice subtle movements with the extremity and was instructed to incorporate his sight as a secondary signal to the brain. The pt was provided a modified yellow sock to improve his attention to the RUE. The pt was engaged in an active resistance activity involving the RUE follow by  cocontraction for increase feed back.  At the end of the session, the patient remained at w/c LOF with all additional needs addressed. The pt's family was present during the session.   Therapy Documentation Precautions:  Precautions Precautions: Fall Precaution/Restrictions Comments: R-hemiplegia; Dysarthric Restrictions Weight Bearing Restrictions Per Provider Order: No  Therapy/Group: Individual Therapy  [name] 04/03/2024, 6:05 PM

## 2024-04-03 NOTE — Plan of Care (Signed)
" °  Problem: Consults Goal: RH STROKE PATIENT EDUCATION Description: See Patient Education module for education specifics  Outcome: Progressing   Problem: RH BOWEL ELIMINATION Goal: RH STG MANAGE BOWEL WITH ASSISTANCE Description: STG Manage Bowel with supervision Assistance. Outcome: Progressing   Problem: RH BLADDER ELIMINATION Goal: RH STG MANAGE BLADDER WITH ASSISTANCE Description: STG Manage Bladder With supervision Assistance Outcome: Progressing   Problem: RH SKIN INTEGRITY Goal: RH STG SKIN FREE OF INFECTION/BREAKDOWN Description: Manage skin free of infection with supervision assistance Outcome: Progressing   Problem: RH SAFETY Goal: RH STG ADHERE TO SAFETY PRECAUTIONS W/ASSISTANCE/DEVICE Description: STG Adhere to Safety Precautions With Assistance/Device. Outcome: Progressing   Problem: RH PAIN MANAGEMENT Goal: RH STG PAIN MANAGED AT OR BELOW PT'S PAIN GOAL Description: <45 w/ prns Outcome: Progressing   Problem: RH KNOWLEDGE DEFICIT Goal: RH STG INCREASE KNOWLEDGE OF HYPERTENSION Description: Manage increase  knowledge of hypertension from wife and family with supervision assistance using educational materials provided Outcome: Progressing Goal: RH STG INCREASE KNOWLEGDE OF HYPERLIPIDEMIA Description: Manage increase  knowledge of hyperlipidemia from wife and family with supervision assistance using educational materials provided Outcome: Progressing Goal: RH STG INCREASE KNOWLEDGE OF STROKE PROPHYLAXIS Description: Manage increase  knowledge of stroke prophylaxis from wife and family with supervision assistance using educational materials provided Outcome: Progressing   "

## 2024-04-03 NOTE — Progress Notes (Signed)
 Occupational Therapy Session Note  Patient Details  Name: Isaac Meza MRN: [id number] Date of Birth: 1971-01-06  Today's Date: 04/03/2024 OT Individual Time: 1300-1343 OT Individual Time Calculation (min): 43 min    Short Term Goals: Week 1:  OT Short Term Goal 1 (Week 1): Pt will complete LB dressing with Min A. OT Short Term Goal 2 (Week 1): Pt will perform toilet transfer with consistent Min A + LRAD. OT Short Term Goal 3 (Week 1): Pt will standing balance during ADLs with Min A + LRAD.   Skilled Therapeutic Interventions/Progress Updates:    Pt bed level at time of session, dysarthria limiting but reminded to slow down and communicated more efficiently. No pain. Supine > sit Supervision and squat pivot to the L side with MIN/MOD A. Declined all ADL. Transported to/from gym and focused on ROM activities for RUE including arm skate for shoulder flex/ext, horizontal abd/add, and elbow flex/ext with MAX hand over hand and therapist assist. Pt with max effort but minimal movement noted - some in shoulder and minimal digit flexion. Guided pt through [facility] for RUE forearm sup/pro, wrist flex/ext, and flex/ext all digits composite. OT applied Next[employer] NMES to R forearm wrist extensor group focused on grasp/release patterns. Skin in tact pre and post. Pt bed level at end of session alarm on call bell in reach.   Therapy Documentation Precautions:  Precautions Precautions: Fall Precaution/Restrictions Comments: R-hemiplegia; Dysarthric Restrictions Weight Bearing Restrictions Per Provider Order: No    Therapy/Group: Individual Therapy  Chiquita JAYSON Hopping 04/03/2024, 12:57 PM

## 2024-04-04 LAB — CBC WITH DIFFERENTIAL/PLATELET
Abs Immature Granulocytes: 0.05 K/uL (ref 0.00–0.07)
Basophils Absolute: 0 K/uL (ref 0.0–0.1)
Basophils Relative: 0 %
Eosinophils Absolute: 0.3 K/uL (ref 0.0–0.5)
Eosinophils Relative: 2 %
HCT: 31.1 % — ABNORMAL LOW (ref 39.0–52.0)
Hemoglobin: 10.8 g/dL — ABNORMAL LOW (ref 13.0–17.0)
Immature Granulocytes: 0 %
Lymphocytes Relative: 16 %
Lymphs Abs: 1.8 K/uL (ref 0.7–4.0)
MCH: 32.9 pg (ref 26.0–34.0)
MCHC: 34.7 g/dL (ref 30.0–36.0)
MCV: 94.8 fL (ref 80.0–100.0)
Monocytes Absolute: 1.1 K/uL — ABNORMAL HIGH (ref 0.1–1.0)
Monocytes Relative: 10 %
Neutro Abs: 7.9 K/uL — ABNORMAL HIGH (ref 1.7–7.7)
Neutrophils Relative %: 72 %
Platelets: 431 K/uL — ABNORMAL HIGH (ref 150–400)
RBC: 3.28 MIL/uL — ABNORMAL LOW (ref 4.22–5.81)
RDW: 12.2 % (ref 11.5–15.5)
WBC: 11.2 K/uL — ABNORMAL HIGH (ref 4.0–10.5)
nRBC: 0 % (ref 0.0–0.2)

## 2024-04-04 LAB — BASIC METABOLIC PANEL WITH GFR
Anion gap: 9 (ref 5–15)
BUN: 9 mg/dL (ref 6–20)
CO2: 26 mmol/L (ref 22–32)
Calcium: 9.1 mg/dL (ref 8.9–10.3)
Chloride: 101 mmol/L (ref 98–111)
Creatinine, Ser: 0.76 mg/dL (ref 0.61–1.24)
GFR, Estimated: >60 mL/min
Glucose, Bld: 111 mg/dL — ABNORMAL HIGH (ref 70–99)
Potassium: 4.4 mmol/L (ref 3.5–5.1)
Sodium: 136 mmol/L (ref 135–145)

## 2024-04-04 NOTE — Progress Notes (Signed)
 Occupational Therapy Weekly Progress Note  Patient Details  Name: Isaac Meza MRN: [id number] Date of Birth: 1971/02/14  Beginning of progress report period: 03/29/24 End of progress report period: 04/04/24  Patient has met 3 of 3 short term goals.  [name] has made excellent progress in his first week in CIR. He is highly motivated and adheres to all education and cueing. He can complete UB ADLs with (S) now, as well as LB with min A. He is completing squat pivots with CGA, stand pivots with min A. His RUE has had some return in his bicep/tricep and anterior deltoid. Family education will be completed closer to d/c.   Patient continues to demonstrate the following deficits: muscle weakness, decreased cardiorespiratoy endurance, impaired timing and sequencing, unbalanced muscle activation, decreased coordination, and decreased motor planning, and decreased standing balance, decreased postural control, hemiplegia, and decreased balance strategies and therefore will continue to benefit from skilled OT intervention to enhance overall performance with BADL and iADL.  Patient progressing toward long term goals..  Continue plan of care.  OT Short Term Goals Week 1:  OT Short Term Goal 1 (Week 1): Pt will complete LB dressing with Min A. OT Short Term Goal 1 - Progress (Week 1): Met OT Short Term Goal 2 (Week 1): Pt will perform toilet transfer with consistent Min A + LRAD. OT Short Term Goal 2 - Progress (Week 1): Met OT Short Term Goal 3 (Week 1): Pt will standing balance during ADLs with Min A + LRAD. OT Short Term Goal 3 - Progress (Week 1): Met Week 2:  OT Short Term Goal 1 (Week 2): STG= LTG d/t ELOS  [name] 04/04/2024, 2:48 PM

## 2024-04-04 NOTE — Progress Notes (Signed)
 Occupational Therapy Session Note  Patient Details  Name: Isaac Meza MRN: [id number] Date of Birth: Jan 01, 1971  Session 1 Today's Date: 04/04/2024 OT Individual Time: 9166-9096 OT Individual Time Calculation (min): 30 min   Session 2  Today's Date: 04/04/2024 OT Individual Time: 8897-8795 OT Individual Time Calculation (min): 62 min    Short Term Goals: Week 1:  OT Short Term Goal 1 (Week 1): Pt will complete LB dressing with Min A. OT Short Term Goal 2 (Week 1): Pt will perform toilet transfer with consistent Min A + LRAD. OT Short Term Goal 3 (Week 1): Pt will standing balance during ADLs with Min A + LRAD.  Skilled Therapeutic Interventions/Progress Updates:    Session 1 Pt received supine with no c/o pain, agreeable to OT session focused on shower. He came to EOB with min cueing for attention to RLE, (S) with heavy bed feature use. Stand pivot EOB > w/c with CGA, requiring min facilitation to place the RLE. Stand pivot to [facility] over toilet with min A and again placement for RLE to ensure weightbearing during transfer and protect ankle. He was able to maintain balance statically to manage clothing with min A. Transfer to shower onto TTB with min A. He completed UB bathing with excellent carryover of hemi techniques to wash with (S). LB with CGA using lateral leans and grab bar. He completed transfer back out of shower to the w/c with CGA. Oral care with set up assist seated at the sink. He donned a shirt with (S), pants with CGA. Pt was left supine with all needs met, bed alarm set and call bell within reach.    Session 2 Pt received supine with no c/o pain agreeable to OT session. He came to EOB with (S). Squat pivot to the w/c with CGA. He hemi propelled the w/c to the therapy gym with (S), occasionally bumping into walls. He worked on building services engineer with dorsiflexion wrap applied to his RLE and with the RW with R orthosis. He required just min A overall for balance  and mod facilitation for RLE placement to avoid scissoring and excessive hip hiking. He completed 3 sets of 92 ft.  Saebo e-stim unit was placed on his L tricep to facilitate extension and weightbearing through the RW throughout. No adverse pain or skin reaction. He then worked on sit > stands from [facility] with no UE support to encourage RLE NMR d/t pt frequently standing on LLE only. He required mod A overall with blocking at the RLE but had excellent activation of the RLE. 3x8 repetitions. He transitioned into supine for bimanual AROM with mod facilitation at the R hand to maintain grasp on a dowel to facilitate shoulder flexion and tricep extension with use of saebo in conjunction. He demonstrated elbow flexion/ext with mod facilitation. He transitioned into sidelying and completed further elbow flex/ext to promote NMR. He returned to [facility] following and transferred back to the w/c with min A. Pt returned to his room following. Pt was left supine with all needs met, bed alarm set and call bell within reach.    Saebo Stim One was placed on his R dorsal wrist to increase finger and wrist extension activation and stimulation to the limb for increased NMR. 60 min unattended e stim tolerated. No adverse pain or skin reaction.  330 pulse width 35 Hz pulse rate On 8 sec/ off 8 sec Ramp up/ down 2 sec Symmetrical Biphasic wave form  Max intensity at 500 Ohm  load  Therapy Documentation Precautions:  Precautions Precautions: Fall Precaution/Restrictions Comments: R-hemiplegia; Dysarthric Restrictions Weight Bearing Restrictions Per Provider Order: No   Therapy/Group: Individual Therapy  [name] 04/04/2024, 8:44 AM

## 2024-04-04 NOTE — Progress Notes (Signed)
 Orthopedic Tech Progress Note Patient Details:  Isaac Meza 04/03/1971 [id number] Called in AFO consult to [facility] Patient ID: Isaac Meza, male   DOB: [birth year], 53 y.o.   MRN: [id number]  Isaac Meza 04/04/2024, 2:11 PM

## 2024-04-04 NOTE — Progress Notes (Signed)
 Physical Therapy Session Note  Patient Details  Name: Isaac Meza MRN: [id number] Date of Birth: [birth year]  Today's Date: 04/04/2024 PT Individual Time: 8695-8655 PT Individual Time Calculation (min): 40 min   Short Term Goals: Week 1:  PT Short Term Goal 1 (Week 1): Pt will complete sit to stand transfer with CGA consistently. PT Short Term Goal 2 (Week 1): Pt will complete bed to chair transfer with minA consistently. PT Short Term Goal 3 (Week 1): Pt will ambulate x50' with minA and LRAD. PT Short Term Goal 4 (Week 1): Pt will complete x2 steps with LHR and minA.  Skilled Therapeutic Interventions/Progress Updates:     Pt received semi reclined in bed and agrees to therapy. No complaint of pain. Pt performs supine to sit with increased time and cues for positioning at EOB. Pt performs squat pivot transfer to WC with CGA. WC transport to gym. Pt ambulates x80' with RW and Rt hand splint, requiring minA/modA, with consistent manual facilitation of Rt hand stability on RW, as well as cues for weight shifting and safe RW management. Pt noted to have difficulty managing and also consistent difficulty clearing RLE for swing phase, compensating with vaulting on LLE and adductor utilization for RLE. During rest break, PT ace wraps RLE to promote dorsiflexion and eversion. Pt then ambulates x100' without AD, with PT positioned under pt's RUE, and pt improving swing phase with RLE and able to complete stance phase without buckling through RLE. Seated rest break. Pt stands and ambulates x70' with minA and similar cues. WC transport back to room. Stand pivot to bed with CGA. Left supine with all needs within reach.  Therapy Documentation Precautions:  Precautions Precautions: Fall Precaution/Restrictions Comments: R-hemiplegia; Dysarthric Restrictions Weight Bearing Restrictions Per Provider Order: No  Therapy/Group: Individual Therapy  Elsie JAYSON Dawn, PT, DPT 04/04/2024, 4:02 PM

## 2024-04-04 NOTE — Progress Notes (Addendum)
 "                                                        PROGRESS NOTE   Subjective/Complaints:  No acute complaints, no concerns, vital stable, labs stable.  ROS: Denies fevers, chills, N/V, abdominal pain, constipation, diarrhea, SOB, cough, chest pain, new weakness or paraesthesias.   + Right hip pain--improved + Cough  Objective:   No results found.  Recent Labs    04/02/24 0627 04/04/24 0515  WBC 11.3* 11.2*  HGB 10.7* 10.8*  HCT 31.2* 31.1*  PLT 442* 431*   Recent Labs    04/04/24 0515  NA 136  K 4.4  CL 101  CO2 26  GLUCOSE 111*  BUN 9  CREATININE 0.76  CALCIUM  9.1    Intake/Output Summary (Last 24 hours) at 04/04/2024 0734 Last data filed at 04/04/2024 0530 Gross per 24 hour  Intake 720 ml  Output 750 ml  Net -30 ml        Physical Exam: Vital Signs Blood pressure (!) 141/80, pulse 78, temperature 98.8 F (37.1 C), temperature source Oral, resp. rate 17, height 5' 8 (1.727 m), weight 54.5 kg, SpO2 99%.  General: No acute distress.  Up in therapy. Comfortable appearing Mood and affect are appropriate, though a little flat Heart: Regular rate and rhythm no rubs murmurs or extra sounds Lungs: CTAB no w/r/r, no cough during exam Abdomen: Positive bowel sounds, soft nontender to palpation, nondistended Extremities: No clubbing, cyanosis, or edema Skin: No evidence of breakdown, no evidence of rash + Bruising over right hip, flank-- not reassessed  Neurologic:  Awake, alert, and oriented x 3.  Cognition intact. Cranial nerves II through XII intact motor strength is 5/5 in left  1+/5 RUE, 1+/5 RLE proximal and 0/5 distal   Sensory exam normal sensation to light touch  in bilateral upper and lower extremities No ataxia. Tone: MAS 1+ right knee flexor only  Musculoskeletal:  No apparent deformity  Physical exam unchanged from the above on reexamination 04/04/2024   Assessment/Plan: 1. Functional deficits which require 3+ hours per day of  interdisciplinary therapy in a comprehensive inpatient rehab setting. Physiatrist is providing close team supervision and 24 hour management of active medical problems listed below. Physiatrist and rehab team continue to assess barriers to discharge/monitor patient progress toward functional and medical goals  Care Tool:  Bathing    Body parts bathed by patient: Chest, Right arm, Abdomen, Front perineal area, Right upper leg, Left upper leg, Right lower leg, Left lower leg, Face   Body parts bathed by helper: Left arm, Buttocks     Bathing assist Assist Level: Minimal Assistance - Patient > 75%     Upper Body Dressing/Undressing Upper body dressing   What is the patient wearing?: Pull over shirt    Upper body assist Assist Level: Minimal Assistance - Patient > 75%    Lower Body Dressing/Undressing Lower body dressing      What is the patient wearing?: Pants     Lower body assist Assist for lower body dressing: Minimal Assistance - Patient > 75%     Toileting Toileting    Toileting assist Assist for toileting: Minimal Assistance - Patient > 75%     Transfers Chair/bed transfer  Transfers assist     Chair/bed transfer assist  level: Minimal Assistance - Patient > 75%     Locomotion Ambulation   Ambulation assist      Assist level: 2 helpers Assistive device:  (Lt handrail) Max distance: 30'   Walk 10 feet activity   Assist     Assist level: 2 helpers Assistive device:  (Left handrail)   Walk 50 feet activity   Assist Walk 50 feet with 2 turns activity did not occur: Safety/medical concerns         Walk 150 feet activity   Assist Walk 150 feet activity did not occur: Safety/medical concerns         Walk 10 feet on uneven surface  activity   Assist Walk 10 feet on uneven surfaces activity did not occur: Safety/medical concerns         Wheelchair     Assist Is the patient using a wheelchair?: Yes Type of Wheelchair:  Manual    Wheelchair assist level: Dependent - Patient 0% Max wheelchair distance: 150'    Wheelchair 50 feet with 2 turns activity    Assist        Assist Level: Dependent - Patient 0%   Wheelchair 150 feet activity     Assist      Assist Level: Dependent - Patient 0%   Blood pressure (!) 141/80, pulse 78, temperature 98.8 F (37.1 C), temperature source Oral, resp. rate 17, height 5' 8 (1.727 m), weight 54.5 kg, SpO2 99%.  Medical Problem List and Plan: 1. 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 A little impulsive/quick to move, but overall did well with PT.    - 12/29: orthotics AFO consult  2.  Antithrombotics: -DVT/anticoagulation:  Mechanical: Antiembolism stockings, thigh (TED hose) Bilateral lower extremities -antiplatelet therapy: Aspirin  81 mg daily and Brilinta  90 mg twice daily x 4 weeks then aspirin  alone - 12/26: Right lower extremity duplex due to mild leukocytosis, pain and range of motion deficit. Neg for DVT 12/27  3. Pain Management: Oxycodone  5 mg every 6 hours as needed 12/24: Add Tylenol  650 mg Q6H PRN for mild to moderate pain, baclofen  5 mg TID PRN for spasms.  Lidoderm  patch over right hip for GTB pain. 12/25: Pain continues, not using PRNs, continue current management; improved  4. Mood/Behavior/Sleep: Provide emotional support             -antipsychotic agents: N/A 5. Neuropsych/cognition: This patient is capable of making decisions on his own behalf. 6. Skin/Wound Care: Routine skin checks  - Monitor diffuse bruising on right flank, stable   - 12/25: DC IV  7. Fluids/Electrolytes/Nutrition: Routine in and outs with follow-up chemistries, continue vitamins/supplements  - Admission labs stable;  repeat 12/26--stable  8.  Hyperlipidemia.  Crestor  20mg  daily 9.  History of alcohol as well as tobacco use.  Continue Nicorette  gum prn.  Latest alcohol level 155 03/17/2024.  Provide counseling  10.  Hypertension.  Home medications amlodipine  5 mg daily, lisinopril  20 mg daily.  Goal BP less than 220/110.  Per discharge, gradually resume antihypertensive over next 2 weeks based on BP. 12/23: Monitor with therapies today, will resume lisinopril  at low-dose 5 mg in a.m.  12/24-29:  normotensive - monitor  Vitals:   03/31/24 1244 03/31/24 1940 04/01/24 0551 04/01/24 1508  BP: 111/77 111/66 120/77 110/64   04/01/24 2021 04/02/24 0602 04/02/24 1516 04/02/24 2024  BP: 129/76 137/89 114/71 (!) 118/91   04/03/24 0542 04/03/24 1531 04/03/24 2007 04/04/24 0529  BP: 126/88 121/83 122/76 (!) 141/80     11.  Anemia.  Hemoglobin 12.1->10.7 on admission.  FOBT, H&H in AM.    - Continue aspirin  and Brilinta  for now 12/24: h/h stable 10.7; FOBT pending 12-26 hemoglobin stable, 11--> 10.7 12/27  12.  Thrombocytosis.  Likely reactive, trend.  - Staying in the 400s; monitor  13. Bowel incontinence/patient. Timed toiletting.   - Last bowel movement 12-23: Start Colace 100 mg twice daily   - 12/26: add sennakot 8.6mg  nightly  -04/03/24 LBM yesterday but not documented. Monitor.   14.  Leukocytosis.  12.3, no fevers or other signs of infection - will get bilateral lower extremity duplex--neg 12/27 - repeat CXR--> nonacute  - repeat labs 12/27--> improving WBC 11.3, afebrile; monitor  - Stable 12-29   LOS: 7 days A FACE TO FACE EVALUATION WAS PERFORMED  Isaac Meza 04/04/2024, 7:34 AM     "

## 2024-04-04 NOTE — Progress Notes (Signed)
 Patient ID: Isaac Meza, male   DOB: 03/29/1971, 53 y.o.   MRN: [id number]  SW faxed PCS evaluation to LTSS Dept with insurance [phone].   Graeme Jude, MSW, LCSW Office: [phone] Cell: [phone] Fax: [phone]

## 2024-04-04 NOTE — Progress Notes (Signed)
 Speech Language Pathology Daily Session Note  Patient Details  Name: Isaac Meza MRN: [id number] Date of Birth: 1970/07/09  Today's Date: 04/04/2024 SLP Individual Time: 1430-1530 SLP Individual Time Calculation (min): 60 min  Short Term Goals: Week 1: SLP Short Term Goal 1 (Week 1): Patient will demonstrate moderate complexity problem solving given sup multimodal A SLP Short Term Goal 2 (Week 1): Patient will increase speech intelligibility to 60% at the phrase level given min multimodal A SLP Short Term Goal 3 (Week 1): Patient will recall SLOP speech intelligibility strategies with sup A  Skilled Therapeutic Interventions:   Pt greeted at bedside for tx targeting cognition and communication. EMST completed via EMST150. Resistance increased to 60 cmH20. He benefited from supervisionA to complete 25 reps total and maintain adequate technique. SLP then challenged pt to a 10 word generative naming task and he was able to complete w/ only supervisionA. He required modA to utilize compensatory speech strategies and maintain ~80% intelligibility. During sentence formulation task (proverbs),cueing increased to maxA d/t rapid rate and lack of over articulation. Only supervisionA required for problem solving and reasoning throughout. He verbalized understanding of continued education re continued dysphonia and additional questions were answered. At the end of tx tasks, he was left in bed w/ the alarm set and call light within reach. Recommend cont ST per POC.   Pain  No pain reported  Therapy/Group: Individual Therapy  [name] 04/04/2024, 2:41 PM

## 2024-04-05 MED ORDER — DOCUSATE SODIUM 100 MG PO CAPS: 100.0000 mg | ORAL_CAPSULE | Freq: Two times a day (BID) | ORAL | Status: DC

## 2024-04-05 MED ORDER — ADULT MULTIVITAMIN W/MINERALS CH: 1.0000 | ORAL_TABLET | Freq: Every day | ORAL | Status: AC

## 2024-04-05 MED ORDER — ACETAMINOPHEN 325 MG PO TABS: 650.0000 mg | ORAL_TABLET | Freq: Four times a day (QID) | ORAL | Status: AC | PRN

## 2024-04-05 MED ORDER — BACLOFEN 5 MG HALF TABLET
5.0000 mg | ORAL_TABLET | Freq: Three times a day (TID) | ORAL | Status: DC
  Administered 2024-04-05 – 2024-04-07 (×6): 5 mg via ORAL
  Filled 2024-04-05 (×6): qty 1

## 2024-04-05 NOTE — Progress Notes (Signed)
 Patient ID: Isaac Meza, male   DOB: 04-18-1970, 53 y.o.   MRN: [id number]   SW received confirmation from insurance PCS referral was received.   *SW made efforts to meet with pt but in therapy session. SW will follow-up with updates.   Graeme Jude, MSW, LCSW Office: [phone] Cell: [phone] Fax: [phone]

## 2024-04-05 NOTE — Progress Notes (Signed)
 Occupational Therapy Session Note  Patient Details  Name: Isaac Meza MRN: [id number] Date of Birth: March 11, 1971  Today's Date: 04/05/2024 OT Individual Time: 0820-0900 OT Individual Time Calculation (min): 40 min   Today's Date: 04/05/2024 OT Individual Time: 8694-8665 OT Individual Time Calculation (min): 29 min   Short Term Goals: Week 2:  OT Short Term Goal 1 (Week 2): STG= LTG d/t ELOS  Skilled Therapeutic Interventions/Progress Updates:   Session 1: Pt greeted resting in bed for skilled OT session with focus on functional transfers, standing balance, and RLE NMR for carryover into safe functional transfers.   Pain: Pt with no reports of pain. OT offering intermediate rest breaks and positioning suggestions throughout session to address pain/fatigue and maximize participation/safety in session.   Functional Transfers: Stand-pivots during session with CGA-Min A (OT assuring safe RLE placement).   Self Care Tasks: Pt dons R wrist cock-up with moderate assistance. Educated on donning socks with one-handed technique, assistance provided for R-sock due to decreased frustration tolerance.   Therapeutic Exercise/Activities: Pt completes 1x10 reps of seated and standing forward/backward stepping with RLE. NMES applied to muscular on anterior side of distal RLE to promote dorsiflexion for improved clearance and decreased fall risk.    Pt remained resting in bed with 4Ps assessed and immediate needs met. Pt continues to be appropriate for skilled OT intervention to promote further functional independence in ADLs/IADLs.   Session 2: Pt greeted resting in bed for skilled OT session with focus on BADL retraining and functional transfers.   Pain: Pt with no reports of pain. OT offering intermediate rest breaks and positioning suggestions throughout session to address pain/fatigue and maximize participation/safety in session.   Functional Transfers: Ambulatory walk-in shower transfer  with Min A + RW.   Self Care Tasks: Pt completes the following self care tasks with levels of assistance noted below, UB: Bathing with assistance to thoroughly bathe LUE. Dressing with Min A due to decreased frustration tolerance and material entanglement.  LB: Bathing/dressing with Min A for standing balance with no UE standing balance.  Of note, patient requires heavy verbal cuing throughout session to manage impulsivity/decreased safety awareness with RUE/RLE.   Pt remained resting in bed with 4Ps assessed and immediate needs met. Pt continues to be appropriate for skilled OT intervention to promote further functional independence in ADLs/IADLs.   Therapy Documentation Precautions:  Precautions Precautions: Fall Precaution/Restrictions Comments: R-hemiplegia; Dysarthric Restrictions Weight Bearing Restrictions Per Provider Order: No    Therapy/Group: Individual Therapy  Nereida Habermann, OTR/L, MSOT  04/05/2024, 5:28 AM

## 2024-04-05 NOTE — Progress Notes (Signed)
 Speech Language Pathology Weekly Progress and Session Note  Patient Details  Name: Isaac Meza MRN: [id number] Date of Birth: [birth year]  Beginning of progress report period: March 29, 2024 End of progress report period: April 05, 2024  Today's Date: 04/05/2024 SLP Individual Time: 1330-1430 SLP Individual Time Calculation (min): 60 min  Short Term Goals: Week 1: SLP Short Term Goal 1 (Week 1): Patient will demonstrate moderate complexity problem solving given sup multimodal A SLP Short Term Goal 1 - Progress (Week 1): Met SLP Short Term Goal 2 (Week 1): Patient will increase speech intelligibility to 60% at the phrase level given min multimodal A SLP Short Term Goal 2 - Progress (Week 1): Met SLP Short Term Goal 3 (Week 1): Patient will recall SLOP speech intelligibility strategies with sup A SLP Short Term Goal 3 - Progress (Week 1): Met    New Short Term Goals: Week 2: SLP Short Term Goal 1 (Week 2): STGs = LTGs d/t ELOS  Weekly Progress Updates: Pt is making slow yet steady progress towards ST goals. He met 3/3 LTGs this week and demonstrates improving problem solving, recall of education/strategies, and speech intelligibility. He continues to require only supervisionA for cognition and mod-maxA for speech intelligibility. Only moderate deficits noted in structured tasks, though reduced generalization to conversation remains. Pt/education ongoing and he would benefit from continued ST to target cognitive-communication deficits, maximize pt independence, and reduce caregiver burden.   Intensity: Minumum of 1-2 x/day, 30 to 90 minutes Frequency: 3 to 5 out of 7 days Duration/Length of Stay: 1/6 Treatment/Interventions: Cognitive remediation/compensation;Speech/Language facilitation;Internal/external aids;Patient/family education;Therapeutic Exercise;Therapeutic Activities;Environmental controls;Functional tasks   Daily Session  Skilled Therapeutic Interventions:  Pt  greeted at bedside for tx targeting cognition and communication. SLP facilitated conversation re progress thus far and remaining barriers to meeting LTGs. He demonstrated adequate reasoning and planning for d/c. He then completed 25 reps of EMST via EMST150 @ 60 cmH2O independently. He then completed a verbal sequencing task and demonstrated adequate complex problem solving. He benefited from minA for speech intelligibility at sentence level. During complex responsive naming and object description task, he benefited from mod-maxA to maintain slow rate and over articulation. Only supervisionA required for specific word finding, however. He verbalized understanding of education re generalization of compensatory speech strategies to open ended tasks like the naming task and in conversations. Would benefit from continued education in upcoming tx sessions though given complexity of speech production. At the end of tx tasks, he was left in bed w/ the call light within reach. Recommend cont ST per POC.       Pain  7/10 back and hip pain. Politely declined   Therapy/Group: Individual Therapy  [name] 04/05/2024, 1:47 PM

## 2024-04-05 NOTE — Progress Notes (Signed)
 Physical Therapy Weekly Progress Note  Patient Details  Name: Isaac Meza MRN: [id number] Date of Birth: [birth year]  Beginning of progress report period: March 29, 2024 End of progress report period: April 05, 2024  Today's Date: 04/05/2024 PT Individual Time: 1032-1128 PT Individual Time Calculation (min): 56 min   Patient has met 2 of 3 short term goals. Pt is progressing well toward mobility goals, improving independence with bed mobility, balance, transfers, and ambulation. Pt continues to require minA at times due to Rt hemiplegia and losses of balance during transfers and standing activities. Pt will benefit from hands on family education prior to discharge.   Patient continues to demonstrate the following deficits muscle weakness, decreased cardiorespiratoy endurance, decreased coordination and decreased motor planning, and decreased sitting balance, decreased standing balance, hemiplegia, and decreased balance strategies and therefore will continue to benefit from skilled PT intervention to increase functional independence with mobility.  Patient progressing toward long term goals..  Continue plan of care.  PT Short Term Goals Week 1:  PT Short Term Goal 1 (Week 1): Pt will complete sit to stand transfer with CGA consistently. PT Short Term Goal 1 - Progress (Week 1): Progressing toward goal PT Short Term Goal 2 (Week 1): Pt will complete bed to chair transfer with minA consistently. PT Short Term Goal 2 - Progress (Week 1): Met PT Short Term Goal 3 (Week 1): Pt will ambulate x50' with minA and LRAD. PT Short Term Goal 4 (Week 1): Pt will complete x2 steps with LHR and minA. PT Short Term Goal 4 - Progress (Week 1): Met Week 2:  PT Short Term Goal 1 (Week 2): STGs = LTGs  Skilled Therapeutic Interventions/Progress Updates:  Ambulation/gait training;Community reintegration;DME/adaptive equipment instruction;Neuromuscular re-education;Stair training;Psychosocial  support;UE/LE Strength taining/ROM;Wheelchair propulsion/positioning;Balance/vestibular training;Functional electrical stimulation;Discharge planning;Pain management;Skin care/wound management;Therapeutic Activities;UE/LE Coordination activities;Cognitive remediation/compensation;Disease management/prevention;Functional mobility training;Patient/family education;Splinting/orthotics;Therapeutic Exercise;Visual/perceptual remediation/compensation   Pt received supine in bed and agrees to therapy. No complaint of pain. Supine to sit with cues for body mechanics and positioning at EOB. Pt performs sit to stand and stand pivot to [facility] with CGA and cues for positioning. WC transport to gym for time management. PT ace wraps pt's Rt foot to promote dorsiflexion and eversion. Pt stands and ambulates x80' without AD, requiring modA to maxA with several LOBs, typically associated with Rt sided weight shifting and inadequate base of support through RLE. PT provides cues for weight shifting, posture, and kicking RLE to progresd during swing phase. During rest break, PT provides demonstration of use of hurrycane to promote improved stability. Pt then ambulates multiple bouts with hurrycane, but continues to have difficulty with LOBs to the Rt, requiring consistent modA during ambulation. Pt then performs foot taps with LLE on 4 inch step with PT providing minA/modA at Lt knee to promote stability and facilitation of Lt hip abductor activation. WC transport back to room. Stand pivot to bed with minA. Left supine with all needs within reach.   Therapy Documentation Precautions:  Precautions Precautions: Fall Precaution/Restrictions Comments: R-hemiplegia; Dysarthric Restrictions Weight Bearing Restrictions Per Provider Order: No  Therapy/Group: Individual Therapy  Elsie JAYSON Dawn, PT, DPT 04/05/2024, 4:27 PM

## 2024-04-05 NOTE — Progress Notes (Signed)
 "                                                        PROGRESS NOTE   Subjective/Complaints:  No acute complaints, no concerns, vital stable, labs stable. Patient states pain is well-controlled.  No further cough.  Is starting to get some tightness behind his right knee, and in his right fingertips.   ROS: Denies fevers, chills, N/V, abdominal pain, constipation, diarrhea, SOB, cough, chest pain, new weakness or paraesthesias.   + Right knee and finger tightness  Objective:   No results found.  Recent Labs    04/04/24 0515  WBC 11.2*  HGB 10.8*  HCT 31.1*  PLT 431*   Recent Labs    04/04/24 0515  NA 136  K 4.4  CL 101  CO2 26  GLUCOSE 111*  BUN 9  CREATININE 0.76  CALCIUM  9.1    Intake/Output Summary (Last 24 hours) at 04/05/2024 0914 Last data filed at 04/05/2024 0752 Gross per 24 hour  Intake 1080 ml  Output 1300 ml  Net -220 ml        Physical Exam: Vital Signs Blood pressure 127/71, pulse 79, temperature 97.9 F (36.6 C), resp. rate 19, height 5' 8 (1.727 m), weight 54.5 kg, SpO2 100%.  General: No acute distress.  Sitting up in bed.  Mood and affect are appropriate, though a little flat Heart: Regular rate and rhythm no rubs murmurs or extra sounds Lungs: CTAB no w/r/r, no cough during exam Abdomen: Positive bowel sounds, soft nontender to palpation, nondistended Extremities: No clubbing, cyanosis, or edema Skin: No evidence of breakdown, no evidence of rash + Bruising over right hip, flank-- not reassessed  Neurologic:  Awake, alert, and oriented x 3.  Cognition intact. Cranial nerves II through XII intact motor strength is 5/5 in left  1+/5 RUE, 1+/5 RLE proximal and 0/5 distal   Sensory exam normal sensation to light touch  in bilateral upper and lower extremities No ataxia. Tone: MAS 1+ right knee flexor and MAS 2 right finger flexors, wrist flexor  Musculoskeletal:  No apparent deformity   Assessment/Plan: 1. Functional deficits  which require 3+ hours per day of interdisciplinary therapy in a comprehensive inpatient rehab setting. Physiatrist is providing close team supervision and 24 hour management of active medical problems listed below. Physiatrist and rehab team continue to assess barriers to discharge/monitor patient progress toward functional and medical goals  Care Tool:  Bathing    Body parts bathed by patient: Chest, Right arm, Abdomen, Front perineal area, Right upper leg, Left upper leg, Right lower leg, Left lower leg, Face   Body parts bathed by helper: Left arm, Buttocks     Bathing assist Assist Level: Minimal Assistance - Patient > 75%     Upper Body Dressing/Undressing Upper body dressing   What is the patient wearing?: Pull over shirt    Upper body assist Assist Level: Minimal Assistance - Patient > 75%    Lower Body Dressing/Undressing Lower body dressing      What is the patient wearing?: Pants     Lower body assist Assist for lower body dressing: Minimal Assistance - Patient > 75%     Toileting Toileting    Toileting assist Assist for toileting: Minimal Assistance - Patient > 75%  Transfers Chair/bed transfer  Transfers assist     Chair/bed transfer assist level: Minimal Assistance - Patient > 75%     Locomotion Ambulation   Ambulation assist      Assist level: 2 helpers Assistive device:  (Lt handrail) Max distance: 30'   Walk 10 feet activity   Assist     Assist level: 2 helpers Assistive device:  (Left handrail)   Walk 50 feet activity   Assist Walk 50 feet with 2 turns activity did not occur: Safety/medical concerns         Walk 150 feet activity   Assist Walk 150 feet activity did not occur: Safety/medical concerns         Walk 10 feet on uneven surface  activity   Assist Walk 10 feet on uneven surfaces activity did not occur: Safety/medical concerns         Wheelchair     Assist Is the patient using a  wheelchair?: Yes Type of Wheelchair: Manual    Wheelchair assist level: Dependent - Patient 0% Max wheelchair distance: 150'    Wheelchair 50 feet with 2 turns activity    Assist        Assist Level: Dependent - Patient 0%   Wheelchair 150 feet activity     Assist      Assist Level: Dependent - Patient 0%   Blood pressure 127/71, pulse 79, temperature 97.9 F (36.6 C), resp. rate 19, height 5' 8 (1.727 m), weight 54.5 kg, SpO2 100%.  Medical Problem List and Plan: 1. 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 A little impulsive/quick to move, but overall did well with PT.    - 12/29: orthotics AFO consult--12/31 afternoon   - 12/30: Home with wife and parents to assist while she is at work. CGA SPT Min A UE with some trace movement of distal R. PT SPV bed, CGA transfers, walked 100 ft +1 assist Min A yesterday. Family ed Friday. Tolerating regular diet. 75% intelligable. Bsc and TTB needed for home.   2.  Antithrombotics: -DVT/anticoagulation:  Mechanical: Antiembolism stockings, thigh (TED hose) Bilateral lower extremities -antiplatelet therapy: Aspirin  81 mg daily and Brilinta  90 mg twice daily x 4 weeks then aspirin  alone - 12/26: Right lower extremity duplex due to mild leukocytosis, pain and range of motion deficit. Neg for DVT 12/27  3. Pain Management: Oxycodone  5 mg every 6 hours as needed 12/24: Add Tylenol  650 mg Q6H PRN for mild to moderate pain, baclofen  5 mg TID PRN for spasms.  Lidoderm  patch over right hip for GTB pain. 12/25: Pain continues, not using PRNs, continue current management; improved  4. Mood/Behavior/Sleep: Provide emotional support             -antipsychotic agents: N/A 5. Neuropsych/cognition:  This patient is capable of making decisions on his own behalf. 6. Skin/Wound Care: Routine skin checks  - Monitor diffuse bruising on right flank, stable   - 12/25: DC IV  7. Fluids/Electrolytes/Nutrition: Routine in and outs with follow-up chemistries, continue vitamins/supplements  - Admission labs stable; repeat 12/26--stable  8.  Hyperlipidemia.  Crestor  20mg  daily 9.  History of alcohol as well  as tobacco use.  Continue Nicorette  gum prn.  Latest alcohol level 155 03/17/2024.  Provide counseling  10.  Hypertension.  Home medications amlodipine  5 mg daily, lisinopril  20 mg daily.  Goal BP less than 220/110.  Per discharge, gradually resume antihypertensive over next 2 weeks based on BP. 12/23: Monitor with therapies today, will resume lisinopril  at low-dose 5 mg in a.m.  12/24-30: normotensive - monitor  Vitals:   04/01/24 1508 04/01/24 2021 04/02/24 0602 04/02/24 1516  BP: 110/64 129/76 137/89 114/71   04/02/24 2024 04/03/24 0542 04/03/24 1531 04/03/24 2007  BP: (!) 118/91 126/88 121/83 122/76   04/04/24 0529 04/04/24 1632 04/04/24 2108 04/05/24 0608  BP: (!) 141/80 113/74 108/73 127/71     11.  Anemia.  Hemoglobin 12.1->10.7 on admission.  FOBT, H&H in AM.    - Continue aspirin  and Brilinta  for now 12/24: h/h stable 10.7; FOBT pending 12-26 hemoglobin stable, 11--> 10.7 12/27  12.  Thrombocytosis.  Likely reactive, trend.  - Staying in the 400s; monitor  13. Bowel incontinence/patient. Timed toiletting.   - Last bowel movement 12-23: Start Colace 100 mg twice daily   - 12/26: add sennakot 8.6mg  nightly  -04/03/24 LBM yesterday but not documented. Monitor.   14.  Leukocytosis.  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  16.  Spasticity due  to left-sided hemiparesis  - AFO consult pending as above  - 12-30: Start scheduled baclofen  5 mg 3 times daily.  LOS: 8 days A FACE TO FACE EVALUATION WAS PERFORMED  Joesph JAYSON Likes 04/05/2024, 9:14 AM     "

## 2024-04-05 NOTE — Patient Care Conference (Signed)
 Inpatient RehabilitationTeam Conference and Plan of Care Update Date: 04/05/2024   Time: 10:20 AM    Patient Name: Isaac Meza      Medical Record Number: [id number]  Date of Birth: 09-23-1970 Sex: Male         Room/Bed: 4M03C/4M03C-01 Payor Info: Payor: [place] MEDICAID PREPAID HEALTH PLAN / Plan: [place] MEDICAID HEALTHY BLUE / Product Type: *No Product type* /    Admit Date/Time:  03/28/2024  3:56 PM  Primary Diagnosis:  Infarction of left basal ganglia [facility])  Hospital Problems: Principal Problem:   Infarction of left basal ganglia [facility])    Expected Discharge Date: Expected Discharge Date: 04/12/24  Team Members Present: Physician leading conference: Dr. Joesph Likes Social Worker Present: Graeme Jude, LCSW Nurse Present: Barnie Ronde, RN PT Present: Kirt Dawn, PT OT Present: Nereida Habermann, OT SLP Present: Recardo Mole, SLP     Current Status/Progress Goal Weekly Team Focus  Bowel/Bladder   continent of bowel and bladder   no accidents   attend to patient's bowel and bladder needs    Swallow/Nutrition/ Hydration   regular/thin           ADL's   CGA squat pivot, min A stand pivot. Good awareness of the RUE- minimal activation in the fingers, bicep/tricep and anterior deltoid. (S) UB ADLs, min A LB ADLs. min A toileting   Supervision for ADL and transfers   ADls, transfers, RUE NMR, e-stim, dc planning    Mobility   supervision bed mobility, CGA to minA transfers, minA/modA x100' without AD   Supervision  Rt hemibody NMR, ambulation, balance, family ed, DC prep    Communication   moderate to severe dysarthria: breath support, rate, over articulation; mild specifc word finding deficits   minA   EMST, generalization of speech intelligibility strategies, pt/family education    Safety/Cognition/ Behavioral Observations               Pain   6/10 pain   2/10 pain   pharmacologic and deep breathing exercises    Skin   intact   free from skin  breakdown  turning and positioning      Discharge Planning:  Pt will discharge to home with wife who works and her parents are there during the day to assist while wife is working, will have 24/7 care. PCS referral faxed to LTSS DEpt with insurance to assit with aide support at home. Will confirm there are no barriers to discharge.   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 Patient  on hypertension protocol to gradually resume medications and monitor blood pressure with therapies. Patient is medically stable with occasional coughing noted with thin liquids; encourage use of strategies but limited mostly by loss of balance during ambulation.  Patient on target to meet rehab goals: yes, currently needs CGA - min assist for stand pivot transfers and supervision for upper body care with min assist for lower body care and toileting. Able to ambulate up to 100' without knees buckling  *See Care Plan and progress notes for long and short-term goals.   Revisions to Treatment Plan:  EMST 150 E-stim   Teaching Needs: Safety, medications, transfers, toileting, etc.   Current Barriers to Discharge: Decreased caregiver support and Home enviroment access/layout  Possible Resolutions to Barriers: Family education PSC (aide) referral DME: BSC, TTB OP follow up services OP ENT referral for vocal fold impairment     Medical Summary Current  Status: Medically complicated by CVA, leukocytosis, R hip pain  Barriers to Discharge: Inadequate Nutritional Intake;Incontinence;Medical stability;Self-care education;Uncontrolled Pain   Possible Resolutions to [employer]: Titrate pain medications for functional improvements, monitor WBC improvement and workup as needed   Continued Need for Acute Rehabilitation Level of Care: The patient requires daily medical management by a physician with specialized  training in physical medicine and rehabilitation for the following reasons: Direction of a multidisciplinary physical rehabilitation program to maximize functional independence : Yes Medical management of patient stability for increased activity during participation in an intensive rehabilitation regime.: Yes Analysis of laboratory values and/or radiology reports with any subsequent need for medication adjustment and/or medical intervention. : Yes   I attest that I was present, lead the team conference, and concur with the assessment and plan of the team.   Fredericka Sober B 04/05/2024, 12:57 PM

## 2024-04-06 NOTE — Progress Notes (Signed)
 Speech Language Pathology Daily Session Note  Ppatient Details  Name: Isaac Meza MRN: [id number] Date of Birth: [birth year]  Today's Date: 04/06/2024 SLP Individual Time: 1100-1200 SLP Individual Time Calculation (min): 60 min  Short Term Goals: Week 2: SLP Short Term Goal 1 (Week 2): STGs = LTGs d/t ELOS  Skilled Therapeutic Interventions:   Pt greeted at bedside for tx targeting cognition and communication. Pt was able to recall therapies thus far independently. He also recalled SLOP speech strategies @ modI. SLP then facilitated verbal specific word finding task completing complex phrases. He benefited from modA to utilize over articulation, volume, and slow rate of speech to maintain 90% intelligibility w/ context. He required only supervisionA for specific word finding. He also completed EMST via EMST150. Resistance remained @ 30 cmH2O and he completed 25 reps independently. At the end of tx tasks, he was left in bed w/ the call light within reach. Recommend cont ST per POC.   Pain Pain Assessment Pain Scale: 0-10 Pain Score: 8  Pain Location: Back Pain Intervention(s): Medication (See eMAR)  Therapy/Group: Individual Therapy  [name] 04/06/2024, 1:36 PM

## 2024-04-06 NOTE — Progress Notes (Signed)
 Physical Therapy Session Note  Patient Details  Name: Isaac Meza MRN: [id number] Date of Birth: 1971/02/27  {CHL IP REHAB PT TIME CALCULATION:[id number]}  Short Term Goals: Week 2:  PT Short Term Goal 1 (Week 2): STGs = LTGs  Skilled Therapeutic Interventions/Progress Updates:     Pt received semi reclined in bed and agrees to therapy. No complaint of pain. Supine to sit with cues for sequencing and positioning. Pt performs stand pivot to WC with CGA and cues for body mechanics. WC transport to gym. PT ace weraps Rt ankle for dorsiflexion and eversion. Pt stands and ambulates x75' with hurrycane and minA, with much improved balance and safety relative to previous session with this therapist. PT provides cues for engagement of Rt hip abductors and knee extensors, but no physical assistance required to prevent knee buckling. Following rest break, pt performs side stepping in parallel bars, facing mirror for visual feedback. Pt cued to utilize upright gaze to assist with NM feedback. PT provides cues for posture and body mechanics.   Pt progresses to performs foot taps on 4 inch step with LLE, holding bars with LUE. Performed to promtoe weight shifting to RLE and stance control. Pt completes 2x20 with seated rest break. Mirror provided for visual feedback and PT provides feedback on performance and optimal sequencing and body mechanics.   Therapy Documentation Precautions:  Precautions Precautions: Fall Precaution/Restrictions Comments: R-hemiplegia; Dysarthric Restrictions Weight Bearing Restrictions Per Provider Order: No   Therapy/Group: Individual Therapy  Elsie JAYSON Dawn, PT, DPT 04/06/2024, 10:18 AM

## 2024-04-06 NOTE — Progress Notes (Addendum)
 Patient ID: Isaac Meza, male   DOB: Jul 25, 1970, 53 y.o.   MRN: [id number]  [id number] left message for [name] to provide updates from team conference, inform on change in d/c date from 1/6 to 1/3, with d/c recs- OPT PT/OT/SLP and DME TTB and 3in1 BSC. SW requested return phone call to discuss fam edu as well.   *SW met with pt in room to discuss above. Prefers [facility] for outpatient, and SW will order DME. Wife reported that she was coming in for family edu tomorrow at 10am; also Friday 8am-10am. SW confirmed family edu with therapy.   Graeme Jude, MSW, LCSW Office: [phone] Cell: [phone] Fax: [phone]

## 2024-04-06 NOTE — Plan of Care (Signed)
" °  Problem: Consults Goal: RH STROKE PATIENT EDUCATION Description: See Patient Education module for education specifics  Outcome: Progressing   Problem: RH BOWEL ELIMINATION Goal: RH STG MANAGE BOWEL WITH ASSISTANCE Description: STG Manage Bowel with supervision Assistance. Outcome: Progressing   Problem: RH BLADDER ELIMINATION Goal: RH STG MANAGE BLADDER WITH ASSISTANCE Description: STG Manage Bladder With supervision Assistance Outcome: Progressing   Problem: RH SKIN INTEGRITY Goal: RH STG SKIN FREE OF INFECTION/BREAKDOWN Description: Manage skin free of infection with supervision assistance Outcome: Progressing   Problem: RH SAFETY Goal: RH STG ADHERE TO SAFETY PRECAUTIONS W/ASSISTANCE/DEVICE Description: STG Adhere to Safety Precautions With Assistance/Device. Outcome: Progressing   Problem: RH PAIN MANAGEMENT Goal: RH STG PAIN MANAGED AT OR BELOW PT'S PAIN GOAL Description: <45 w/ prns Outcome: Progressing   Problem: RH KNOWLEDGE DEFICIT Goal: RH STG INCREASE KNOWLEDGE OF HYPERTENSION Description: Manage increase  knowledge of hypertension from wife and family with supervision assistance using educational materials provided Outcome: Progressing Goal: RH STG INCREASE KNOWLEGDE OF HYPERLIPIDEMIA Description: Manage increase  knowledge of hyperlipidemia from wife and family with supervision assistance using educational materials provided Outcome: Progressing Goal: RH STG INCREASE KNOWLEDGE OF STROKE PROPHYLAXIS Description: Manage increase  knowledge of stroke prophylaxis from wife and family with supervision assistance using educational materials provided Outcome: Progressing   "

## 2024-04-06 NOTE — Progress Notes (Signed)
 "                                                        PROGRESS NOTE   Subjective/Complaints:  No events overnight. NO acute complaints Vitals stable      04/06/2024    4:33 AM 04/05/2024    8:27 PM 04/05/2024    3:15 PM  Vitals with BMI  Systolic 121 116 894  Diastolic 75 80 71  Pulse 79 63 67    No results for input(s): GLUCAP in the last 72 hours.   P.o. intakes appropriate    ROS: Denies fevers, chills, N/V, abdominal pain, constipation, diarrhea, SOB, cough, chest pain, new weakness or paraesthesias.   + Right knee and finger tightness  Objective:   No results found.  Recent Labs    04/04/24 0515  WBC 11.2*  HGB 10.8*  HCT 31.1*  PLT 431*   Recent Labs    04/04/24 0515  NA 136  K 4.4  CL 101  CO2 26  GLUCOSE 111*  BUN 9  CREATININE 0.76  CALCIUM  9.1    Intake/Output Summary (Last 24 hours) at 04/06/2024 0745 Last data filed at 04/06/2024 0700 Gross per 24 hour  Intake 1677 ml  Output 2400 ml  Net -723 ml        Physical Exam: Vital Signs Blood pressure 121/75, pulse 79, temperature 97.9 F (36.6 C), resp. rate 18, height 5' 8 (1.727 m), weight 54.5 kg, SpO2 98%.  General: No acute distress.  Sitting up in bed.  Mood and affect are appropriate, though a little flat Heart: Regular rate and rhythm no rubs murmurs or extra sounds Lungs: CTAB no w/r/r, no cough during exam Abdomen: Positive bowel sounds, soft nontender to palpation, nondistended Extremities: No clubbing, cyanosis, or edema Skin: No evidence of breakdown, no evidence of rash + Bruising over right hip, flank-- not reassessed  Neurologic:  Awake, alert, and oriented x 3.  Cognition intact. Cranial nerves II through XII intact motor strength is 5/5 in left  1+/5 RUE, 1+/5 RLE proximal and 0/5 distal   Sensory exam normal sensation to light touch  in bilateral upper and lower extremities No ataxia. Tone: MAS 1+ right knee flexor and MAS 2 right finger flexors, wrist  flexor  Musculoskeletal:  No apparent deformity  Physical exam unchanged from the above on reexamination 04/06/2024   Assessment/Plan: 1. Functional deficits which require 3+ hours per day of interdisciplinary therapy in a comprehensive inpatient rehab setting. Physiatrist is providing close team supervision and 24 hour management of active medical problems listed below. Physiatrist and rehab team continue to assess barriers to discharge/monitor patient progress toward functional and medical goals  Care Tool:  Bathing    Body parts bathed by patient: Chest, Right arm, Abdomen, Front perineal area, Right upper leg, Left upper leg, Right lower leg, Left lower leg, Face, Buttocks   Body parts bathed by helper: Left arm     Bathing assist Assist Level: Minimal Assistance - Patient > 75%     Upper Body Dressing/Undressing Upper body dressing   What is the patient wearing?: Pull over shirt    Upper body assist Assist Level: Minimal Assistance - Patient > 75%    Lower Body Dressing/Undressing Lower body dressing      What is the  patient wearing?: Pants, Underwear/pull up     Lower body assist Assist for lower body dressing: Minimal Assistance - Patient > 75%     Toileting Toileting    Toileting assist Assist for toileting: Minimal Assistance - Patient > 75%     Transfers Chair/bed transfer  Transfers assist     Chair/bed transfer assist level: Minimal Assistance - Patient > 75%     Locomotion Ambulation   Ambulation assist      Assist level: 2 helpers Assistive device:  (Lt handrail) Max distance: 30'   Walk 10 feet activity   Assist     Assist level: 2 helpers Assistive device:  (Left handrail)   Walk 50 feet activity   Assist Walk 50 feet with 2 turns activity did not occur: Safety/medical concerns         Walk 150 feet activity   Assist Walk 150 feet activity did not occur: Safety/medical concerns         Walk 10 feet on uneven  surface  activity   Assist Walk 10 feet on uneven surfaces activity did not occur: Safety/medical concerns         Wheelchair     Assist Is the patient using a wheelchair?: Yes Type of Wheelchair: Manual    Wheelchair assist level: Dependent - Patient 0% Max wheelchair distance: 150'    Wheelchair 50 feet with 2 turns activity    Assist        Assist Level: Dependent - Patient 0%   Wheelchair 150 feet activity     Assist      Assist Level: Dependent - Patient 0%   Blood pressure 121/75, pulse 79, temperature 97.9 F (36.6 C), resp. rate 18, height 5' 8 (1.727 m), weight 54.5 kg, SpO2 98%.  Medical Problem List and Plan: 1. 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 A little impulsive/quick to move, but overall did well with PT.    - 12/29: orthotics AFO consult--12/31 afternoon   - 12/30: Home with wife and parents to assist while she is at work. CGA SPT Min A UE with some trace movement of distal R. PT SPV bed, CGA transfers, walked 100 ft +1 assist Min A yesterday. Family ed Friday. Tolerating regular diet. 75% intelligable. Bsc and TTB needed for home.   2.  Antithrombotics: -DVT/anticoagulation:  Mechanical: Antiembolism stockings, thigh (TED hose) Bilateral lower extremities -antiplatelet therapy: Aspirin  81 mg daily and Brilinta  90 mg twice daily x 4 weeks then aspirin  alone - 12/26: Right lower extremity duplex due to mild leukocytosis, pain and range of motion deficit. Neg for DVT 12/27  3. Pain Management: Oxycodone  5 mg every 6 hours as needed 12/24: Add Tylenol  650 mg Q6H PRN for mild to moderate pain, baclofen  5 mg TID PRN for spasms.  Lidoderm  patch over right hip for GTB pain. 12/25: Pain  continues, not using PRNs, continue current management; improved  4. Mood/Behavior/Sleep: Provide emotional support             -antipsychotic agents: N/A 5. Neuropsych/cognition: This patient is capable of making decisions on his own behalf. 6. Skin/Wound Care: Routine skin checks  -  Monitor diffuse bruising on right flank, stable   - 12/25: DC IV  7. Fluids/Electrolytes/Nutrition: Routine in and outs with follow-up chemistries, continue vitamins/supplements  - Admission labs stable; repeat 12/26--stable  8.  Hyperlipidemia.  Crestor  20mg  daily 9.  History of alcohol as well as tobacco use.  Continue Nicorette  gum prn.  Latest alcohol level 155 03/17/2024.  Provide counseling  10.  Hypertension.  Home medications amlodipine  5 mg daily, lisinopril  20 mg daily.  Goal BP less than 220/110.  Per discharge, gradually resume antihypertensive over next 2 weeks based on BP. 12/23: Monitor with therapies today, will resume lisinopril  at low-dose 5 mg in a.m.  - normotensive - monitor  Vitals:   04/02/24 1516 04/02/24 2024 04/03/24 0542 04/03/24 1531  BP: 114/71 (!) 118/91 126/88 121/83   04/03/24 2007 04/04/24 0529 04/04/24 1632 04/04/24 2108  BP: 122/76 (!) 141/80 113/74 108/73   04/05/24 0608 04/05/24 1515 04/05/24 2027 04/06/24 0433  BP: 127/71 105/71 116/80 121/75     11.  Anemia.  Hemoglobin 12.1->10.7 on admission.  FOBT, H&H in AM.    - Continue aspirin  and Brilinta  for now 12/24: h/h stable 10.7; FOBT pending 12-26 hemoglobin stable, 11--> 10.7 12/27  12.  Thrombocytosis.  Likely reactive, trend.  - Staying in the 400s; monitor  13. Bowel incontinence/patient. Timed toiletting.   - Last bowel movement 12-23: Start Colace 100 mg twice daily   - 12/26: add sennakot 8.6mg  nightly  -04/03/24 LBM yesterday but not documented. Monitor.   14.  Leukocytosis.  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  16.  Spasticity due to left-sided hemiparesis  - AFO consult pending as above  - 12-30: Start scheduled baclofen  5 mg 3 times daily--tolerating 12/31  LOS: 9 days A FACE TO FACE EVALUATION WAS PERFORMED  Joesph JAYSON Likes 04/06/2024, 7:45 AM     "

## 2024-04-06 NOTE — Progress Notes (Signed)
 Physical Therapy Session Note  Patient Details  Name: Isaac Meza MRN: [id number] Date of Birth: [birth year]  Today's Date: 04/06/2024 PT Individual Time: 8695-8654 PT Individual Time Calculation (min): 41 min   Short Term Goals: Week 2:  PT Short Term Goal 1 (Week 2): STGs = LTGs  Skilled Therapeutic Interventions/Progress Updates:    Pt presents in room in bed, agreeable to PT. Pt does not report pain during session. Session focused on therapeutic activities with orthotist consultation for R AFO as well as activity tolerance. Pt completes bed mobility with supervision, squat pivot with supervision to WC on L. Pt transported to main gym and completes 3x30' gait trials with min assist and hurrycane, first trial without AFO, second trial with toe off AFO with medial strut, and 3rd trial with more rigid toe off with posterior strut. Pt demonstrating vaulting with LLE most significantly with first trial for clearing and advancing RLE, pt demonstrating R foot inversion and R hip ER most significantly with fatigue. Orthotist and therapist educating on toe cap for shoe however decided to wait until pt receives new shoes and will decide which shoes to use on Friday, 1/2. Notable improvement in gait deviations with final trial with rigid toe off posterior strut, demo left in room for pt to trial until personal AFO delivered. Pt endorsing fatigue following gait trials, agreeable to continuous training on nustep to promote improved activity tolerance x10 minutes on L3 resistance, with BUE/BLE (RUE fixed to handle via ace wrap), cues provided to maintain neutral hip RLE. Pt returns to room and completes squat pivot supervision to R back to bed,  remains semi reclined in bed with all needs within reach, cal light in place and bed alarm activated at end of session.   Therapy Documentation Precautions:  Precautions Precautions: Fall Precaution/Restrictions Comments: R-hemiplegia;  Dysarthric Restrictions Weight Bearing Restrictions Per Provider Order: No   Therapy/Group: Individual Therapy  Reche Ohara PT, DPT 04/06/2024, 4:01 PM

## 2024-04-06 NOTE — Progress Notes (Signed)
 Occupational Therapy Session Note  Patient Details  Name: Isaac Meza MRN: [id number] Date of Birth: 1970/07/11  Today's Date: 04/06/2024 OT Individual Time: 9169-9069 OT Individual Time Calculation (min): 60 min    Short Term Goals: Week 2:  OT Short Term Goal 1 (Week 2): STG= LTG d/t ELOS  Skilled Therapeutic Interventions/Progress Updates:    Pt received supine with no c/o pain, agreeable to OT session.  He was able to don shorts at bed level with (S). He came to EOB with (S), heavy reliance on LUE on bed rails and pt reporting discomfort, encouraged him to not push/pull so much on the LUE to protect GH joint. Squat pivot to the w/c with (S) ! Pt was taken via w/c to the therapy gym for time management. He first worked on two bouts of 90 ft of functional mobiltiy with a hurrycane. Dorsiflexion wrap applied to RLE. He required CGA for the first 30 ft and then min A for the final. Turning to sit in the w/c, toward the R side he required mod A to not fall d/t RLE getting caught between foot and wheel. Provided demo and edu on ensuring proper placement of the RLE. Placed the Saebo on his anterior/middle deltoid to approximate shoulder joint throughout session to protect GH joint during unsupported mobility. He then worked on RLE stability training in standing with LLE tapping onto a step to promote full weightbearing through extended RLE. OT provided min facilitation for blocking but he demonstrated much improved stability and quad activation. 3x15 repetitions with rest break between. Finally instructed pt in self PROM. He returned to his room and transferred to supine. He was left supine with all needs met, bed alarm set.    Saebo Stim One was placed on his R dorsal wrist to increase finger and wrist extension activation and stimulation to the limb for increased NMR. 60 min unattended e stim tolerated. No adverse pain or skin reaction.  330 pulse width 35 Hz pulse rate On 8 sec/ off 8  sec Ramp up/ down 2 sec Symmetrical Biphasic wave form  Max intensity at 500 Ohm load  Therapy Documentation Precautions:  Precautions Precautions: Fall Precaution/Restrictions Comments: R-hemiplegia; Dysarthric Restrictions Weight Bearing Restrictions Per Provider Order: No  Therapy/Group: Individual Therapy  [name] 04/06/2024, 9:01 AM

## 2024-04-07 LAB — BASIC METABOLIC PANEL WITH GFR
Anion gap: 9 (ref 5–15)
BUN: 9 mg/dL (ref 6–20)
CO2: 27 mmol/L (ref 22–32)
Calcium: 9 mg/dL (ref 8.9–10.3)
Chloride: 102 mmol/L (ref 98–111)
Creatinine, Ser: 0.75 mg/dL (ref 0.61–1.24)
GFR, Estimated: >60 mL/min
Glucose, Bld: 111 mg/dL — ABNORMAL HIGH (ref 70–99)
Potassium: 4.5 mmol/L (ref 3.5–5.1)
Sodium: 138 mmol/L (ref 135–145)

## 2024-04-07 LAB — CBC WITH DIFFERENTIAL/PLATELET
Abs Immature Granulocytes: 0.05 K/uL (ref 0.00–0.07)
Basophils Absolute: 0.1 K/uL (ref 0.0–0.1)
Basophils Relative: 0 %
Eosinophils Absolute: 0.2 K/uL (ref 0.0–0.5)
Eosinophils Relative: 2 %
HCT: 32 % — ABNORMAL LOW (ref 39.0–52.0)
Hemoglobin: 11 g/dL — ABNORMAL LOW (ref 13.0–17.0)
Immature Granulocytes: 0 %
Lymphocytes Relative: 12 %
Lymphs Abs: 1.5 K/uL (ref 0.7–4.0)
MCH: 32.3 pg (ref 26.0–34.0)
MCHC: 34.4 g/dL (ref 30.0–36.0)
MCV: 93.8 fL (ref 80.0–100.0)
Monocytes Absolute: 1 K/uL (ref 0.1–1.0)
Monocytes Relative: 8 %
Neutro Abs: 9.3 K/uL — ABNORMAL HIGH (ref 1.7–7.7)
Neutrophils Relative %: 78 %
Platelets: 411 K/uL — ABNORMAL HIGH (ref 150–400)
RBC: 3.41 MIL/uL — ABNORMAL LOW (ref 4.22–5.81)
RDW: 12.1 % (ref 11.5–15.5)
WBC: 12 K/uL — ABNORMAL HIGH (ref 4.0–10.5)
nRBC: 0 % (ref 0.0–0.2)

## 2024-04-07 MED ORDER — BACLOFEN 10 MG PO TABS
10.0000 mg | ORAL_TABLET | Freq: Three times a day (TID) | ORAL | Status: DC
  Administered 2024-04-07 – 2024-04-12 (×15): 10 mg via ORAL
  Filled 2024-04-07 (×15): qty 1

## 2024-04-07 NOTE — Progress Notes (Signed)
 Physical Therapy Session Note  Patient Details  Name: Isaac Meza MRN: [id number] Date of Birth: Mar 09, 1971  Today's Date: 04/07/2024 PT Individual Time: 8697-8585 PT Individual Time Calculation (min): 72 min   Short Term Goals: Week 2:  PT Short Term Goal 1 (Week 2): STGs = LTGs  Skilled Therapeutic Interventions/Progress Updates:     Pt received semi reclined in bed and agrees to therapy. NO complaint of pain. Wife present for family education. Bed mobility with bed features and increased time. Pt performs stand pivot to [facility] with CGA and cues for safety and positioning. WC transport to gym. PT provides update on pt's mobility progress with therapy, as well as recommendations for safe mobility following discharge. Pt performs car transfer with minA initially and then with CGA and cues for safe sequencing. Following rest break, pt completes x8 6 steps with minA/modA and cues for safe step sequencing. Following rest break, pt completes x4 additional steps with wife providing assistance. PT cues wife for safe guarding technique and cues to provide to pt for safety. Seated rest break. Pt then ambulates multiple bouts with hurrycane and shoe cover over Rt toes to limit friction for improved ability to initiate swing phase of gait cycle. Pt ambulates 2x50' with seated rest break, with minA. PT provides cues for posture, reciprocal gait pattern, and lateral weight shifting. WC transport to room. Pt performs stand pivot to toilet with minA and use of grab bars. Pt requires some assistance for pericare, then performs stand pivot to [facility] with cues for weight shifting and increased eccentric control of stand to sit. Pt left seated on edge of bed with all needs within reach.   Therapy Documentation Precautions:  Precautions Precautions: Fall Precaution/Restrictions Comments: R-hemiplegia; Dysarthric Restrictions Weight Bearing Restrictions Per Provider Order: No   Therapy/Group: Individual  Therapy  Elsie JAYSON Dawn, PT, DPT 04/07/2024, 4:18 PM

## 2024-04-07 NOTE — Progress Notes (Signed)
 Occupational Therapy Session Note  Patient Details  Name: Isaac Meza MRN: [id number] Date of Birth: [birth year]  Session 1 Today's Date: 04/07/2024 OT Individual Time: 0800-0900 OT Individual Time Calculation (min): 60 min    Session 2 Today's Date: 04/07/2024 OT Individual Time: 9069-8945 OT Individual Time Calculation (min): 84 min    Short Term Goals: Week 2:  OT Short Term Goal 1 (Week 2): STG= LTG d/t ELOS  Skilled Therapeutic Interventions/Progress Updates:    Session 1 Pt received supine with no c/o pain, agreeable to OT session. OT assisted in donning AFO on RLE- total A and difficult to get into shoe. Saebo placed on his R shoulder for GH joint approximation during mobility. He completed 100 ft of functional mobility to the therapy gym with a hurry cane, requiring min-mod A throughout with cueing required for reducing hip compensation and for widening base of support. He required extra time d/t multiple standing rest breaks and increased effort/time. With pt's permission, took several pictures of the Saebo in various positions on his arm for home use (pt reports he will buy one himself) and then created a document with instructions re use and placement. He completed RUE NMR training using the [facility] Arm support. He worked on flexion/ext in level 4 offloading- achieving 50 degrees flexion and 80-0 degrees extension. Elbow flexion and ext with about 25 degrees of AAROM. Elbow flexor tone up to a 2/5 today. Horizontal abduction with 50 degrees of motion. He returned to his room following. He was left supine with all needs met.    Saebo Stim One parameters 330 pulse width 35 Hz pulse rate On 8 sec/ off 8 sec Ramp up/ down 2 sec Symmetrical Biphasic wave form  Max intensity at 500 Ohm load   Session 2 Pt received supine with no c/o pain, agreeable to OT session.  He came to EOB without bedrail use to simulate home environment, (S). He completed functional mobility  to the bathroom with min A, mod A during turn to toilet, using the hurry cane. He completed 3/3 toileting tasks with CGA. Transfer into shower following with min A. He was able to doff all clothing including AFO with (S). He completed UB bathing with min cueing for compensatory strategies to wash under LUE, which he was able to carryover well. He washed LB with lateral leans with (S). He transferred out to the w/c following with CGA. Oral care at the sink with mod I seated. His wife Isaac Meza arrived for [facility] education session. Verbal education provided re fall risk reduction, energy conservation strategies, home carryover of transfer training, ADLs, and IADLs. Demonstration and hands on training completed for pt performance of UB/LB bathing and dressing at CGA- (S) level, toileting hygiene and transfers, and shower transfers. Provided education and demonstration on DME use recommendations at home. His wife performed teachback of TTB transfer and w/c to bed transfer. Also demonstrated AAROM/PROM of the RUE. He was left supine with all needs met at close of session.     Saebo Stim One was placed on his R dorsal wrist to increase finger and wrist extension activation and stimulation to the limb for increased NMR. 60 min unattended e stim tolerated. No adverse pain or skin reaction.  330 pulse width 35 Hz pulse rate On 8 sec/ off 8 sec Ramp up/ down 2 sec Symmetrical Biphasic wave form  Max intensity at 500 Ohm load  Therapy Documentation Precautions:  Precautions Precautions: Fall Precaution/Restrictions Comments: R-hemiplegia; Dysarthric  Restrictions Weight Bearing Restrictions Per Provider Order: No Therapy/Group: Individual Therapy  [name] 04/07/2024, 7:53 AM

## 2024-04-07 NOTE — Progress Notes (Signed)
 "                                                        PROGRESS NOTE   Subjective/Complaints:  No events overnight. No acute complaints.  Per OT, right upper extremity spasticity increasing, patient denies any pain secondary to this. Vitals stable      04/07/2024    5:21 AM 04/06/2024    8:14 PM 04/06/2024    2:04 PM  Vitals with BMI  Systolic 118 110 882  Diastolic 68 74 79  Pulse 75 76 70   Labs significant for stable leukocytosis 12.0, otherwise unchanged.  ROS: Denies fevers, chills, N/V, abdominal pain, constipation, diarrhea, SOB, cough, chest pain, new weakness or paraesthesias.    + Right sided spasticity  Objective:   No results found.  Recent Labs    04/07/24 0609  WBC 12.0*  HGB 11.0*  HCT 32.0*  PLT 411*   Recent Labs    04/07/24 0609  NA 138  K 4.5  CL 102  CO2 27  GLUCOSE 111*  BUN 9  CREATININE 0.75  CALCIUM  9.0    Intake/Output Summary (Last 24 hours) at 04/07/2024 0841 Last data filed at 04/07/2024 0520 Gross per 24 hour  Intake 370 ml  Output 1875 ml  Net -1505 ml     Physical Exam: Vital Signs Blood pressure 118/68, pulse 75, temperature 97.9 F (36.6 C), temperature source Oral, resp. rate 18, height 5' 8 (1.727 m), weight 54.5 kg, SpO2 100%.  General: No acute distress.  Sitting up in bed.  Mood and affect are appropriate, though a little flat Heart: Regular rate and rhythm no rubs murmurs or extra sounds Lungs: CTAB no w/r/r, no cough during exam Abdomen: Positive bowel sounds, soft nontender to palpation, nondistended Extremities: No clubbing, cyanosis, or edema Skin: No evidence of breakdown, no evidence of rash + Bruising over right hip, flank-resolving  Neurologic:  Awake, alert, and oriented x 3.  Cognition intact. Cranial nerves II through XII intact motor strength is 5/5 in left  1+/5 RUE, 1+/5 RLE proximal and 0/5 distal   Sensory exam normal sensation to light touch  in bilateral upper and lower extremities No  ataxia.  Tone: MAS 2 right knee flexor and MAS 1+ right finger flexors, wrist flexor, MAS 2 R elbow  Musculoskeletal:  No apparent deformity   Assessment/Plan: 1. Functional deficits which require 3+ hours per day of interdisciplinary therapy in a comprehensive inpatient rehab setting. Physiatrist is providing close team supervision and 24 hour management of active medical problems listed below. Physiatrist and rehab team continue to assess barriers to discharge/monitor patient progress toward functional and medical goals  Care Tool:  Bathing    Body parts bathed by patient: Chest, Right arm, Abdomen, Front perineal area, Right upper leg, Left upper leg, Right lower leg, Left lower leg, Face, Buttocks   Body parts bathed by helper: Left arm     Bathing assist Assist Level: Minimal Assistance - Patient > 75%     Upper Body Dressing/Undressing Upper body dressing   What is the patient wearing?: Pull over shirt    Upper body assist Assist Level: Minimal Assistance - Patient > 75%    Lower Body Dressing/Undressing Lower body dressing      What is the patient wearing?: Pants,  Underwear/pull up     Lower body assist Assist for lower body dressing: Minimal Assistance - Patient > 75%     Toileting Toileting    Toileting assist Assist for toileting: Minimal Assistance - Patient > 75%     Transfers Chair/bed transfer  Transfers assist     Chair/bed transfer assist level: Minimal Assistance - Patient > 75%     Locomotion Ambulation   Ambulation assist      Assist level: 2 helpers Assistive device:  (Lt handrail) Max distance: 30'   Walk 10 feet activity   Assist     Assist level: 2 helpers Assistive device:  (Left handrail)   Walk 50 feet activity   Assist Walk 50 feet with 2 turns activity did not occur: Safety/medical concerns         Walk 150 feet activity   Assist Walk 150 feet activity did not occur: Safety/medical concerns          Walk 10 feet on uneven surface  activity   Assist Walk 10 feet on uneven surfaces activity did not occur: Safety/medical concerns         Wheelchair     Assist Is the patient using a wheelchair?: Yes Type of Wheelchair: Manual    Wheelchair assist level: Dependent - Patient 0% Max wheelchair distance: 150'    Wheelchair 50 feet with 2 turns activity    Assist        Assist Level: Dependent - Patient 0%   Wheelchair 150 feet activity     Assist      Assist Level: Dependent - Patient 0%   Blood pressure 118/68, pulse 75, temperature 97.9 F (36.6 C), temperature source Oral, resp. rate 18, height 5' 8 (1.727 m), weight 54.5 kg, SpO2 100%.  Medical Problem List and Plan: 1. 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 A little impulsive/quick to move, but overall did well with PT.    - 12/29: orthotics AFO consult--12/31 afternoon   - 12/30: Home with wife and parents to assist while she is at work. CGA SPT Min A UE with some trace movement of distal R. PT SPV bed, CGA transfers, walked 100 ft +1 assist Min A yesterday. Family ed Friday. Tolerating regular diet. 75% intelligable. Bsc and TTB needed for home.   2.  Antithrombotics: -DVT/anticoagulation:  Mechanical: Antiembolism stockings, thigh (TED hose) Bilateral lower extremities -antiplatelet therapy: Aspirin  81 mg daily and Brilinta  90 mg twice daily x 4 weeks then aspirin  alone - 12/26: Right lower extremity duplex due to mild leukocytosis, pain and range of motion deficit. Neg for DVT 12/27  3. Pain Management: Oxycodone  5 mg every 6 hours as needed 12/24: Add Tylenol  650 mg Q6H PRN for mild to moderate pain, baclofen  5 mg TID PRN for spasms.  Lidoderm   patch over right hip for GTB pain. 12/25: Pain continues, not using PRNs, continue current management; improved  4. Mood/Behavior/Sleep: Provide emotional support             -antipsychotic agents: N/A 5. Neuropsych/cognition: This patient is capable of making decisions on his own behalf. 6. Skin/Wound Care: Routine skin checks  -  Monitor diffuse bruising on right flank, stable   - 12/25: DC IV  7. Fluids/Electrolytes/Nutrition: Routine in and outs with follow-up chemistries, continue vitamins/supplements  - Admission labs stable; repeat 12/26--stable  8.  Hyperlipidemia.  Crestor  20mg  daily 9.  History of alcohol as well as tobacco use.  Continue Nicorette  gum prn.  Latest alcohol level 155 03/17/2024.  Provide counseling  10.  Hypertension.  Home medications amlodipine  5 mg daily, lisinopril  20 mg daily.  Goal BP less than 220/110.  Per discharge, gradually resume antihypertensive over next 2 weeks based on BP. 12/23: Monitor with therapies today, will resume lisinopril  at low-dose 5 mg in a.m.  - normotensive - monitor  Vitals:   04/03/24 1531 04/03/24 2007 04/04/24 0529 04/04/24 1632  BP: 121/83 122/76 (!) 141/80 113/74   04/04/24 2108 04/05/24 0608 04/05/24 1515 04/05/24 2027  BP: 108/73 127/71 105/71 116/80   04/06/24 0433 04/06/24 1404 04/06/24 2014 04/07/24 0521  BP: 121/75 117/79 110/74 118/68     11.  Anemia.  Hemoglobin 12.1->10.7 on admission.  FOBT, H&H in AM.    - Continue aspirin  and Brilinta  for now 12/24: h/h stable 10.7; FOBT pending 12-26 hemoglobin stable, 11--> 10.7 12/27  12.  Thrombocytosis.  Likely reactive, trend.  - Staying in the 400s; monitor  13. Bowel incontinence/patient. Timed toiletting.   - Last bowel movement 12-23: Start Colace 100 mg twice daily   - 12/26: add sennakot 8.6mg  nightly  -04/03/24 LBM yesterday but not documented. Monitor.  - Patient endorsing regular bowel movements  14.  Leukocytosis.  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  16.  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  Increase baclofen  to 10 mg 3 times daily.  Discussed possible Botox end of this week, patient is agreeable.  LOS: 10 days A FACE TO FACE EVALUATION WAS PERFORMED  Isaac Meza Meza 04/07/2024, 8:41 AM     "

## 2024-04-08 NOTE — Progress Notes (Signed)
 Occupational Therapy Session Note  Patient Details  Name: Isaac Meza MRN: [id number] Date of Birth: 01-21-1971  Today's Date: 04/08/2024 OT Individual Time: 9151-9068 OT Individual Time Calculation (min): 43 min    Short Term Goals: Week 2:  OT Short Term Goal 1 (Week 2): STG= LTG d/t ELOS  Skilled Therapeutic Interventions/Progress Updates:    Pt received sitting up with no c/o pain, agreeable to OT session.  Wife present. Reviewed d/c, no further questions. Pt was taken via w/c to the therapy gym for time management. His RUE was placed in the [facility] Arm support. He completed OT and self facilitated AAROM in all planes of movement to promote maximal NMR. He had reduced activation from yesterday overall but did demonstrate about 10 degrees of shoulder flexion, ext 70-0 degrees, elbow flexion 20 degrees, elbow extension 10 degrees, pec major 15 degrees, and posterior deltoid 30 degrees. Provided encouragement that fluctuations in ability are normal with fatigue and other factors. Completed multiple repetitions in each plane. Used the saebo e-stim in conjunction with mobile arm support. He was then engaged in mirror therapy for RUE NMR. Placed his RUE in a mirror box and instructed him in completing multiple AROM exercises/card sorting with the LUE while keeping vision fixed on the reflection. He was able to complete with min cueing for maintaining visual attention to the mirror image. He returned to his room and the mirror was left with him for continued practice. Stand pivot back to bed with CGA. Pt was left supine with all needs met, bed alarm set and call bell within reach.    Saebo Stim One was placed on his R dorsal wrist to increase finger and wrist extension activation and stimulation to the limb for increased NMR. 60 min unattended e stim tolerated. No adverse pain or skin reaction.  330 pulse width 35 Hz pulse rate On 8 sec/ off 8 sec Ramp up/ down 2 sec Symmetrical Biphasic  wave form  Max intensity at 500 Ohm load  Therapy Documentation Precautions:  Precautions Precautions: Fall Precaution/Restrictions Comments: R-hemiplegia; Dysarthric Restrictions Weight Bearing Restrictions Per Provider Order: No   Therapy/Group: Individual Therapy  [name] 04/08/2024, 10:01 AM

## 2024-04-08 NOTE — Progress Notes (Addendum)
 Speech Language Pathology Daily Session Note  Patient Details  Name: Isaac Meza MRN: [id number] Date of Birth: [birth year]  Today's Date: 04/08/2024 SLP Individual Time: 1100-1200 SLP Individual Time Calculation (min): 60 min  Short Term Goals: Week 2: SLP Short Term Goal 1 (Week 2): STGs = LTGs d/t ELOS  Skilled Therapeutic Interventions:   Pt and wife greeted at bedside for tx targeting communication and cognition as well as family education. The pt demonstrated EMST protocol for his wife, independently completing 25 reps @ 60 cmH2O. SLP provided education re speech subsystems, highlighting respiration and phonation. Also provided education re recommendation for ENT consult upon d/c given continued dysphonia and hx of dysphonia. He then completed a structured sentence level task correcting errors. He was independent for specific word finding/grammatical corrections. However, required minA to maintain 75% intelligibility throughout task. Of note, pt can verbalize compensatory speech strategies to utilize, but it remains difficult for him to consistently implement them. His wife reported that she had difficulty understanding [his] speech before this d/t reduced vocal intensity and rapid rate of speech. Will continue to target generalization of compensatory speech strategies. SLP also provided education re mild complex problem solving deficits remaining and return to IADLs. Additional questions were answered, At the end of tx tasks, he was left in bed w/ the call light within reach. Recommend cont ST per POC.   Pain 8/10 R leg and side pain. Nursing notified and provided pain meds. See EMR for more info.   Therapy/Group: Individual Therapy  [name] 04/08/2024, 10:13 AM

## 2024-04-08 NOTE — Progress Notes (Signed)
 Call placed to [facility] informing them of new order for Vendor brace. Writer informed brace has to be ordered and will arrive sometime today.

## 2024-04-08 NOTE — Plan of Care (Signed)
" °  Problem: Consults Goal: RH STROKE PATIENT EDUCATION Description: See Patient Education module for education specifics  Outcome: Progressing   Problem: RH BOWEL ELIMINATION Goal: RH STG MANAGE BOWEL WITH ASSISTANCE Description: STG Manage Bowel with supervision Assistance. Outcome: Progressing   Problem: RH BLADDER ELIMINATION Goal: RH STG MANAGE BLADDER WITH ASSISTANCE Description: STG Manage Bladder With supervision Assistance Outcome: Progressing   Problem: RH SKIN INTEGRITY Goal: RH STG SKIN FREE OF INFECTION/BREAKDOWN Description: Manage skin free of infection with supervision assistance Outcome: Progressing   Problem: RH SAFETY Goal: RH STG ADHERE TO SAFETY PRECAUTIONS W/ASSISTANCE/DEVICE Description: STG Adhere to Safety Precautions With Assistance/Device. Outcome: Progressing   Problem: RH PAIN MANAGEMENT Goal: RH STG PAIN MANAGED AT OR BELOW PT'S PAIN GOAL Description: <45 w/ prns Outcome: Progressing   Problem: RH KNOWLEDGE DEFICIT Goal: RH STG INCREASE KNOWLEDGE OF HYPERTENSION Description: Manage increase  knowledge of hypertension from wife and family with supervision assistance using educational materials provided Outcome: Progressing Goal: RH STG INCREASE KNOWLEGDE OF HYPERLIPIDEMIA Description: Manage increase  knowledge of hyperlipidemia from wife and family with supervision assistance using educational materials provided Outcome: Progressing Goal: RH STG INCREASE KNOWLEDGE OF STROKE PROPHYLAXIS Description: Manage increase  knowledge of stroke prophylaxis from wife and family with supervision assistance using educational materials provided Outcome: Progressing   "

## 2024-04-08 NOTE — Progress Notes (Signed)
 "                                                        PROGRESS NOTE   Subjective/Complaints:  No events overnight. No acute complaints. Has had some improvement with the baclofen  in tighness; notes no side effects. Eating well, sleeping well, having regular Bms. Wife at bedside, all questions answered.    ROS: Denies fevers, chills, N/V, abdominal pain, constipation, diarrhea, SOB, cough, chest pain, new weakness or paraesthesias.   + Right sided spasticity  Objective:   No results found.  Recent Labs    04/07/24 0609  WBC 12.0*  HGB 11.0*  HCT 32.0*  PLT 411*   Recent Labs    04/07/24 0609  NA 138  K 4.5  CL 102  CO2 27  GLUCOSE 111*  BUN 9  CREATININE 0.75  CALCIUM  9.0    Intake/Output Summary (Last 24 hours) at 04/08/2024 1239 Last data filed at 04/08/2024 0700 Gross per 24 hour  Intake 118 ml  Output 1450 ml  Net -1332 ml     Physical Exam: Vital Signs Blood pressure 112/68, pulse 70, temperature 98.6 F (37 C), resp. rate 18, height 5' 8 (1.727 m), weight 54.5 kg, SpO2 100%.  General: No acute distress.  Sitting up in bed.  Mood and affect are appropriate, though a little flat Heart: Regular rate and rhythm no rubs murmurs or extra sounds Lungs: CTAB no w/r/r, no cough during exam Abdomen: Positive bowel sounds, soft nontender to palpation, nondistended Extremities: No clubbing, cyanosis, or edema Skin: No evidence of breakdown, no evidence of rash + Bruising over right hip, flank-resolving  Neurologic:  Awake, alert, and oriented x 3.  Cognition intact. Cranial nerves II through XII intact motor strength is 5/5 in left  1+/5 RUE, 1+/5 RLE proximal and 0/5 distal   Sensory exam normal sensation to light touch  in bilateral upper and lower extremities No ataxia.  Tone: MAS 1+ right knee flexor and MAS 1+ right finger flexors, wrist flexor,  R elbow  Musculoskeletal:  No apparent deformity   Assessment/Plan: 1. Functional deficits which  require 3+ hours per day of interdisciplinary therapy in a comprehensive inpatient rehab setting. Physiatrist is providing close team supervision and 24 hour management of active medical problems listed below. Physiatrist and rehab team continue to assess barriers to discharge/monitor patient progress toward functional and medical goals  Care Tool:  Bathing    Body parts bathed by patient: Chest, Right arm, Abdomen, Front perineal area, Right upper leg, Left upper leg, Right lower leg, Left lower leg, Face, Buttocks   Body parts bathed by helper: Left arm     Bathing assist Assist Level: Minimal Assistance - Patient > 75%     Upper Body Dressing/Undressing Upper body dressing   What is the patient wearing?: Pull over shirt    Upper body assist Assist Level: Minimal Assistance - Patient > 75%    Lower Body Dressing/Undressing Lower body dressing      What is the patient wearing?: Pants, Underwear/pull up     Lower body assist Assist for lower body dressing: Minimal Assistance - Patient > 75%     Toileting Toileting    Toileting assist Assist for toileting: Minimal Assistance - Patient > 75%     Transfers Chair/bed  transfer  Transfers assist     Chair/bed transfer assist level: Minimal Assistance - Patient > 75%     Locomotion Ambulation   Ambulation assist      Assist level: 2 helpers Assistive device:  (Lt handrail) Max distance: 30'   Walk 10 feet activity   Assist     Assist level: 2 helpers Assistive device:  (Left handrail)   Walk 50 feet activity   Assist Walk 50 feet with 2 turns activity did not occur: Safety/medical concerns         Walk 150 feet activity   Assist Walk 150 feet activity did not occur: Safety/medical concerns         Walk 10 feet on uneven surface  activity   Assist Walk 10 feet on uneven surfaces activity did not occur: Safety/medical concerns         Wheelchair     Assist Is the patient  using a wheelchair?: Yes Type of Wheelchair: Manual    Wheelchair assist level: Dependent - Patient 0% Max wheelchair distance: 150'    Wheelchair 50 feet with 2 turns activity    Assist        Assist Level: Dependent - Patient 0%   Wheelchair 150 feet activity     Assist      Assist Level: Dependent - Patient 0%   Blood pressure 112/68, pulse 70, temperature 98.6 F (37 C), resp. rate 18, height 5' 8 (1.727 m), weight 54.5 kg, SpO2 100%.  Medical Problem List and Plan: 1. 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 A little impulsive/quick to move, but overall did well with PT.    - 12/29: orthotics AFO consult--12/31 afternoon   - 12/30: Home with wife and parents to assist while she is at work. CGA SPT Min A UE with some trace movement of distal R. PT SPV bed, CGA transfers, walked 100 ft +1 assist Min A yesterday. Family ed Friday. Tolerating regular diet. 75% intelligable. Bsc and TTB needed for home.     2.  Antithrombotics: -DVT/anticoagulation:  Mechanical: Antiembolism stockings, thigh (TED hose) Bilateral lower extremities -antiplatelet therapy: Aspirin  81 mg daily and Brilinta  90 mg twice daily x 4 weeks then aspirin  alone - 12/26: Right lower extremity duplex due to mild leukocytosis, pain and range of motion deficit. Neg for DVT 12/27  3. Pain Management: Oxycodone  5 mg every 6 hours as needed 12/24: Add Tylenol  650 mg Q6H PRN for mild to moderate pain, baclofen  5 mg TID PRN for spasms.  Lidoderm  patch over right hip for GTB pain. 12/25: Pain continues, not using PRNs, continue current management; improved  4. Mood/Behavior/Sleep: Provide emotional support             -antipsychotic agents: N/A 5. Neuropsych/cognition:  This patient is capable of making decisions on his own behalf. 6. Skin/Wound Care: Routine skin checks  - Monitor diffuse bruising on right flank, stable   - 12/25: DC IV  7. Fluids/Electrolytes/Nutrition: Routine in and outs with follow-up chemistries, continue vitamins/supplements  - Admission labs stable; repeat 12/26, 1/1--stable  8.  Hyperlipidemia.  Crestor  20mg  daily 9.  History of alcohol as  well as tobacco use.  Continue Nicorette  gum prn.  Latest alcohol level 155 03/17/2024.  Provide counseling  10.  Hypertension.  Home medications amlodipine  5 mg daily, lisinopril  20 mg daily.  Goal BP less than 220/110.  Per discharge, gradually resume antihypertensive over next 2 weeks based on BP. 12/23: Monitor with therapies today, will resume lisinopril  at low-dose 5 mg in a.m.  - normotensive - monitor  Vitals:   04/04/24 2108 04/05/24 0608 04/05/24 1515 04/05/24 2027  BP: 108/73 127/71 105/71 116/80   04/06/24 0433 04/06/24 1404 04/06/24 2014 04/07/24 0521  BP: 121/75 117/79 110/74 118/68   04/07/24 0907 04/07/24 1445 04/07/24 1936 04/08/24 0549  BP: 119/65 117/83 (!) 135/92 112/68     11.  Anemia.  Hemoglobin 12.1->10.7 on admission.  FOBT, H&H in AM.    - Continue aspirin  and Brilinta  for now 12/24: h/h stable 10.7; FOBT pending 12-26 hemoglobin stable, 11--> 10.7 12/27  12.  Thrombocytosis.  Likely reactive, trend.  - Staying in the 400s; monitor  13. Bowel incontinence/patient. Timed toiletting.   - Last bowel movement 12-23: Start Colace 100 mg twice daily   - 12/26: add sennakot 8.6mg  nightly  - Patient endorsing regular bowel movements  14.  Leukocytosis.  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  16.  Spasticity due to  left-sided hemiparesis  - AFO consult pending as above  - 12-30: Start scheduled baclofen  5 mg 3 times daily  1-1: Tolerating AFO well.  Increase baclofen  to 10 mg 3 times daily.  Discussed possible Botox end of this week, patient is agreeable.  1/2: Doing very well will baclofen  increase, using knee spasicity for ambulation so will re-assess need for injections Monday  LOS: 11 days A FACE TO FACE EVALUATION WAS PERFORMED  Isaac Meza 04/08/2024, 12:39 PM     "

## 2024-04-08 NOTE — Progress Notes (Signed)
 Orthopedic Tech Progress Note Patient Details:  Isaac Meza 05-15-1970 [id number] [facility] order called in. Waiting for representative to arrive. Patient ID: Isaac Meza, male   DOB: 06-22-1970, 54 y.o.   MRN: [id number]  Isaac Meza 04/08/2024, 6:34 AM

## 2024-04-08 NOTE — Progress Notes (Signed)
 Physical Therapy Session Note  Patient Details  Name: Isaac Meza MRN: [id number] Date of Birth: [birth year]  Today's Date: 04/08/2024 PT Individual Time: 9195-9154 PT Individual Time Calculation (min): 41 min   Today's Date: 04/08/2024 PT Individual Time: 8665-8555 PT Individual Time Calculation (min): 70 min   Short Term Goals: Week 2:  PT Short Term Goal 1 (Week 2): STGs = LTGs  Skilled Therapeutic Interventions/Progress Updates:     1st Session: Pt received semi reclined in bed anda grees t otherapy. No complaint of pain. Supine to sit with cues sequencing and positioning. PT assists to don shoes with Rt AFO. Pt performs stand pivot to WC with minA and cues for body mechanics. WC transport to gym. Pt performs multiple reps of sit to stand during session, working on transfer training and strengthening of RLE. Pt performs Rt<>Lt weight shifting in standing, with emphasis on engagement of LLE muscles to provide base of support. Pt then tasked with ambulates x20' with 180 degree turn, attempting to complete with as little assistance as possible. PT places shoe cover over toe of Rt foot to decrease friction and promote improved swing through. Pt completes x2 bouts with PT, requiring up to maxA for complete LOBs, and requiring minA consistently for upright posture and stability. Pt's wife then provides assistance for pt and pt ambulates same bout, appearing to require modA overall. Following rest break, pt ambulates x75' with Rt HHA and minA overall, with much improved gait mechanics and safety. WC transport back to room. Left seated with all needs within reach   2nd Session: Pt received semi reclined in bed and agrees to therapy. No complaint of pain. Supine to sit with cues sequencing and positioning. PT assists to don shoes with Rt AFO. Pt performs stand pivot to WC with minA and cues for body mechanics. WC transport to gym. Pt performs multiple bouts of ambulation with hurrycane and PT  positioned on Rt, supporting RUE to provide comfort and tactile feedback on Rt side. Pt ambulates 2x40' with miNA overall, with cues for posture, reciprocal gait pattern, and attempting to improve heel strike at initial contact on LLE to promote optimal gait mechanics.    Pt performs stand step transfer form WC to mat table, and has complete LOB to the Rt, having minimal control of descent to mat. PT allows pt to make mistake to reinforce importance of safety and engaging LLE muscle groups when mobilizing. Pt then performs foot taps in 3 inch step with LLE to promote RLE weightbearing and stance control. PT provides minA at Rt knee to facilitate stability, and pt completes x5 without upper extremity support. Pt completes 2x10 with seated rest breaks. Mirror provided for visual feedback.  Pt ambulates x20' to Nustep without AD, with minA and cues for sequencing. Pt completes Nustep for RLE NMR and reciprocal coordination training. Pt completes x10:00 at workload of 3 with average steps per minute ~30. Pt performs stand step from Nustep >WC>bed with minA. Left supine with all needs within reach    Therapy Documentation Precautions:  Precautions Precautions: Fall Precaution/Restrictions Comments: R-hemiplegia; Dysarthric Restrictions Weight Bearing Restrictions Per Provider Order: No    Therapy/Group: Individual Therapy  Elsie JAYSON Dawn, PT, DPT 04/08/2024, 3:56 PM

## 2024-04-09 NOTE — Progress Notes (Signed)
 Physical Therapy Session Note  Patient Details  Name: Isaac Meza MRN: [id number] Date of Birth: 08-Feb-1971  Today's Date: 04/09/2024 PT Individual Time: 0930-1040  PT Individual Time Calculation (min): 70 min  Short Term Goals: Week 2:  PT Short Term Goal 1 (Week 2): STGs = LTGs  Skilled Therapeutic Interventions/Progress Updates:  Chart reviewed and pt agreeable to therapy. Pt received semi-reclined in bed with 0/10 c/o pain. Session focused on bed mobility, functional transfers, and NMR  to promote functional recovery and safe home mobility and access. Pt initiated session with transfer to EOB from flat bed using S. Pt then required maxA for AFO placement. Pt then completed SPT to WC using light CGA + no AD. Pt noted to have good safety awareness for setting up SPT. Pt taken to therapy gym for time management. In gym, pt completed SPT to NuStep again with CGA. Pt then practiced + 5 mins + 1 min BLE mobility in NuStep at workloads 1-2 with emphasis on RLE leg press power. Pt required frequent VC to avoid compensatory movements and focus on RLE leg press. Pt then transferred to [facility] and then mat table with same SPT assist level. On mat table, pt completed blocked practice of RLE exercises including 3x10 sets of glute bridges in neutral alignment, glute bridges with staggered feet to encourage RLE muscle recruitment, and single leg glute bridges using RLE. Pt noted to be able to lift RLE off mat table, though pt did not reach full R hip ext with single leg glute bridge. Pt then attempted RLE quad sets, with pt noted to have inconsistent 1/5 MMT in R quad. Pt also completed 3x10 BLE hip abd in supine with light manual resistance. Pt then returned to [facility] and completed 563ft WC mobility with S. Pt returned to bed using CGA for SPT. Session education emphasized continuing bed exercises to promote NMR of RLE. At end of session, pt was left semi-reclined in bed with alarm engaged, nurse call bell and all  needs in reach.     Therapy Documentation Precautions:  Precautions Precautions: Fall Precaution/Restrictions Comments: R-hemiplegia; Dysarthric Restrictions Weight Bearing Restrictions Per Provider Order: No General:      Therapy/Group: Individual Therapy   [name] 04/09/2024, 12:04 PM

## 2024-04-09 NOTE — Progress Notes (Signed)
 "                                                        PROGRESS NOTE   Subjective/Complaints:  Pt doing well, slept well, pain well managed, LBM an hour ago, urinating fine, no other complaints or concerns.    ROS: Denies fevers, chills, N/V, abdominal pain, constipation, diarrhea, SOB, cough, chest pain, new weakness or paraesthesias.   + Right sided spasticity  Objective:   No results found.  Recent Labs    04/07/24 0609  WBC 12.0*  HGB 11.0*  HCT 32.0*  PLT 411*   Recent Labs    04/07/24 0609  NA 138  K 4.5  CL 102  CO2 27  GLUCOSE 111*  BUN 9  CREATININE 0.75  CALCIUM  9.0    Intake/Output Summary (Last 24 hours) at 04/09/2024 1023 Last data filed at 04/09/2024 0851 Gross per 24 hour  Intake 610 ml  Output 1100 ml  Net -490 ml     Physical Exam: Vital Signs Blood pressure 129/70, pulse 82, temperature 98.8 F (37.1 C), temperature source Oral, resp. rate 18, height 5' 8 (1.727 m), weight 54.5 kg, SpO2 100%.  General: No acute distress.  Sitting up in bed working with PT.   Mood and affect are appropriate, though a little flat Heart: Regular rate and rhythm no rubs murmurs or extra sounds Lungs: CTAB no w/r/r, no cough during exam Abdomen: Positive bowel sounds, soft nontender to palpation, nondistended Extremities: No clubbing, cyanosis, or edema Skin: No evidence of breakdown, no evidence of rash over exposed surfaces + Bruising over right hip, flank-resolving  PRIOR EXAMS: Neurologic:  Awake, alert, and oriented x 3.  Cognition intact. Cranial nerves II through XII intact motor strength is 5/5 in left  1+/5 RUE, 1+/5 RLE proximal and 0/5 distal   Sensory exam normal sensation to light touch  in bilateral upper and lower extremities No ataxia.  Tone: MAS 1+ right knee flexor and MAS 1+ right finger flexors, wrist flexor,  R elbow  Musculoskeletal:  No apparent deformity   Assessment/Plan: 1. Functional deficits which require 3+ hours per  day of interdisciplinary therapy in a comprehensive inpatient rehab setting. Physiatrist is providing close team supervision and 24 hour management of active medical problems listed below. Physiatrist and rehab team continue to assess barriers to discharge/monitor patient progress toward functional and medical goals  Care Tool:  Bathing    Body parts bathed by patient: Chest, Right arm, Abdomen, Front perineal area, Right upper leg, Left upper leg, Right lower leg, Left lower leg, Face, Buttocks   Body parts bathed by helper: Left arm     Bathing assist Assist Level: Minimal Assistance - Patient > 75%     Upper Body Dressing/Undressing Upper body dressing   What is the patient wearing?: Pull over shirt    Upper body assist Assist Level: Minimal Assistance - Patient > 75%    Lower Body Dressing/Undressing Lower body dressing      What is the patient wearing?: Pants, Underwear/pull up     Lower body assist Assist for lower body dressing: Minimal Assistance - Patient > 75%     Toileting Toileting    Toileting assist Assist for toileting: Minimal Assistance - Patient > 75%     Transfers Chair/bed transfer  Transfers assist     Chair/bed transfer assist level: Minimal Assistance - Patient > 75%     Locomotion Ambulation   Ambulation assist      Assist level: 2 helpers Assistive device:  (Lt handrail) Max distance: 30'   Walk 10 feet activity   Assist     Assist level: 2 helpers Assistive device:  (Left handrail)   Walk 50 feet activity   Assist Walk 50 feet with 2 turns activity did not occur: Safety/medical concerns         Walk 150 feet activity   Assist Walk 150 feet activity did not occur: Safety/medical concerns         Walk 10 feet on uneven surface  activity   Assist Walk 10 feet on uneven surfaces activity did not occur: Safety/medical concerns         Wheelchair     Assist Is the patient using a wheelchair?:  Yes Type of Wheelchair: Manual    Wheelchair assist level: Dependent - Patient 0% Max wheelchair distance: 150'    Wheelchair 50 feet with 2 turns activity    Assist        Assist Level: Dependent - Patient 0%   Wheelchair 150 feet activity     Assist      Assist Level: Dependent - Patient 0%   Blood pressure 129/70, pulse 82, temperature 98.8 F (37.1 C), temperature source Oral, resp. rate 18, height 5' 8 (1.727 m), weight 54.5 kg, SpO2 100%.  Medical Problem List and Plan: 1. 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 A little impulsive/quick to move, but overall did well with PT.    - 12/29: orthotics AFO consult--12/31 afternoon  - 12/30: Home with wife and parents to assist while she is at work. CGA SPT Min A UE with some trace movement of distal R. PT SPV bed, CGA transfers, walked 100 ft +1 assist Min A yesterday. Family ed Friday. Tolerating regular diet. 75% intelligable. Bsc and TTB needed for home.     2.  Antithrombotics: -DVT/anticoagulation:  Mechanical: Antiembolism stockings, thigh (TED hose) Bilateral lower extremities -antiplatelet therapy: Aspirin  81 mg daily and Brilinta  90 mg twice daily x 4 weeks then aspirin  alone - 12/26: Right lower extremity duplex due to mild leukocytosis, pain and range of motion deficit. Neg for DVT 12/27  3. Pain Management: Oxycodone  5 mg every 6 hours as needed 12/24: Add Tylenol  650 mg Q6H PRN for mild to moderate pain, baclofen  5 mg TID PRN for spasms.  Lidoderm  patch over right hip for GTB pain. 12/25: Pain continues, not using PRNs, continue current management; improved  4. Mood/Behavior/Sleep: Provide emotional support             -antipsychotic agents: N/A 5.  Neuropsych/cognition: This patient is capable of making decisions on his own behalf. 6. Skin/Wound Care: Routine skin checks  - Monitor diffuse bruising on right flank, stable   - 12/25: DC IV  7. Fluids/Electrolytes/Nutrition: Routine in and outs with follow-up chemistries, continue vitamins/supplements  - Admission labs stable; repeat 12/26, 1/1--stable  8.  Hyperlipidemia.  Crestor  20mg  daily 9.  History of alcohol as  well as tobacco use.  Continue Nicorette  gum prn.  Latest alcohol level 155 03/17/2024.  Provide counseling  10.  Hypertension.  Home medications amlodipine  5 mg daily, lisinopril  20 mg daily.  Goal BP less than 220/110.  Per discharge, gradually resume antihypertensive over next 2 weeks based on BP. 12/23: Monitor with therapies today, will resume lisinopril  at low-dose 5 mg in a.m.  -04/09/24 normotensive - monitor  Vitals:   04/06/24 0433 04/06/24 1404 04/06/24 2014 04/07/24 0521  BP: 121/75 117/79 110/74 118/68   04/07/24 0907 04/07/24 1445 04/07/24 1936 04/08/24 0549  BP: 119/65 117/83 (!) 135/92 112/68   04/08/24 1757 04/08/24 1952 04/09/24 0524 04/09/24 0822  BP: 111/74 135/82 129/70 129/70     11.  Anemia.  Hemoglobin 12.1->10.7 on admission.  FOBT, H&H in AM.    - Continue aspirin  and Brilinta  for now 12/24: h/h stable 10.7; FOBT pending 12-26 hemoglobin stable, 11--> 10.7 12/27  12.  Thrombocytosis.  Likely reactive, trend.  - Staying in the 400s; monitor  13. Bowel incontinence/patient. Timed toiletting.   - Last bowel movement 12-23: Start Colace 100 mg twice daily   - 12/26: add sennakot 8.6mg  nightly  - Patient endorsing regular bowel movements-- LBM 04/09/24  14.  Leukocytosis.  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   16.  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  Increase baclofen  to 10 mg 3 times daily.  Discussed possible Botox end of this week, patient is agreeable. 1/2: Doing very well will baclofen  increase, using knee spasicity for ambulation so will re-assess need for injections Monday  LOS: 12 days A FACE TO FACE EVALUATION WAS PERFORMED  [address] 04/09/2024, 10:23 AM     "

## 2024-04-09 NOTE — Plan of Care (Signed)
" °  Problem: Consults Goal: RH STROKE PATIENT EDUCATION Description: See Patient Education module for education specifics  Outcome: Progressing   Problem: RH BOWEL ELIMINATION Goal: RH STG MANAGE BOWEL WITH ASSISTANCE Description: STG Manage Bowel with supervision Assistance. Outcome: Progressing   Problem: RH BLADDER ELIMINATION Goal: RH STG MANAGE BLADDER WITH ASSISTANCE Description: STG Manage Bladder With supervision Assistance Outcome: Progressing   Problem: RH SKIN INTEGRITY Goal: RH STG SKIN FREE OF INFECTION/BREAKDOWN Description: Manage skin free of infection with supervision assistance Outcome: Progressing   Problem: RH SAFETY Goal: RH STG ADHERE TO SAFETY PRECAUTIONS W/ASSISTANCE/DEVICE Description: STG Adhere to Safety Precautions With Assistance/Device. Outcome: Progressing   Problem: RH PAIN MANAGEMENT Goal: RH STG PAIN MANAGED AT OR BELOW PT'S PAIN GOAL Description: <45 w/ prns Outcome: Progressing   Problem: RH KNOWLEDGE DEFICIT Goal: RH STG INCREASE KNOWLEDGE OF HYPERTENSION Description: Manage increase  knowledge of hypertension from wife and family with supervision assistance using educational materials provided Outcome: Progressing Goal: RH STG INCREASE KNOWLEGDE OF HYPERLIPIDEMIA Description: Manage increase  knowledge of hyperlipidemia from wife and family with supervision assistance using educational materials provided Outcome: Progressing Goal: RH STG INCREASE KNOWLEDGE OF STROKE PROPHYLAXIS Description: Manage increase  knowledge of stroke prophylaxis from wife and family with supervision assistance using educational materials provided Outcome: Progressing   "

## 2024-04-09 NOTE — Progress Notes (Signed)
 Occupational Therapy Session Note  Patient Details  Name: Bela Nyborg MRN: [id number] Date of Birth: 1970/05/17  Today's Date: 04/09/2024 OT Individual Time: 0200-0245 OT Individual Time Calculation (min): 45 min    Short Term Goals: Week 1:  OT Short Term Goal 1 (Week 1): Pt will complete LB dressing with Min A. OT Short Term Goal 1 - Progress (Week 1): Met OT Short Term Goal 2 (Week 1): Pt will perform toilet transfer with consistent Min A + LRAD. OT Short Term Goal 2 - Progress (Week 1): Met OT Short Term Goal 3 (Week 1): Pt will standing balance during ADLs with Min A + LRAD. OT Short Term Goal 3 - Progress (Week 1): Met  Skilled Therapeutic Interventions/Progress Updates:    Patient laying in bed at the time of arrival, the pt was able to transfer from bed LOF to EOB with closeS using the bed rail.  The pt was able to transfer from EOB to w/c LOF using the arm of the w/c with CGA. The pt tolerated manual manipulation of the scapular and surrounding structures to improve communication to the extremity for gains in functional Ind. The pt tolerated PROM of the RUE in various planes 2 sets of 10.  The pt was instructed in AAROM of the RUE for effective carryover in the home.  The pt was able to complete sit to stand  4 x from w/c LOF for gains with safe functional transfer. At the end of the session, the pt was able to transfer from w/c LOF to EOB with MinA to CGA using the bed rail and the arm of the w/c/  The pt was able to manage BLE unto the bed with SBA. Prior to exiting the room all additional needs were addressed with the call light  was put in place with  bedside alarm activated.   Therapy Documentation Precautions:  Precautions Precautions: Fall Precaution/Restrictions Comments: R-hemiplegia; Dysarthric Restrictions Weight Bearing Restrictions Per Provider Order: No  Therapy/Group: Individual Therapy  [name] 04/09/2024, 6:49 PM

## 2024-04-10 NOTE — Plan of Care (Signed)
  Problem: Consults Goal: RH STROKE PATIENT EDUCATION Description: See Patient Education module for education specifics  Outcome: Progressing   Problem: RH BOWEL ELIMINATION Goal: RH STG MANAGE BOWEL WITH ASSISTANCE Description: STG Manage Bowel with supervision Assistance. Outcome: Progressing   Problem: RH BLADDER ELIMINATION Goal: RH STG MANAGE BLADDER WITH ASSISTANCE Description: STG Manage Bladder With  supervision Assistance Outcome: Progressing   Problem: RH SKIN INTEGRITY Goal: RH STG SKIN FREE OF INFECTION/BREAKDOWN Description: Manage skin free of infection with supervision assistance Outcome: Progressing

## 2024-04-10 NOTE — Progress Notes (Signed)
 Occupational Therapy Session Note  Patient Details  Name: Isaac Meza MRN: [id number] Date of Birth: 1970-12-13  Today's Date: 04/10/2024 OT Individual Time: 9084-8969 OT Individual Time Calculation (min): 75 min    Short Term Goals: Week 1:  OT Short Term Goal 1 (Week 1): Pt will complete LB dressing with Min A. OT Short Term Goal 1 - Progress (Week 1): Met OT Short Term Goal 2 (Week 1): Pt will perform toilet transfer with consistent Min A + LRAD. OT Short Term Goal 2 - Progress (Week 1): Met OT Short Term Goal 3 (Week 1): Pt will standing balance during ADLs with Min A + LRAD. OT Short Term Goal 3 - Progress (Week 1): Met  Skilled Therapeutic Interventions/Progress Updates:     Initial Encourter: Patient resting in bed watching TV at the time of arrival.  The pt reported at pain response of an 8 on a 0-10 for the R side with lower back. The pt was in agreement with bathing a sink LOF for starting his day.   BADL Related Task: The pt was able to transfer from supine in bed to EOB with SBA. The pt was able to transfer from EOB to w/c LOF using the arm of the w/c and the bed at [facility]. The pt was able to wash his face and brush his teeth with s/uA.    NMR:  The pt went on to tolerate NMR of the RUE inclusive of the scapular and surrounding structures the neck, shld, arm, forearm, hand and digits for funtional gains with the RUE as an assist.   UB Exercises:The pt completed UB strengthening for gains with BADL related task performance. The pt was able to complete 2 sets of 15 using a 3lb dumb bell for shld flex, horizontal abduction, elbow extension and lifts with rest breaks as needed. The pt required 2 rest breaks. .At the end of the session, the pt was transported back to his room and was able to transfer to bed LOF with CGA using the arm of the w/c for placement EOB. The pt was able to transfer BLE onto the bed with MinA. Prior to me exiting the room, the call light and bedside table were  placed nearby with all additional needs addressed.   Therapy Documentation Precautions:  Precautions Precautions: Fall Precaution/Restrictions Comments: R-hemiplegia; Dysarthric Restrictions Weight Bearing Restrictions Per Provider Order: No  Therapy/Group: Individual Therapy  [name] 04/10/2024, 12:34 PM

## 2024-04-10 NOTE — Progress Notes (Signed)
 "                                                        PROGRESS NOTE   Subjective/Complaints:  Pt doing well again, slept well, pain well managed, LBM yesterday, urinating fine, no other complaints or concerns.    ROS: Denies fevers, chills, N/V, abdominal pain, constipation, diarrhea, SOB, cough, chest pain, new weakness or paraesthesias.   + Right sided spasticity  Objective:   No results found.  No results for input(s): WBC, HGB, HCT, PLT in the last 72 hours.  No results for input(s): NA, K, CL, CO2, GLUCOSE, BUN, CREATININE, CALCIUM  in the last 72 hours.   Intake/Output Summary (Last 24 hours) at 04/10/2024 1049 Last data filed at 04/10/2024 0823 Gross per 24 hour  Intake 993 ml  Output 1075 ml  Net -82 ml     Physical Exam: Vital Signs Blood pressure (!) 101/59, pulse 68, temperature 98.7 F (37.1 C), temperature source Oral, resp. rate 18, height 5' 8 (1.727 m), weight 54.5 kg, SpO2 99%.  General: No acute distress.  Sitting up in bed  Mood and affect are appropriate, though a little flat Heart: Regular rate and rhythm no rubs murmurs or extra sounds Lungs: CTAB no w/r/r, no cough during exam Abdomen: Positive bowel sounds, soft nontender to palpation, nondistended Extremities: No clubbing, cyanosis, or edema Skin: No evidence of breakdown, no evidence of rash over exposed surfaces + Bruising over right hip, flank-resolving  PRIOR EXAMS: Neurologic:  Awake, alert, and oriented x 3.  Cognition intact. Cranial nerves II through XII intact motor strength is 5/5 in left  1+/5 RUE, 1+/5 RLE proximal and 0/5 distal   Sensory exam normal sensation to light touch  in bilateral upper and lower extremities No ataxia.  Tone: MAS 1+ right knee flexor and MAS 1+ right finger flexors, wrist flexor,  R elbow  Musculoskeletal:  No apparent deformity   Assessment/Plan: 1. Functional deficits which require 3+ hours per day of interdisciplinary  therapy in a comprehensive inpatient rehab setting. Physiatrist is providing close team supervision and 24 hour management of active medical problems listed below. Physiatrist and rehab team continue to assess barriers to discharge/monitor patient progress toward functional and medical goals  Care Tool:  Bathing    Body parts bathed by patient: Chest, Right arm, Abdomen, Front perineal area, Right upper leg, Left upper leg, Right lower leg, Left lower leg, Face, Buttocks   Body parts bathed by helper: Left arm     Bathing assist Assist Level: Minimal Assistance - Patient > 75%     Upper Body Dressing/Undressing Upper body dressing   What is the patient wearing?: Pull over shirt    Upper body assist Assist Level: Minimal Assistance - Patient > 75%    Lower Body Dressing/Undressing Lower body dressing      What is the patient wearing?: Pants, Underwear/pull up     Lower body assist Assist for lower body dressing: Minimal Assistance - Patient > 75%     Toileting Toileting    Toileting assist Assist for toileting: Minimal Assistance - Patient > 75%     Transfers Chair/bed transfer  Transfers assist     Chair/bed transfer assist level: Minimal Assistance - Patient > 75%     Locomotion Ambulation   Ambulation  assist      Assist level: 2 helpers Assistive device:  (Lt handrail) Max distance: 30'   Walk 10 feet activity   Assist     Assist level: 2 helpers Assistive device:  (Left handrail)   Walk 50 feet activity   Assist Walk 50 feet with 2 turns activity did not occur: Safety/medical concerns         Walk 150 feet activity   Assist Walk 150 feet activity did not occur: Safety/medical concerns         Walk 10 feet on uneven surface  activity   Assist Walk 10 feet on uneven surfaces activity did not occur: Safety/medical concerns         Wheelchair     Assist Is the patient using a wheelchair?: Yes Type of Wheelchair:  Manual    Wheelchair assist level: Dependent - Patient 0% Max wheelchair distance: 150'    Wheelchair 50 feet with 2 turns activity    Assist        Assist Level: Dependent - Patient 0%   Wheelchair 150 feet activity     Assist      Assist Level: Dependent - Patient 0%   Blood pressure (!) 101/59, pulse 68, temperature 98.7 F (37.1 C), temperature source Oral, resp. rate 18, height 5' 8 (1.727 m), weight 54.5 kg, SpO2 99%.  Medical Problem List and Plan: 1. 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 A little impulsive/quick to move, but overall did well with PT.    - 12/29: orthotics AFO consult--12/31 afternoon  - 12/30: Home with wife and parents to assist while she is at work. CGA SPT Min A UE with some trace movement of distal R. PT SPV bed, CGA transfers, walked 100 ft +1 assist Min A yesterday. Family ed Friday. Tolerating regular diet. 75% intelligable. Bsc and TTB needed for home.     2.  Antithrombotics: -DVT/anticoagulation:  Mechanical: Antiembolism stockings, thigh (TED hose) Bilateral lower extremities -antiplatelet therapy: Aspirin  81 mg daily and Brilinta  90 mg twice daily x 4 weeks then aspirin  alone - 12/26: Right lower extremity duplex due to mild leukocytosis, pain and range of motion deficit. Neg for DVT 12/27  3. Pain Management: Oxycodone  5 mg every 6 hours as needed 12/24: Add Tylenol  650 mg Q6H PRN for mild to moderate pain, baclofen  5 mg TID PRN for spasms.  Lidoderm  patch over right hip for GTB pain. 12/25: Pain continues, not using PRNs, continue current management; improved  4. Mood/Behavior/Sleep: Provide emotional support             -antipsychotic agents: N/A 5. Neuropsych/cognition: This patient is  capable of making decisions on his own behalf. 6. Skin/Wound Care: Routine skin checks  - Monitor diffuse bruising on right flank, stable   - 12/25: DC IV  7. Fluids/Electrolytes/Nutrition: Routine in and outs with follow-up chemistries, continue vitamins/supplements  - Admission labs stable; repeat 12/26, 1/1--stable  8.  Hyperlipidemia.  Crestor  20mg  daily  9.  History of alcohol as well as tobacco use.  Continue Nicorette  gum prn.  Latest alcohol level 155 03/17/2024.  Provide counseling  10.  Hypertension.  Home medications amlodipine  5 mg daily, lisinopril  20 mg daily.  Goal BP less than 220/110.  Per discharge, gradually resume antihypertensive over next 2 weeks based on BP. 12/23: Monitor with therapies today, will resume lisinopril  at low-dose 5 mg in a.m.  -1/3-4/26 normotensive - monitor  Vitals:   04/07/24 0521 04/07/24 0907 04/07/24 1445 04/07/24 1936  BP: 118/68 119/65 117/83 (!) 135/92   04/08/24 0549 04/08/24 1757 04/08/24 1952 04/09/24 0524  BP: 112/68 111/74 135/82 129/70   04/09/24 0822 04/09/24 1359 04/09/24 2020 04/10/24 0455  BP: 129/70 126/83 121/75 (!) 101/59     11.  Anemia.  Hemoglobin 12.1->10.7 on admission.  FOBT, H&H in AM.    - Continue aspirin  and Brilinta  for now 12/24: h/h stable 10.7; FOBT pending 12-26 hemoglobin stable, 11--> 10.7 12/27  12.  Thrombocytosis.  Likely reactive, trend.  - Staying in the 400s; monitor  13. Bowel incontinence/patient. Timed toiletting.   - Last bowel movement 12-23: Start Colace 100 mg twice daily   - 12/26: add sennakot 8.6mg  nightly  - Patient endorsing regular bowel movements-- LBM 04/09/24  14.  Leukocytosis.  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   16.  Spasticity due  to left-sided hemiparesis  - AFO consult pending as above  - 12-30: Start scheduled baclofen  5 mg 3 times daily 1-1: Tolerating AFO well.  Increase baclofen  to 10 mg 3 times daily.  Discussed possible Botox end of this week, patient is agreeable. 1/2: Doing very well will baclofen  increase, using knee spasicity for ambulation so will re-assess need for injections Monday  LOS: 13 days A FACE TO FACE EVALUATION WAS PERFORMED  [address] 04/10/2024, 10:49 AM     "

## 2024-04-11 ENCOUNTER — Other Ambulatory Visit (HOSPITAL_COMMUNITY): Payer: Self-pay

## 2024-04-11 LAB — CBC WITH DIFFERENTIAL/PLATELET
Abs Immature Granulocytes: 0.04 K/uL (ref 0.00–0.07)
Basophils Absolute: 0 K/uL (ref 0.0–0.1)
Basophils Relative: 0 %
Eosinophils Absolute: 0.2 K/uL (ref 0.0–0.5)
Eosinophils Relative: 2 %
HCT: 33.5 % — ABNORMAL LOW (ref 39.0–52.0)
Hemoglobin: 11.5 g/dL — ABNORMAL LOW (ref 13.0–17.0)
Immature Granulocytes: 0 %
Lymphocytes Relative: 15 %
Lymphs Abs: 1.7 K/uL (ref 0.7–4.0)
MCH: 32.7 pg (ref 26.0–34.0)
MCHC: 34.3 g/dL (ref 30.0–36.0)
MCV: 95.2 fL (ref 80.0–100.0)
Monocytes Absolute: 1.1 K/uL — ABNORMAL HIGH (ref 0.1–1.0)
Monocytes Relative: 10 %
Neutro Abs: 8.1 K/uL — ABNORMAL HIGH (ref 1.7–7.7)
Neutrophils Relative %: 73 %
Platelets: 440 K/uL — ABNORMAL HIGH (ref 150–400)
RBC: 3.52 MIL/uL — ABNORMAL LOW (ref 4.22–5.81)
RDW: 11.9 % (ref 11.5–15.5)
WBC: 11.2 K/uL — ABNORMAL HIGH (ref 4.0–10.5)
nRBC: 0 % (ref 0.0–0.2)

## 2024-04-11 LAB — BASIC METABOLIC PANEL WITH GFR
Anion gap: 8 (ref 5–15)
BUN: 7 mg/dL (ref 6–20)
CO2: 28 mmol/L (ref 22–32)
Calcium: 9.1 mg/dL (ref 8.9–10.3)
Chloride: 100 mmol/L (ref 98–111)
Creatinine, Ser: 0.81 mg/dL (ref 0.61–1.24)
GFR, Estimated: >60 mL/min
Glucose, Bld: 110 mg/dL — ABNORMAL HIGH (ref 70–99)
Potassium: 4.5 mmol/L (ref 3.5–5.1)
Sodium: 137 mmol/L (ref 135–145)

## 2024-04-11 MED ORDER — LISINOPRIL 5 MG PO TABS
5.0000 mg | ORAL_TABLET | Freq: Every day | ORAL | 0 refills | Status: DC
  Filled 2024-04-11: qty 30, 30d supply, fill #0

## 2024-04-11 MED ORDER — ROSUVASTATIN CALCIUM 20 MG PO TABS
20.0000 mg | ORAL_TABLET | Freq: Every day | ORAL | 0 refills | Status: DC
  Filled 2024-04-11: qty 30, 30d supply, fill #0

## 2024-04-11 MED ORDER — TICAGRELOR 90 MG PO TABS
90.0000 mg | ORAL_TABLET | Freq: Two times a day (BID) | ORAL | 0 refills | Status: DC
  Filled 2024-04-11: qty 20, 10d supply, fill #0

## 2024-04-11 MED ORDER — FOLIC ACID 1 MG PO TABS
1.0000 mg | ORAL_TABLET | Freq: Every day | ORAL | 0 refills | Status: DC
  Filled 2024-04-11: qty 30, 30d supply, fill #0

## 2024-04-11 MED ORDER — NICOTINE POLACRILEX 2 MG MT GUM
2.0000 mg | CHEWING_GUM | OROMUCOSAL | 0 refills | Status: DC | PRN
  Filled 2024-04-11: qty 100, fill #0

## 2024-04-11 MED ORDER — BACLOFEN 10 MG PO TABS
10.0000 mg | ORAL_TABLET | Freq: Three times a day (TID) | ORAL | 0 refills | Status: DC
  Filled 2024-04-11: qty 90, 30d supply, fill #0

## 2024-04-11 MED ORDER — OXYCODONE HCL 5 MG PO TABS
5.0000 mg | ORAL_TABLET | Freq: Four times a day (QID) | ORAL | 0 refills | Status: AC | PRN
  Filled 2024-04-11: qty 20, 5d supply, fill #0

## 2024-04-11 MED ORDER — THIAMINE HCL 100 MG PO TABS
100.0000 mg | ORAL_TABLET | Freq: Every day | ORAL | 0 refills | Status: DC
  Filled 2024-04-11: qty 30, 30d supply, fill #0

## 2024-04-11 NOTE — Progress Notes (Signed)
 Inpatient Rehabilitation Discharge Medication Review by a Pharmacist  A complete drug regimen review was completed for this patient to identify any potential clinically significant medication issues.  High Risk Drug Classes Is patient taking? Indication by Medication  Antipsychotic No   Anticoagulant No   Antibiotic No   Opioid Yes Oxycodone  prn pain  Antiplatelet Yes ASA: CVA Ticagrelor  through 04/22/2024: CVA  Hypoglycemics/insulin No   Vasoactive Medication Yes Lisinopril : HTN  Chemotherapy No   Other Yes APAP prn pain Lidocaine  patch prn pain Folic Acid , MVI, Thiamine : supplement Nicorette  gum: smoking cessation Rosuvastatin : HLD Baclofen : muscle spasms Colace: bowel regimen     Type of Medication Issue Identified Description of Issue Recommendation(s)  Drug Interaction(s) (clinically significant)     Duplicate Therapy     Allergy     No Medication Administration End Date     Incorrect Dose     Additional Drug Therapy Needed     Significant med changes from prior encounter (inform family/care partners about these prior to discharge). - Amlodipine  stopped following inpatient admission due to hypotension. - Lisinopril  dose reduced due to hypotension. - Clopidogrel  replaced with Ticagrelor  for CVA Communicate medication changes with patient/family at discharge   Other       Clinically significant medication issues were identified that warrant physician communication and completion of prescribed/recommended actions by midnight of the next day:  No  Name of provider notified for urgent issues identified:   Provider Method of Notification:     Pharmacist comments:   Time spent performing this drug regimen review (minutes):  15  Cassidi Modesitt, Pharm.D., BCPS Clinical Pharmacist Clinical phone for 04/11/2024 from 7:30-3:00 is [phone].  **Pharmacist phone directory can be found on [email or web address] listed under [facility].  04/11/2024 10:51 AM

## 2024-04-11 NOTE — Progress Notes (Signed)
 Speech Language Pathology Discharge Summary  Patient Details  Name: Isaac Meza MRN: [id number] Date of Birth: February 13, 1971  Date of Discharge from SLP service:April 11, 2024   Patient has met 2 of 3 long term goals.  Patient to discharge at [facility] level.  Reasons goals not met: inconsistent mastery of speech intelligibility goal   Clinical Impression/Discharge Summary:  Pt demonstrated good progress this admission, as evidenced by mastery of 2/3 LTGs. He presents w/ improved problem solving, word finding, and speech intelligibility. Despite this, only emerging success was noted w/ speech production goal. Overall, he benefited from modA to maintain 75-80% intelligibility at sentence level w/ context. He tolerated a regular diet/thin liquids w/o difficulties. Pt/family education complete. He would benefit from continued ST upon d/c to maximize pt communication, independence, and facilitate return to prev roles/responsibilities.   Care Partner:  Caregiver Able to Provide Assistance: Yes  Type of Caregiver Assistance: Cognitive  Recommendation:  Outpatient SLP;Home Health SLP  Rationale for SLP Follow Up: Maximize functional communication   Equipment: n/a   Reasons for discharge: Discharged from hospital   Patient/Family Agrees with Progress Made and Goals Achieved: Yes    [name] 04/11/2024, 8:16 AM

## 2024-04-11 NOTE — Progress Notes (Signed)
 Speech Language Pathology Daily Session Note  Patient Details  Name: Isaac Meza MRN: [id number] Date of Birth: 1971-02-05  Today's Date: 04/11/2024 SLP Individual Time: 0900-1000 SLP Individual Time Calculation (min): 60 min  Short Term Goals: Week 2: SLP Short Term Goal 1 (Week 2): STGs = LTGs d/t ELOS  Skilled Therapeutic Interventions:   Pt greeted at bedside for tx targeting cognition and communication. EMST targeted via EMST150. Resistance was increased to 67 cmH2O and then he completed 25 reps, independently maintaining adequate technique. SLP then reviewed EMST protocol for home, including when/how to increase resistance. He also completed a written problem solving task targeting information processing and attention to detail @ modI. SLP then facilitated a verbal task (providing multiple definitions) targeting speech production and word finding. During sentence level responses, he benefited from min-modA to utilize over articulation, slow rate, and increased volume to maintain 80% intelligibility. He demonstrated adequate word finding and sentence formulation @ modI. At the end of tx tasks, he verbalized understanding of final education re compensatory speech strategies and continued ST upon d/c. He was left in bed w/ the alarm set and call light within reach. See d/c summary for more info.   Pain  Back pain 8/10. See   Therapy/Group: Individual Therapy  [name] 04/11/2024, 9:14 AM

## 2024-04-11 NOTE — Progress Notes (Addendum)
 Physical Therapy Discharge Summary  Patient Details  Name: Isaac Meza MRN: [id number] Date of Birth: 1970/07/06  Date of Discharge from PT service:April 12, 2023  Today's Date: 04/11/2024 PT Individual Time: 1002-1030 PT Individual Time Calculation (min): 28 min   Today's Date: 04/11/2024 PT Individual Time: 1432-1530 PT Individual Time Calculation (min): 58 min    Patient has met 5 of 9 long term goals due to improved activity tolerance, improved balance, improved postural control, increased strength, improved attention, and improved awareness.  Patient to discharge at an ambulatory level Min Assist.   Patient's care partner is independent to provide the necessary physical and cognitive assistance at discharge.  Reasons goals not met: Pt continues to requires CGA for dynamic standing activities due to inconsistency of performance. Pt's wife has attended family ed and is independent with care.   Recommendation:  Patient will benefit from ongoing skilled PT services in outpatient setting to continue to advance safe functional mobility, address ongoing impairments in balance, transfers, ambulation, and minimize fall risk.  Equipment: Recommended hurrycane and 16 x 16 manual WC  Reasons for discharge: discharge from hospital  Patient/family agrees with progress made and goals achieved: Yes  Skilled Therapeutic Interventions: 1st Session: Pt received semi reclined in bed and agrees tp therapy. No complaint of pain. Spuine to sit with cues for positioning. Pt performs stand pivot transfer to [facility] with cues for foot placement and body mechanics. WC transport to gym. Pt completes stand pivot transfer to<>from car with cues for sequencing. Following rest break, pt ambulates x110' with hurrycane and Rt AFO and shoe cover to reduce friction. Pt able to ambulate at times with CGA but has several slight LOBs to the Rt requiring minA. PT then provides consistent tactile cueing at trunk to promote  neutral posture and stability and pt has improved gait pattern. Cues provided for weight shifting, reciprocal gait pattern, and engagement of RLE. WC transport back to room and stand pivot to bed with cues for positioning. Left semi reclined with all needs within reach.   2nd Session: Pt received semi reclined in bed and agrees to hterpay. NO complaint of pain. Pt has new personal AFO and leather toe cap on Rt shoe. Pt performs supine to sit with cues for positioning. Stand pivot to [facility] with cues for positioning and decreased control of stand to sit. WC transport to gym. Pt completes x12 6 steps with CGA and cues for step sequencing and safety. Pt utilizes LUE support for stair navigation.  Pt steps up onto biodex with CGA andc cues for sequencing. Pt completes biodex weight distribution activity to visualize weight distribution laterally. Pt initially has >70% of weight in LLE. Pt quickly adjusts and able to maintain ~50% WB in RLE with LUE support. Pt progresses to completing with RUE support for increased NM feedback through Rt hemibody, then performs with no upper extremity support. Pt steps down from biodex with minA. Seated rest break. Pt stands and ambulates x175' with CGA and cues for reciprocal gait pattern, neutral posture, and safe positioning for transition back to WC.   Pt performs stand step transfer from [facility] to Nustep with cues for body mechanics and increased eccentric control of stand to sit for safety and strengthening. Pt completes Nustep for reciprocal coordination training and RLE NMR. Pt performs with only lower extremities at workload of 3 with average step per minute ~32. PT provides cues for foot placement and completing full available ROM.   Pt performs stand pivot from  Nustep>WC>bed with hurrycane and cues for positioning. Left semi reclined with all needs within reach.   PT Discharge Precautions/Restrictions Precautions Precautions: Fall Restrictions Weight Bearing  Restrictions Per Provider Order: No Pain Interference Pain Interference Pain Effect on Sleep: 1. Rarely or not at all Pain Interference with Therapy Activities: 1. Rarely or not at all Pain Interference with Day-to-Day Activities: 1. Rarely or not at all Vision/Perception  Vision - History Ability to See in Adequate Light: 0 Adequate Perception Perception: Within Functional Limits Praxis Praxis: WFL  Cognition Overall Cognitive Status: Impaired/Different from baseline Arousal/Alertness: Awake/alert Orientation Level: Oriented X4 Year: 2026 Month: January Day of Week: Correct Attention: Divided Divided Attention: Appears intact Memory: Appears intact Awareness: Impaired Awareness Impairment: Emergent impairment Problem Solving: Appears intact Executive Function: Organizing;Self Monitoring;Self Correcting Organizing: Impaired Self Monitoring: Impaired Self Correcting: Impaired Sensation Sensation Light Touch: Appears Intact Hot/Cold: Appears Intact Proprioception: Appears Intact Stereognosis: Not tested Coordination Gross Motor Movements are Fluid and Coordinated: No Fine Motor Movements are Fluid and Coordinated: No Motor  Motor Motor: Hemiplegia Motor - Discharge Observations: R sided hemi  Mobility Bed Mobility Bed Mobility: Supine to Sit;Sit to Supine Supine to Sit: Independent with assistive device Sit to Supine: Independent with assistive device Transfers Transfers: Sit to Stand;Stand to Sit;Stand Pivot Transfers Sit to Stand: Supervision/Verbal cueing Stand to Sit: Supervision/Verbal cueing Stand Pivot Transfers: Supervision/Verbal cueing Stand Pivot Transfer Details: Verbal cues for technique;Verbal cues for sequencing;Verbal cues for precautions/safety Transfer (Assistive device): Other (Comment) (hurrycane) Locomotion  Gait Ambulation: Yes Gait Assistance: Contact Guard/Touching assist Gait Distance (Feet): 150 Feet Assistive device: Other (Comment)  (hurrycane) Gait Assistance Details: Verbal cues for technique;Verbal cues for gait pattern;Verbal cues for sequencing Gait Gait: Yes Gait Pattern: Impaired Gait Pattern:  (Rt hemi gait) Stairs / Additional Locomotion Stairs: Yes Stairs Assistance: Contact Guard/Touching assist Stair Management Technique: One rail Left Number of Stairs: 12 Height of Stairs: 6 Ramp: Contact Guard/touching assist Curb: Contact Guard/Touching assist Wheelchair Mobility Wheelchair Mobility: No  Trunk/Postural Assessment  Cervical Assessment Cervical Assessment: Within Functional Limits Thoracic Assessment Thoracic Assessment: Within Functional Limits Lumbar Assessment Lumbar Assessment: Within Functional Limits Postural Control Postural Control: Deficits on evaluation (delayed)  Balance Balance Balance Assessed: Yes Static Sitting Balance Static Sitting - Balance Support: Feet supported Static Sitting - Level of Assistance: 7: Independent Dynamic Sitting Balance Dynamic Sitting - Balance Support: During functional activity Dynamic Sitting - Level of Assistance: 6: Modified independent (Device/Increase time) Static Standing Balance Static Standing - Balance Support: During functional activity;Left upper extremity supported Static Standing - Level of Assistance: 6: Modified independent (Device/Increase time) Dynamic Standing Balance Dynamic Standing - Balance Support: During functional activity;No upper extremity supported Dynamic Standing - Level of Assistance: 5: Stand by assistance (supervision) Extremity Assessment  RUE Assessment RUE Assessment: Exceptions to [facility] Passive Range of Motion (PROM) Comments: WFL for PROM, trace of  tone noted in R elbow Active Range of Motion (AROM) Comments: 1/5 in elbow RUE Body System: Neuro Brunstrum levels for arm and hand: Arm;Hand Brunstrum level for arm: Stage II Synergy is developing Brunstrum level for hand: Stage II Synergy is developing LUE  Assessment LUE Assessment: Within Functional Limits RLE Assessment RLE Assessment: Exceptions to [facility] RLE Strength Right Hip Flexion: 2/5 Right Hip Extension: 2/5 Right Knee Extension: 2+/5 Right Ankle Dorsiflexion: 0/5 Right Ankle Plantar Flexion: 2/5 LLE Assessment LLE Assessment: Within Functional Limits   Elsie JAYSON Dawn, PT, DPT 04/11/2024, 3:52 PM

## 2024-04-11 NOTE — Progress Notes (Signed)
 Patient ID: Isaac Meza, male   DOB: September 05, 1970, 54 y.o.   MRN: [id number]  SW ordered w/c per PT.  Graeme Jude, MSW, LCSW Office: [phone] Cell: [phone] Fax: [phone]

## 2024-04-11 NOTE — Progress Notes (Signed)
 Occupational Therapy Session Note  Patient Details  Name: Isaac Meza MRN: [id number] Date of Birth: March 25, 1971  Today's Date: 04/11/2024 OT Individual Time: 9260-9163 OT Individual Time Calculation (min): 57 min    Short Term Goals: Week 2:  OT Short Term Goal 1 (Week 2): STG= LTG d/t ELOS  Skilled Therapeutic Interventions/Progress Updates:  Pt greeted supine in bed, pt agreeable to OT intervention.      Transfers/bed mobility/functional mobility:  Pt completed supine>sit with supervision, pt completed squat pivot to w/c to L side with MIN A d/t minor LOB when scooting to L side needing MINA to recover.     ADLs:  Grooming: pt completed seated oral care at sink with MIN A needing assistance to stabilize toothbrush while pt donned paste. Did educate pt on hemi strategy of using holes in sink to stabilize brush while pt donned paste.   UB dressing:pt donned [place] shirt with supervision from EOB  LB dressing: pt donned pants and underwear with MINA needing assist to thread RLE and assist to pull pants to waist line on R side.   Footwear: donned socks and shoes/AFO with MAX A.   Bathing: pt completed bathing from sitting on TTB with close supervision. Pt able to manage washing under R arm via see saw technique.   Transfers: stand pivot transfer into walkin shower with use of grab bar and close supervision and min cues for feet placement but no physical assistance needed. Pt completed multiple sit>stands during ADLs with use of grab bar with close supervision.      Exercises: issued pt self ROM HEP for home with pt completing below therex to facilitate improved carryover for home:  shoulder flexion  Rock the baby shoulder abd/add Shoulder internal/external rotation stretch Elbow stretch Forearm stretch Wrist side/side stretch Digit flexion/extension stretch Towel slides  Pt able to return demo all therex with min cues.                   Ended session with pt supine  in bed with all needs within reach and bed alarm activated.                    Therapy Documentation Precautions:  Precautions Precautions: Fall Precaution/Restrictions Comments: R-hemiplegia; Dysarthric Restrictions Weight Bearing Restrictions Per Provider Order: No  Pain: No pain reported during session.   Therapy/Group: Individual Therapy  Ronal Gift [facility] 04/11/2024, 12:04 PM

## 2024-04-11 NOTE — Progress Notes (Signed)
 "                                                        PROGRESS NOTE   Subjective/Complaints:  No events overnight. No acute complaints. Vitals stable      04/11/2024    5:36 AM 04/10/2024    7:49 PM 04/10/2024    5:49 PM  Vitals with BMI  Systolic 113 129 865  Diastolic 68 84 87  Pulse 58 75 72    P.o. intakes appropriate  Continent of bladder  Last BM 04/09/24    ROS: Denies fevers, chills, N/V, abdominal pain, constipation, diarrhea, SOB, cough, chest pain, new weakness or paraesthesias.   + Right sided spasticity  Objective:   No results found.  Recent Labs    04/11/24 0600  WBC 11.2*  HGB 11.5*  HCT 33.5*  PLT 440*    Recent Labs    04/11/24 0600  NA 137  K 4.5  CL 100  CO2 28  GLUCOSE 110*  BUN 7  CREATININE 0.81  CALCIUM  9.1     Intake/Output Summary (Last 24 hours) at 04/11/2024 0744 Last data filed at 04/11/2024 0732 Gross per 24 hour  Intake 340 ml  Output 1775 ml  Net -1435 ml     Physical Exam: Vital Signs Blood pressure 113/68, pulse (!) 58, temperature 98.3 F (36.8 C), resp. rate 18, height 5' 8 (1.727 m), weight 54.5 kg, SpO2 100%.  General: No acute distress.  Sitting up in bed  Mood and affect are appropriate, though a little flat Heart: Regular rate and rhythm no rubs murmurs or extra sounds Lungs: CTAB no w/r/r, no cough during exam Abdomen: Positive bowel sounds, soft nontender to palpation, nondistended Extremities: No clubbing, cyanosis, or edema Skin: No evidence of breakdown, no evidence of rash over exposed surfaces + Bruising over right hip, flank-resolving  PRIOR EXAMS: Neurologic:  Awake, alert, and oriented x 3.  Cognition intact. Cranial nerves II through XII intact motor strength is 5/5 in left  1+/5 RUE, 1+/5 RLE proximal and 0/5 distal   Sensory exam normal sensation to light touch  in bilateral upper and lower extremities No ataxia.  Tone: MAS 1+ right knee flexor and MAS 1+ right finger flexors, wrist  flexor,  R elbow  Musculoskeletal:  No apparent deformity  Physical exam unchanged from the above on reexamination 04/11/2024    Assessment/Plan: 1. Functional deficits which require 3+ hours per day of interdisciplinary therapy in a comprehensive inpatient rehab setting. Physiatrist is providing close team supervision and 24 hour management of active medical problems listed below. Physiatrist and rehab team continue to assess barriers to discharge/monitor patient progress toward functional and medical goals  Care Tool:  Bathing    Body parts bathed by patient: Chest, Right arm, Abdomen, Front perineal area, Right upper leg, Left upper leg, Right lower leg, Left lower leg, Face, Buttocks   Body parts bathed by helper: Left arm     Bathing assist Assist Level: Minimal Assistance - Patient > 75%     Upper Body Dressing/Undressing Upper body dressing   What is the patient wearing?: Pull over shirt    Upper body assist Assist Level: Minimal Assistance - Patient > 75%    Lower Body Dressing/Undressing Lower body dressing      What  is the patient wearing?: Pants, Underwear/pull up     Lower body assist Assist for lower body dressing: Minimal Assistance - Patient > 75%     Toileting Toileting    Toileting assist Assist for toileting: Minimal Assistance - Patient > 75%     Transfers Chair/bed transfer  Transfers assist     Chair/bed transfer assist level: Minimal Assistance - Patient > 75%     Locomotion Ambulation   Ambulation assist      Assist level: 2 helpers Assistive device:  (Lt handrail) Max distance: 30'   Walk 10 feet activity   Assist     Assist level: 2 helpers Assistive device:  (Left handrail)   Walk 50 feet activity   Assist Walk 50 feet with 2 turns activity did not occur: Safety/medical concerns         Walk 150 feet activity   Assist Walk 150 feet activity did not occur: Safety/medical concerns         Walk 10 feet  on uneven surface  activity   Assist Walk 10 feet on uneven surfaces activity did not occur: Safety/medical concerns         Wheelchair     Assist Is the patient using a wheelchair?: Yes Type of Wheelchair: Manual    Wheelchair assist level: Dependent - Patient 0% Max wheelchair distance: 150'    Wheelchair 50 feet with 2 turns activity    Assist        Assist Level: Dependent - Patient 0%   Wheelchair 150 feet activity     Assist      Assist Level: Dependent - Patient 0%   Blood pressure 113/68, pulse (!) 58, temperature 98.3 F (36.8 C), resp. rate 18, height 5' 8 (1.727 m), weight 54.5 kg, SpO2 100%.  Medical Problem List and Plan: 1. 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 A little impulsive/quick to move, but overall did well with PT.    - 12/29: orthotics AFO consult--12/31 afternoon  - 12/30: Home with wife and parents to assist while she is at work. CGA SPT Min A UE with some trace movement of distal R. PT SPV bed, CGA transfers, walked 100 ft +1 assist Min A yesterday. Family ed Friday. Tolerating regular diet. 75% intelligable. Bsc and TTB needed for home.   - stable for DC tomorrow, will need ENT referral for vocal fold eval    2.  Antithrombotics: -DVT/anticoagulation:  Mechanical: Antiembolism stockings, thigh (TED hose) Bilateral lower extremities -antiplatelet therapy: Aspirin  81 mg daily and Brilinta  90 mg twice daily x 4 weeks then aspirin  alone - 12/26: Right lower extremity duplex due to mild leukocytosis, pain and range of motion deficit. Neg for DVT 12/27  3. Pain Management: Oxycodone  5 mg every 6 hours as needed 12/24: Add Tylenol  650 mg Q6H PRN for mild to moderate pain, baclofen  5 mg TID PRN  for spasms.  Lidoderm  patch over right hip for GTB pain. 12/25: Pain continues, not using PRNs, continue current management; improved  4. Mood/Behavior/Sleep: Provide emotional support             -antipsychotic agents: N/A 5. Neuropsych/cognition: This  patient is capable of making decisions on his own behalf. 6. Skin/Wound Care: Routine skin checks  - Monitor diffuse bruising on right flank, stable   - 12/25: DC IV  7. Fluids/Electrolytes/Nutrition: Routine in and outs with follow-up chemistries, continue vitamins/supplements  - Admission labs stable; repeat 12/26, 1/1--stable  8.  Hyperlipidemia.  Crestor  20mg  daily  9.  History of alcohol as well as tobacco use.  Continue Nicorette  gum prn.  Latest alcohol level 155 03/17/2024.  Provide counseling  10.  Hypertension.  Home medications amlodipine  5 mg daily, lisinopril  20 mg daily.  Goal BP less than 220/110.  Per discharge, gradually resume antihypertensive over next 2 weeks based on BP. 12/23: Monitor with therapies today, will resume lisinopril  at low-dose 5 mg in a.m.  - normotensive - monitor  Vitals:   04/07/24 1936 04/08/24 0549 04/08/24 1757 04/08/24 1952  BP: (!) 135/92 112/68 111/74 135/82   04/09/24 0524 04/09/24 0822 04/09/24 1359 04/09/24 2020  BP: 129/70 129/70 126/83 121/75   04/10/24 0455 04/10/24 1749 04/10/24 1949 04/11/24 0536  BP: (!) 101/59 134/87 129/84 113/68     11.  Anemia.  Hemoglobin 12.1->10.7 on admission.  FOBT, H&H in AM.    - Continue aspirin  and Brilinta  for now 12/24: h/h stable 10.7; FOBT pending 12-26 hemoglobin stable, 11--> 10.7 12/27  12.  Thrombocytosis.  Likely reactive, trend.  - Staying in the 400s; monitor  13. Bowel incontinence/patient. Timed toiletting.   - Last bowel movement 12-23: Start Colace 100 mg twice daily   - 12/26: add sennakot 8.6mg  nightly   - LBM 04/09/24  14.  Leukocytosis.  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   16.  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  Increase baclofen  to 10 mg 3 times daily.  Discussed possible Botox end of this week, patient is agreeable. 1/2: Doing very well will baclofen  increase, using knee spasicity for ambulation so will re-assess need for injections Monday--stable, will follow up as as OP  LOS: 14 days A FACE TO FACE EVALUATION WAS PERFORMED  Isaac Meza Likes 04/11/2024, 7:44 AM     "

## 2024-04-11 NOTE — Progress Notes (Signed)
 Occupational Therapy Discharge Summary  Patient Details  Name: Isaac Meza MRN: [id number] Date of Birth: 24-Jan-1971  Date of Discharge from OT service:March 11, 2024   Patient has met 2 of 9 long term goals due to improved activity tolerance, improved balance, ability to compensate for deficits, and functional use of  LEFT upper and LEFT lower extremity.  Patient to discharge at overall CGA level.  Patient's care partner is independent to provide the necessary physical assistance at discharge.    Reasons goals not met: Pt continues to require CGA-Min A for LB care and dynamic standing balance activities. Spouse completed caregiver education.   Recommendation:  Patient will benefit from ongoing skilled OT services in outpatient setting to continue to advance functional skills in the area of BADL, iADL, and Reduce care partner burden.  Equipment: BSC & WC  Reasons for discharge: discharge from hospital  Patient/family agrees with progress made and goals achieved: Yes  OT Discharge ADL ADL Eating: Set up Where Assessed-Eating: Bed level Grooming: Supervision/safety Where Assessed-Grooming: Edge of bed Upper Body Bathing: Supervision/safety Where Assessed-Upper Body Bathing: Shower Lower Body Bathing: Supervision/safety Where Assessed-Lower Body Bathing: Shower Upper Body Dressing: Supervision/safety Where Assessed-Upper Body Dressing: Edge of bed Lower Body Dressing: Minimal assistance Where Assessed-Lower Body Dressing: Edge of bed Toileting: Minimal assistance Where Assessed-Toileting: Teacher, Adult Education: Close supervision Toilet Transfer Method: Surveyor, Minerals: Gaffer: Not assessed Film/video Editor: Close supervision Film/video Editor Method: Stand pivot Vision Baseline Vision/History: 1 Wears glasses (contacts) Patient Visual Report: No change from baseline Perception  Perception: Within  Functional Limits Praxis Praxis: WFL Cognition Cognition Overall Cognitive Status: Impaired/Different from baseline Arousal/Alertness: Awake/alert Memory: Appears intact Attention: Divided Divided Attention: Appears intact Awareness: Impaired Awareness Impairment: Emergent impairment Problem Solving: Appears intact Executive Function: Organizing;Self Monitoring;Self Correcting Organizing: Impaired Self Monitoring: Impaired Self Correcting: Impaired Brief Interview for Mental Status (BIMS) Repetition of Three Words (First Attempt): 3 Temporal Orientation: Year: Correct Temporal Orientation: Month: Accurate within 5 days Temporal Orientation: Day: Correct Recall: Sock: No, could not recall Recall: Blue: Yes, no cue required Recall: Bed: Yes, no cue required BIMS Summary Score: 13 Sensation Sensation Light Touch: Appears Intact Hot/Cold: Appears Intact Proprioception: Appears Intact Stereognosis: Not tested Coordination Gross Motor Movements are Fluid and Coordinated: No Fine Motor Movements are Fluid and Coordinated: No Motor  Motor Motor: Hemiplegia Motor - Discharge Observations: R sided hemi Mobility  Bed Mobility Bed Mobility: Supine to Sit;Sit to Supine Supine to Sit: Independent with assistive device Sit to Supine: Independent with assistive device Transfers Sit to Stand: Supervision/Verbal cueing Stand to Sit: Supervision/Verbal cueing  Trunk/Postural Assessment  Cervical Assessment Cervical Assessment: Within Functional Limits Thoracic Assessment Thoracic Assessment: Within Functional Limits Lumbar Assessment Lumbar Assessment: Within Functional Limits Postural Control Postural Control: Deficits on evaluation (delayed)  Balance Static Sitting Balance Static Sitting - Balance Support: Feet supported Static Sitting - Level of Assistance: 7: Independent Dynamic Sitting Balance Dynamic Sitting - Balance Support: During functional activity Dynamic  Sitting - Level of Assistance: 6: Modified independent (Device/Increase time) Static Standing Balance Static Standing - Balance Support: During functional activity;Left upper extremity supported Static Standing - Level of Assistance: 6: Modified independent (Device/Increase time) Dynamic Standing Balance Dynamic Standing - Balance Support: During functional activity;No upper extremity supported Dynamic Standing - Level of Assistance: 5: Stand by assistance (supervision) Extremity/Trunk Assessment RUE Assessment RUE Assessment: Exceptions to [facility] Passive Range of Motion (PROM) Comments: WFL for PROM, trace of  tone  noted in R elbow Active Range of Motion (AROM) Comments: 1/5 in elbow RUE Body System: Neuro Brunstrum levels for arm and hand: Arm;Hand Brunstrum level for arm: Stage II Synergy is developing Brunstrum level for hand: Stage II Synergy is developing LUE Assessment LUE Assessment: Within Functional Limits   Prepared by: Ronal Mallie Needy 04/11/2024, 8:27 AM  Signed by: Nereida Habermann, OTR/L, MSOT 04/12/2024

## 2024-04-12 ENCOUNTER — Other Ambulatory Visit (HOSPITAL_COMMUNITY): Payer: Self-pay

## 2024-04-12 NOTE — Plan of Care (Signed)
" °  Problem: RH Expression Communication Goal: LTG Patient will increase speech intelligibility (SLP) Description: LTG: Patient will increase speech intelligibility at word/phrase/conversation level with cues, % of the time (SLP) Outcome: Not Met (add Reason)   Problem: RH Problem Solving Goal: LTG Patient will demonstrate problem solving for (SLP) Description: LTG:  Patient will demonstrate problem solving for basic/complex daily situations with cues  (SLP) Outcome: Completed/Met   Problem: RH Expression Communication Goal: LTG Patient will verbally express basic/complex needs(SLP) Description: LTG:  Patient will verbally express basic/complex needs, wants or ideas with cues  (SLP) Outcome: Completed/Met   "

## 2024-04-12 NOTE — Progress Notes (Signed)
 Inpatient Rehabilitation Care Coordinator Discharge Note   Patient Details  Name: Isaac Meza MRN: [id number] Date of Birth: [birth year]   Discharge location: D/c to home  Length of Stay: 14 days  Discharge activity level: Min Asst  Home/community participation: Limited  Patient response un:Yzjouy Literacy - How often do you need to have someone help you when you read instructions, pamphlets, or other written material from your doctor or pharmacy?: Never  Patient response un:Dnrpjo Isolation - How often do you feel lonely or isolated from those around you?: Never  Services provided included: MD, PT, OT, CM, Neuropsych, SW, Pharmacy, TR, RN, SLP, RD  Financial Services:  Financial Services Utilized: [employer] [place] [employer]  Choices offered to/list presented to: patient and wife  Follow-up services arranged:  Outpatient, DME    Outpatient Servicies: [facility] for PT/OT/SLP DME : [employer] [facility] for 3in1 BSC, w/c and TTB    Patient response to transportation need: Is the patient able to respond to transportation needs?: Yes In the past 12 months, has lack of transportation kept you from medical appointments or from getting medications?: No In the past 12 months, has lack of transportation kept you from meetings, work, or from getting things needed for daily living?: No   Patient/Family verbalized understanding of follow-up arrangements:  Yes  Individual responsible for coordination of the follow-up plan: contact pt wife  Confirmed correct DME delivered: Graeme DELENA Jude 04/12/2024    Comments (or additional information):fam edu completed  Summary of Stay    Date/Time Discharge Planning CSW  04/04/24 0918 Pt will discharge to home with wife who works and her parents are there during the day to assist while wife is working, will have 24/7 care. PCS referral faxed to [facility] DEpt with insurance to assit with aide support at home. Will  confirm there are no barriers to discharge. AAC  03/29/24 0853 New evaluation today-home with wife who works and her parents are there during the day to assist while wife is working, will have 24/7 care BGD       Graeme DELENA Jude

## 2024-04-12 NOTE — Plan of Care (Signed)
" °  Problem: RH Bathing Goal: LTG Patient will bathe all body parts with assist levels (OT) Description: LTG: Patient will bathe all body parts with assist levels (OT) Outcome: Completed/Met   Problem: RH Dressing Goal: LTG Patient will perform upper body dressing (OT) Description: LTG Patient will perform upper body dressing with assist, with/without cues (OT). Outcome: Completed/Met   Problem: RH Balance Goal: LTG Patient will maintain dynamic standing with ADLs (OT) Description: LTG:  Patient will maintain dynamic standing balance with assist during activities of daily living (OT)  Outcome: Adequate for Discharge   Problem: Sit to Stand Goal: LTG:  Patient will perform sit to stand in prep for activites of daily living with assistance level (OT) Description: LTG:  Patient will perform sit to stand in prep for activites of daily living with assistance level (OT) Outcome: Adequate for Discharge   Problem: RH Dressing Goal: LTG Patient will perform lower body dressing w/assist (OT) Description: LTG: Patient will perform lower body dressing with assist, with/without cues in positioning using equipment (OT) Outcome: Adequate for Discharge   Problem: RH Toileting Goal: LTG Patient will perform toileting task (3/3 steps) with assistance level (OT) Description: LTG: Patient will perform toileting task (3/3 steps) with assistance level (OT)  Outcome: Adequate for Discharge   Problem: RH Functional Use of Upper Extremity Goal: LTG Patient will use RT/LT upper extremity as a (OT) Description: LTG: Patient will use right/left upper extremity as a stabilizer/gross assist/diminished/nondominant/dominant level with assist, with/without cues during functional activity (OT) Outcome: Adequate for Discharge   Problem: RH Toilet Transfers Goal: LTG Patient will perform toilet transfers w/assist (OT) Description: LTG: Patient will perform toilet transfers with assist, with/without cues using  equipment (OT) Outcome: Adequate for Discharge   Problem: RH Tub/Shower Transfers Goal: LTG Patient will perform tub/shower transfers w/assist (OT) Description: LTG: Patient will perform tub/shower transfers with assist, with/without cues using equipment (OT) Outcome: Adequate for Discharge   "

## 2024-04-12 NOTE — Progress Notes (Signed)
 "                                                        PROGRESS NOTE   Subjective/Complaints:  No events overnight. No acute complaints. Vitals stable   No concerns regarding discharge today  ROS: Denies fevers, chills, N/V, abdominal pain, constipation, diarrhea, SOB, cough, chest pain, new weakness or paraesthesias.   + Right sided spasticity  Objective:   No results found.  Recent Labs    04/11/24 0600  WBC 11.2*  HGB 11.5*  HCT 33.5*  PLT 440*    Recent Labs    04/11/24 0600  NA 137  K 4.5  CL 100  CO2 28  GLUCOSE 110*  BUN 7  CREATININE 0.81  CALCIUM  9.1     Intake/Output Summary (Last 24 hours) at 04/12/2024 0923 Last data filed at 04/12/2024 0800 Gross per 24 hour  Intake 920 ml  Output 1850 ml  Net -930 ml     Physical Exam: Vital Signs Blood pressure 116/69, pulse 76, temperature 98.7 F (37.1 C), temperature source Oral, resp. rate 18, height 5' 8 (1.727 m), weight 54.5 kg, SpO2 98%.  General: No acute distress.  Sitting up in bed  Mood and affect are appropriate, though a little flat Heart: Regular rate and rhythm no rubs murmurs or extra sounds Lungs: CTAB no w/r/r, no cough during exam Abdomen: Positive bowel sounds, soft nontender to palpation, nondistended Extremities: No clubbing, cyanosis, or edema Skin: No evidence of breakdown, no evidence of rash over exposed surfaces + Bruising over right hip, flank-resolving  Neurologic:  Awake, alert, and oriented x 3.  Cognition intact. Cranial nerves II through XII intact motor strength is 5/5 in left  1+/5 RUE SA, tr/5 EE, 0/5 EF, 0/5 WE, 0/5 FA, 2/5 FF 2/5 RLE HF, 2/5 KE, and 0/5 distal   Sensory exam normal sensation to light touch  in bilateral upper and lower extremities No ataxia.  Tone: MAS 2 right knee flexor and MAS 1+ right finger flexors, MAS 2 wrist flexor,  MAS 2 R elbow  Musculoskeletal:  No apparent deformity   Assessment/Plan: 1. Functional deficits which require 3+  hours per day of interdisciplinary therapy in a comprehensive inpatient rehab setting. Physiatrist is providing close team supervision and 24 hour management of active medical problems listed below. Physiatrist and rehab team continue to assess barriers to discharge/monitor patient progress toward functional and medical goals  Care Tool:  Bathing    Body parts bathed by patient: Chest, Right arm, Abdomen, Front perineal area, Right upper leg, Left upper leg, Right lower leg, Left lower leg, Face, Buttocks, Left arm   Body parts bathed by helper: Left arm     Bathing assist Assist Level: Supervision/Verbal cueing     Upper Body Dressing/Undressing Upper body dressing   What is the patient wearing?: Pull over shirt    Upper body assist Assist Level: Supervision/Verbal cueing    Lower Body Dressing/Undressing Lower body dressing      What is the patient wearing?: Pants, Underwear/pull up     Lower body assist Assist for lower body dressing: Minimal Assistance - Patient > 75%     Toileting Toileting    Toileting assist Assist for toileting: Minimal Assistance - Patient > 75%     Transfers Chair/bed  transfer  Transfers assist     Chair/bed transfer assist level: Supervision/Verbal cueing     Locomotion Ambulation   Ambulation assist      Assist level: Contact Guard/Touching assist Assistive device:  (hurrycane) Max distance: 150'   Walk 10 feet activity   Assist     Assist level: Contact Guard/Touching assist Assistive device:  (hurrycane)   Walk 50 feet activity   Assist Walk 50 feet with 2 turns activity did not occur: Safety/medical concerns  Assist level: Contact Guard/Touching assist Assistive device:  (hurrycane)    Walk 150 feet activity   Assist Walk 150 feet activity did not occur: Safety/medical concerns  Assist level: Contact Guard/Touching assist Assistive device:  (hurrycane)    Walk 10 feet on uneven surface   activity   Assist Walk 10 feet on uneven surfaces activity did not occur: Safety/medical concerns   Assist level: Contact Guard/Touching assist Assistive device:  (hurrycane)   Wheelchair     Assist Is the patient using a wheelchair?: No Type of Wheelchair: Manual    Wheelchair assist level: Dependent - Patient 0% Max wheelchair distance: 150'    Wheelchair 50 feet with 2 turns activity    Assist        Assist Level: Dependent - Patient 0%   Wheelchair 150 feet activity     Assist      Assist Level: Dependent - Patient 0%   Blood pressure 116/69, pulse 76, temperature 98.7 F (37.1 C), temperature source Oral, resp. rate 18, height 5' 8 (1.727 m), weight 54.5 kg, SpO2 98%.  Medical Problem List and Plan: 1. 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 A little impulsive/quick to move, but overall did well with PT.    - 12/29: orthotics AFO consult--12/31 afternoon  - 12/30: Home with wife and parents to assist while she is at work. CGA SPT Min A UE with some trace movement of distal R. PT SPV bed, CGA transfers, walked 100 ft +1 assist Min A yesterday. Family ed Friday. Tolerating regular diet. 75% intelligable. Bsc and TTB needed for home.   - stable for DC tomorrow, will need ENT referral for vocal fold eval  The patient is medically ready for discharge to home and will need follow-up with [facility]. In addition, they will need to follow up with their PCP, Neurology.    2.  Antithrombotics: -DVT/anticoagulation:  Mechanical: Antiembolism stockings, thigh (TED hose) Bilateral lower extremities -antiplatelet therapy: Aspirin  81 mg daily and Brilinta  90 mg twice daily x 4 weeks then aspirin  alone - 12/26: Right lower  extremity duplex due to mild leukocytosis, pain and range of motion deficit. Neg for DVT 12/27  3. Pain Management: Oxycodone  5 mg every 6 hours as needed 12/24: Add Tylenol  650 mg Q6H PRN for mild to moderate pain, baclofen  5 mg TID PRN for spasms.  Lidoderm  patch over right hip for GTB pain. 12/25: Pain continues, not using PRNs, continue current management; improved  4. Mood/Behavior/Sleep: Provide emotional support             -antipsychotic agents: N/A 5. Neuropsych/cognition: This patient is capable of making decisions on his own  behalf. 6. Skin/Wound Care: Routine skin checks  - Monitor diffuse bruising on right flank, stable   - 12/25: DC IV  7. Fluids/Electrolytes/Nutrition: Routine in and outs with follow-up chemistries, continue vitamins/supplements  - Admission labs stable; repeat 12/26, 1/1--stable  8.  Hyperlipidemia.  Crestor  20mg  daily  9.  History of alcohol as well as tobacco use.  Continue Nicorette  gum prn.  Latest alcohol level 155 03/17/2024.  Provide counseling  10.  Hypertension.  Home medications amlodipine  5 mg daily, lisinopril  20 mg daily.  Goal BP less than 220/110.  Per discharge, gradually resume antihypertensive over next 2 weeks based on BP. 12/23: Monitor with therapies today, will resume lisinopril  at low-dose 5 mg in a.m.  - normotensive - monitor  Vitals:   04/08/24 1952 04/09/24 0524 04/09/24 0822 04/09/24 1359  BP: 135/82 129/70 129/70 126/83   04/09/24 2020 04/10/24 0455 04/10/24 1749 04/10/24 1949  BP: 121/75 (!) 101/59 134/87 129/84   04/11/24 0536 04/11/24 1537 04/11/24 1959 04/12/24 0518  BP: 113/68 107/80 131/85 116/69     11.  Anemia.  Hemoglobin 12.1->10.7 on admission.  FOBT, H&H in AM.    - Continue aspirin  and Brilinta  for now 12/24: h/h stable 10.7; FOBT pending 12-26 hemoglobin stable, 11--> 10.7 12/27  12.  Thrombocytosis.  Likely reactive, trend.  - Staying in the 400s; monitor  13. Bowel incontinence/patient. Timed  toiletting.   - Last bowel movement 12-23: Start Colace 100 mg twice daily   - 12/26: add sennakot 8.6mg  nightly   - LBM 04/09/24  14.  Leukocytosis.  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   16.  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  Increase baclofen  to 10 mg 3 times daily.  Discussed possible Botox end of this week, patient is agreeable. 1/2: Doing very well will baclofen  increase, using knee spasicity for ambulation so will re-assess need for injections Monday--stable, will follow up as as OP  LOS: 15 days A FACE TO FACE EVALUATION WAS PERFORMED  Joesph JAYSON Likes 04/12/2024, 9:23 AM     "

## 2024-04-18 ENCOUNTER — Ambulatory Visit: Attending: Physician Assistant

## 2024-04-18 ENCOUNTER — Ambulatory Visit: Admitting: Occupational Therapy

## 2024-04-18 ENCOUNTER — Ambulatory Visit

## 2024-04-18 DIAGNOSIS — R262 Difficulty in walking, not elsewhere classified: Secondary | ICD-10-CM | POA: Insufficient documentation

## 2024-04-18 DIAGNOSIS — I639 Cerebral infarction, unspecified: Secondary | ICD-10-CM | POA: Insufficient documentation

## 2024-04-18 DIAGNOSIS — M6281 Muscle weakness (generalized): Secondary | ICD-10-CM

## 2024-04-18 DIAGNOSIS — R278 Other lack of coordination: Secondary | ICD-10-CM | POA: Insufficient documentation

## 2024-04-18 DIAGNOSIS — R471 Dysarthria and anarthria: Secondary | ICD-10-CM | POA: Insufficient documentation

## 2024-04-18 DIAGNOSIS — I69351 Hemiplegia and hemiparesis following cerebral infarction affecting right dominant side: Secondary | ICD-10-CM | POA: Insufficient documentation

## 2024-04-18 DIAGNOSIS — R2681 Unsteadiness on feet: Secondary | ICD-10-CM | POA: Insufficient documentation

## 2024-04-18 DIAGNOSIS — R41841 Cognitive communication deficit: Secondary | ICD-10-CM | POA: Insufficient documentation

## 2024-04-18 NOTE — Therapy (Signed)
 " OUTPATIENT OCCUPATIONAL THERAPY NEURO EVALUATION  Patient Name: Isaac Meza MRN: [id number] DOB:20-Sep-1970, 54 y.o., male Today's Date: 04/18/2024  PCP: [name], DO REFERRING PROVIDER:  Pegge Toribio PARAS, PA-C  END OF SESSION:  OT End of Session - 04/18/24 0933     Visit Number 1    Number of Visits 24    Date for Recertification  07/11/24    OT Start Time 0930    OT Stop Time 1015    OT Time Calculation (min) 45 min    Activity Tolerance Patient tolerated treatment well    Behavior During Therapy [facility] for tasks assessed/performed;Flat affect          Past Medical History:  Diagnosis Date   Asthma    childhood   Hypertension    Past Surgical History:  Procedure Laterality Date   APPENDECTOMY  2024   BACK SURGERY     ESOPHAGOGASTRODUODENOSCOPY (EGD) WITH PROPOFOL  N/A 08/01/2022   Procedure: ESOPHAGOGASTRODUODENOSCOPY (EGD) WITH PROPOFOL ;  Surgeon: Rollin Dover, MD;  Location: [facility] ENDOSCOPY;  Service: Gastroenterology;  Laterality: N/A;   EUS N/A 08/01/2022   Procedure: UPPER ENDOSCOPIC ULTRASOUND (EUS) LINEAR;  Surgeon: Rollin Dover, MD;  Location: [facility] ENDOSCOPY;  Service: Gastroenterology;  Laterality: N/A;   FINE NEEDLE ASPIRATION  08/01/2022   Procedure: FINE NEEDLE ASPIRATION (FNA) LINEAR;  Surgeon: Rollin Dover, MD;  Location: [facility] ENDOSCOPY;  Service: Gastroenterology;;   FRACTURE SURGERY Right 2007   arm   XI ROBOTIC LAPAROSCOPIC ASSISTED APPENDECTOMY N/A 05/16/2022   Procedure: XI ROBOTIC LAPAROSCOPIC ASSISTED APPENDECTOMY;  Surgeon: Desiderio Schanz, MD;  Location: [facility] ORS;  Service: General;  Laterality: N/A;   Patient Active Problem List   Diagnosis Date Noted   Infarction of left basal ganglia (HCC) 03/28/2024   CVA (cerebral vascular accident) (HCC) 03/17/2024   Stroke (HCC) 03/17/2024   Epigastric abdominal pain 12/06/2022   Hypertensive urgency 12/06/2022   Alcohol use disorder 12/06/2022   Erosive gastropathy 12/06/2022    Erythrocytosis 12/06/2022   Protein-calorie malnutrition, severe 08/01/2022   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   Abnormal CT of the abdomen 07/30/2022   Pancreatic cyst 07/30/2022   Nausea and vomiting 07/30/2022   Chronic pancreatitis (HCC) 07/29/2022   Dehydration 07/29/2022   Duodenal obstruction 07/27/2022   Acute appendicitis 05/16/2022   Essential hypertension 04/30/2022   Long term current use of opiate analgesic 09/12/2020   Bilateral thoracic back pain 10/13/2017    ONSET DATE: 03/17/2025  REFERRING DIAG: CVA  THERAPY DIAG:  Muscle weakness (generalized)  Rationale for Evaluation and Treatment: Rehabilitation  SUBJECTIVE:   SUBJECTIVE STATEMENT: Pt. Reports being independent  prior to onset of the CVA, and was working several jobs Pt accompanied by: significant other: wife Dawn  PERTINENT HISTORY: PMHx per  inpatient chart review: Admit date: 03/28/2024 Discharge date: 04/12/2024  Discharge Diagnoses:  Principal Problem:   Infarction of left basal ganglia (HCC) Hyperlipidemia History of alcohol use History of tobacco use Hypertension  Brief HPI:   Isaac Meza is a 54 y.o. right-handed male with history of childhood asthma, hypertension, tobacco and alcohol use.  Per chart review lives with spouse as well as in-laws.  Wife works during the day.  Two-level home bed and bath main level.  Independent prior to admission and working.  Presented 03/17/2024 with right sided weakness and slurred speech with ataxic gait x 2 days.  MRI of the brain showed patchy acute small vessel ischemic type infarcts in the left corona radiata tracking towards the posterior left lentiform.  Echocardiogram with ejection fraction of 60 to 65% no PFO identified.  CT angiogram with no stenosis noted.  Patient was placed on aspirin  81 mg daily and Plavix  75 mg daily for CVA prophylaxis x 3 weeks then aspirin  alone was discharged to home 03/19/2024.  Patient was doing well until the morning of  03/23/2024 with increasing weakness right side and worsening slurred speech.  Repeat CT/MRI showed interval expansion and worsening and recently identified posterior left basal ganglia infarction now measuring up to 2.9 cm in size.  No associated hemorrhage or significant regional mass effect.  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  Neurology follow-up recommendations were for aspirin  81 mg daily and Brilinta  90 mg twice daily x 4 weeks then aspirin  alone.  Tolerating a regular diet.  Therapy evaluations completed due to patient's decreased functional mobility and right side weakness was admitted for a comprehensive rehab program.  Hospital Course: Isaac Meza was admitted to rehab 03/28/2024 for inpatient therapies to consist of PT, ST and OT at least three hours five days a week. Past admission physiatrist, therapy team and rehab RN have worked together to provide customized collaborative inpatient rehab.  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  Follow-up neurology services.  Currently maintained on aspirin  81 mg daily and Brilinta  90 mg twice daily x 4 weeks then aspirin  alone.  Pain management with very minimal use of oxycodone  as well as the addition of baclofen  as needed for muscle spasms.  History of hypertension patient on Norvasc  5 mg daily and lisinopril  20 mg daily prior to admission and held initially for permissive hypertension and would need follow-up with PCP with low-dose lisinopril  resumed.  Crestor  ongoing for hyperlipidemia.  History of alcohol as well as tobacco use.  Maintained on Nicorette  gum.  Latest alcohol level 155 03/17/2024.  Patient received counts regards cessation of alcohol as well as tobacco products.  PRECAUTIONS: None  WEIGHT BEARING RESTRICTIONS: No  PAIN:  Are you having pain? 8/10 right  rib  FALLS: Has patient fallen in last 6 months? Yes. Number of falls 4  LIVING ENVIRONMENT: Lives with: lives with their spouse Lives in: House/apartment Stairs: 2 stairs to enter, 2 levels -does not use 2nd level Has following equipment at home: Quad cane small base, Environmental Consultant - 2 wheeled, and Wheelchair (manual), shower chair,   PLOF: Independent  PATIENT GOALS: To get everything back  OBJECTIVE:  Note: Objective measures were completed at Evaluation unless otherwise noted.  HAND DOMINANCE: Right  ADLs: * Pt. uses the nondominant left hand for all  ADL/IADL tasks Transfers/ambulation related to ADLs: Eating: Independent using the left hand Grooming:  Inpendent using the left hand UB Dressing: Increased time LB Dressing: Has difficulty with shoes, socks, pants Toileting: Wife assists with toilet transfers, Independent with toilet hygiene care Bathing: MinA with transfers using transfer tub bench, independent bathing  IADLs: Shopping: Wife does grocery shopping Light housekeeping: Has difficulty Meal Prep: Difficulty with house  logistics Community mobility: Wife is driving Medication management: Wife assists with weekly set-up, Pt. 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  POSTURE COMMENTS:  No Significant postural limitations  ACTIVITY TOLERANCE: Activity tolerance: Fair  FUNCTIONAL OUTCOME MEASURES: TBD  UPPER EXTREMITY ROM:    Active ROM Right eval Left Eval [facility]  Shoulder flexion 0(116)   Shoulder abduction 0(96)   Shoulder adduction    Shoulder extension    Shoulder internal rotation    Shoulder external rotation    Elbow flexion 0(140)   Elbow extension 0(0)   Wrist flexion 0(70)   Wrist extension 0(44)    Wrist ulnar deviation    Wrist radial deviation    Wrist pronation    Wrist supination    (Blank rows = not tested)  Eval:  -Active right scapular elevation -Limited scapular Ab/adduction -Active initiation of very minimal movement in the right thumb IP flexion, extension, MP flexion, and digit flexion   UPPER EXTREMITY MMT:     MMT Right eval Left Eval [facility]  Shoulder flexion 0/5   Shoulder abduction 0/5   Shoulder adduction    Shoulder extension    Shoulder internal rotation    Shoulder external rotation    Middle trapezius    Lower trapezius    Elbow flexion 0/5   Elbow extension 0/5   Wrist flexion 0/5   Wrist extension 0/5   Wrist ulnar deviation    Wrist radial deviation    Wrist pronation    Wrist supination    (Blank rows = not tested)  HAND FUNCTION:  Eval: N/A Pt. Is unable  COORDINATION: Eval: N/A Pt. Is unable  SENSATION: Light touch: WFL Proprioception: WFL  EDEMA: N/A  MUSCLE TONE: Decreased tone in the RUE  COGNITION: Overall cognitive status: Within functional limits for tasks assessed  VISION:  No vision changes  PRAXIS: Impaired                                                                                                                  TREATMENT DATE: 04/18/24   OT initial evaluation was completed, and Pt. education was provided as indicated below.   PATIENT EDUCATION: Education details: OT services, POC, goals and ADL/IADL functional Status.  Person educated: Patient and Spouse Education method: Explanation, Tactile cues, Verbal cues, and Handouts Education comprehension: verbalized understanding, returned demonstration, verbal cues required, tactile cues required, and needs further education  HOME EXERCISE PROGRAM:  Continue to assess ongoing need for HEPs, and provide/upgrade as indicated.    GOALS: Goals reviewed with patient? Yes  SHORT TERM GOALS: Target date: 05/30/2024   Pt.will demonstrate independence with  HEPs for the RUE Baseline: Eval: No current HEP Goal status: INITIAL   LONG TERM GOALS: Target date: 07/11/2024  Pt. Will improve AROM for shoulder flexion by 20 degrees in preparation for functional reaching. Baseline: Eval: Right shoulder flexion: 0(116) Goal status: INITIAL  2.  Pt. Will improve AROM for Right shoulder abduction by 20 degrees in preparation for assisting with haircare. Baseline: Eval: Right shoulder abduction: 0(96) Goal status: INITIAL  3.  Pt. Will improve right elbow flexion bu 10 degrees in preparation for perfroming hand to mouth  patterns of movement. Baseline: Eval: Right elbow flexion: 0(140) Goal status: INITIAL  4.  Pt. Will consistently initiate right elbow extension in preparation for reaching out for items on a table. Baseline: Eval: No active initiation of elbow extension Goal status: INITIAL  5.  Pt.will improve right wrist extension by 10 degrees in preparation for initiating grasping for items. Baseline: Eval: Wrist extension 0(44) Goal status: INITIAL  6.  Pt. Will independently be able to extend the digits on his right hand to be able to assume a position of weightbearing/proprioception. Baseline: Eval: Very minimal Active initiation of movement in the right thumb IP Flexion, extension, MP flexion, and digit flexion. Goal status: INITIAL  ASSESSMENT:  CLINICAL IMPRESSION: Patient is a 54 y.o. male who was seen today for occupational therapy evaluation for a CVA. Pt. presents with 8/10 rib pain, RUE weakness with no activation of active movement proximally, very minimal activation of digit movement, and a history of falls which limit his ability to engage his dominant RUE during daily basic ADL, and IADL care tasks. Pt. Currently uses his nondominant LUE for all ADL, and IADL tasks. Pt. will benefit from OT services to work on facilitating active volitional movement n order to improve overall RUE functioning to increase engagement in, and maximize  independence with ADLS, and IADL tasks.  PERFORMANCE DEFICITS: in functional skills including ADLs, IADLs, coordination, ROM, strength, pain, Fine motor control, Gross motor control, and UE functional use, cognitive skills including, and psychosocial skills including coping strategies, environmental adaptation, interpersonal interactions, and routines and behaviors.   IMPAIRMENTS: are limiting patient from ADLs, IADLs, and leisure.   CO-MORBIDITIES: may have co-morbidities  that affects occupational performance. Patient will benefit from skilled OT to address above impairments and improve overall function.  MODIFICATION OR ASSISTANCE TO COMPLETE EVALUATION: Min-Moderate modification of tasks or assist with assess necessary to complete an evaluation.  OT OCCUPATIONAL PROFILE AND HISTORY: Detailed assessment: Review of records and additional review of physical, cognitive, psychosocial history related to current functional performance.  CLINICAL DECISION MAKING: Moderate - several treatment options, min-mod task modification necessary  REHAB POTENTIAL: Good  EVALUATION COMPLEXITY: Moderate    PLAN:  OT FREQUENCY: 2x/week  OT DURATION: 12 weeks  PLANNED INTERVENTIONS: 97168 OT Re-evaluation, 97535 self care/ADL training, 02889 therapeutic exercise, 97530 therapeutic activity, 97112 neuromuscular re-education, 97140 manual therapy, 97018 paraffin, 02989 moist heat, 97010 cryotherapy, 97034 contrast bath, 97760 Orthotic Initial, 97763 Orthotic/Prosthetic subsequent, energy conservation, patient/family education, and DME and/or AE instructions  RECOMMENDED OTHER SERVICES: PT, and ST  CONSULTED AND AGREED WITH PLAN OF CARE: family member/caregiver  PLAN FOR NEXT SESSION: Treatment  Richardson Otter, MS, OTR/L  04/18/2024, 9:39 AM           "

## 2024-04-18 NOTE — Therapy (Signed)
 " OUTPATIENT PHYSICAL THERAPY EVALUATION   Patient Name: Isaac Meza MRN: [id number] DOB:February 07, 1971, 54 y.o., male Today's Date: 04/18/2024  PCP: [name], DO  REFERRING PROVIDER: Pegge Toribio PARAS, PA-C  END OF SESSION:  PT End of Session - 04/18/24 0809     Visit Number 1    Number of Visits 24    Date for Recertification  07/11/24    Authorization Type [place] [employer] (cert for 8/87/73-08/08/71)    Authorization Time Period Authorization pending    Authorization - Visit Number 0    Progress Note Due on Visit 10    PT Start Time 0845    PT Stop Time 0925    PT Time Calculation (min) 40 min    Equipment Utilized During Treatment Gait belt    Activity Tolerance Patient tolerated treatment well;No increased pain    Behavior During Therapy [facility] for tasks assessed/performed;Flat affect          Past Medical History:  Diagnosis Date   Asthma    childhood   Hypertension    Past Surgical History:  Procedure Laterality Date   APPENDECTOMY  2024   BACK SURGERY     ESOPHAGOGASTRODUODENOSCOPY (EGD) WITH PROPOFOL  N/A 08/01/2022   Procedure: ESOPHAGOGASTRODUODENOSCOPY (EGD) WITH PROPOFOL ;  Surgeon: Rollin Dover, MD;  Location: [facility] ENDOSCOPY;  Service: Gastroenterology;  Laterality: N/A;   EUS N/A 08/01/2022   Procedure: UPPER ENDOSCOPIC ULTRASOUND (EUS) LINEAR;  Surgeon: Rollin Dover, MD;  Location: [facility] ENDOSCOPY;  Service: Gastroenterology;  Laterality: N/A;   FINE NEEDLE ASPIRATION  08/01/2022   Procedure: FINE NEEDLE ASPIRATION (FNA) LINEAR;  Surgeon: Rollin Dover, MD;  Location: [facility] ENDOSCOPY;  Service: Gastroenterology;;   FRACTURE SURGERY Right 2007   arm   XI ROBOTIC LAPAROSCOPIC ASSISTED APPENDECTOMY N/A 05/16/2022   Procedure: XI ROBOTIC LAPAROSCOPIC ASSISTED APPENDECTOMY;  Surgeon: Desiderio Schanz, MD;  Location: [facility] ORS;  Service: General;  Laterality: N/A;   Patient Active Problem List   Diagnosis Date Noted   Infarction of left basal ganglia  (HCC) 03/28/2024   CVA (cerebral vascular accident) (HCC) 03/17/2024   Stroke (HCC) 03/17/2024   Epigastric abdominal pain 12/06/2022   Hypertensive urgency 12/06/2022   Alcohol use disorder 12/06/2022   Erosive gastropathy 12/06/2022   Erythrocytosis 12/06/2022   Protein-calorie malnutrition, severe 08/01/2022   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   Abnormal CT of the abdomen 07/30/2022   Pancreatic cyst 07/30/2022   Nausea and vomiting 07/30/2022   Chronic pancreatitis (HCC) 07/29/2022   Dehydration 07/29/2022   Duodenal obstruction 07/27/2022   Acute appendicitis 05/16/2022   Essential hypertension 04/30/2022   Long term current use of opiate analgesic 09/12/2020   Bilateral thoracic back pain 10/13/2017    ONSET DATE: 03/24/24  REFERRING DIAG: Left basal ganglia embolic stroke   THERAPY DIAG:  Hemiplegia and hemiparesis following cerebral infarction affecting right dominant side (HCC)  Difficulty in walking, not elsewhere classified  Unsteadiness on feet  Rationale for Evaluation and Treatment: Rehabilitation  SUBJECTIVE:  SUBJECTIVE STATEMENT: Pt arrives from SLP evaluation. Pt has been home from rehab x1 week, repots successful transition overall.   Pt accompanied by: Wife  PERTINENT HISTORY:  Isaac Meza is a 53yoM who comes [facility] on 03/17[facility]24 c Rt hemiweakness and slurred speech, 2 falls at home while going to BR. PMH: HTN, heavy ETOH use. Workup at that time reveal acute infarct, however symptom largely resolved by time of PT evaluation. Pt back to [facility] ED on 03/24/24 with Rt side weakness, swallowing difficulty, MRI revealing of evolution of Lt corona radiata infarct. Pt DC to inpatient rehab setting 03/29/24-04/12/24, DC with rehab DME reqs for hurrycane and 16x16 manual WC, Pt  able to AMB minA for ~12ft. At evaluation time here, pt is practicing waling QD with wife, AFO, SPC, otherwise uses WC to navigate the home most of day.  PAIN:  Are you having pain? Rt lateral upper flank rib area pain from fall.   PRECAUTIONS: Fall  WEIGHT BEARING RESTRICTIONS: No  FALLS: Has patient fallen in last 6 months? 1 fall since being home on morning of 1/12 trying to get out of bed on his own.   LIVING ENVIRONMENT: Lives with: Wife, MIL, FIL Lives in: [place], 2 story, able to live on main level Stairs: 2, no railing Has following equipment at home: DC to home from CIR with AFO, Hurrycane, and RW with hand splint (which he is unable to use)   PLOF:  Fully independent, working 2 physically demanding jobs.   PATIENT GOALS: regain as much baseline function as possible   OBJECTIVE:  Note: Objective measures were completed at Evaluation unless otherwise noted.  DIAGNOSTIC FINDINGS:  MRI 03/23/24: IMPRESSION: 1. Interval expansion and worsening in recently identified posterior left basal ganglia infarct, now measuring up to 2.9 cm in size. No associated hemorrhage or significant regional mass effect. 2. No other new intracranial abnormality. 3. Underlying mild chronic microvascular ischemic disease with remote lacunar infarcts about the bilateral basal ganglia and thalami. 4. Chronic left ethmoidal and left maxillary sinusitis, left OMU obstructive pattern.     Electronically Signed   By: Morene Hoard M.D.   On: 03/23/2024 19:15  COGNITION: Overall cognitive status: WFL (see SLP evlauation for more detail)   BED MOBILITY:  -requires minA for in/out of bed at home;   TRANSFERS: -10x STS, WC push off: 54.89sec   STAIRS:  GAIT: -3 point gait with Left lateral lean, heavy support on device, AFO in place with mild, intermittent Rt toe scuff audible, Rt toe out >30 degrees.   FUNCTIONAL TESTS:  -10x STS, WC push off: 54.89sec   PATIENT SURVEYS:                                                                                                                                 TREATMENT DATE 04/18/2024: -10x STS, WC push off: 54.89sec  -116ft AMB overground, SPC: 29m 34sec, minguardA walking, modA for  LOB recovery  *3 minutes recovery -13ft AMB overground, Left HW, 91m 24sec minGuardA, no LOB *3 minutes recovery -161ft AMB overground, Left L[place]strand crutch, 1m34sec minGuardA, a few LOB, but mostly due to catching toe on things due to degree of toe out bilat + striding out more.   *3 minutes recovery -AMB overground backwards 2x84ft (brief break between), fatigue more apparent over final 84ft (more LOB, more labored stepping)   PATIENT EDUCATION: Education details: Continued to educate on different methods for gait and transfers performance.  Person educated: Patient, spouse  Education method: collaborative learning, deliberate practice, positive reinforcement, explicit instruction, establish rules. Education comprehension: Good   HOME EXERCISE PROGRAM: Will defer to later sessions. Strong focus on basic ADL mobility in home with family.   GOALS: Goals reviewed with patient? No   SHORT TERM GOALS: Target date: 05/30/24  Pt to demonstrate tolerance to overground AMB >592ft without LOB at supervision level DME ad lib.  Baseline: Goal status: INITIAL  2.  Pt to demonstrate improvement in gait ergonomics AEB gait speed increase >20%.  Baseline:  Goal status: INITIAL  3.  Pt to report successful AMB in home ad lib at modified independent level without any falls for most recent 4 weeks.  Baseline:  Goal status: INITIAL  LONG TERM GOALS: Target date: 07/11/24 Pt to demonstrate tolerance to overground AMB >578ft including surface variety simulating outside navigation: variable grades, surface stypes, incline, and intermittent setp up/down, without LOB at supervision level DME ad lib.  Baseline: Goal status: INITIAL  2.  Pt to  demonstrate improvement in gait ergonomics AEB gait speed increase >40%.  Baseline:  Goal status: INITIAL  3.  Pt to demonstrate sustained standing tolerance >10 minutes for stationary functional household tasks.   Baseline:  Goal status: INITIAL   ASSESSMENT:  CLINICAL IMPRESSION: Isaac Meza is a 53yoM referred to OPPT after CVA in December 2025. Evaluation revealing of changes to Rt hemibody tone, coordination, and strength, all that are impacting pt's ability to perform bed mobility, transfers, AMB, stairs, fitness activity, and work duties, at his prior level of function. In the spirit of maintaining continuity from inpateint rehab process to outpatient setting, today's visit included jumping right back into interventions most meaningful and impactful at rehab last week. Pt AMB with SPC, HW, and LC, takes breaks as needed, we discussed pros and cons of each device. Wife encouraged to maximize time at home for walking practice. Patient will benefit from skilled physical therapy intervention to reduce deficits and impairments identified in evaluation, in order to reduce pain, improve quality of life, and maximize activity tolerance for ADL, IADL, and leisure/fitness. Physical therapy will help pt achieve long and short term goals of care.    OBJECTIVE IMPAIRMENTS: Decreased knowledge of condition, decreased use of DME, decreased mobility, difficulty walking, decreased strength, decreased ROM. ACTIVITY LIMITATIONS: Lifting, standing, walking, squatting, transfers, locomotion level PARTICIPATION LIMITATIONS: Cleaning, laundry, interpersonal relationships, driving, yardwork, community activity.  PERSONAL FACTORS: Age, behavior pattern, education, past/current experiences, transportation, profession  are also affecting patient's functional outcome.  REHAB POTENTIAL: Good CLINICAL DECISION MAKING: Medium  EVALUATION COMPLEXITY: Moderate   PLAN:  PT FREQUENCY: 1-2x/week  PT DURATION:  12 weeks  PLANNED INTERVENTIONS: 97110-Therapeutic exercises, 97530- Therapeutic activity, 97112- Neuromuscular re-education, 97535- Self Care, [id number]- Manual therapy, [phone]- Gait training, [phone]- Orthotic/Prosthetic subsequent, [id number]- Electrical stimulation (unattended), [phone]- Electrical stimulation (manual), Patient/Family education, Balance training, Stair training, Joint mobilization, Cognitive remediation, DME instructions, Wheelchair mobility training, Cryotherapy, and Moist  heat  PLAN FOR NEXT SESSION:  -Increase gait intensity over short distance -Increase gait instability over shorter distance -more backward and lateral stepping    Aleria Maheu C, PT 04/18/2024, 8:12 AM  8:37 AM, 04/18/2024 Peggye JAYSON Linear, PT, DPT Physical Therapist - [facility]  Outpatient Physical Therapy- Main [facility] [phone]         "

## 2024-04-18 NOTE — Therapy (Signed)
 " OUTPATIENT SPEECH LANGUAGE PATHOLOGY  EVALUATION   Patient Name: Isaac Meza MRN: [id number] DOB:08-01-1970, 54 y.o., male Today's Date: 04/18/2024  PCP: Sharyle Fischer, DO REFERRING PROVIDER: Toribio Haggard, PA-C   End of Session - 04/18/24 0853     Visit Number 1    Number of Visits 24    Date for Recertification  07/11/24    SLP Start Time 0805    SLP Stop Time  0845    SLP Time Calculation (min) 40 min    Activity Tolerance Patient tolerated treatment well          Patient Active Problem List   Diagnosis Date Noted   Infarction of left basal ganglia (HCC) 03/28/2024   CVA (cerebral vascular accident) (HCC) 03/17/2024   Stroke (HCC) 03/17/2024   Epigastric abdominal pain 12/06/2022   Hypertensive urgency 12/06/2022   Alcohol use disorder 12/06/2022   Erosive gastropathy 12/06/2022   Erythrocytosis 12/06/2022   Protein-calorie malnutrition, severe 08/01/2022   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   Abnormal CT of the abdomen 07/30/2022   Pancreatic cyst 07/30/2022   Nausea and vomiting 07/30/2022   Chronic pancreatitis (HCC) 07/29/2022   Dehydration 07/29/2022   Duodenal obstruction 07/27/2022   Acute appendicitis 05/16/2022   Essential hypertension 04/30/2022   Long term current use of opiate analgesic 09/12/2020   Bilateral thoracic back pain 10/13/2017    ONSET DATE: 03/17/24   REFERRING DIAG: I63.9 (ICD-10-CM) - Left basal ganglia embolic stroke (HCC)   THERAPY DIAG:  Dysarthria and anarthria  Cognitive communication deficit  Rationale for Evaluation and Treatment Rehabilitation  SUBJECTIVE:   SUBJECTIVE STATEMENT: Pt alert, pleasant, and cooperative.  Pt accompanied by: self and significant other  PERTINENT HISTORY & DIAGNOSTIC FINDINGS: Isaac Meza is a 54 year old right-handed male with history of childhood asthma hypertension and tobacco/alcohol use.Presented 03/17/2024 with of right sided weakness and slurred speech with ataxic gait  x 2 days. MRI of the brain showed patchy acute small vessel ischemic type infarcts in the left corona radiata tracking towards the posterior left lentiform. Pt was discharged to home on 03/19/2024. Patient was doing well until the morning of 03/23/2024 with increasing weakness right side weakness and worsening slurred speech. Repeat CT/MRI showed interval expansion and worsening and recently identified posterior left basal ganglia infarct now measuring up to 2.9 cm in size. No associated hemorrhage or significant regional mass effect. 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  Pt was admitted to CIR on 03/28/24-04/12/24.  PAIN:  Are you having pain? No   FALLS: Has patient fallen in last 6 months?  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   PATIENT GOALS   PLOF for communication  OBJECTIVE:   COGNITIVE COMMUNICATION: Overall cognitive status: Impaired Areas of impairment:  Attention: Impaired: Selective, Comment: difficulty with symbol cancellation on  Executive function: Impaired: Error awareness, Self-correction, and Slow processing   AUDITORY COMPREHENSION: Overall auditory comprehension: Appears intact YES/NO questions:   READING COMPREHENSION: DNT  EXPRESSION: verbal  VERBAL EXPRESSION: Level of generative/spontaneous verbalization: phrase and sentence Automatic speech: name: intact and social response: intact  Repetition: Appears intact Naming: Confrontation: WFL and Divergent: 10 animals and 2 m words in 71s Pragmatics: Impaired: abnormal effect   WRITTEN EXPRESSION:  Dominant hand: right   Written expression: difficulty writing with  non-dominant hand  MOTOR SPEECH: Overall motor speech: impaired Level of impairment: all levels Respiration: clavicular breathing Phonation: hoarse and low vocal intensity Resonance: WFL Articulation: Impaired: all levels Intelligibility: Intelligibility reduced Effective technique: slow rate, increased vocal intensity, and over articulate  ORAL MOTOR EXAMINATION: Facial : Symmetry impaired: Impaired right Lingual: DNT  STANDARDIZED ASSESSMENTS:   Cognitive Linguistic Quick Test: AGE - 18 - 69   The Cognitive Linguistic Quick Test (CLQT) was administered to assess the relative status of five cognitive domains: attention, memory, language, executive functioning, and visuospatial skills. Scores from 10 tasks were used to estimate severity ratings (standardized for age groups 18-69 years and 70-89 years) for each domain, a clock drawing task, as well as an overall composite severity rating of cognition.       Task Score Criterion Cut Scores  Personal Facts 8/8 8  Symbol Cancellation 0/12 11  Confrontation Naming 10/10 10  Clock Drawing  13/13 12  Story Retelling 9/10 6  Symbol Trails 10/10 9  Generative Naming 3/9 5  Design Memory 6/6 5  Mazes  8/8 7  Design Generation 7/13 6    Cognitive Domain Composite Score Severity Rating  Attention 99/215 Moderate  Memory 173/185 WNL  Executive Function 28/40 WNL  Language 30/37 WNL  Visuospatial Skills 75/105 Mild  Clock Drawing  13/13 WNL  Composite Severity Rating  Mild           PATIENT REPORTED OUTCOME MEASURES (PROM):  The Communication Effectiveness Survey is a patient-reported outcome measure in which the patient rates their own effectiveness in different communication situations. A higher score indicates greater effectiveness.   Pt's self-rating was 19/32.   Having a conversation with a family member or friends at home. 2 Participating in conversation with strangers in a quiet place. 3 Conversing with a familiar  person over the telephone. 3 Conversing with a stranger over the telephone. 3 Being part of a conversation in a noisy environment (social gathering). 2 Speaking to a friend when you are emotionally upset or you are angry. 2 Having a conversation while traveling in a car. 2 Having a conversation with someone at a distance (across a room). 2   TODAY'S TREATMENT:  Pt and wife educated re: role of SLP, results of assessment, rationale for PROM, goals of ST, and ST POC. Both verbalized understanding/agreement.   PATIENT EDUCATION: Education details: as above  Person educated: Patient and Spouse Education method: Explanation Education comprehension: verbalized understanding and needs further education  HOME EXERCISE PROGRAM:        To be given in upcoming sessions; pt to bring EMST to next ST session    GOALS:  Goals reviewed with patient? Yes  SHORT TERM GOALS: Target date: 10 sessions  With Min A, patient will demo HEP for motor speech accurately in 4/5 opportunities.   Baseline: Goal status: INITIAL   2.  With Min A, patient will improve speech intelligibility for  phrase by controlling rate of speech, over-articulation, and increased loudness to achieve 80% intelligibility.   Baseline:  Goal status: INITIAL  3.  With Min A, patient will complete 5 sets of 5 repetitions with EMST set at training threshold x2 sessions.  Baseline:  Goal status: INITIAL  4.  Pt will utilize preferred anomia compensation with min A during structured tasks.    Baseline:  Goal status: INITIAL  5.  Given a functional problem (ie., social problem, multi-step math  word problem, logic puzzle) or scenario from daily life, patient will demonstrate error awareness and correct errors independently with at least 80% acc.   Baseline:  Goal Status: INITIAL   LONG TERM GOALS: Target date: 12 weeks  Pt will will report improved communication per PROM. Baseline: CES 19/32 04/18/24 Goal status:  INITIAL  2.  Pt will utilize preferred compensations for dysarthria and anomia to express complex information with min A.  Baseline:  Goal status: INITIAL  3.  Pt will demonstrate problem solving for complex daily situations with no more than min A.  Baseline:  Goal status: INITIAL    ASSESSMENT:  CLINICAL IMPRESSION:  Patient is a 54 y.o. male who was seen today for motor-speech and cognitive-communication evaluation in setting of basal ganglia stroke. Pt with recent d/c from CIR.    Assessment completed via informal means, standardized assessment (Cognitive-Linguistic Quick Test), and PROM (Communication Effectiveness Survey).    Based on today's assessment, pt presents with a moderate-severe dysarthria impacting intelligibility across all levels of speech (~50% to an unfamiliar listener). Dysarthria c/b reduced vocal loudness, intermittent hoarse vocal quality, and articulatory imprecision. Additionally, pt with mild cognitive-communication deficits. Per CLQT, pt with difficulty with attention and executive functioning. Pt scored 0/12 on Symbol Cancellation due to marking the incorrect symbol 12 times. This subtest is weighted heavily in the attention subscore. SLP noted wordfinding deficits conversationally and during divergent naming subtest.    Pt highly motivated to return to [facility]. 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 He endorsed that his current deficits make communicating difficulty in a variety of settings with different communication partners.    Recommend course of ST for above mentioned deficits with emphasis on compensation, retraining, and education.   OBJECTIVE IMPAIRMENTS include attention, executive functioning, expressive language, and dysarthria. These impairments are limiting patient from effectively communicating at home and in community. Factors affecting potential to achieve goals and functional outcome are n/a. Patient  will benefit from skilled SLP services to address above impairments and improve overall function.  REHAB POTENTIAL: Good  PLAN: SLP FREQUENCY: 2x/week  SLP DURATION: 12 weeks  PLANNED INTERVENTIONS: Cueing hierachy, Cognitive reorganization, Internal/external aids, Functional tasks, Multimodal communication approach, SLP instruction and feedback, Compensatory strategies, and Patient/family education    Isaac Meza, M.S., CCC-SLP Speech-Language Pathologist [place] [facility] [id number] [place])  [facility] at [facility] [address] [place], [place], [address] Phone: [phone]   Fax:  [phone]             "

## 2024-04-20 ENCOUNTER — Encounter: Payer: Self-pay | Admitting: Internal Medicine

## 2024-04-20 ENCOUNTER — Ambulatory Visit

## 2024-04-21 ENCOUNTER — Ambulatory Visit: Admitting: Internal Medicine

## 2024-04-22 ENCOUNTER — Other Ambulatory Visit: Payer: Self-pay

## 2024-04-22 ENCOUNTER — Emergency Department (HOSPITAL_COMMUNITY)

## 2024-04-22 ENCOUNTER — Emergency Department (HOSPITAL_COMMUNITY)
Admission: EM | Admit: 2024-04-22 | Discharge: 2024-04-23 | Disposition: A | Discharge status: Home / Self Care | Attending: Emergency Medicine | Admitting: Emergency Medicine

## 2024-04-22 ENCOUNTER — Encounter (HOSPITAL_COMMUNITY): Payer: Self-pay

## 2024-04-22 DIAGNOSIS — Z8673 Personal history of transient ischemic attack (TIA), and cerebral infarction without residual deficits: Secondary | ICD-10-CM | POA: Insufficient documentation

## 2024-04-22 DIAGNOSIS — Z87891 Personal history of nicotine dependence: Secondary | ICD-10-CM | POA: Insufficient documentation

## 2024-04-22 DIAGNOSIS — J189 Pneumonia, unspecified organism: Secondary | ICD-10-CM

## 2024-04-22 DIAGNOSIS — R042 Hemoptysis: Secondary | ICD-10-CM | POA: Diagnosis present

## 2024-04-22 DIAGNOSIS — J181 Lobar pneumonia, unspecified organism: Secondary | ICD-10-CM | POA: Insufficient documentation

## 2024-04-22 HISTORY — DX: Cerebral infarction, unspecified: I63.9

## 2024-04-22 LAB — BASIC METABOLIC PANEL WITH GFR
Anion gap: 12 (ref 5–15)
BUN: 12 mg/dL (ref 6–20)
CO2: 25 mmol/L (ref 22–32)
Calcium: 9.1 mg/dL (ref 8.9–10.3)
Chloride: 96 mmol/L — ABNORMAL LOW (ref 98–111)
Creatinine, Ser: 0.82 mg/dL (ref 0.61–1.24)
GFR, Estimated: >60 mL/min
Glucose, Bld: 109 mg/dL — ABNORMAL HIGH (ref 70–99)
Potassium: 4.1 mmol/L (ref 3.5–5.1)
Sodium: 133 mmol/L — ABNORMAL LOW (ref 135–145)

## 2024-04-22 LAB — CBC
HCT: 37.9 % — ABNORMAL LOW (ref 39.0–52.0)
Hemoglobin: 12.9 g/dL — ABNORMAL LOW (ref 13.0–17.0)
MCH: 31.9 pg (ref 26.0–34.0)
MCHC: 34 g/dL (ref 30.0–36.0)
MCV: 93.8 fL (ref 80.0–100.0)
Platelets: 454 K/uL — ABNORMAL HIGH (ref 150–400)
RBC: 4.04 MIL/uL — ABNORMAL LOW (ref 4.22–5.81)
RDW: 11.8 % (ref 11.5–15.5)
WBC: 16.7 K/uL — ABNORMAL HIGH (ref 4.0–10.5)
nRBC: 0 % (ref 0.0–0.2)

## 2024-04-22 LAB — PROTIME-INR
INR: 1 (ref 0.8–1.2)
Prothrombin Time: 13.8 s (ref 11.4–15.2)

## 2024-04-22 NOTE — ED Triage Notes (Signed)
 Pt reports coughing up blood. Family member has a sample of what pt has been coughing up. They report he has been coughing up chunks of blood. Pt is on blood thinners.

## 2024-04-23 ENCOUNTER — Encounter (HOSPITAL_COMMUNITY): Payer: Self-pay

## 2024-04-23 ENCOUNTER — Emergency Department (HOSPITAL_COMMUNITY)

## 2024-04-23 LAB — HEPATIC FUNCTION PANEL
ALT: 8 U/L (ref 0–44)
AST: 15 U/L (ref 15–41)
Albumin: 4.1 g/dL (ref 3.5–5.0)
Alkaline Phosphatase: 107 U/L (ref 38–126)
Bilirubin, Direct: 0.1 mg/dL (ref 0.0–0.2)
Indirect Bilirubin: 0.2 mg/dL — ABNORMAL LOW (ref 0.3–0.9)
Total Bilirubin: 0.4 mg/dL (ref 0.0–1.2)
Total Protein: 7.5 g/dL (ref 6.5–8.1)

## 2024-04-23 MED ORDER — SODIUM CHLORIDE 0.9 % IV SOLN
1.0000 g | Freq: Once | INTRAVENOUS | Status: AC
  Administered 2024-04-23: 1 g via INTRAVENOUS
  Filled 2024-04-23: qty 10

## 2024-04-23 MED ORDER — AMOXICILLIN 500 MG PO CAPS: 1000.0000 mg | ORAL_CAPSULE | Freq: Two times a day (BID) | ORAL | 0 refills | Status: DC

## 2024-04-23 MED ORDER — IOHEXOL 350 MG/ML SOLN
75.0000 mL | Freq: Once | INTRAVENOUS | Status: AC | PRN
  Administered 2024-04-23: 75 mL via INTRAVENOUS

## 2024-04-23 NOTE — ED Provider Notes (Signed)
 " [facility] Provider Note   CSN: [id number] Arrival date & time: 04/22/24  2019     History Chief Complaint  Patient presents with   Hemoptysis    HPI Isaac Meza is a 54 y.o. male presenting for chief complaint of hemoptysis.  54 year old male just out of the hospital after a stroke coming in with a chief complaint of dark bloody cough today.  Longstanding history of smoking recently quit after the stroke. Started on Brilinta  after the stroke. Aspirin  dual antiplatelet therapy also initiated.  He states that 4 times tonight he has had approximately 2 tablespoons of bloody cough. Always dark.  Has some vague chest discomfort that he attributes to a fall a few days ago.  Otherwise denies fevers chills nausea vomiting syncope shortness of breath.  Patient's recorded medical, surgical, social, medication list and allergies were reviewed in the Snapshot window as part of the initial history.   Review of Systems   Review of Systems  Constitutional:  Negative for chills and fever.  HENT:  Negative for ear pain and sore throat.   Eyes:  Negative for pain and visual disturbance.  Respiratory:  Positive for cough and shortness of breath.   Cardiovascular:  Negative for chest pain and palpitations.  Gastrointestinal:  Negative for abdominal pain and vomiting.  Genitourinary:  Negative for dysuria and hematuria.  Musculoskeletal:  Negative for arthralgias and back pain.  Skin:  Negative for color change and rash.  Neurological:  Negative for seizures and syncope.  All other systems reviewed and are negative.   Physical Exam Updated Vital Signs BP 107/86   Pulse 89   Temp 98.5 F (36.9 C) (Oral)   Resp 11   Ht 5' 8 (1.727 m)   Wt 59 kg   SpO2 98%   BMI 19.77 kg/m  Physical Exam Vitals and nursing note reviewed.  Constitutional:      General: He is not in acute distress.    Appearance: He is well-developed.  HENT:     Head:  Normocephalic and atraumatic.  Eyes:     Conjunctiva/sclera: Conjunctivae normal.  Cardiovascular:     Rate and Rhythm: Normal rate and regular rhythm.     Heart sounds: No murmur heard. Pulmonary:     Effort: Pulmonary effort is normal. No respiratory distress.     Breath sounds: Normal breath sounds.  Abdominal:     Palpations: Abdomen is soft.     Tenderness: There is no abdominal tenderness.  Musculoskeletal:        General: No swelling.     Cervical back: Neck supple.  Skin:    General: Skin is warm and dry.     Capillary Refill: Capillary refill takes less than 2 seconds.  Neurological:     Mental Status: He is alert.  Psychiatric:        Mood and Affect: Mood normal.      ED Course/ Medical Decision Making/ A&P    Procedures Procedures   Medications Ordered in ED Medications  cefTRIAXone  (ROCEPHIN ) 1 g in sodium chloride  0.9 % 100 mL IVPB (1 g Intravenous New Bag/Given 04/23/24 0251)  iohexol  (OMNIPAQUE ) 350 MG/ML injection 75 mL (75 mLs Intravenous Contrast Given 04/23/24 0136)    Medical Decision Making:   This is a 54 year old male presenting with a chief complaint of hemoptysis. His history of present illness and physical exam findings are most consistent with likely bronchitis secondary to his longstanding smoking  with blood formation secondary to recently being started on antiplatelets after a stroke. However PE, pneumonia, bronchial hemorrhage would all be on the differential as well. Will check with lab work as well as CT angiography to evaluate for active extravasation.  Reassessment: CT angiography confirms diagnosis of pneumonia.  Will start on Rocephin .  His pneumonia severity index is 61 putting him at less than a 1% chance for mortality from his pneumonia.  Patient feels comfortable discharge with outpatient antibiotics.  Given his recent admission we will load him with IV Rocephin  and transition over to oral cephalosporin.  Recommend follow-up with PCP  within 48 hours for reassessment of condition. Currently submassive amount of hemoptysis.  Discussed return precautions regarding increasing volumes.  Disposition:  I have considered need for hospitalization, however, considering all of the above, I believe this patient is stable for discharge at this time.  Patient/family educated about specific return precautions for given chief complaint and symptoms.  Patient/family educated about follow-up with PCP.     Patient/family expressed understanding of return precautions and need for follow-up. Patient spoken to regarding all imaging and laboratory results and appropriate follow up for these results. All education provided in verbal form with additional information in written form. Time was allowed for answering of patient questions. Patient discharged.    Emergency Department Medication Summary:   Medications  cefTRIAXone  (ROCEPHIN ) 1 g in sodium chloride  0.9 % 100 mL IVPB (1 g Intravenous New Bag/Given 04/23/24 0251)  iohexol  (OMNIPAQUE ) 350 MG/ML injection 75 mL (75 mLs Intravenous Contrast Given 04/23/24 0136)          Clinical Impression:  1. Hemoptysis   2. Community acquired pneumonia of left lower lobe of lung      Discharge   Final Clinical Impression(s) / ED Diagnoses Final diagnoses:  Hemoptysis  Community acquired pneumonia of left lower lobe of lung    Rx / DC Orders ED Discharge Orders          Ordered    amoxicillin  (AMOXIL ) 500 MG capsule  2 times daily        04/23/24 0309              Jerral Meth, MD 04/23/24 0310  "

## 2024-04-24 ENCOUNTER — Inpatient Hospital Stay
Admission: EM | Admit: 2024-04-24 | Discharge: 2024-04-26 | DRG: 194 | Disposition: A | Discharge status: Home / Self Care | Attending: Hospitalist | Admitting: Hospitalist

## 2024-04-24 ENCOUNTER — Emergency Department

## 2024-04-24 ENCOUNTER — Other Ambulatory Visit: Payer: Self-pay

## 2024-04-24 DIAGNOSIS — E871 Hypo-osmolality and hyponatremia: Secondary | ICD-10-CM | POA: Diagnosis present

## 2024-04-24 DIAGNOSIS — F1090 Alcohol use, unspecified, uncomplicated: Secondary | ICD-10-CM | POA: Diagnosis present

## 2024-04-24 DIAGNOSIS — F17210 Nicotine dependence, cigarettes, uncomplicated: Secondary | ICD-10-CM | POA: Diagnosis present

## 2024-04-24 DIAGNOSIS — J189 Pneumonia, unspecified organism: Principal | ICD-10-CM | POA: Diagnosis present

## 2024-04-24 DIAGNOSIS — T45525A Adverse effect of antithrombotic drugs, initial encounter: Secondary | ICD-10-CM | POA: Diagnosis present

## 2024-04-24 DIAGNOSIS — Z7902 Long term (current) use of antithrombotics/antiplatelets: Secondary | ICD-10-CM

## 2024-04-24 DIAGNOSIS — E785 Hyperlipidemia, unspecified: Secondary | ICD-10-CM | POA: Diagnosis present

## 2024-04-24 DIAGNOSIS — E878 Other disorders of electrolyte and fluid balance, not elsewhere classified: Secondary | ICD-10-CM | POA: Diagnosis present

## 2024-04-24 DIAGNOSIS — R042 Hemoptysis: Secondary | ICD-10-CM | POA: Diagnosis present

## 2024-04-24 DIAGNOSIS — Z79899 Other long term (current) drug therapy: Secondary | ICD-10-CM

## 2024-04-24 DIAGNOSIS — D6832 Hemorrhagic disorder due to extrinsic circulating anticoagulants: Secondary | ICD-10-CM | POA: Diagnosis present

## 2024-04-24 DIAGNOSIS — Z7982 Long term (current) use of aspirin: Secondary | ICD-10-CM

## 2024-04-24 DIAGNOSIS — F17200 Nicotine dependence, unspecified, uncomplicated: Secondary | ICD-10-CM | POA: Insufficient documentation

## 2024-04-24 DIAGNOSIS — I69351 Hemiplegia and hemiparesis following cerebral infarction affecting right dominant side: Secondary | ICD-10-CM

## 2024-04-24 DIAGNOSIS — I10 Essential (primary) hypertension: Secondary | ICD-10-CM | POA: Diagnosis present

## 2024-04-24 DIAGNOSIS — I639 Cerebral infarction, unspecified: Secondary | ICD-10-CM | POA: Diagnosis present

## 2024-04-24 DIAGNOSIS — D75839 Thrombocytosis, unspecified: Secondary | ICD-10-CM | POA: Diagnosis present

## 2024-04-24 LAB — CBC WITH DIFFERENTIAL/PLATELET
Abs Immature Granulocytes: 0.12 K/uL — ABNORMAL HIGH (ref 0.00–0.07)
Basophils Absolute: 0 K/uL (ref 0.0–0.1)
Basophils Relative: 0 %
Eosinophils Absolute: 0.1 K/uL (ref 0.0–0.5)
Eosinophils Relative: 0 %
HCT: 32.7 % — ABNORMAL LOW (ref 39.0–52.0)
Hemoglobin: 11.2 g/dL — ABNORMAL LOW (ref 13.0–17.0)
Immature Granulocytes: 1 %
Lymphocytes Relative: 10 %
Lymphs Abs: 1.7 K/uL (ref 0.7–4.0)
MCH: 31.4 pg (ref 26.0–34.0)
MCHC: 34.3 g/dL (ref 30.0–36.0)
MCV: 91.6 fL (ref 80.0–100.0)
Monocytes Absolute: 1.3 K/uL — ABNORMAL HIGH (ref 0.1–1.0)
Monocytes Relative: 8 %
Neutro Abs: 13.2 K/uL — ABNORMAL HIGH (ref 1.7–7.7)
Neutrophils Relative %: 81 %
Platelets: 413 K/uL — ABNORMAL HIGH (ref 150–400)
RBC: 3.57 MIL/uL — ABNORMAL LOW (ref 4.22–5.81)
RDW: 11.8 % (ref 11.5–15.5)
WBC: 16.4 K/uL — ABNORMAL HIGH (ref 4.0–10.5)
nRBC: 0 % (ref 0.0–0.2)

## 2024-04-24 LAB — BASIC METABOLIC PANEL WITH GFR
Anion gap: 16 — ABNORMAL HIGH (ref 5–15)
BUN: 16 mg/dL (ref 6–20)
CO2: 23 mmol/L (ref 22–32)
Calcium: 9.7 mg/dL (ref 8.9–10.3)
Chloride: 94 mmol/L — ABNORMAL LOW (ref 98–111)
Creatinine, Ser: 0.67 mg/dL (ref 0.61–1.24)
GFR, Estimated: >60 mL/min
Glucose, Bld: 94 mg/dL (ref 70–99)
Potassium: 4 mmol/L (ref 3.5–5.1)
Sodium: 133 mmol/L — ABNORMAL LOW (ref 135–145)

## 2024-04-24 LAB — MAGNESIUM: Magnesium: 1.9 mg/dL (ref 1.7–2.4)

## 2024-04-24 LAB — RESP PANEL BY RT-PCR (RSV, FLU A&B, COVID)  RVPGX2
Influenza A by PCR: NEGATIVE
Influenza B by PCR: NEGATIVE
Resp Syncytial Virus by PCR: NEGATIVE
SARS Coronavirus 2 by RT PCR: NEGATIVE

## 2024-04-24 LAB — LACTIC ACID, PLASMA: Lactic Acid, Venous: 0.9 mmol/L (ref 0.5–1.9)

## 2024-04-24 MED ORDER — ACETAMINOPHEN 650 MG RE SUPP: 650.0000 mg | Freq: Four times a day (QID) | RECTAL | Status: DC | PRN

## 2024-04-24 MED ORDER — ACETAMINOPHEN 325 MG PO TABS
650.0000 mg | ORAL_TABLET | Freq: Four times a day (QID) | ORAL | Status: DC | PRN
  Administered 2024-04-25 (×2): 650 mg via ORAL
  Filled 2024-04-24 (×2): qty 2

## 2024-04-24 MED ORDER — SENNOSIDES-DOCUSATE SODIUM 8.6-50 MG PO TABS: 1.0000 | ORAL_TABLET | Freq: Every evening | ORAL | Status: DC | PRN

## 2024-04-24 MED ORDER — TRANEXAMIC ACID FOR INHALATION
500.0000 mg | Freq: Once | RESPIRATORY_TRACT | Status: DC
  Filled 2024-04-24: qty 10

## 2024-04-24 MED ORDER — SODIUM CHLORIDE 0.9% FLUSH
3.0000 mL | Freq: Two times a day (BID) | INTRAVENOUS | Status: DC
  Administered 2024-04-24 – 2024-04-26 (×4): 3 mL via INTRAVENOUS

## 2024-04-24 MED ORDER — SODIUM CHLORIDE 0.9 % IV SOLN
1.0000 g | Freq: Once | INTRAVENOUS | Status: AC
  Administered 2024-04-24: 1 g via INTRAVENOUS
  Filled 2024-04-24: qty 10

## 2024-04-24 MED ORDER — SODIUM CHLORIDE 0.9 % IV SOLN
100.0000 mg | Freq: Two times a day (BID) | INTRAVENOUS | Status: DC
  Administered 2024-04-24 – 2024-04-26 (×4): 100 mg via INTRAVENOUS
  Filled 2024-04-24 (×5): qty 100

## 2024-04-24 MED ORDER — BENZONATATE 100 MG PO CAPS
200.0000 mg | ORAL_CAPSULE | Freq: Three times a day (TID) | ORAL | Status: DC
  Administered 2024-04-24 – 2024-04-26 (×5): 200 mg via ORAL
  Filled 2024-04-24 (×5): qty 2

## 2024-04-24 MED ORDER — SODIUM CHLORIDE 0.9 % IV SOLN: 500.0000 mg | Freq: Once | INTRAVENOUS | Status: DC

## 2024-04-24 MED ORDER — ONDANSETRON HCL 4 MG PO TABS: 4.0000 mg | ORAL_TABLET | Freq: Four times a day (QID) | ORAL | Status: DC | PRN

## 2024-04-24 MED ORDER — SODIUM CHLORIDE 0.9 % IV SOLN
100.0000 mg | Freq: Once | INTRAVENOUS | Status: AC
  Administered 2024-04-24: 100 mg via INTRAVENOUS
  Filled 2024-04-24: qty 100

## 2024-04-24 MED ORDER — ONDANSETRON HCL 4 MG/2ML IJ SOLN: 4.0000 mg | Freq: Four times a day (QID) | INTRAMUSCULAR | Status: DC | PRN

## 2024-04-24 MED ORDER — OXYCODONE HCL 5 MG PO TABS
5.0000 mg | ORAL_TABLET | Freq: Once | ORAL | Status: AC
  Administered 2024-04-24: 5 mg via ORAL
  Filled 2024-04-24: qty 1

## 2024-04-24 MED ORDER — IPRATROPIUM-ALBUTEROL 0.5-2.5 (3) MG/3ML IN SOLN: 3.0000 mL | Freq: Four times a day (QID) | RESPIRATORY_TRACT | Status: DC | PRN

## 2024-04-24 MED ORDER — SODIUM CHLORIDE 0.9 % IV SOLN
2.0000 g | INTRAVENOUS | Status: DC
  Administered 2024-04-25: 2 g via INTRAVENOUS
  Filled 2024-04-24 (×2): qty 20

## 2024-04-24 NOTE — ED Notes (Signed)
 Bed alarm on, grip socks on, and fall risk bracelet on

## 2024-04-24 NOTE — ED Provider Notes (Signed)
 "  [facility] Provider Note    Event Date/Time   First MD Initiated Contact with Patient 04/24/24 [phone]     (approximate)   History   Cough and hemoptysis   HPI  Isaac Meza is a 54 y.o. male recently discharged from the hospital after treatment for stroke who presented to [facility] emergency department yesterday for cough, hemoptysis.  Had workup at that time including CT angiography which demonstrated left lower lobe pneumonia, he reports worsening cough and continued mild hemoptysis     Physical Exam   Triage Vital Signs: ED Triage Vitals [04/24/24 1542]  Encounter Vitals Group     BP (!) 128/90     Girls Systolic BP Percentile      Girls Diastolic BP Percentile      Boys Systolic BP Percentile      Boys Diastolic BP Percentile      Pulse Rate (!) 117     Resp (!) 22     Temp 97.8 F (36.6 C)     Temp Source Oral     SpO2 97 %     Weight 59 kg (130 lb 1.1 oz)     Height 1.727 m (5' 8)     Head Circumference      Peak Flow      Pain Score 0     Pain Loc      Pain Education      Exclude from Growth Chart     Most recent vital signs: Vitals:   04/24/24 1542  BP: (!) 128/90  Pulse: (!) 117  Resp: (!) 22  Temp: 97.8 F (36.6 C)  SpO2: 97%     General: Awake, no distress.  CV:  Good peripheral perfusion.  Tachycardia Resp:   Mild tachypnea Abd:  No distention.  Other:     ED Results / Procedures / Treatments   Labs (all labs ordered are listed, but only abnormal results are displayed) Labs Reviewed  CBC WITH DIFFERENTIAL/PLATELET - Abnormal; Notable for the following components:      Result Value   WBC 16.4 (*)    RBC 3.57 (*)    Hemoglobin 11.2 (*)    HCT 32.7 (*)    Platelets 413 (*)    Neutro Abs 13.2 (*)    Monocytes Absolute 1.3 (*)    Abs Immature Granulocytes 0.12 (*)    All other components within normal limits  BASIC METABOLIC PANEL WITH GFR - Abnormal; Notable for the following components:   Sodium  133 (*)    Chloride 94 (*)    Anion gap 16 (*)    All other components within normal limits  LACTIC ACID, PLASMA     EKG     RADIOLOGY Chest x-ray pending    PROCEDURES:  Critical Care performed:   Procedures   MEDICATIONS ORDERED IN ED: Medications  cefTRIAXone  (ROCEPHIN ) 1 g in sodium chloride  0.9 % 100 mL IVPB (has no administration in time range)  azithromycin (ZITHROMAX) 500 mg in sodium chloride  0.9 % 250 mL IVPB (has no administration in time range)     IMPRESSION / MDM / ASSESSMENT AND PLAN / ED COURSE  I reviewed the triage vital signs and the nursing notes. Patient's presentation is most consistent with acute presentation with potential threat to life or bodily function.  Patient with known pneumonia on CT angiography performed yesterday.  Worsening cough shortness of breath and some hemoptysis, he is on dual antiplatelet agents for  recent stroke  Had blood cultures drawn yesterday, will add lactic today, repeat chest x-ray  White blood cell count is elevated, he is tacky and tachypneic with worsening symptoms, will give IV Rocephin , and azithromycin and discussed with the hospitalist for admission        FINAL CLINICAL IMPRESSION(S) / ED DIAGNOSES   Final diagnoses:  Community acquired pneumonia of left lower lobe of lung     Rx / DC Orders   ED Discharge Orders     None        Note:  This document was prepared using Dragon voice recognition software and may include unintentional dictation errors.   Arlander Charleston, MD 04/24/24 1629  "

## 2024-04-24 NOTE — ED Triage Notes (Signed)
 Pt here via EMS for coughing up blood. Pt seen at [facility] for the same thing on 1/16. Pt had CTA of chest yesterday that showed pneumonia. Pt started abx. Pt is on blood thinner.

## 2024-04-24 NOTE — ED Notes (Signed)
 Notified MD Fernand of pt increased blood with hemoptysis and abdominal pain. Per MD, will assess shortly.

## 2024-04-24 NOTE — H&P (Signed)
 " History and Physical    Isaac Meza FMW:[id number] DOB: [birth year] DOA: 04/24/2024  DOS: the patient was seen and examined on 04/24/2024  PCP: [name], DO   Patient coming from: Home  I have personally briefly reviewed patient's old medical records in [facility] and CareEverywhere  HPI:   Isaac Meza is a 54 y.o. year old male with medical history of hypertension, hyperlipidemia, alcohol use disorder, tobacco use disorder presenting to the ED with coughing, and hemoptysis.  Patient presented to the ED yesterday and was found that he had pneumonia and was discharged on oral antibiotics.  He was started on amoxicillin .  Given persistent symptoms patient is here today. On arrival to the ED patient was noted to be HDS stable.  Lab work and imaging obtained.  CBC with significant leukocytosis at 16.4, mild anemia near recent baseline.  Mild thrombocytosis.  BMP with mild hyponatremia and hypochloremia otherwise unremarkable.  Lactic acid checked and normal.  Chest x-ray showed left lower lobe consolidation.  Respiratory panel ordered and pending.  Given continued symptoms, [facility] contacted for admission.  Review of Systems: As mentioned in the history of present illness. All other systems reviewed and are negative.   Past Medical History:  Diagnosis Date   Asthma    childhood   Hypertension    Stroke [facility])     Past Surgical History:  Procedure Laterality Date   APPENDECTOMY  2024   BACK SURGERY     ESOPHAGOGASTRODUODENOSCOPY (EGD) WITH PROPOFOL  N/A 08/01/2022   Procedure: ESOPHAGOGASTRODUODENOSCOPY (EGD) WITH PROPOFOL ;  Surgeon: Rollin Dover, MD;  Location: [facility] ENDOSCOPY;  Service: Gastroenterology;  Laterality: N/A;   EUS N/A 08/01/2022   Procedure: UPPER ENDOSCOPIC ULTRASOUND (EUS) LINEAR;  Surgeon: Rollin Dover, MD;  Location: [facility] ENDOSCOPY;  Service: Gastroenterology;  Laterality: N/A;   FINE NEEDLE ASPIRATION  08/01/2022   Procedure: FINE NEEDLE ASPIRATION  (FNA) LINEAR;  Surgeon: Rollin Dover, MD;  Location: [facility] ENDOSCOPY;  Service: Gastroenterology;;   FRACTURE SURGERY Right 2007   arm   XI ROBOTIC LAPAROSCOPIC ASSISTED APPENDECTOMY N/A 05/16/2022   Procedure: XI ROBOTIC LAPAROSCOPIC ASSISTED APPENDECTOMY;  Surgeon: Desiderio Schanz, MD;  Location: [facility] ORS;  Service: General;  Laterality: N/A;     Allergies[1]  No family history on file.  Prior to Admission medications  Medication Sig Start Date End Date Taking? Authorizing Provider  acetaminophen  (TYLENOL ) 325 MG tablet Take 2 tablets (650 mg total) by mouth every 6 (six) hours as needed for mild pain (pain score 1-3) or moderate pain (pain score 4-6). 04/05/24  Yes Angiulli, Toribio PARAS, PA-C  amoxicillin  (AMOXIL ) 500 MG capsule Take 2 capsules (1,000 mg total) by mouth 2 (two) times daily for 5 days. 04/23/24 04/28/24 Yes Jerral Meth, MD  aspirin  EC 81 MG tablet Take 1 tablet (81 mg total) by mouth daily. Swallow whole. 03/20/24  Yes Laurita Pillion, MD  baclofen  (LIORESAL ) 10 MG tablet Take 1 tablet (10 mg total) by mouth 3 (three) times daily. 04/11/24  Yes Angiulli, Toribio PARAS, PA-C  folic acid  (FOLVITE ) 1 MG tablet Take 1 tablet (1 mg total) by mouth daily. 04/11/24  Yes Angiulli, Toribio PARAS, PA-C  lisinopril  (ZESTRIL ) 5 MG tablet Take 1 tablet (5 mg total) by mouth daily. 04/11/24  Yes Angiulli, Daniel J, PA-C  Multiple Vitamin (MULTIVITAMIN WITH MINERALS) TABS tablet Take 1 tablet by mouth daily. 04/06/24  Yes Angiulli, Toribio PARAS, PA-C  oxyCODONE  (OXY IR/ROXICODONE ) 5 MG immediate release tablet Take 1 tablet (5 mg total)  by mouth every 6 (six) hours as needed for severe pain (pain score 7-10). 04/11/24  Yes Angiulli, Toribio PARAS, PA-C  rosuvastatin  (CRESTOR ) 20 MG tablet Take 1 tablet (20 mg total) by mouth daily. 04/11/24  Yes Angiulli, Toribio PARAS, PA-C  thiamine  (VITAMIN B1) 100 MG tablet Take 1 tablet (100 mg total) by mouth daily. 04/11/24  Yes Angiulli, Toribio PARAS, PA-C  ticagrelor  (BRILINTA ) 90 MG TABS  tablet Take 1 tablet (90 mg total) by mouth 2 (two) times daily. 04/11/24  Yes Angiulli, Toribio PARAS, PA-C  docusate sodium  (COLACE) 100 MG capsule Take 1 capsule (100 mg total) by mouth 2 (two) times daily. Patient not taking: No sig reported 04/05/24   Pegge Toribio PARAS, PA-C  nicotine  polacrilex (NICORETTE ) 2 MG gum Take 1 each (2 mg total) by mouth as needed for smoking cessation. Patient not taking: No sig reported 04/11/24   Angiulli, Toribio PARAS, PA-C    Social History:  reports that he has been smoking cigarettes. He started smoking about 40 years ago. He has a 20.3 pack-year smoking history. He has never used smokeless tobacco. He reports current alcohol use of about 21.0 standard drinks of alcohol per week. He reports that he does not currently use drugs.    Physical Exam: Vitals:   04/24/24 1542  BP: (!) 128/90  Pulse: (!) 117  Resp: (!) 22  Temp: 97.8 F (36.6 C)  TempSrc: Oral  SpO2: 97%  Weight: 59 kg  Height: 5' 8 (1.727 m)    Gen: NAD HENT: NCAT CV: normal heart sounds Lung: CTAB Abd: No TTP, normal bowel sounds MSK: No asymmetry, good bulk and tone Neuro: alert and oriented   Labs on Admission: I have personally reviewed following labs and imaging studies  CBC: Recent Labs  Lab 04/22/24 2049 04/24/24 1544  WBC 16.7* 16.4*  NEUTROABS  --  13.2*  HGB 12.9* 11.2*  HCT 37.9* 32.7*  MCV 93.8 91.6  PLT 454* 413*   Basic Metabolic Panel: Recent Labs  Lab 04/22/24 2049 04/24/24 1544  NA 133* 133*  K 4.1 4.0  CL 96* 94*  CO2 25 23  GLUCOSE 109* 94  BUN 12 16  CREATININE 0.82 0.67  CALCIUM  9.1 9.7  MG  --  1.9   GFR: Estimated Creatinine Clearance: 91.2 mL/min (by C-G formula based on SCr of 0.67 mg/dL). Liver Function Tests: Recent Labs  Lab 04/23/24 0120  AST 15  ALT 8  ALKPHOS 107  BILITOT 0.4  PROT 7.5  ALBUMIN 4.1   No results for input(s): LIPASE, AMYLASE in the last 168 hours. No results for input(s): AMMONIA in the last 168  hours. Coagulation Profile: Recent Labs  Lab 04/22/24 2049  INR 1.0   Cardiac Enzymes: No results for input(s): CKTOTAL, CKMB, CKMBINDEX, TROPONINI, TROPONINIHS in the last 168 hours. BNP (last 3 results) No results for input(s): BNP in the last 8760 hours. HbA1C: No results for input(s): HGBA1C in the last 72 hours. CBG: No results for input(s): GLUCAP in the last 168 hours. Lipid Profile: No results for input(s): CHOL, HDL, LDLCALC, TRIG, CHOLHDL, LDLDIRECT in the last 72 hours. Thyroid Function Tests: No results for input(s): TSH, T4TOTAL, FREET4, T3FREE, THYROIDAB in the last 72 hours. Anemia Panel: No results for input(s): VITAMINB12, FOLATE, FERRITIN, TIBC, IRON, RETICCTPCT in the last 72 hours. Urine analysis:    Component Value Date/Time   COLORURINE YELLOW 03/23/2024 1246   APPEARANCEUR HAZY (A) 03/23/2024 1246   LABSPEC 1.025 03/23/2024 1246  PHURINE 5.0 03/23/2024 1246   GLUCOSEU NEGATIVE 03/23/2024 1246   HGBUR NEGATIVE 03/23/2024 1246   BILIRUBINUR NEGATIVE 03/23/2024 1246   KETONESUR NEGATIVE 03/23/2024 1246   PROTEINUR 30 (A) 03/23/2024 1246   NITRITE NEGATIVE 03/23/2024 1246   LEUKOCYTESUR NEGATIVE 03/23/2024 1246    Radiological Exams on Admission: I have personally reviewed images DG Chest 2 View Result Date: 04/24/2024 CLINICAL DATA:  Shortness of breath. EXAM: CHEST - 2 VIEW COMPARISON:  04/22/2024 04/23/2024. FINDINGS: The heart size and mediastinal contours are within normal limits. There is chronic elevation of the left diaphragm with stable consolidation in the left lower lobe. Mild atelectasis is present at the right lung base. There is a suspected small left pleural effusion. No pneumothorax is seen bilaterally. Old healed rib fractures are noted bilaterally. Compression deformities with vertebroplasty changes in the upper thoracic spine appear stable. IMPRESSION: 1. Stable consolidation in the left  lower lobe. 2. Suspected small left pleural effusion. Electronically Signed   By: Leita Birmingham M.D.   On: 04/24/2024 17:31   CT Angio Chest Pulmonary Embolism (PE) W or WO Contrast Result Date: 04/23/2024 EXAM: CTA CHEST 04/23/2024 01:36:19 AM TECHNIQUE: CTA of the chest was performed without and with the administration of 75 mL of intravenous iohexol  (OMNIPAQUE ) 350 MG/ML injection. Multiplanar reformatted images are provided for review. MIP images are provided for review. Automated exposure control, iterative reconstruction, and/or weight based adjustment of the mA/kV was utilized to reduce the radiation dose to as low as reasonably achievable. COMPARISON: Chest x-ray today. CLINICAL HISTORY: Pulmonary embolism (PE) suspected, high prob. FINDINGS: PULMONARY ARTERIES: Pulmonary arteries are adequately opacified for evaluation. No acute pulmonary embolus. Main pulmonary artery is normal in caliber. MEDIASTINUM: The heart and pericardium demonstrate no acute abnormality. There is no acute abnormality of the thoracic aorta. LYMPH NODES: No mediastinal, hilar or axillary lymphadenopathy. LUNGS AND PLEURA: Airspace disease in the left lower lobe with air bronchograms concerning for pneumonia. Less pronounced right basilar airspace opacity, favored to represent atelectasis. No effusions. No pneumothorax. UPPER ABDOMEN: Limited images of the upper abdomen are unremarkable. SOFT TISSUES AND BONES: Kyphosis centered in the mid Thoracic spine at the level of chronic compression fractures and vertebroplasty changes. No acute soft tissue abnormality. IMPRESSION: 1. No pulmonary embolism. 2. Left lower lobe airspace disease concerning for pneumonia. 3. Right basilar airspace opacity, favored to represent atelectasis. Electronically signed by: Franky Crease MD 04/23/2024 01:45 AM EST RP Workstation: [id number]   DG Chest 2 View Result Date: 04/22/2024 EXAM: 2 VIEW(S) XRAY OF THE CHEST 04/22/2024 08:57:00 PM COMPARISON:  04/01/2024 CLINICAL HISTORY: hemoptysis hemoptysis hemoptysis FINDINGS: LUNGS AND PLEURA: Linear scarring in right lower lung zone, unchanged. Linear and hazy opacity at left lung base. Elevated left hemidiaphragm. No pleural effusion. No pneumothorax. HEART AND MEDIASTINUM: No acute abnormality of the cardiac and mediastinal silhouettes. BONES AND SOFT TISSUES: Vertebral augmentation in upper thoracic spine. Chronic bilateral rib fractures. IMPRESSION: 1. Linear and hazy opacity at the left lung base, favored atelectasis though infection is not excluded, and consider follow-up chest radiograph to document resolution. Electronically signed by: Oneil Devonshire MD 04/22/2024 09:03 PM EST RP Workstation: [id number]    EKG: My personal interpretation of EKG shows: Pending    Assessment/Plan Principal Problem:   Community acquired pneumonia Active Problems:   Essential hypertension   Alcohol use disorder   CVA (cerebral vascular accident) (HCC)   Tobacco use disorder   Patient with coughing, hemoptysis and leukocytosis with imaging showing pneumonia.  Blood  cultures ordered yesterday.  Patient was started on amoxicillin  outpatient and currently on Rocephin  and doxycycline .  Will continue this year as hemoptysis could be secondary to pneumonia.  Patient with recent stroke and started on dual antiplatelet therapy including Brilinta  which could be exacerbating this.  If this persist will need pulmonology consult for further recommendations but will treat for pneumonia as that could independently lead to hemoptysis.  Monitor leukocytosis, fever curve.  Hypertension: Continue home lisinopril  5 mg.  Patient is normotensive here.  Hyperlipidemia/history of CVA: Continue home rosuvastatin  20 mg.  Holding DAPT for now given hemoptysis but pt has taken it prior to coming to ED. If resolved by tmrw, can restart aspirin  and then brillinta. May need neurology consultation if hemoptysis continues to be an issue. He was  on plavix  and aspirin  previously but had another stroke leading to change in therapy.   Tobacco use disorder: has stopped since last hospitalization.   VTE prophylaxis:  SCDs  Diet: Heart healthy Code Status:  Full Code Telemetry:  Admission status: Observation, Telemetry bed Patient is from: Home Anticipated d/c is to: Home Anticipated d/c is in: 1-2 days   Family Communication: Updated at bedside  Consults called: None   Severity of Illness: The appropriate patient status for this patient is OBSERVATION. Observation status is judged to be reasonable and necessary in order to provide the required intensity of service to ensure the patient's safety. The patient's presenting symptoms, physical exam findings, and initial radiographic and laboratory data in the context of their medical condition is felt to place them at decreased risk for further clinical deterioration. Furthermore, it is anticipated that the patient will be medically stable for discharge from the hospital within 2 midnights of admission.    Morene Bathe, MD [facility]     [1] No Known Allergies  "

## 2024-04-25 ENCOUNTER — Encounter: Admitting: Registered Nurse

## 2024-04-25 DIAGNOSIS — D6832 Hemorrhagic disorder due to extrinsic circulating anticoagulants: Secondary | ICD-10-CM | POA: Diagnosis present

## 2024-04-25 DIAGNOSIS — I639 Cerebral infarction, unspecified: Secondary | ICD-10-CM

## 2024-04-25 DIAGNOSIS — E785 Hyperlipidemia, unspecified: Secondary | ICD-10-CM | POA: Diagnosis present

## 2024-04-25 DIAGNOSIS — J189 Pneumonia, unspecified organism: Secondary | ICD-10-CM | POA: Diagnosis present

## 2024-04-25 DIAGNOSIS — F17200 Nicotine dependence, unspecified, uncomplicated: Secondary | ICD-10-CM | POA: Diagnosis not present

## 2024-04-25 DIAGNOSIS — Z7902 Long term (current) use of antithrombotics/antiplatelets: Secondary | ICD-10-CM | POA: Diagnosis not present

## 2024-04-25 DIAGNOSIS — I69351 Hemiplegia and hemiparesis following cerebral infarction affecting right dominant side: Secondary | ICD-10-CM | POA: Diagnosis not present

## 2024-04-25 DIAGNOSIS — T45525A Adverse effect of antithrombotic drugs, initial encounter: Secondary | ICD-10-CM | POA: Diagnosis present

## 2024-04-25 DIAGNOSIS — Z79899 Other long term (current) drug therapy: Secondary | ICD-10-CM | POA: Diagnosis not present

## 2024-04-25 DIAGNOSIS — E878 Other disorders of electrolyte and fluid balance, not elsewhere classified: Secondary | ICD-10-CM | POA: Diagnosis present

## 2024-04-25 DIAGNOSIS — R042 Hemoptysis: Secondary | ICD-10-CM | POA: Diagnosis present

## 2024-04-25 DIAGNOSIS — Z7982 Long term (current) use of aspirin: Secondary | ICD-10-CM | POA: Diagnosis not present

## 2024-04-25 DIAGNOSIS — E871 Hypo-osmolality and hyponatremia: Secondary | ICD-10-CM | POA: Diagnosis present

## 2024-04-25 DIAGNOSIS — F17210 Nicotine dependence, cigarettes, uncomplicated: Secondary | ICD-10-CM | POA: Diagnosis present

## 2024-04-25 DIAGNOSIS — D75839 Thrombocytosis, unspecified: Secondary | ICD-10-CM | POA: Diagnosis present

## 2024-04-25 DIAGNOSIS — I10 Essential (primary) hypertension: Secondary | ICD-10-CM

## 2024-04-25 LAB — CBG MONITORING, ED: Glucose-Capillary: 58 mg/dL — ABNORMAL LOW (ref 70–99)

## 2024-04-25 MED ORDER — FOLIC ACID 1 MG PO TABS
1.0000 mg | ORAL_TABLET | Freq: Every day | ORAL | Status: DC
  Administered 2024-04-25 – 2024-04-26 (×2): 1 mg via ORAL
  Filled 2024-04-25 (×2): qty 1

## 2024-04-25 MED ORDER — GUAIFENESIN-DM 100-10 MG/5ML PO SYRP: 5.0000 mL | ORAL_SOLUTION | ORAL | Status: DC | PRN

## 2024-04-25 MED ORDER — GUAIFENESIN-DM 100-10 MG/5ML PO SYRP: 10.0000 mL | ORAL_SOLUTION | ORAL | Status: DC | PRN

## 2024-04-25 MED ORDER — ADULT MULTIVITAMIN W/MINERALS CH
1.0000 | ORAL_TABLET | Freq: Every day | ORAL | Status: DC
  Administered 2024-04-25 – 2024-04-26 (×2): 1 via ORAL
  Filled 2024-04-25 (×2): qty 1

## 2024-04-25 MED ORDER — OXYCODONE HCL 5 MG PO TABS
5.0000 mg | ORAL_TABLET | ORAL | Status: AC | PRN
  Administered 2024-04-25: 5 mg via ORAL
  Filled 2024-04-25: qty 1

## 2024-04-25 MED ORDER — THIAMINE HCL 100 MG PO TABS
100.0000 mg | ORAL_TABLET | Freq: Every day | ORAL | Status: DC
  Administered 2024-04-25 – 2024-04-26 (×2): 100 mg via ORAL
  Filled 2024-04-25 (×4): qty 1

## 2024-04-25 MED ORDER — ASPIRIN 81 MG PO TBEC
81.0000 mg | DELAYED_RELEASE_TABLET | Freq: Every day | ORAL | Status: DC
  Administered 2024-04-25 – 2024-04-26 (×2): 81 mg via ORAL
  Filled 2024-04-25 (×2): qty 1

## 2024-04-25 MED ORDER — ROSUVASTATIN CALCIUM 20 MG PO TABS
20.0000 mg | ORAL_TABLET | Freq: Every day | ORAL | Status: DC
  Administered 2024-04-25 – 2024-04-26 (×2): 20 mg via ORAL
  Filled 2024-04-25 (×2): qty 1

## 2024-04-25 MED ORDER — BACLOFEN 10 MG PO TABS
10.0000 mg | ORAL_TABLET | Freq: Three times a day (TID) | ORAL | Status: DC
  Administered 2024-04-25 – 2024-04-26 (×4): 10 mg via ORAL
  Filled 2024-04-25 (×4): qty 1

## 2024-04-25 NOTE — ED Notes (Signed)
 RN provided pt with orange juice

## 2024-04-25 NOTE — Evaluation (Signed)
 Physical Therapy Evaluation Patient Details Name: Isaac Meza MRN: [id number] DOB: 07/17/1970 Today's Date: 04/25/2024  History of Present Illness  54 y/o male presented to ED on 04/24/24 for coughing and hemoptysis. Admitted for community acquired pneumonia. PMH: HTN, alcohol use disorder, tobacco use disorder, hx of L CVA.  Clinical Impression  Patient admitted with the above. PTA, patient lives with wife and her parents where he is mostly using w/c in the home but ambulates with quad cane and R AFO when wife is present to assist. Able to complete bed mobility with CGA and stood from EOB with similar assist and SPC. Ambulated ~50' with SPC and minA for balance as patient not as comfortable with SPC as he is with quad cane. R AFO donned throughout ambulation. Wife reports ambulation is more difficult since being here. Patient will benefit from skilled PT services during acute stay to address listed deficits. Patient will benefit from ongoing therapy at discharge to maximize functional independence and safety.       If plan is discharge home, recommend the following: A little help with walking and/or transfers;A little help with bathing/dressing/bathroom;Assistance with cooking/housework;Assist for transportation;Direct supervision/assist for financial management;Direct supervision/assist for medications management;Help with stairs or ramp for entrance   Can travel by private vehicle        Equipment Recommendations None recommended by PT  Recommendations for Other Services       Functional Status Assessment Patient has had a recent decline in their functional status and demonstrates the ability to make significant improvements in function in a reasonable and predictable amount of time.     Precautions / Restrictions Precautions Precautions: Fall Recall of Precautions/Restrictions: Intact Precaution/Restrictions Comments: R-hemiplegia; Dysarthric Restrictions Weight Bearing  Restrictions Per Provider Order: No      Mobility  Bed Mobility Overal bed mobility: Needs Assistance Bed Mobility: Supine to Sit, Sit to Supine     Supine to sit: Contact guard Sit to supine: Contact guard assist        Transfers Overall transfer level: Needs assistance Equipment used: Straight cane Transfers: Sit to/from Stand Sit to Stand: Contact guard assist                Ambulation/Gait Ambulation/Gait assistance: Min assist Gait Distance (Feet): 50 Feet Assistive device: Straight cane Gait Pattern/deviations: Step-to pattern, Decreased stride length Gait velocity: decreased     General Gait Details: R foot externally rotated throughout. Assist for balance as patient not as comfortable with SPC. Wife reports ambulation seems harder since being here  Careers Information Officer     Tilt Bed    Modified Rankin (Stroke Patients Only)       Balance Overall balance assessment: Needs assistance Sitting-balance support: No upper extremity supported, Feet supported Sitting balance-Leahy Scale: Good     Standing balance support: Single extremity supported, During functional activity Standing balance-Leahy Scale: Fair                               Pertinent Vitals/Pain Pain Assessment Pain Assessment: No/denies pain    Home Living Family/patient expects to be discharged to:: Private residence Living Arrangements: Spouse/significant other Available Help at Discharge: Family;Available 24 hours/day Type of Home: House Home Access: Stairs to enter Entrance Stairs-Rails: None Entrance Stairs-Number of Steps: 2   Home Layout: Two level;Able to live on main level with bedroom/bathroom;Full bath on main level  Home Equipment: Cane - quad;Wheelchair - manual      Prior Function Prior Level of Function : Needs assist             Mobility Comments: requires assistance with ambulating using quad cane. Uses w/c for  household mobility when wife is not present       Extremity/Trunk Assessment   Upper Extremity Assessment Upper Extremity Assessment: Defer to OT evaluation    Lower Extremity Assessment Lower Extremity Assessment: RLE deficits/detail RLE Deficits / Details: Grossly 2+/5 in R LE; R AFO use for ambulation       Communication   Communication Communication: Impaired Factors Affecting Communication: Reduced clarity of speech    Cognition Arousal: Alert Behavior During Therapy: WFL for tasks assessed/performed, Flat affect   PT - Cognitive impairments: No apparent impairments                         Following commands: Intact       Cueing       General Comments      Exercises     Assessment/Plan    PT Assessment Patient needs continued PT services  PT Problem List Decreased range of motion;Decreased strength;Decreased balance;Decreased activity tolerance;Decreased mobility;Decreased coordination;Pain       PT Treatment Interventions DME instruction;Gait training;Functional mobility training;Therapeutic activities;Therapeutic exercise;Balance training;Stair training;Neuromuscular re-education;Patient/family education    PT Goals (Current goals can be found in the Care Plan section)  Acute Rehab PT Goals Patient Stated Goal: to go home PT Goal Formulation: With patient Time For Goal Achievement: 05/09/24 Potential to Achieve Goals: Good    Frequency Min 2X/week     Co-evaluation               AM-PAC PT 6 Clicks Mobility  Outcome Measure Help needed turning from your back to your side while in a flat bed without using bedrails?: A Little Help needed moving from lying on your back to sitting on the side of a flat bed without using bedrails?: A Little Help needed moving to and from a bed to a chair (including a wheelchair)?: A Little Help needed standing up from a chair using your arms (e.g., wheelchair or bedside chair)?: A Little Help  needed to walk in hospital room?: A Little Help needed climbing 3-5 steps with a railing? : A Lot 6 Click Score: 17    End of Session   Activity Tolerance: Patient tolerated treatment well Patient left: in bed;with call bell/phone within reach;with bed alarm set;with family/visitor present Nurse Communication: Mobility status PT Visit Diagnosis: Unsteadiness on feet (R26.81);Other abnormalities of gait and mobility (R26.89);Muscle weakness (generalized) (M62.81);Difficulty in walking, not elsewhere classified (R26.2)    Time: 8575-8554 PT Time Calculation (min) (ACUTE ONLY): 21 min   Charges:   PT Evaluation $PT Eval Moderate Complexity: 1 Mod   PT General Charges $$ ACUTE PT VISIT: 1 Visit         Maryanne Finder, PT, DPT Physical Therapist - [facility]  [facility]   Pierina Schuknecht A Malayiah Mcbrayer 04/25/2024, 4:06 PM

## 2024-04-25 NOTE — Progress Notes (Signed)
 [facility]  - [place] [facility]   PATIENT NAME: Isaac Meza    MR#:  [id number]  DATE OF BIRTH:  12-19-1970  SUBJECTIVE:  patient seen in the ER earlier. Wife at bedside. Got recently discharged from [facility] inpatient rehab after stroke. Patient was discharged in first week of January was doing well at home started having cough and hemoptysis. Came to the emergency room found to have pneumonia. Patient was on aspirin  and brilinta     VITALS:  Blood pressure (!) 127/97, pulse 95, temperature 98.5 F (36.9 C), temperature source Oral, resp. rate 11, height 5' 8 (1.727 m), weight 59 kg, SpO2 96%.  PHYSICAL EXAMINATION:   GENERAL:  54 y.o.-year-old patient with no acute distress. weak LUNGS: decreased breath sounds bilaterally, no wheezing CARDIOVASCULAR: S1, S2 normal. No murmur   ABDOMEN: Soft, nontender, nondistended. Bowel sounds present.  EXTREMITIES: No  edema b/l.    NEUROLOGIC: right UE and LE hemiparesis  patient is alert and awake SKIN: No obvious rash, lesion, or ulcer.   LABORATORY PANEL:  CBC Recent Labs  Lab 04/24/24 1544  WBC 16.4*  HGB 11.2*  HCT 32.7*  PLT 413*    Chemistries  Recent Labs  Lab 04/23/24 0120 04/24/24 1544  NA  --  133*  K  --  4.0  CL  --  94*  CO2  --  23  GLUCOSE  --  94  BUN  --  16  CREATININE  --  0.67  CALCIUM   --  9.7  MG  --  1.9  AST 15  --   ALT 8  --   ALKPHOS 107  --   BILITOT 0.4  --    Cardiac Enzymes No results for input(s): TROPONINI in the last 168 hours. RADIOLOGY:  DG Chest 2 View Result Date: 04/24/2024 CLINICAL DATA:  Shortness of breath. EXAM: CHEST - 2 VIEW COMPARISON:  04/22/2024 04/23/2024. FINDINGS: The heart size and mediastinal contours are within normal limits. There is chronic elevation of the left diaphragm with stable consolidation in the left lower lobe. Mild atelectasis is present at the right lung base. There is a suspected small left pleural effusion. No  pneumothorax is seen bilaterally. Old healed rib fractures are noted bilaterally. Compression deformities with vertebroplasty changes in the upper thoracic spine appear stable. IMPRESSION: 1. Stable consolidation in the left lower lobe. 2. Suspected small left pleural effusion. Electronically Signed   By: Leita Birmingham M.D.   On: 04/24/2024 17:31    Assessment and Plan Isaac Meza is a 54 y.o. year old male with medical history of hypertension, hyperlipidemia, alcohol use disorder, tobacco use disorder presenting to the ED with coughing, and hemoptysis.  Patient presented to the ED yesterday and was found that he had pneumonia and was discharged on oral antibiotics.  He was started on amoxicillin .  Given persistent symptoms patient is here today.   Chest x-ray showed left lower lobe consolidation.   Community acquired pneumonia Hemoptysis -- patient symptoms are improving. Hemoglobin stable. -- Continue broad-spectrum antibiotics -- PRN cough meds -- trend CBC -- will resume aspirin  (recent stroke). Completed 4 weeks of Brilinta  from 12/192025 as per [facility] neurology recs --hgb stable -- consider pulmonary consultation if needed -- respiratory panel negative  History of CVA recent December 2025 status post inpatient rehab at [facility] -- patient completed course of Brilinta  as above. Now cont asa 81 mg every day -- PT OT to see patient  Hyperlipidemia -- continue  statin  History of hypertension -- resume lisinopril  once blood pressure more stable   Continue to monitor. If remains stable discharge in 1 to 2 days to home with finishing antibiotic course  Procedures: Family communication : wife Consults : none CODE STATUS: full DVT Prophylaxis : SCD Level of care: Telemetry Status is: Inpatient Remains inpatient appropriate because: treating for pneumonia    TOTAL TIME TAKING CARE OF THIS PATIENT: 40 minutes.  >50% time spent on counselling and coordination of care  Note:  This dictation was prepared with Dragon dictation along with smaller phrase technology. Any transcriptional errors that result from this process are unintentional.  Leita Blanch M.D    [facility]   CC: Primary care physician; [name], DO

## 2024-04-26 ENCOUNTER — Other Ambulatory Visit: Payer: Self-pay | Admitting: Internal Medicine

## 2024-04-26 ENCOUNTER — Other Ambulatory Visit: Payer: Self-pay

## 2024-04-26 ENCOUNTER — Ambulatory Visit

## 2024-04-26 ENCOUNTER — Ambulatory Visit: Admitting: Occupational Therapy

## 2024-04-26 LAB — GLUCOSE, CAPILLARY: Glucose-Capillary: 75 mg/dL (ref 70–99)

## 2024-04-26 MED ORDER — AMOXICILLIN-POT CLAVULANATE 875-125 MG PO TABS
1.0000 | ORAL_TABLET | Freq: Two times a day (BID) | ORAL | 0 refills | Status: AC
  Filled 2024-04-26: qty 4, 2d supply, fill #0

## 2024-04-26 MED ORDER — DOXYCYCLINE HYCLATE 100 MG PO TABS: 100.0000 mg | ORAL_TABLET | Freq: Two times a day (BID) | ORAL | Status: DC

## 2024-04-26 MED ORDER — SODIUM CHLORIDE 0.9 % IV SOLN
2.0000 g | Freq: Once | INTRAVENOUS | Status: AC
  Administered 2024-04-26: 2 g via INTRAVENOUS
  Filled 2024-04-26: qty 20

## 2024-04-26 NOTE — Progress Notes (Signed)
 PHARMACIST - PHYSICIAN COMMUNICATION  CONCERNING: Antibiotic IV to Oral Route Change Policy  RECOMMENDATION: This patient is receiving doxycycline  by the intravenous route.  Based on criteria approved by the Pharmacy and Therapeutics Committee, the antibiotic(s) is/are being converted to the equivalent oral dose form(s).  DESCRIPTION: These criteria include: Patient being treated for a respiratory tract infection, urinary tract infection, cellulitis or clostridium difficile associated diarrhea if on metronidazole  The patient is not neutropenic and does not exhibit a GI malabsorption state The patient is eating (either orally or via tube) and/or has been taking other orally administered medications for a least 24 hours The patient is improving clinically and has a Tmax < 100.5  If you have questions about this conversion, please contact the Pharmacy Department  []   [phone] )  Isaac Meza [x]   [phone] )  [facility] []   [phone] )  Isaac Meza []   [phone] )  [facility] []   [phone] )  [facility]    Thank you for involving pharmacy in this patient's care.   Damien Napoleon, PharmD Clinical Pharmacist 04/26/2024 10:05 AM

## 2024-04-26 NOTE — Progress Notes (Signed)
 Physical Therapy Treatment Patient Details Name: Isaac Meza MRN: [id number] DOB: 09-14-1970 Today's Date: 04/26/2024   History of Present Illness 54 y/o male presented to ED on 04/24/24 for coughing and hemoptysis. Admitted for community acquired pneumonia. PMH: HTN, alcohol use disorder, tobacco use disorder, hx of L CVA.    PT Comments  Pt received up in the chair, L UE IV infiltrated, nursing called in to address. Pt completed LE P/AA/AROM exercises while sitting, Right heel cord stretch prior to donning AFO and shoes with assist. Sit>stand with CGA, Gait training in hallway with SBQC, CGA, slow gait with increased fatigue due to generalized weakness and current illness. Will continue to recommend Out-pt PT once medically cleared.   If plan is discharge home, recommend the following: A little help with walking and/or transfers;A little help with bathing/dressing/bathroom;Assistance with cooking/housework;Assist for transportation;Direct supervision/assist for financial management;Direct supervision/assist for medications management;Help with stairs or ramp for entrance   Can travel by private vehicle        Equipment Recommendations  None recommended by PT    Recommendations for Other Services       Precautions / Restrictions Precautions Precautions: Fall Recall of Precautions/Restrictions: Intact Precaution/Restrictions Comments: R-hemiplegia; Dysarthric Required Braces or Orthoses: Other Brace (R AFO) Restrictions Weight Bearing Restrictions Per Provider Order: No     Mobility  Bed Mobility Overal bed mobility: Needs Assistance Bed Mobility: Sit to Supine       Sit to supine: Min assist (for R LE)        Transfers Overall transfer level: Needs assistance Equipment used: Quad cane Transfers: Sit to/from Stand Sit to Stand: Contact guard assist           General transfer comment:  (Heavy reliance on L UE to power up to standing from chair)     Ambulation/Gait Ambulation/Gait assistance: Contact guard assist Gait Distance (Feet): 75 Feet Assistive device: Quad cane Gait Pattern/deviations: Step-to pattern, Decreased stride length, Ataxic Gait velocity: decreased     General Gait Details:  (Weaker and slightly unsteady due to recent illness, recommending Out-pt PT)   Stairs             Wheelchair Mobility     Tilt Bed    Modified Rankin (Stroke Patients Only)       Balance Overall balance assessment: Needs assistance Sitting-balance support: No upper extremity supported, Feet supported Sitting balance-Leahy Scale: Good     Standing balance support: Single extremity supported, During functional activity, Reliant on assistive device for balance Standing balance-Leahy Scale: Fair                              Hotel Manager: Impaired Factors Affecting Communication: Reduced clarity of speech  Cognition Arousal: Alert Behavior During Therapy: WFL for tasks assessed/performed, Flat affect   PT - Cognitive impairments: No apparent impairments                         Following commands: Intact      Cueing Cueing Techniques: Verbal cues  Exercises General Exercises - Lower Extremity Ankle Circles/Pumps: PROM, Right, AROM, Left, 10 reps Long Arc Quad: AAROM, Right, AROM, Left, 10 reps    General Comments General comments (skin integrity, edema, etc.): L UE IV infiltrated as Doxy was running, Nursing/MD notified      Pertinent Vitals/Pain Pain Assessment Pain Assessment: No/denies pain    Home Living Family/patient expects  to be discharged to:: Private residence Living Arrangements: Spouse/significant other Available Help at Discharge: Family;Available 24 hours/day Type of Home: House Home Access: Stairs to enter Entrance Stairs-Rails: None Entrance Stairs-Number of Steps: 2   Home Layout: Two level;Able to live on main level with  bedroom/bathroom;Full bath on main level Home Equipment: Cane - quad;Wheelchair - manual      Prior Function            PT Goals (current goals can now be found in the care plan section) Acute Rehab PT Goals Patient Stated Goal: to go home Progress towards PT goals: Progressing toward goals    Frequency    Min 2X/week      PT Plan      Co-evaluation              AM-PAC PT 6 Clicks Mobility   Outcome Measure  Help needed turning from your back to your side while in a flat bed without using bedrails?: A Little Help needed moving from lying on your back to sitting on the side of a flat bed without using bedrails?: A Little Help needed moving to and from a bed to a chair (including a wheelchair)?: A Little Help needed standing up from a chair using your arms (e.g., wheelchair or bedside chair)?: A Little Help needed to walk in hospital room?: A Little Help needed climbing 3-5 steps with a railing? : A Lot 6 Click Score: 17    End of Session Equipment Utilized During Treatment: Gait belt Activity Tolerance: Patient tolerated treatment well Patient left: in bed;with call bell/phone within reach;with bed alarm set;with family/visitor present Nurse Communication: Mobility status PT Visit Diagnosis: Unsteadiness on feet (R26.81);Other abnormalities of gait and mobility (R26.89);Muscle weakness (generalized) (M62.81);Difficulty in walking, not elsewhere classified (R26.2)     Time: 8861-8795 PT Time Calculation (min) (ACUTE ONLY): 26 min  Charges:    $Therapeutic Exercise: 8-22 mins $Therapeutic Activity: 8-22 mins PT General Charges $$ ACUTE PT VISIT: 1 Visit                    Darice Bohr, PTA  Darice JAYSON Bohr 04/26/2024, 1:37 PM

## 2024-04-26 NOTE — TOC Initial Note (Signed)
 Transition of Care [facility]) - Initial/Assessment Note    Patient Details  Name: Isaac Meza MRN: [id number] Date of Birth: 1971-01-12  [facility] CM/SW Contact:    Daved JONETTA Hamilton, RN Phone Number: 04/26/2024, 12:18 PM  Clinical Narrative:                  Met with patient, introduced self and explained role. Prior to this hospital admission, patient lives at home with spouse Dawn. Patient was attending OP PT/OT here at [facility] and patient would like to resume therapy once he is medically ready for discharge. Patient states that his wife Stephane will transport him home when ready.   No DME recommended by therapy at this time.   Expected Discharge Plan: OP Rehab Barriers to Discharge: Continued Medical Work up   Patient Goals and CMS Choice     Choice offered to / list presented to : Patient      Expected Discharge Plan and Services   Discharge Planning Services: CM Consult Post Acute Care Choice: Resumption of Svcs/PTA Provider Living arrangements for the past 2 months: Single Family Home                                      Prior Living Arrangements/Services Living arrangements for the past 2 months: Single Family Home Lives with:: Spouse Patient language and need for interpreter reviewed:: Yes Do you feel safe going back to the place where you live?: Yes      Need for Family Participation in Patient Care: Yes (Comment) Care giver support system in place?: Yes (comment)   Criminal Activity/Legal Involvement Pertinent to Current Situation/Hospitalization: No - Comment as needed  Activities of Daily Living   ADL Screening (condition at time of admission) Independently performs ADLs?: No Does the patient have a NEW difficulty with bathing/dressing/toileting/self-feeding that is expected to last >3 days?: No Does the patient have a NEW difficulty with getting in/out of bed, walking, or climbing stairs that is expected to last >3 days?:  No Does the patient have a NEW difficulty with communication that is expected to last >3 days?: No Is the patient deaf or have difficulty hearing?: No Does the patient have difficulty seeing, even when wearing glasses/contacts?: No Does the patient have difficulty concentrating, remembering, or making decisions?: No  Permission Sought/Granted                  Emotional Assessment Appearance:: Appears stated age, Well-Groomed Attitude/Demeanor/Rapport: Engaged Affect (typically observed): Appropriate Orientation: : Oriented to Self, Oriented to Place, Oriented to  Time, Oriented to Situation Alcohol / Substance Use: Not Applicable Psych Involvement: No (comment)  Admission diagnosis:  Community acquired pneumonia [J18.9] Community acquired pneumonia of left lower lobe of lung [J18.9] Patient Active Problem List   Diagnosis Date Noted   Community acquired pneumonia 04/24/2024   Tobacco use disorder 04/24/2024   Infarction of left basal ganglia (HCC) 03/28/2024   CVA (cerebral vascular accident) (HCC) 03/17/2024   Stroke (HCC) 03/17/2024   Epigastric abdominal pain 12/06/2022   Hypertensive urgency 12/06/2022   Alcohol use disorder 12/06/2022   Erosive gastropathy 12/06/2022   Erythrocytosis 12/06/2022   Protein-calorie malnutrition, severe 08/01/2022   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   Abnormal CT of the abdomen 07/30/2022   Pancreatic cyst 07/30/2022   Nausea and vomiting 07/30/2022   Chronic pancreatitis (HCC) 07/29/2022   Dehydration 07/29/2022  Duodenal obstruction 07/27/2022   Acute appendicitis 05/16/2022   Essential hypertension 04/30/2022   Long term current use of opiate analgesic 09/12/2020   Bilateral thoracic back pain 10/13/2017   PCP:  [name], DO Pharmacy:   [id number] [place], [place] - [address] AT [facility] OF [address] [place] [address] Phone: [phone] Fax:  [phone]  [facility] [address] [place] [address] Phone: [phone] Fax: [phone]     Social Drivers of Health (SDOH) Social History: SDOH Screenings   Food Insecurity: No Food Insecurity (04/25/2024)  Housing: Low Risk (04/25/2024)  Transportation Needs: No Transportation Needs (04/25/2024)  Utilities: Not At Risk (04/25/2024)  Depression (PHQ2-9): Low Risk (05/21/2023)  Social Connections: Socially Integrated (04/25/2024)  Tobacco Use: High Risk (04/22/2024)   SDOH Interventions:     Readmission Risk Interventions     No data to display

## 2024-04-26 NOTE — Evaluation (Signed)
 Occupational Therapy Evaluation Patient Details Name: Isaac Meza MRN: [id number] DOB: 07-Mar-1971 Today's Date: 04/26/2024   History of Present Illness   54 y/o male presented to ED on 04/24/24 for coughing and hemoptysis. Admitted for community acquired pneumonia. PMH: HTN, alcohol use disorder, tobacco use disorder, hx of L CVA.     Clinical Impressions Patient was seen for OT evaluation this date. 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 Patient presents in bed, agreeable to OT eval. Patient reports he is close to baseline for ADLs and short distance ambulation, reports feeling much better today compared to yesterday and is eager to dc home. Patient demonstrated bed mobility/transfers with quad cane with CGA. He requires min A for ADLs due to hemiplegia in RUE (dominant) Patient presents with deficits in overall activity tolerance, affecting safe and optimal ADL completion. Patient is currently requiring min A for ADLs.  Patient would benefit from skilled OT services to address noted impairments and functional limitations (see below for any additional details) in order to maximize safety and independence while minimizing future risk of falls, injury, and readmission.  Anticipate the need for follow up OT services upon acute hospital DC.        If plan is discharge home, recommend the following:   A little help with walking and/or transfers;A little help with bathing/dressing/bathroom;Assist for transportation     Functional Status Assessment   Patient has had a recent decline in their functional status and demonstrates the ability to make significant improvements in function in a reasonable and predictable amount of time.     Equipment Recommendations   None recommended  by OT     Recommendations for Other Services         Precautions/Restrictions   Precautions Precautions: Fall Recall of Precautions/Restrictions: Intact Precaution/Restrictions Comments: R-hemiplegia; Dysarthric Restrictions Weight Bearing Restrictions Per Provider Order: No     Mobility Bed Mobility Overal bed mobility: Needs Assistance Bed Mobility: Supine to Sit, Sit to Supine     Supine to sit: Contact guard Sit to supine: Contact guard assist        Transfers Overall transfer level: Needs assistance Equipment used: Quad cane Transfers: Bed to chair/wheelchair/BSC Sit to Stand: Contact guard assist     Step pivot transfers: Contact guard assist            Balance Overall balance assessment: Needs assistance Sitting-balance support: No upper extremity supported, Feet supported Sitting balance-Leahy Scale: Good     Standing balance support: Single extremity supported, During functional activity Standing balance-Leahy Scale: Fair                             ADL either performed or assessed with clinical judgement   ADL Overall ADL's : Needs assistance/impaired                                       General ADL Comments: min A for UB dressing/bathing; min A for LB/bahting/dressing     Vision         Perception         Praxis  Pertinent Vitals/Pain Pain Assessment Pain Assessment: No/denies pain     Extremity/Trunk Assessment Upper Extremity Assessment Upper Extremity Assessment: RUE deficits/detail RUE Deficits / Details: hemiplegia   Lower Extremity Assessment Lower Extremity Assessment: Defer to PT evaluation       Communication Communication Communication: Impaired Factors Affecting Communication: Reduced clarity of speech   Cognition Arousal: Alert Behavior During Therapy: WFL for tasks assessed/performed, Flat affect Cognition: No apparent impairments                                Following commands: Intact       Cueing  General Comments   Cueing Techniques: Verbal cues      Exercises     Shoulder Instructions      Home Living Family/patient expects to be discharged to:: Private residence Living Arrangements: Spouse/significant other Available Help at Discharge: Family;Available 24 hours/day Type of Home: House Home Access: Stairs to enter [employer] of Steps: 2 Entrance Stairs-Rails: None Home Layout: Two level;Able to live on main level with bedroom/bathroom;Full bath on main level     Bathroom Shower/Tub: Tub/shower unit;Curtain   Bathroom Toilet: Standard Bathroom Accessibility: Yes How Accessible: Accessible via walker Home Equipment: Cane - quad;Wheelchair - manual          Prior Functioning/Environment Prior Level of Function : Needs assist             Mobility Comments: requires assistance with ambulating using quad cane. Uses w/c for household mobility when wife is not present ADLs Comments: wife provides A for bathing/dressing tasks as needed    OT Problem List: Decreased range of motion;Decreased activity tolerance;Decreased strength;Impaired balance (sitting and/or standing);Decreased coordination   OT Treatment/Interventions: Self-care/ADL training;Therapeutic exercise;DME and/or AE instruction;Therapeutic activities;Patient/family education;Balance training      OT Goals(Current goals can be found in the care plan section)   Acute Rehab OT Goals Patient Stated Goal: to go home OT Goal Formulation: With patient Time For Goal Achievement: 05/10/24 Potential to Achieve Goals: Good ADL Goals Pt Will Perform Grooming: with supervision;standing Pt Will Perform Lower Body Dressing: with min assist;sit to/from stand Pt Will Transfer to Toilet: ambulating;grab bars Pt Will Perform Toileting - Clothing Manipulation and hygiene: with contact guard assist;sit to/from stand   OT Frequency:  Min  2X/week    Co-evaluation              AM-PAC OT 6 Clicks Daily Activity     Outcome Measure Help from another person eating meals?: A Little Help from another person taking care of personal grooming?: A Little Help from another person toileting, which includes using toliet, bedpan, or urinal?: A Little Help from another person bathing (including washing, rinsing, drying)?: A Little Help from another person to put on and taking off regular upper body clothing?: A Little Help from another person to put on and taking off regular lower body clothing?: A Little 6 Click Score: 18   End of Session Equipment Utilized During Treatment: Other (comment) (quad cane) Nurse Communication: Mobility status  Activity Tolerance: Patient tolerated treatment well Patient left: in chair;with call bell/phone within reach;with chair alarm set  OT Visit Diagnosis: Unsteadiness on feet (R26.81);Other abnormalities of gait and mobility (R26.89);Hemiplegia and hemiparesis Hemiplegia - Right/Left: Right Hemiplegia - dominant/non-dominant: Dominant Hemiplegia - caused by: Cerebral infarction                Time: 0939-1000 OT Time Calculation (min): 21  min Charges:  OT General Charges $OT Visit: 1 Visit OT Evaluation $OT Eval Low Complexity: 1 Low OT Treatments $Self Care/Home Management : 8-22 mins  Rogers Clause, OT/L [facility], 04/26/2024

## 2024-04-26 NOTE — Plan of Care (Signed)

## 2024-04-26 NOTE — Plan of Care (Signed)

## 2024-04-26 NOTE — Discharge Summary (Signed)
 "  Physician Discharge Summary   Isaac Meza  male DOB: 1971-03-11  FMW:[id number]  PCP: [name], DO  Admit date: 04/24/2024 Discharge date: 04/26/2024  Admitted From: home Disposition:  home CODE STATUS: Full code   Hospital Course:  For full details, please see H&P, progress notes, consult notes and ancillary notes.  Briefly,  Isaac Meza is a 54 y.o. year old male with medical history of hypertension, hyperlipidemia, alcohol use disorder, tobacco use disorder presenting to the ED with coughing, and hemoptysis.    Patient presented to the ED the day prior and was found to have pneumonia and was discharged on amoxicillin .  Given persistent symptoms patient presented back to the ED.   Chest x-ray showed left lower lobe consolidation.    Community acquired pneumonia Hemoptysis --hemoptysis likely due to being on ASA and Brilinta  due to recent stroke. --pt was started on ceftriaxone  and doxy, with rapid improvement of symptoms and asked to be discharged.  Pt received 3 days of ceftriaxone  and doxy, and discharged on 2 more days of Augmentin .  History of CVA recent December 2025  status post inpatient rehab at [facility] --Pt completed 4 weeks of Brilinta  from 12/192025 as per [facility] neurology recs. --cont ASA 81 mg daily. --cont statin   Hyperlipidemia -- continue statin   History of hypertension -- resume home lisinopril  after discharge.   Unless noted above, medications under STOP list are ones pt was not taking PTA.  Discharge Diagnoses:  Principal Problem:   Community acquired pneumonia Active Problems:   Essential hypertension   Alcohol use disorder   CVA (cerebral vascular accident) (HCC)   Tobacco use disorder   30 Day Unplanned Readmission Risk Score    Flowsheet Row ED to Hosp-Admission (Current) from 04/24/2024 in [facility] (1A)  30 Day Unplanned Readmission Risk Score (%) 19.54 Filed at 04/26/2024 1200     This score is the patient's risk of an unplanned readmission within 30 days of being discharged (0 -100%). The score is based on dignosis, age, lab data, medications, orders, and past utilization.   Low:  0-14.9   Medium: 15-21.9   High: 22-29.9   Extreme: 30 and above         Discharge Instructions:  Allergies as of 04/26/2024   No Known Allergies      Medication List     STOP taking these medications    amoxicillin  500 MG capsule Commonly known as: AMOXIL    Brilinta  90 MG Tabs tablet Generic drug: ticagrelor    docusate sodium  100 MG capsule Commonly known as: COLACE   nicotine  polacrilex 2 MG gum Commonly known as: NICORETTE        TAKE these medications    acetaminophen  325 MG tablet Commonly known as: TYLENOL  Take 2 tablets (650 mg total) by mouth every 6 (six) hours as needed for mild pain (pain score 1-3) or moderate pain (pain score 4-6).   amoxicillin -clavulanate 875-125 MG tablet Commonly known as: AUGMENTIN  Take 1 tablet by mouth 2 (two) times daily for 2 days. Start taking on: April 27, 2024   aspirin  EC 81 MG tablet Take 1 tablet (81 mg total) by mouth daily. Swallow whole.   baclofen  10 MG tablet Commonly known as: LIORESAL  Take 1 tablet (10 mg total) by mouth 3 (three) times daily.   folic acid  1 MG tablet Commonly known as: FOLVITE  Take 1 tablet (1 mg total) by mouth daily.   lisinopril  5 MG tablet Commonly known as:  ZESTRIL  Take 1 tablet (5 mg total) by mouth daily.   multivitamin with minerals Tabs tablet Take 1 tablet by mouth daily.   oxyCODONE  5 MG immediate release tablet Commonly known as: Oxy IR/ROXICODONE  Take 1 tablet (5 mg total) by mouth every 6 (six) hours as needed for severe pain (pain score 7-10).   rosuvastatin  20 MG tablet Commonly known as: CRESTOR  Take 1 tablet (20 mg total) by mouth daily.   thiamine  100 MG tablet Commonly known as: VITAMIN B1 Take 1 tablet (100 mg total) by mouth daily.          Follow-up Information     [name], DO Follow up in 1 week(s).   Specialty: Internal Medicine Contact information: [address] [place] [address] [phone]                 Allergies[1]   The results of significant diagnostics from this hospitalization (including imaging, microbiology, ancillary and laboratory) are listed below for reference.   Consultations:   Procedures/Studies: DG Chest 2 View Result Date: 04/24/2024 CLINICAL DATA:  Shortness of breath. EXAM: CHEST - 2 VIEW COMPARISON:  04/22/2024 04/23/2024. FINDINGS: The heart size and mediastinal contours are within normal limits. There is chronic elevation of the left diaphragm with stable consolidation in the left lower lobe. Mild atelectasis is present at the right lung base. There is a suspected small left pleural effusion. No pneumothorax is seen bilaterally. Old healed rib fractures are noted bilaterally. Compression deformities with vertebroplasty changes in the upper thoracic spine appear stable. IMPRESSION: 1. Stable consolidation in the left lower lobe. 2. Suspected small left pleural effusion. Electronically Signed   By: Leita Birmingham M.D.   On: 04/24/2024 17:31   CT Angio Chest Pulmonary Embolism (PE) W or WO Contrast Result Date: 04/23/2024 EXAM: CTA CHEST 04/23/2024 01:36:19 AM TECHNIQUE: CTA of the chest was performed without and with the administration of 75 mL of intravenous iohexol  (OMNIPAQUE ) 350 MG/ML injection. Multiplanar reformatted images are provided for review. MIP images are provided for review. Automated exposure control, iterative reconstruction, and/or weight based adjustment of the mA/kV was utilized to reduce the radiation dose to as low as reasonably achievable. COMPARISON: Chest x-ray today. CLINICAL HISTORY: Pulmonary embolism (PE) suspected, high prob. FINDINGS: PULMONARY ARTERIES: Pulmonary arteries are adequately opacified for evaluation. No acute pulmonary  embolus. Main pulmonary artery is normal in caliber. MEDIASTINUM: The heart and pericardium demonstrate no acute abnormality. There is no acute abnormality of the thoracic aorta. LYMPH NODES: No mediastinal, hilar or axillary lymphadenopathy. LUNGS AND PLEURA: Airspace disease in the left lower lobe with air bronchograms concerning for pneumonia. Less pronounced right basilar airspace opacity, favored to represent atelectasis. No effusions. No pneumothorax. UPPER ABDOMEN: Limited images of the upper abdomen are unremarkable. SOFT TISSUES AND BONES: Kyphosis centered in the mid Thoracic spine at the level of chronic compression fractures and vertebroplasty changes. No acute soft tissue abnormality. IMPRESSION: 1. No pulmonary embolism. 2. Left lower lobe airspace disease concerning for pneumonia. 3. Right basilar airspace opacity, favored to represent atelectasis. Electronically signed by: Franky Crease MD 04/23/2024 01:45 AM EST RP Workstation: [id number]   DG Chest 2 View Result Date: 04/22/2024 EXAM: 2 VIEW(S) XRAY OF THE CHEST 04/22/2024 08:57:00 PM COMPARISON: 04/01/2024 CLINICAL HISTORY: hemoptysis hemoptysis hemoptysis FINDINGS: LUNGS AND PLEURA: Linear scarring in right lower lung zone, unchanged. Linear and hazy opacity at left lung base. Elevated left hemidiaphragm. No pleural effusion. No pneumothorax. HEART AND MEDIASTINUM: No acute abnormality  of the cardiac and mediastinal silhouettes. BONES AND SOFT TISSUES: Vertebral augmentation in upper thoracic spine. Chronic bilateral rib fractures. IMPRESSION: 1. Linear and hazy opacity at the left lung base, favored atelectasis though infection is not excluded, and consider follow-up chest radiograph to document resolution. Electronically signed by: Oneil Devonshire MD 04/22/2024 09:03 PM EST RP Workstation: [id number]   VAS US  LOWER EXTREMITY VENOUS (DVT) Result Date: 04/03/2024  Lower Venous DVT Study Patient Name:  ANTHANY Teasdale  Date of Exam:   04/01/2024  Medical Rec #: [id number]        Accession #:    [id number] Date of Birth: 06-22-1970        Patient Gender: M Patient Age:   54 years Exam Location:  [facility] Procedure:      VAS US  LOWER EXTREMITY VENOUS (DVT) Referring Phys: JOESPH LIKES --------------------------------------------------------------------------------  Indications: Right lateral thigh pain.  Risk Factors: Recent CVA, now in the rehabilitation unit. Comparison Study: No prior study Performing Technologist: Alberta Lis RVS  Examination Guidelines: A complete evaluation includes B-mode imaging, spectral Doppler, color Doppler, and power Doppler as needed of all accessible portions of each vessel. Bilateral testing is considered an integral part of a complete examination. Limited examinations for reoccurring indications may be performed as noted. The reflux portion of the exam is performed with the patient in reverse Trendelenburg.  +---------+---------------+---------+-----------+----------+--------------+ RIGHT    CompressibilityPhasicitySpontaneityPropertiesThrombus Aging +---------+---------------+---------+-----------+----------+--------------+ CFV      Full           Yes      Yes                                 +---------+---------------+---------+-----------+----------+--------------+ SFJ      Full                                                        +---------+---------------+---------+-----------+----------+--------------+ FV Prox  Full                                                        +---------+---------------+---------+-----------+----------+--------------+ FV Mid   Full                                                        +---------+---------------+---------+-----------+----------+--------------+ FV DistalFull                                                        +---------+---------------+---------+-----------+----------+--------------+ PFV      Full                                                         +---------+---------------+---------+-----------+----------+--------------+  POP      Full           No       Yes                                 +---------+---------------+---------+-----------+----------+--------------+ PTV      Full                                                        +---------+---------------+---------+-----------+----------+--------------+ PERO     Full                                                        +---------+---------------+---------+-----------+----------+--------------+   +----+---------------+---------+-----------+----------+--------------+ LEFTCompressibilityPhasicitySpontaneityPropertiesThrombus Aging +----+---------------+---------+-----------+----------+--------------+ CFV Full           Yes      Yes                                 +----+---------------+---------+-----------+----------+--------------+ SFJ Full                                                        +----+---------------+---------+-----------+----------+--------------+    Summary: RIGHT: - There is no evidence of deep vein thrombosis in the lower extremity.  - No cystic structure found in the popliteal fossa.  LEFT: - No evidence of common femoral vein obstruction.   *See table(s) above for measurements and observations. Electronically signed by Debby Robertson on 04/03/2024 at 10:59:45 AM.    Final    DG CHEST PORT 1 VIEW Result Date: 04/02/2024 EXAM: 1 VIEW(S) XRAY OF THE CHEST 04/01/2024 11:47:14 AM COMPARISON: None available. CLINICAL HISTORY: 220523 Productive cough 220523 FINDINGS: LUNGS AND PLEURA: Elevated left hemidiaphragm. Bibasilar scarring or atelectasis. No focal pulmonary opacity. No pleural effusion. No pneumothorax. HEART AND MEDIASTINUM: No acute abnormality of the cardiac and mediastinal silhouettes. BONES AND SOFT TISSUES: T4 vertebroplasty. Old healed bilateral rib fractures. IMPRESSION: 1. No acute  cardiopulmonary abnormality. 2. Elevated left hemidiaphragm with bibasilar scarring or atelectasis. Electronically signed by: Greig Pique MD 04/02/2024 12:40 AM EST RP Workstation: [id number]      Labs: BNP (last 3 results) No results for input(s): BNP in the last 8760 hours. Basic Metabolic Panel: Recent Labs  Lab 04/22/24 2049 04/24/24 1544  NA 133* 133*  K 4.1 4.0  CL 96* 94*  CO2 25 23  GLUCOSE 109* 94  BUN 12 16  CREATININE 0.82 0.67  CALCIUM  9.1 9.7  MG  --  1.9   Liver Function Tests: Recent Labs  Lab 04/23/24 0120  AST 15  ALT 8  ALKPHOS 107  BILITOT 0.4  PROT 7.5  ALBUMIN 4.1   No results for input(s): LIPASE, AMYLASE in the last 168 hours. No results for input(s): AMMONIA in the last 168 hours. CBC: Recent Labs  Lab 04/22/24 2049 04/24/24 1544  WBC 16.7* 16.4*  NEUTROABS  --  13.2*  HGB 12.9* 11.2*  HCT 37.9* 32.7*  MCV 93.8 91.6  PLT 454* 413*   Cardiac Enzymes: No results for input(s): CKTOTAL, CKMB, CKMBINDEX, TROPONINI in the last 168 hours. BNP: Invalid input(s): POCBNP CBG: Recent Labs  Lab 04/25/24 0733 04/26/24 0817  GLUCAP 58* 75   D-Dimer No results for input(s): DDIMER in the last 72 hours. Hgb A1c No results for input(s): HGBA1C in the last 72 hours. Lipid Profile No results for input(s): CHOL, HDL, LDLCALC, TRIG, CHOLHDL, LDLDIRECT in the last 72 hours. Thyroid function studies No results for input(s): TSH, T4TOTAL, T3FREE, THYROIDAB in the last 72 hours.  Invalid input(s): FREET3 Anemia work up No results for input(s): VITAMINB12, FOLATE, FERRITIN, TIBC, IRON, RETICCTPCT in the last 72 hours. Urinalysis    Component Value Date/Time   COLORURINE YELLOW 03/23/2024 1246   APPEARANCEUR HAZY (A) 03/23/2024 1246   LABSPEC 1.025 03/23/2024 1246   PHURINE 5.0 03/23/2024 1246   GLUCOSEU NEGATIVE 03/23/2024 1246   HGBUR NEGATIVE 03/23/2024 1246   BILIRUBINUR NEGATIVE  03/23/2024 1246   KETONESUR NEGATIVE 03/23/2024 1246   PROTEINUR 30 (A) 03/23/2024 1246   NITRITE NEGATIVE 03/23/2024 1246   LEUKOCYTESUR NEGATIVE 03/23/2024 1246   Sepsis Labs Recent Labs  Lab 04/22/24 2049 04/24/24 1544  WBC 16.7* 16.4*   Microbiology Recent Results (from the past 240 hours)  Blood culture (routine x 2)     Status: None (Preliminary result)   Collection Time: 04/23/24  2:27 AM   Specimen: BLOOD RIGHT HAND  Result Value Ref Range Status   Specimen Description BLOOD RIGHT HAND  Final   Special Requests   Final    BOTTLES DRAWN AEROBIC AND ANAEROBIC Blood Culture adequate volume   Culture   Final    NO GROWTH 3 DAYS Performed at [facility], [address], [place], [place] [address]    Report Status PENDING  Incomplete  Blood culture (routine x 2)     Status: None (Preliminary result)   Collection Time: 04/23/24  2:27 AM   Specimen: BLOOD  Result Value Ref Range Status   Specimen Description BLOOD LEFT ANTECUBITAL  Final   Special Requests   Final    BOTTLES DRAWN AEROBIC AND ANAEROBIC Blood Culture adequate volume   Culture   Final    NO GROWTH 3 DAYS Performed at [facility], [address] [place], [place] [address]    Report Status PENDING  Incomplete  Resp panel by RT-PCR (RSV, Flu A&B, Covid) Anterior Nasal Swab     Status: None   Collection Time: 04/24/24  4:55 PM   Specimen: Anterior Nasal Swab  Result Value Ref Range Status   SARS Coronavirus 2 by RT PCR NEGATIVE NEGATIVE Final    Comment: (NOTE) SARS-CoV-2 target nucleic acids are NOT DETECTED.  The SARS-CoV-2 RNA is generally detectable in upper respiratory specimens during the acute phase of infection. The lowest concentration of SARS-CoV-2 viral copies this assay can detect is 138 copies/mL. A negative result does not preclude SARS-Cov-2 infection and should not be used as the sole basis for treatment or other patient management decisions. A negative result may occur  with  improper specimen collection/handling, submission of specimen other than nasopharyngeal swab, presence of viral mutation(s) within the areas targeted by this assay, and inadequate number of viral copies(<138 copies/mL). A negative result must be combined with clinical observations, patient history, and epidemiological information. The expected result is Negative.  Fact Sheet for Patients:  bloggercourse.com  Fact Sheet for Healthcare Providers:  seriousbroker.it  This test is no t yet approved or cleared by the [place]  FDA and  has been authorized for detection and/or diagnosis of SARS-CoV-2 by FDA under an Emergency Use Authorization (EUA). This EUA will remain  in effect (meaning this test can be used) for the duration of the COVID-19 declaration under Section 564(b)(1) of the Act, 21 U.S.C.section 360bbb-3(b)(1), unless the authorization is terminated  or revoked sooner.       Influenza A by PCR NEGATIVE NEGATIVE Final   Influenza B by PCR NEGATIVE NEGATIVE Final    Comment: (NOTE) The Xpert Xpress SARS-CoV-2/FLU/RSV plus assay is intended as an aid in the diagnosis of influenza from Nasopharyngeal swab specimens and should not be used as a sole basis for treatment. Nasal washings and aspirates are unacceptable for Xpert Xpress SARS-CoV-2/FLU/RSV testing.  Fact Sheet for Patients: bloggercourse.com  Fact Sheet for Healthcare Providers: seriousbroker.it  This test is not yet approved or cleared by the [place]  FDA and has been authorized for detection and/or diagnosis of SARS-CoV-2 by FDA under an Emergency Use Authorization (EUA). This EUA will remain in effect (meaning this test can be used) for the duration of the COVID-19 declaration under Section 564(b)(1) of the Act, 21 U.S.C. section 360bbb-3(b)(1), unless the authorization is terminated  or revoked.     Resp Syncytial Virus by PCR NEGATIVE NEGATIVE Final    Comment: (NOTE) Fact Sheet for Patients: bloggercourse.com  Fact Sheet for Healthcare Providers: seriousbroker.it  This test is not yet approved or cleared by the [place]  FDA and has been authorized for detection and/or diagnosis of SARS-CoV-2 by FDA under an Emergency Use Authorization (EUA). This EUA will remain in effect (meaning this test can be used) for the duration of the COVID-19 declaration under Section 564(b)(1) of the Act, 21 U.S.C. section 360bbb-3(b)(1), unless the authorization is terminated or revoked.  Performed at [facility], [address] [place], [place] [address]      Total time spend on discharging this patient, including the last patient exam, discussing the hospital stay, instructions for ongoing care as it relates to all pertinent caregivers, as well as preparing the medical discharge records, prescriptions, and/or referrals as applicable, is 35 minutes.    Ellouise Haber, MD  [facility] 04/26/2024, 1:18 PM       [1] No Known Allergies  "

## 2024-04-26 NOTE — Progress Notes (Signed)
 All discharge requirements met.

## 2024-04-27 ENCOUNTER — Encounter: Payer: Self-pay | Admitting: Pharmacy Technician

## 2024-04-27 NOTE — Telephone Encounter (Signed)
 ERROR

## 2024-04-28 ENCOUNTER — Encounter: Admitting: Registered Nurse

## 2024-04-28 LAB — CULTURE, BLOOD (ROUTINE X 2)
Culture: NO GROWTH
Culture: NO GROWTH
Special Requests: ADEQUATE
Special Requests: ADEQUATE

## 2024-05-03 ENCOUNTER — Ambulatory Visit: Admitting: Internal Medicine

## 2024-05-04 ENCOUNTER — Ambulatory Visit: Admitting: Internal Medicine

## 2024-05-04 ENCOUNTER — Encounter: Payer: Self-pay | Admitting: Internal Medicine

## 2024-05-04 ENCOUNTER — Other Ambulatory Visit: Payer: Self-pay

## 2024-05-04 VITALS — BP 120/84 | HR 93 | Temp 97.8°F | Resp 16 | Ht 68.0 in

## 2024-05-04 DIAGNOSIS — I6930 Unspecified sequelae of cerebral infarction: Secondary | ICD-10-CM | POA: Diagnosis not present

## 2024-05-04 DIAGNOSIS — E782 Mixed hyperlipidemia: Secondary | ICD-10-CM

## 2024-05-04 DIAGNOSIS — I10 Essential (primary) hypertension: Secondary | ICD-10-CM | POA: Diagnosis not present

## 2024-05-04 DIAGNOSIS — J189 Pneumonia, unspecified organism: Secondary | ICD-10-CM

## 2024-05-04 DIAGNOSIS — F1090 Alcohol use, unspecified, uncomplicated: Secondary | ICD-10-CM

## 2024-05-04 MED ORDER — ROSUVASTATIN CALCIUM 20 MG PO TABS: 20.0000 mg | ORAL_TABLET | Freq: Every day | ORAL | 1 refills | Status: AC

## 2024-05-04 MED ORDER — FOLIC ACID 1 MG PO TABS: 1.0000 mg | ORAL_TABLET | Freq: Every day | ORAL | 1 refills | Status: AC

## 2024-05-04 MED ORDER — LISINOPRIL 5 MG PO TABS: 5.0000 mg | ORAL_TABLET | Freq: Every day | ORAL | 1 refills | Status: AC

## 2024-05-04 MED ORDER — BACLOFEN 10 MG PO TABS: 10.0000 mg | ORAL_TABLET | Freq: Three times a day (TID) | ORAL | 1 refills | Status: AC

## 2024-05-04 MED ORDER — THIAMINE HCL 100 MG PO TABS: 100.0000 mg | ORAL_TABLET | Freq: Every day | ORAL | 1 refills | Status: AC

## 2024-05-04 NOTE — Progress Notes (Signed)
 "  Established Patient Office Visit  Subjective   Patient ID: Isaac Meza, male    DOB: [birth year]  Age: 54 y.o. MRN: [id number]  Chief Complaint  Patient presents with   Hospitalization Follow-up    HPI  Isaac Meza presents for follow up on chronic medical conditions.  Discussed the use of AI scribe software for clinical note transcription with the patient, who gave verbal consent to proceed.  History of Present Illness  Isaac Meza is a 54 year old male who presents for follow-up after hospitalization for community-acquired pneumonia.  He was recently hospitalized for community-acquired pneumonia complicated by hemoptysis and marked weakness, worse on the right. Hospital evaluation showed leukocytosis, anemia, a stable left lower lobe consolidation, and a small suspected left pleural effusion. He feels his breathing has improved since discharge.  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 He takes baclofen  three times daily, and his caregiver notes involuntary pulling back of his right arm. He is in outpatient therapy and has shown some functional gains, including improved thumb and leg movement.  He takes aspirin , atorvastatin 20 mg daily, and lisinopril  5 mg daily. His recent blood pressure was 120/84 mmHg. He has stopped smoking.  He has reduced but ongoing daily alcohol use. He takes thiamine  and folic acid  for alcohol-related neuropathy prevention. His caregiver reports his intake is now small in amount but still daily.  Hx of Pancreatic pseudocyst and chronic pancreatitis: -CT of the abdomen showed chronic pancreatic pseudocyst with fluid collection adjacent to the pancreatic head and duodenum 6/24 -EGD with EUS with aspiration of pseudocyst performed on 4/26 with erosive gastropathy noted.  -Currently on Protonix  40 mg -Following with G -MRCP 11/24 showing a decrease in size of pseudocyst,  supposed to repeat in 3 months, patient will call to schedule -Not taking Protonix  currently  Hypertension: -Medications: Lisinopril  5 mg -Previous Meds: HCTZ 12.5 mg discontinued while inpatient  -Checking BP at home (average): not checking currently  -Denies any SOB, CP, vision changes, LE edema or symptoms of hypotension -Family history of father with MI in 59's, uncle in 28's and brother in upper 45's   HLD: -Medications: Crestor  20 mg -Patient is compliant with medication and denies side effects -Last lipid panel: Lipid Panel     Component Value Date/Time   CHOL 139 03/18/2024 0611   CHOL 168 08/14/2022 0958   TRIG 116 03/18/2024 0611   HDL 69 03/18/2024 0611   HDL 40 08/14/2022 0958   CHOLHDL 2.0 03/18/2024 0611   VLDL 23 03/18/2024 0611   LDLCALC 47 03/18/2024 0611   LDLCALC 130 (H) 11/24/2023 0000   LABVLDL 13 08/14/2022 0958   Health Maintenance: -Blood work due -Colon cancer screening: Cologuard negative 2/24  Patient Active Problem List   Diagnosis Date Noted   Community acquired pneumonia 04/24/2024   Tobacco use disorder 04/24/2024   Infarction of left basal ganglia (HCC) 03/28/2024   CVA (cerebral vascular accident) (HCC) 03/17/2024   Stroke (HCC) 03/17/2024   Epigastric abdominal pain 12/06/2022   Hypertensive urgency 12/06/2022   Alcohol use disorder 12/06/2022   Erosive gastropathy 12/06/2022   Erythrocytosis 12/06/2022   Protein-calorie malnutrition, severe 08/01/2022   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   Abnormal CT of the abdomen 07/30/2022   Pancreatic cyst 07/30/2022   Nausea and vomiting 07/30/2022   Chronic pancreatitis (HCC) 07/29/2022   Dehydration 07/29/2022   Duodenal obstruction 07/27/2022   Acute appendicitis 05/16/2022   Essential hypertension  04/30/2022   Long term current use of opiate analgesic 09/12/2020   Bilateral thoracic back pain 10/13/2017   Past Medical History:  Diagnosis Date   Asthma    childhood   Hypertension     Stroke [facility])    Past Surgical History:  Procedure Laterality Date   APPENDECTOMY  2024   BACK SURGERY     ESOPHAGOGASTRODUODENOSCOPY (EGD) WITH PROPOFOL  N/A 08/01/2022   Procedure: ESOPHAGOGASTRODUODENOSCOPY (EGD) WITH PROPOFOL ;  Surgeon: Rollin Dover, MD;  Location: [facility] ENDOSCOPY;  Service: Gastroenterology;  Laterality: N/A;   EUS N/A 08/01/2022   Procedure: UPPER ENDOSCOPIC ULTRASOUND (EUS) LINEAR;  Surgeon: Rollin Dover, MD;  Location: [facility] ENDOSCOPY;  Service: Gastroenterology;  Laterality: N/A;   FINE NEEDLE ASPIRATION  08/01/2022   Procedure: FINE NEEDLE ASPIRATION (FNA) LINEAR;  Surgeon: Rollin Dover, MD;  Location: [facility] ENDOSCOPY;  Service: Gastroenterology;;   FRACTURE SURGERY Right 2007   arm   XI ROBOTIC LAPAROSCOPIC ASSISTED APPENDECTOMY N/A 05/16/2022   Procedure: XI ROBOTIC LAPAROSCOPIC ASSISTED APPENDECTOMY;  Surgeon: Desiderio Schanz, MD;  Location: [facility] ORS;  Service: General;  Laterality: N/A;   Social History   Tobacco Use   Smoking status: Every Day    Current packs/day: 0.50    Average packs/day: 0.5 packs/day for 40.7 years (20.3 ttl pk-yrs)    Types: Cigarettes    Start date: 08/29/1983   Smokeless tobacco: Never  Vaping Use   Vaping status: Never Used  Substance Use Topics   Alcohol use: Yes    Alcohol/week: 21.0 standard drinks of alcohol    Types: 21 Shots of liquor per week    Comment: 3 shots of liquor everyday   Drug use: Not Currently   Social History   Socioeconomic History   Marital status: Married    Spouse name: Not on file   Number of children: Not on file   Years of education: Not on file   Highest education level: Not on file  Occupational History   Not on file  Tobacco Use   Smoking status: Every Day    Current packs/day: 0.50    Average packs/day: 0.5 packs/day for 40.7 years (20.3 ttl pk-yrs)    Types: Cigarettes    Start date: 08/29/1983   Smokeless tobacco: Never  Vaping Use   Vaping status: Never Used  Substance and Sexual  Activity   Alcohol use: Yes    Alcohol/week: 21.0 standard drinks of alcohol    Types: 21 Shots of liquor per week    Comment: 3 shots of liquor everyday   Drug use: Not Currently   Sexual activity: Yes  Other Topics Concern   Not on file  Social History Narrative   Not on file   Social Drivers of Health   Tobacco Use: High Risk (05/04/2024)   Patient History    Smoking Tobacco Use: Every Day    Smokeless Tobacco Use: Never    Passive Exposure: Not on file  Financial Resource Strain: Not on file  Food Insecurity: No Food Insecurity (04/25/2024)   Epic    Worried About Programme Researcher, Broadcasting/film/video in the Last Year: Never true    Ran Out of Food in the Last Year: Never true  Transportation Needs: No Transportation Needs (04/25/2024)   Epic    Lack of Transportation (Medical): No    Lack of Transportation (Non-Medical): No  Physical Activity: Not on file  Stress: Not on file  Social Connections: Socially Integrated (04/25/2024)   Social Connection and Isolation Panel  Frequency of Communication with Friends and Family: Three times a week    Frequency of Social Gatherings with Friends and Family: Three times a week    Attends Religious Services: More than 4 times per year    Active Member of Clubs or Organizations: No    Attends Banker Meetings: 1 to 4 times per year    Marital Status: Married  Catering Manager Violence: Not At Risk (04/25/2024)   Epic    Fear of Current or Ex-Partner: No    Emotionally Abused: No    Physically Abused: No    Sexually Abused: No  Depression (PHQ2-9): Low Risk (05/04/2024)   Depression (PHQ2-9)    PHQ-2 Score: 0  Alcohol Screen: Low Risk (05/04/2024)   Alcohol Screen    Last Alcohol Screening Score (AUDIT): 0  Housing: Low Risk (04/25/2024)   Epic    Unable to Pay for Housing in the Last Year: No    Number of Times Moved in the Last Year: 0    Homeless in the Last Year: No  Utilities: Not At Risk (04/25/2024)   Epic    Threatened with  loss of utilities: No  Health Literacy: Not on file   Family Status  Relation Name Status   Mother  Deceased   Father  Alive  No partnership data on file   No family history on file. No Known Allergies    Review of Systems  Constitutional:  Negative for chills and fever.  Respiratory:  Negative for cough, sputum production, shortness of breath and wheezing.   Cardiovascular:  Negative for chest pain.  Neurological:  Positive for sensory change, speech change and weakness.      Objective:     BP 120/84 (Cuff Size: Large)   Pulse 93   Temp 97.8 F (36.6 C) (Oral)   Resp 16   Ht 5' 8 (1.727 m)   SpO2 98%   BMI 18.34 kg/m  BP Readings from Last 3 Encounters:  05/04/24 120/84  04/26/24 121/76  04/23/24 132/75   Wt Readings from Last 3 Encounters:  04/26/24 120 lb 9.5 oz (54.7 kg)  04/22/24 130 lb (59 kg)  03/28/24 120 lb 2.4 oz (54.5 kg)      Physical Exam Constitutional:      Appearance: Normal appearance.     Comments: Presents in wheelchair   HENT:     Head: Normocephalic and atraumatic.  Eyes:     Conjunctiva/sclera: Conjunctivae normal.  Cardiovascular:     Rate and Rhythm: Normal rate and regular rhythm.  Pulmonary:     Effort: Pulmonary effort is normal.     Breath sounds: Normal breath sounds.  Musculoskeletal:     Left shoulder: Crepitus present. No swelling. Normal range of motion.     Comments: Tenderness at the [facility] joint on the left but good strength and ROM  Skin:    General: Skin is warm and dry.  Neurological:     Mental Status: He is alert.     Sensory: Sensory deficit present.     Motor: Weakness present.     Comments: Right sided weakness in upper and lower extremity  Psychiatric:        Mood and Affect: Mood normal.        Behavior: Behavior normal.      No results found for any visits on 05/04/24.  Last CBC Lab Results  Component Value Date   WBC 16.4 (H) 04/24/2024   HGB 11.2 (L)  04/24/2024   HCT 32.7 (L) 04/24/2024    MCV 91.6 04/24/2024   MCH 31.4 04/24/2024   RDW 11.8 04/24/2024   PLT 413 (H) 04/24/2024   Last metabolic panel Lab Results  Component Value Date   GLUCOSE 94 04/24/2024   NA 133 (L) 04/24/2024   K 4.0 04/24/2024   CL 94 (L) 04/24/2024   CO2 23 04/24/2024   BUN 16 04/24/2024   CREATININE 0.67 04/24/2024   GFRNONAA >60 04/24/2024   CALCIUM  9.7 04/24/2024   PHOS 3.3 03/19/2024   PROT 7.5 04/23/2024   ALBUMIN 4.1 04/23/2024   BILITOT 0.4 04/23/2024   ALKPHOS 107 04/23/2024   AST 15 04/23/2024   ALT 8 04/23/2024   ANIONGAP 16 (H) 04/24/2024   Last lipids Lab Results  Component Value Date   CHOL 139 03/18/2024   HDL 69 03/18/2024   LDLCALC 47 03/18/2024   TRIG 116 03/18/2024   CHOLHDL 2.0 03/18/2024   Last hemoglobin A1c Lab Results  Component Value Date   HGBA1C 5.4 11/24/2023   Last thyroid functions No results found for: TSH, T3TOTAL, T4TOTAL, THYROIDAB Last vitamin D No results found for: 25OHVITD2, 25OHVITD3, VD25OH Last vitamin B12 and Folate Lab Results  Component Value Date   VITAMINB12 333 08/14/2022      The ASCVD Risk score (Arnett DK, et al., 2019) failed to calculate for the following reasons:   Risk score cannot be calculated because patient has a medical history suggesting prior/existing ASCVD   * - Cholesterol units were assumed    Assessment & Plan:   Assessment & Plan  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 Improvement in motor function noted. Baclofen  used for spasticity management. - Continue baclofen  three times a day for spasticity. - Continue outpatient physical therapy. - Provided a note for work leave due to neurologic deficits.  Community-acquired pneumonia with left pleural effusion Recent pneumonia with left pleural effusion. Elevated WBC and platelets likely due to infection and medication. Sodium slightly elevated.  Respiratory status improved. - Ordered repeat CBC, kidney, liver, and electrolyte panels. - Schedule chest x-ray in one month to assess resolution of pneumonia and pleural effusion.  Mixed hyperlipidemia Managed with cholesterol medication. Current dose is 20 mg for high risk reduction. Discussed importance of cholesterol management. - Continue cholesterol medication at 20 mg daily.  Essential hypertension Well-controlled with current medication regimen. Blood pressure today was 120/84 mmHg. - Continue lisinopril  5 mg daily.  Alcohol use with risk of neuropathy and history of pancreatitis Alcohol use with reduced intake but still daily consumption. Risk of neuropathy and pancreatitis discussed. Thiamine  and folic acid  prescribed to prevent neuropathy. - Continue thiamine  and folic acid  supplementation. - Encouraged reduction of alcohol intake. - Discussed potential for medications to help with alcohol cravings if needed.  - rosuvastatin  (CRESTOR ) 20 MG tablet; Take 1 tablet (20 mg total) by mouth daily.  Dispense: 90 tablet; Refill: 1 - baclofen  (LIORESAL ) 10 MG tablet; Take 1 tablet (10 mg total) by mouth 3 (three) times daily.  Dispense: 270 each; Refill: 1 - CBC w/Diff/Platelet - Comprehensive Metabolic Panel (CMET) - lisinopril  (ZESTRIL ) 5 MG tablet; Take 1 tablet (5 mg total) by mouth daily.  Dispense: 90 tablet; Refill: 1 - DG Chest 2 View; Future   Return in about 3 months (around 08/02/2024).    Sharyle Fischer, DO "

## 2024-05-05 ENCOUNTER — Ambulatory Visit

## 2024-05-05 ENCOUNTER — Ambulatory Visit: Payer: Self-pay | Admitting: Internal Medicine

## 2024-05-05 DIAGNOSIS — R262 Difficulty in walking, not elsewhere classified: Secondary | ICD-10-CM

## 2024-05-05 DIAGNOSIS — R278 Other lack of coordination: Secondary | ICD-10-CM

## 2024-05-05 DIAGNOSIS — R41841 Cognitive communication deficit: Secondary | ICD-10-CM

## 2024-05-05 DIAGNOSIS — I69351 Hemiplegia and hemiparesis following cerebral infarction affecting right dominant side: Secondary | ICD-10-CM

## 2024-05-05 DIAGNOSIS — M6281 Muscle weakness (generalized): Secondary | ICD-10-CM

## 2024-05-05 DIAGNOSIS — R471 Dysarthria and anarthria: Secondary | ICD-10-CM

## 2024-05-05 DIAGNOSIS — I639 Cerebral infarction, unspecified: Secondary | ICD-10-CM

## 2024-05-05 DIAGNOSIS — R2681 Unsteadiness on feet: Secondary | ICD-10-CM

## 2024-05-05 LAB — CBC WITH DIFFERENTIAL/PLATELET
Absolute Lymphocytes: 1785 {cells}/uL (ref 850–3900)
Absolute Monocytes: 525 {cells}/uL (ref 200–950)
Basophils Absolute: 28 {cells}/uL (ref 0–200)
Basophils Relative: 0.4 %
Eosinophils Absolute: 63 {cells}/uL (ref 15–500)
Eosinophils Relative: 0.9 %
HCT: 35.6 % — ABNORMAL LOW (ref 39.4–51.1)
Hemoglobin: 11.3 g/dL — ABNORMAL LOW (ref 13.2–17.1)
MCH: 29.7 pg (ref 27.0–33.0)
MCHC: 31.7 g/dL (ref 31.6–35.4)
MCV: 93.4 fL (ref 81.4–101.7)
MPV: 8.9 fL (ref 7.5–12.5)
Monocytes Relative: 7.5 %
Neutro Abs: 4599 {cells}/uL (ref 1500–7800)
Neutrophils Relative %: 65.7 %
Platelets: 722 10*3/uL — ABNORMAL HIGH (ref 140–400)
RBC: 3.81 Million/uL — ABNORMAL LOW (ref 4.20–5.80)
RDW: 11.9 % (ref 11.0–15.0)
Total Lymphocyte: 25.5 %
WBC: 7 10*3/uL (ref 3.8–10.8)

## 2024-05-05 LAB — COMPREHENSIVE METABOLIC PANEL WITH GFR
AG Ratio: 1.5 (calc) (ref 1.0–2.5)
ALT: 18 U/L (ref 9–46)
AST: 17 U/L (ref 10–35)
Albumin: 4.2 g/dL (ref 3.6–5.1)
Alkaline phosphatase (APISO): 102 U/L (ref 35–144)
BUN: 7 mg/dL (ref 7–25)
CO2: 29 mmol/L (ref 20–32)
Calcium: 9.4 mg/dL (ref 8.6–10.3)
Chloride: 102 mmol/L (ref 98–110)
Creat: 0.71 mg/dL (ref 0.70–1.30)
Globulin: 2.8 g/dL (ref 1.9–3.7)
Glucose, Bld: 100 mg/dL — ABNORMAL HIGH (ref 65–99)
Potassium: 5.4 mmol/L — ABNORMAL HIGH (ref 3.5–5.3)
Sodium: 141 mmol/L (ref 135–146)
Total Bilirubin: 0.2 mg/dL (ref 0.2–1.2)
Total Protein: 7 g/dL (ref 6.1–8.1)
eGFR: 111 mL/min/{1.73_m2}

## 2024-05-05 NOTE — Therapy (Signed)
 " OUTPATIENT PHYSICAL THERAPY TREATMENT   Patient Name: Isaac Meza MRN: [id number] DOB:Jun 10, 1970, 54 y.o., male Today's Date: 05/05/2024  PCP: [name], DO  REFERRING PROVIDER: Pegge Toribio PARAS, PA-C  END OF SESSION:  PT End of Session - 05/05/24 0809     Visit Number 2    Number of Visits 24    Date for Recertification  07/11/24    Authorization Type  Medicaid [employer] (cert for 8/87/73-08/08/71)    Authorization Time Period Authorization from 04/18/2024-07/16/2024 for 11 PT Visits    Authorization - Visit Number 2    Authorization - Number of Visits 11    Progress Note Due on Visit 10    PT Start Time 0847    PT Stop Time 0929    PT Time Calculation (min) 42 min    Equipment Utilized During Treatment Gait belt    Activity Tolerance Patient tolerated treatment well;No increased pain    Behavior During Therapy [facility] for tasks assessed/performed;Flat affect          Past Medical History:  Diagnosis Date   Asthma    childhood   Hypertension    Stroke [facility])    Past Surgical History:  Procedure Laterality Date   APPENDECTOMY  2024   BACK SURGERY     ESOPHAGOGASTRODUODENOSCOPY (EGD) WITH PROPOFOL  N/A 08/01/2022   Procedure: ESOPHAGOGASTRODUODENOSCOPY (EGD) WITH PROPOFOL ;  Surgeon: Rollin Dover, MD;  Location: [facility] ENDOSCOPY;  Service: Gastroenterology;  Laterality: N/A;   EUS N/A 08/01/2022   Procedure: UPPER ENDOSCOPIC ULTRASOUND (EUS) LINEAR;  Surgeon: Rollin Dover, MD;  Location: [facility] ENDOSCOPY;  Service: Gastroenterology;  Laterality: N/A;   FINE NEEDLE ASPIRATION  08/01/2022   Procedure: FINE NEEDLE ASPIRATION (FNA) LINEAR;  Surgeon: Rollin Dover, MD;  Location: [facility] ENDOSCOPY;  Service: Gastroenterology;;   FRACTURE SURGERY Right 2007   arm   XI ROBOTIC LAPAROSCOPIC ASSISTED APPENDECTOMY N/A 05/16/2022   Procedure: XI ROBOTIC LAPAROSCOPIC ASSISTED APPENDECTOMY;  Surgeon: Desiderio Schanz, MD;  Location: [facility] ORS;  Service: General;  Laterality: N/A;    Patient Active Problem List   Diagnosis Date Noted   Community acquired pneumonia 04/24/2024   Tobacco use disorder 04/24/2024   Infarction of left basal ganglia (HCC) 03/28/2024   CVA (cerebral vascular accident) (HCC) 03/17/2024   Stroke (HCC) 03/17/2024   Epigastric abdominal pain 12/06/2022   Hypertensive urgency 12/06/2022   Alcohol use disorder 12/06/2022   Erosive gastropathy 12/06/2022   Erythrocytosis 12/06/2022   Protein-calorie malnutrition, severe 08/01/2022   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   Abnormal CT of the abdomen 07/30/2022   Pancreatic cyst 07/30/2022   Nausea and vomiting 07/30/2022   Chronic pancreatitis (HCC) 07/29/2022   Dehydration 07/29/2022   Duodenal obstruction 07/27/2022   Acute appendicitis 05/16/2022   Essential hypertension 04/30/2022   Long term current use of opiate analgesic 09/12/2020   Bilateral thoracic back pain 10/13/2017    ONSET DATE: 03/24/24  REFERRING DIAG: Left basal ganglia embolic stroke   THERAPY DIAG:  Muscle weakness (generalized)  Hemiplegia and hemiparesis following cerebral infarction affecting right dominant side (HCC)  Difficulty in walking, not elsewhere classified  Unsteadiness on feet  Rationale for Evaluation and Treatment: Rehabilitation  SUBJECTIVE:  SUBJECTIVE STATEMENT: Patient reports doing okay- been walking in home with cane as instructed. States has question about steps in terms of which foot he leads with going up and down.   Pt accompanied by: Wife  PERTINENT HISTORY:  Isaac Meza is a 53yoM who comes to [facility] on 03/17/24 c Rt hemiweakness and slurred speech, 2 falls at home while going to [facility]. PMH: HTN, heavy ETOH use. Workup at that time reveal acute infarct, however symptom largely resolved by time of PT  evaluation. Pt back to [facility] ED on 03/24/24 with Rt side weakness, swallowing difficulty, MRI revealing of evolution of Lt corona radiata infarct. Pt DC to inpatient rehab setting 03/29/24-04/12/24, DC with rehab DME reqs for hurrycane and 16x16 manual WC, Pt able to AMB minA for ~18ft. At evaluation time here, pt is practicing waling QD with wife, AFO, SPC, otherwise uses WC to navigate the home most of day.  PAIN:  Are you having pain? Rt lateral upper flank rib area pain from fall.   PRECAUTIONS: Fall  WEIGHT BEARING RESTRICTIONS: No  FALLS: Has patient fallen in last 6 months? 1 fall since being home on morning of 1/12 trying to get out of bed on his own.   LIVING ENVIRONMENT: Lives with: Wife, MIL, FIL Lives in: [place], 2 story, able to live on main level Stairs: 2, no railing Has following equipment at home: DC to home from CIR with AFO, Hurrycane, and RW with hand splint (which he is unable to use)   PLOF:  Fully independent, working 2 physically demanding jobs.   PATIENT GOALS: regain as much baseline function as possible   OBJECTIVE:  Note: Objective measures were completed at Evaluation unless otherwise noted.  DIAGNOSTIC FINDINGS:  MRI 03/23/24: IMPRESSION: 1. Interval expansion and worsening in recently identified posterior left basal ganglia infarct, now measuring up to 2.9 cm in size. No associated hemorrhage or significant regional mass effect. 2. No other new intracranial abnormality. 3. Underlying mild chronic microvascular ischemic disease with remote lacunar infarcts about the bilateral basal ganglia and thalami. 4. Chronic left ethmoidal and left maxillary sinusitis, left OMU obstructive pattern.     Electronically Signed   By: Morene Hoard M.D.   On: 03/23/2024 19:15  COGNITION: Overall cognitive status: WFL (see SLP evlauation for more detail)   BED MOBILITY:  -requires minA for in/out of bed at home;   TRANSFERS: -10x STS, WC push off:  54.89sec   STAIRS:  GAIT: -3 point gait with Left lateral lean, heavy support on device, AFO in place with mild, intermittent Rt toe scuff audible, Rt toe out >30 degrees.   FUNCTIONAL TESTS:  -10x STS, WC push off: 54.89sec   PATIENT SURVEYS:                                                                                                                                TREATMENT DATE 05/05/24:  Assessed RLE strength via  MMT -grossly 2-/5 with R hip flex/ext/abd and knee flex/ext (did not remove AFO) to assess ankle today.   10 Meter Walk Test: Patient instructed to walk 10 meters (32.8 ft) as quickly and as safely as possible at their normal speed x2 and at a fast speed x2. Time measured from 2 meter mark to 8 meter mark to accommodate ramp-up and ramp-down.  Normal speed 1: 70.25 sec or 0.14 m/s with use of cane  Cut off scores: <0.4 m/s = household Ambulator, 0.4-0.8 m/s = limited community Ambulator, >0.8 m/s = community Ambulator, >1.2 m/s = crossing a street, <1.0 = increased fall risk MCID 0.05 m/s (small), 0.13 m/s (moderate), 0.06 m/s (significant)  (ANPTA Core Set of Outcome Measures for Adults with Neurologic Conditions, 2018)`0 STS (instruction  TA:  Instruction in safe and efficient Sit to Stand without UE support- VC and visual demo to scoot out to edge then position feet (some UE assist to position RLE) and then to lean forward while looking ahead (nose over toes) and patient performed 10 reps, rest then another 5 reps- focusing on technique - from min assist quickly only to CGA.   Instructed in Steps today:   -visual demo on how to perform steps- going up with LLE    And down initially with RLE- patient perform 1 round  going  Up and down 4 steps with CGA and struggled to raise his   RLE secondary to hip and hamstring weakness - clipping  Step several times using hip hike strategy.    Self care/home management:   -Instructed in the following below issued HEP-     -STS   -Step tap with each LE (20 reps today) min guarding of R knee   -Fwd/bwd walking at support bar- down and back x 8 (progressed from step to pattern to step through with fwd-still min step going bwd   -Side stepping walking at support bar- down and back x 5 (VC to perform as wide of step as possible)       PATIENT EDUCATION: Education details: Gait quality and instuction in steps, STS, and below HEP Person educated: Patient Education method: Explanation, positive reinforcement, explicit instruction, handout Education comprehension: Able to verbalize and demo good understanding  HOME EXERCISE PROGRAM: Access Code: [email or web address] URL: [email or web address] Date: 05/05/2024 Prepared by: Reyes London  Exercises - Sit to Stand with Arms Crossed  - 3 x weekly - 3 sets - 10 reps - Step Taps on Low Step  - 3-5 x weekly - 3 sets - 10 reps - Side Stepping with Counter Support  - 1 x daily - 3-5 x weekly - 5 sets - Backward Walking with Counter Support  - 3-5 x weekly - 5 sets  GOALS: Goals reviewed with patient? No   SHORT TERM GOALS: Target date: 05/30/24  Pt to demonstrate tolerance to overground AMB >573ft without LOB at supervision level DME ad lib.  Baseline: Goal status: INITIAL  2.  Pt to demonstrate improvement in gait ergonomics AEB gait speed increase >20%.  Baseline:  Goal status: INITIAL  3.  Pt to report successful AMB in home ad lib at modified independent level without any falls for most recent 4 weeks.  Baseline:  Goal status: INITIAL  LONG TERM GOALS: Target date: 07/11/24 Pt to demonstrate tolerance to overground AMB >526ft including surface variety simulating outside navigation: variable grades, surface stypes, incline, and intermittent setp up/down, without LOB at supervision level DME ad lib.  Baseline: Goal  status: INITIAL  2.  Pt to demonstrate improvement in gait ergonomics AEB gait speed increase >40%.  Baseline:  Goal  status: INITIAL  3.  Pt to demonstrate sustained standing tolerance >10 minutes for stationary functional household tasks.   Baseline:  Goal status: INITIAL  4. Patient will increase 10 meter walk test to > 0.6 m/s as to improve gait speed for better community ambulation and to reduce fall risk. Baseline: 05/05/2024= 0.14 m/s with cane Goal status: INITIAL   ASSESSMENT:  CLINICAL IMPRESSION: The patient is a male with CVA presenting for his second PT visit and continues to show significant right lower extremity weakness, with gross strength of two minus out of five in the hip and knee. Gait speed on the Ten Meter Walk Test measured zero point one four meters per second with a cane, consistent with household ambulation and high fall risk. Sit to stand performance improved from minimal assist to contact guard assist with repeated practice. Stair negotiation remains difficult due to inadequate right foot clearance and compensatory hip hiking. Gait training revealed impaired step mechanics and reduced right step length, particularly during backward walking, and side stepping required cueing for adequate step width. The patient demonstrated good understanding of education and his home exercise program and will benefit from continued skilled PT to address strength deficits, impaired gait speed and functional mobility limitations.  OBJECTIVE IMPAIRMENTS: Decreased knowledge of condition, decreased use of DME, decreased mobility, difficulty walking, decreased strength, decreased ROM. ACTIVITY LIMITATIONS: Lifting, standing, walking, squatting, transfers, locomotion level PARTICIPATION LIMITATIONS: Cleaning, laundry, interpersonal relationships, driving, yardwork, community activity.  PERSONAL FACTORS: Age, behavior pattern, education, past/current experiences, transportation, profession  are also affecting patient's functional outcome.  REHAB POTENTIAL: Good CLINICAL DECISION MAKING: Medium  EVALUATION  COMPLEXITY: Moderate   PLAN:  PT FREQUENCY: 1-2x/week  PT DURATION: 12 weeks  PLANNED INTERVENTIONS: 97110-Therapeutic exercises, 97530- Therapeutic activity, 97112- Neuromuscular re-education, [phone]- Self Care, 02859- Manual therapy, [phone]- Gait training, [phone]- Orthotic/Prosthetic subsequent, [id number]- Electrical stimulation (unattended), [phone]- Electrical stimulation (manual), Patient/Family education, Balance training, Stair training, Joint mobilization, Cognitive remediation, DME instructions, Wheelchair mobility training, Cryotherapy, and Moist heat  PLAN FOR NEXT SESSION:  -Increase gait intensity over short distance -Increase gait instability over shorter distance -more backward and lateral stepping    Reyes LOISE London, PT 05/05/2024, 10:18 AM  10:18 AM, 05/05/24 Chyrl London, PT Physical Therapist - [facility]  Outpatient Physical Therapy- Main Campus [phone]         "

## 2024-05-05 NOTE — Therapy (Signed)
 " OUTPATIENT SPEECH LANGUAGE PATHOLOGY  TREATMENT   Patient Name: Isaac Meza MRN: [id number] DOB:06/26/1970, 54 y.o., male Today's Date: 05/05/2024  PCP: Isaac Fischer, DO REFERRING PROVIDER: Toribio Haggard, PA-C   End of Session - 05/05/24 0919     Visit Number 2    Number of Visits 24    Date for Recertification  07/11/24    SLP Start Time 0805    SLP Stop Time  0845    SLP Time Calculation (min) 40 min    Activity Tolerance Patient tolerated treatment well          Patient Active Problem List   Diagnosis Date Noted   Community acquired pneumonia 04/24/2024   Tobacco use disorder 04/24/2024   Infarction of left basal ganglia (HCC) 03/28/2024   CVA (cerebral vascular accident) (HCC) 03/17/2024   Stroke (HCC) 03/17/2024   Epigastric abdominal pain 12/06/2022   Hypertensive urgency 12/06/2022   Alcohol use disorder 12/06/2022   Erosive gastropathy 12/06/2022   Erythrocytosis 12/06/2022   Protein-calorie malnutrition, severe 08/01/2022   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   Abnormal CT of the abdomen 07/30/2022   Pancreatic cyst 07/30/2022   Nausea and vomiting 07/30/2022   Chronic pancreatitis (HCC) 07/29/2022   Dehydration 07/29/2022   Duodenal obstruction 07/27/2022   Acute appendicitis 05/16/2022   Essential hypertension 04/30/2022   Long term current use of opiate analgesic 09/12/2020   Bilateral thoracic back pain 10/13/2017    ONSET DATE: 03/17/24   REFERRING DIAG: I63.9 (ICD-10-CM) - Left basal ganglia embolic stroke (HCC)   THERAPY DIAG:  Dysarthria and anarthria  Cognitive communication deficit  Rationale for Evaluation and Treatment Rehabilitation  SUBJECTIVE:   SUBJECTIVE STATEMENT: Pt alert, pleasant, and cooperative.  Pt accompanied by: self  PERTINENT HISTORY & DIAGNOSTIC FINDINGS: Isaac Meza is a 54 year old right-handed male with history of childhood asthma hypertension and tobacco/alcohol use.Presented 03/17/2024 with of  right sided weakness and slurred speech with ataxic gait x 2 days. MRI of the brain showed patchy acute small vessel ischemic type infarcts in the left corona radiata tracking towards the posterior left lentiform. Pt was discharged to home on 03/19/2024. Patient was doing well until the morning of 03/23/2024 with increasing weakness right side weakness and worsening slurred speech. Repeat CT/MRI showed interval expansion and worsening and recently identified posterior left basal ganglia infarct now measuring up to 2.9 cm in size. No associated hemorrhage or significant regional mass effect. 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  Pt was admitted to CIR on 03/28/24-04/12/24.  PAIN:  Are you having pain? No   FALLS: Has patient fallen in last 6 months?  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 environmental modifications). Pt required rare-min cues for dysarthria strategies during sentence repetition task, min/mod cues during sentence reading task. During SFA task, pt required min cues to generate x4 features per object. Reduced carryover of strategies at conversation level. Pt endorsed using EMST at home; pt to bring to next session.    PATIENT EDUCATION: Education details: as above  Person educated: Patient Education  method: Explanation Education comprehension: verbalized understanding and needs further education  HOME EXERCISE PROGRAM:        Practice dysarthria and anomia strategies; pt to  bring EMST to next ST session    GOALS:  Goals reviewed with patient? Yes  SHORT TERM GOALS: Target date: 10 sessions  With Min A, patient will demo HEP for motor speech accurately in 4/5 opportunities.   Baseline: Goal status: INITIAL   2.  With Min A, patient will improve speech intelligibility for  phrase by controlling rate of speech, over-articulation, and increased loudness to achieve 80% intelligibility.   Baseline:  Goal status: INITIAL  3.  With Min A, patient will complete 5 sets of 5 repetitions with EMST set at training threshold x2 sessions.  Baseline:  Goal status: INITIAL  4.  Pt will utilize preferred anomia compensation with min A during structured tasks.    Baseline:  Goal status: INITIAL  5.  Given a functional problem (ie., social problem, multi-step math word problem, logic puzzle) or scenario from daily life, patient will demonstrate error awareness and correct errors independently with at least 80% acc.   Baseline:  Goal Status: INITIAL   LONG TERM GOALS: Target date: 12 weeks  Pt will will report improved communication per PROM. Baseline: CES 19/32 04/18/24 Goal status: INITIAL  2.  Pt will utilize preferred compensations for dysarthria and anomia to express complex information with min A.  Baseline:  Goal status: INITIAL  3.  Pt will demonstrate problem solving for complex daily situations with no more than min A.  Baseline:  Goal status: INITIAL    ASSESSMENT:  CLINICAL IMPRESSION:  Patient is a 54 y.o. male who was seen today for motor-speech and cognitive-communication evaluation in setting of basal ganglia stroke. Pt with recent d/c from CIR.    Assessment completed via informal means, standardized assessment (Cognitive-Linguistic Quick Test), and PROM (Communication Effectiveness Survey).    Based on initial assessment, pt presents with a moderate-severe dysarthria impacting intelligibility across all levels of speech (~50% to an  unfamiliar listener). Dysarthria c/b reduced vocal loudness, intermittent hoarse vocal quality, and articulatory imprecision. Additionally, pt with mild cognitive-communication deficits. Per CLQT, pt with difficulty with attention and executive functioning. Pt scored 0/12 on Symbol Cancellation due to marking the incorrect symbol 12 times. This subtest is weighted heavily in the attention subscore. SLP noted wordfinding deficits conversationally and during divergent naming subtest.    Pt highly motivated to return to [facility]. 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 He endorsed that his current deficits make communicating difficulty in a variety of settings with different communication partners.    See details of today's tx above.   Recommend course of ST for above mentioned deficits with emphasis on compensation, retraining, and education.   OBJECTIVE IMPAIRMENTS include attention, executive functioning, expressive language, and dysarthria. These impairments are limiting patient from effectively communicating at home and in community. Factors affecting potential to achieve goals and functional outcome are n/a. Patient will benefit from skilled SLP services to address above impairments and improve overall function.  REHAB POTENTIAL: Good  PLAN: SLP FREQUENCY: 2x/week  SLP DURATION: 12 weeks  PLANNED INTERVENTIONS: Cueing hierachy, Cognitive reorganization, Internal/external aids, Functional tasks, Multimodal communication approach, SLP instruction and feedback, Compensatory strategies, and Patient/family education    Delon Bangs, M.S., CCC-SLP Speech-Language Pathologist [place] [facility] [phone] [address])  [facility]  at [facility] [address] [place], [place], [address] Phone: [phone]   Fax:  [phone]             "

## 2024-05-06 NOTE — Therapy (Signed)
 " OUTPATIENT OCCUPATIONAL THERAPY NEURO TREATMENT NOTE  Patient Name: Isaac Meza MRN: [id number] DOB:Oct 03, 1970, 54 y.o., male Today's Date: 05/06/2024  PCP: [name], DO REFERRING PROVIDER:  Pegge Toribio PARAS, PA-C  END OF SESSION:  OT End of Session - 05/06/24 1703     Visit Number 2    Number of Visits 24    Date for Recertification  07/11/24    OT Start Time 0930    OT Stop Time 1015    OT Time Calculation (min) 45 min    Activity Tolerance Patient tolerated treatment well    Behavior During Therapy [facility] for tasks assessed/performed;Flat affect         Past Medical History:  Diagnosis Date   Asthma    childhood   Hypertension    Stroke [facility])    Past Surgical History:  Procedure Laterality Date   APPENDECTOMY  2024   BACK SURGERY     ESOPHAGOGASTRODUODENOSCOPY (EGD) WITH PROPOFOL  N/A 08/01/2022   Procedure: ESOPHAGOGASTRODUODENOSCOPY (EGD) WITH PROPOFOL ;  Surgeon: Rollin Dover, MD;  Location: [facility] ENDOSCOPY;  Service: Gastroenterology;  Laterality: N/A;   EUS N/A 08/01/2022   Procedure: UPPER ENDOSCOPIC ULTRASOUND (EUS) LINEAR;  Surgeon: Rollin Dover, MD;  Location: [facility] ENDOSCOPY;  Service: Gastroenterology;  Laterality: N/A;   FINE NEEDLE ASPIRATION  08/01/2022   Procedure: FINE NEEDLE ASPIRATION (FNA) LINEAR;  Surgeon: Rollin Dover, MD;  Location: [facility] ENDOSCOPY;  Service: Gastroenterology;;   FRACTURE SURGERY Right 2007   arm   XI ROBOTIC LAPAROSCOPIC ASSISTED APPENDECTOMY N/A 05/16/2022   Procedure: XI ROBOTIC LAPAROSCOPIC ASSISTED APPENDECTOMY;  Surgeon: Desiderio Schanz, MD;  Location: [facility] ORS;  Service: General;  Laterality: N/A;   Patient Active Problem List   Diagnosis Date Noted   Community acquired pneumonia 04/24/2024   Tobacco use disorder 04/24/2024   Infarction of left basal ganglia (HCC) 03/28/2024   CVA (cerebral vascular accident) (HCC) 03/17/2024   Stroke (HCC) 03/17/2024   Epigastric abdominal pain 12/06/2022   Hypertensive urgency  12/06/2022   Alcohol use disorder 12/06/2022   Erosive gastropathy 12/06/2022   Erythrocytosis 12/06/2022   Protein-calorie malnutrition, severe 08/01/2022   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   Abnormal CT of the abdomen 07/30/2022   Pancreatic cyst 07/30/2022   Nausea and vomiting 07/30/2022   Chronic pancreatitis (HCC) 07/29/2022   Dehydration 07/29/2022   Duodenal obstruction 07/27/2022   Acute appendicitis 05/16/2022   Essential hypertension 04/30/2022   Long term current use of opiate analgesic 09/12/2020   Bilateral thoracic back pain 10/13/2017   ONSET DATE: 03/17/2025  REFERRING DIAG: CVA  THERAPY DIAG:  Muscle weakness (generalized)  Other lack of coordination  Hemiplegia and hemiparesis following cerebral infarction affecting right dominant side (HCC)  Left basal ganglia embolic stroke (HCC)  Rationale for Evaluation and Treatment: Rehabilitation  SUBJECTIVE:   SUBJECTIVE STATEMENT: Pt reports getting very thirsty during his therapy sessions.  Accommodated with water cup and refills as needed. Pt accompanied by: self: father in waiting room (provided transportation)  PERTINENT HISTORY: PMHx per  inpatient chart review: Admit date: 03/28/2024 Discharge date: 04/12/2024  Discharge Diagnoses:  Principal Problem:   Infarction of left basal ganglia (HCC) Hyperlipidemia History of alcohol use History of tobacco use Hypertension  Brief HPI:   Isaac Meza is a 54 y.o. right-handed male with history of childhood asthma, hypertension, tobacco and alcohol use.  Per chart review lives with spouse as well as in-laws.  Wife works during the day.  Two-level home bed and bath main level.  Independent  prior to admission and working.  Presented 03/17/2024 with right sided weakness and slurred speech with ataxic gait x 2 days.  MRI of the brain showed patchy acute small vessel ischemic type infarcts in the left corona radiata tracking towards the posterior left lentiform.   Echocardiogram with ejection fraction of 60 to 65% no PFO identified.  CT angiogram with no stenosis noted.  Patient was placed on aspirin  81 mg daily and Plavix  75 mg daily for CVA prophylaxis x 3 weeks then aspirin  alone was discharged to home 03/19/2024.  Patient was doing well until the morning of 03/23/2024 with increasing weakness right side and worsening slurred speech.  Repeat CT/MRI showed interval expansion and worsening and recently identified posterior left basal ganglia infarction now measuring up to 2.9 cm in size.  No associated hemorrhage or significant regional mass effect.  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  Neurology follow-up recommendations were for aspirin  81 mg daily and Brilinta  90 mg twice daily x 4 weeks then aspirin  alone.  Tolerating a regular diet.  Therapy evaluations completed due to patient's decreased functional mobility and right side weakness was admitted for a comprehensive rehab program.  Hospital Course: Isaac Meza was admitted to rehab 03/28/2024 for inpatient therapies to consist of PT, ST and OT at least three hours five days a week. Past admission physiatrist, therapy team and rehab RN have worked together to provide customized collaborative inpatient rehab.  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  Follow-up neurology services.  Currently maintained on aspirin  81 mg daily and Brilinta  90 mg twice daily x 4 weeks then aspirin  alone.  Pain management with very minimal use of oxycodone  as well as the addition of baclofen  as needed for muscle spasms.  History of hypertension patient on Norvasc  5 mg daily and lisinopril  20 mg daily prior to admission and held initially for permissive hypertension and would need follow-up with PCP with low-dose lisinopril  resumed.  Crestor  ongoing for  hyperlipidemia.  History of alcohol as well as tobacco use.  Maintained on Nicorette  gum.  Latest alcohol level 155 03/17/2024.  Patient received counts regards cessation of alcohol as well as tobacco products.  PRECAUTIONS: None  WEIGHT BEARING RESTRICTIONS: No  PAIN:  Are you having pain? 8/10 right rib/flank  FALLS: Has patient fallen in last 6 months? Yes. Number of falls 4  LIVING ENVIRONMENT: Lives with: lives with their spouse Lives in: House/apartment Stairs: 2 stairs to enter, 2 levels -does not use 2nd level Has following equipment at home: Quad cane small base, Environmental Consultant - 2 wheeled, and Wheelchair (manual), shower chair,   PLOF: Independent  PATIENT GOALS: To get everything back  OBJECTIVE:  Note: Objective measures were completed at Evaluation unless otherwise noted.  HAND DOMINANCE: Right  ADLs: * Pt. uses the nondominant left hand for all  ADL/IADL tasks Transfers/ambulation related to ADLs: Eating: Independent using the left hand Grooming:  Inpendent using the left hand UB Dressing: Increased time LB Dressing: Has difficulty with shoes, socks, pants Toileting: Wife assists with toilet transfers, Independent with toilet hygiene care Bathing: MinA with transfers using transfer tub bench, independent bathing  IADLs: Shopping: Wife does grocery shopping Light housekeeping: Has difficulty Meal Prep: Difficulty with house  logistics Community mobility: Wife is driving Medication management: Wife assists with weekly set-up, Pt. 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  POSTURE COMMENTS:  No Significant postural limitations  ACTIVITY TOLERANCE: Activity tolerance: Fair  FUNCTIONAL OUTCOME MEASURES: TBD  UPPER  EXTREMITY ROM:    Active ROM Right eval Left Eval [facility]  Shoulder flexion 0(116)   Shoulder abduction 0(96)   Shoulder adduction    Shoulder extension    Shoulder internal rotation    Shoulder external rotation    Elbow flexion 0(140)   Elbow extension 0(0)   Wrist flexion 0(70)   Wrist extension 0(44)   Wrist ulnar deviation    Wrist radial deviation    Wrist pronation    Wrist supination    (Blank rows = not tested)  Eval:  -Active right scapular elevation -Limited scapular Ab/adduction -Active initiation of very minimal movement in the right thumb IP flexion, extension, MP flexion, and digit flexion   UPPER EXTREMITY MMT:     MMT Right eval Left Eval [facility]  Shoulder flexion 0/5   Shoulder abduction 0/5   Shoulder adduction    Shoulder extension    Shoulder internal rotation    Shoulder external rotation    Middle trapezius    Lower trapezius    Elbow flexion 0/5   Elbow extension 0/5   Wrist flexion 0/5   Wrist extension 0/5   Wrist ulnar deviation    Wrist radial deviation    Wrist pronation    Wrist supination    (Blank rows = not tested)  HAND FUNCTION:  Eval: N/A Pt. Is unable  COORDINATION: Eval: N/A Pt. Is unable  SENSATION: Light touch: WFL Proprioception: WFL  EDEMA: N/A  MUSCLE TONE:  Eval: Decreased tone in the RUE 05/05/24: Modified Ashworth Scale: 1+ in the elbow flexors, 2 in the wrist and digit flexors  COGNITION: Overall cognitive status: Within functional limits for tasks assessed  VISION:  No vision changes  PRAXIS: Impaired                                                                                                                  TREATMENT DATE: 05/05/24  Therapeutic Exercise: In supine: -Performed passive stretching throughout RUE (shoulder, elbow, wrist, digits) d/t increased flexor tone noted, preparing RUE for neuro re-ed activities noted below. In sitting -Performed bilat active shoulder shrugs and retraction  with multimodal cueing for maximizing end range on the R: 10-12 reps each -Instructed in towel squeezes for increasing R grip strength (HEP), and alternating passive wrist and digit ext to normalize tone after every few reps of digit flexion -RUE AAROM: Reps of ER (hand to R ear), IR (hand to R hip), elbow flex/ext (hand to chin/knee); max A   Neuro re-ed: -In supine:  -Performed reps of joint compressions intermittently throughout session for increasing proprioceptive input through the R shoulder, elbow, and wrist -Performed reps of R shoulder  flex/ext, abd/add, ER/IR, horizontal abd/add, with OT providing proprioceptive feedback for movement initiation and throughout planes of movement noted above -Facilitatory muscle tapping to promote R bicep/tricep activation for reps of elbow flex/ext -Facilitatory muscle tapping to volar and dorsal FA to promote muscle activation for R wrist flex/ext  -R grip squeezes into therapist's hand, alternated by passive wrist and digit ext for tone normalization  -Facilitated WB through the R elbow/wrist/hand with palm flat on mat table, pt sitting edge of mat; OT mod A to assume stated WB position -Completed reps of place and hold with R shoulder elevated to 90* and elbow extended toward ceiling, with OT providing constant min A for RUE proximal stability, and intermittent distal stability at the elbow and wrist, with visual target for reaching to maintain arm position  Self Care: -Reviewed RUE positioning strategies at rest: placing R hand with digits extend on lap when able, or positioning R hand into weight bearing position beside R hip/thigh -Reviewed strategies for RUE self-PROM throughout the RUE; handouts issued and reviewed, with reinforcement of need for routine daily ROM to maintain joint range for potential active movement, while reducing risk of joint contractures.   PATIENT EDUCATION: Education details: Self ROM throughout the RUE/ R hand WB  positioning strategies Person educated: Patient Education method: Explanation, Tactile cues, Verbal cues, and Handouts Education comprehension: verbalized understanding, returned demonstration, verbal cues required, tactile cues required, and needs further education  HOME EXERCISE PROGRAM: Self PROM throughout the RUE, active scapular mobility  WB positions for RUE Towel squeezes for R hand grip strengthening, alternating passive wrist and digit ext stretching to normalize tone  GOALS: Goals reviewed with patient? Yes  SHORT TERM GOALS: Target date: 05/30/2024   Pt.will demonstrate independence with HEPs for the RUE Baseline: Eval: No current HEP Goal status: INITIAL   LONG TERM GOALS: Target date: 07/11/2024  Pt. Will improve AROM for shoulder flexion by 20 degrees in preparation for functional reaching. Baseline: Eval: Right shoulder flexion: 0(116) Goal status: INITIAL  2.  Pt. Will improve AROM for Right shoulder abduction by 20 degrees in preparation for assisting with haircare. Baseline: Eval: Right shoulder abduction: 0(96) Goal status: INITIAL  3.  Pt. Will improve right elbow flexion bu 10 degrees in preparation for perfroming hand to mouth patterns of movement. Baseline: Eval: Right elbow flexion: 0(140) Goal status: INITIAL  4.  Pt. Will consistently initiate right elbow extension in preparation for reaching out for items on a table. Baseline: Eval: No active initiation of elbow extension Goal status: INITIAL  5.  Pt.will improve right wrist extension by 10 degrees in preparation for initiating grasping for items. Baseline: Eval: Wrist extension 0(44) Goal status: INITIAL  6.  Pt. Will independently be able to extend the digits on his right hand to be able to assume a position of weightbearing/proprioception. Baseline: Eval: Very minimal Active initiation of movement in the right thumb IP Flexion, extension, MP flexion, and digit flexion. Goal status:  INITIAL  ASSESSMENT:  CLINICAL IMPRESSION: Pt with good tolerance to therapeutic exercises and neuro re-ed activities noted above, with no reports of pain in the RUE, though requiring frequent rest breaks to drink d/t significant thirst throughout session.  Pt tolerated supine exercises well initially, but requested transition to seated exercises mid session, with pt reporting that he was starting to get anxious lying down.  Pt demonstrated good understanding of self PROM exercises throughout the RUE, with OT providing min-mod vc/tactile cues for form/technique/following visual handout.  Noted  developing flexor tone in the RUE; see Modified Ashworth scores updated under initial evaluation muscle tone section above.  Pt also showing some new motor return today as compared to eval MMT, noting ability to perform place and hold exercise with R shoulder elevated to 90* in supine and elbow fully extended, requiring constant proximal support and only intermittent distal support at the elbow and wrist, briefly holding elbow within 30 degrees of extension given visual target for reaching toward ceiling.  Also noted trace to 2- contractions in the shoulder, 2 in the biceps, 3- in the triceps, 1 to 2- in the wrist, and at least 2 in the digit flexors.  Pt will continue to benefit from OT services to work on facilitating active volitional movement in order to improve overall RUE functioning to increase engagement in, and maximize independence with ADLS, and IADL tasks.  PERFORMANCE DEFICITS: in functional skills including ADLs, IADLs, coordination, ROM, strength, pain, Fine motor control, Gross motor control, and UE functional use, cognitive skills including, and psychosocial skills including coping strategies, environmental adaptation, interpersonal interactions, and routines and behaviors.   IMPAIRMENTS: are limiting patient from ADLs, IADLs, and leisure.   CO-MORBIDITIES: may have co-morbidities  that affects  occupational performance. Patient will benefit from skilled OT to address above impairments and improve overall function.  MODIFICATION OR ASSISTANCE TO COMPLETE EVALUATION: Min-Moderate modification of tasks or assist with assess necessary to complete an evaluation.  OT OCCUPATIONAL PROFILE AND HISTORY: Detailed assessment: Review of records and additional review of physical, cognitive, psychosocial history related to current functional performance.  CLINICAL DECISION MAKING: Moderate - several treatment options, min-mod task modification necessary  REHAB POTENTIAL: Good  EVALUATION COMPLEXITY: Moderate    PLAN:  OT FREQUENCY: 2x/week  OT DURATION: 12 weeks  PLANNED INTERVENTIONS: 97168 OT Re-evaluation, 97535 self care/ADL training, 02889 therapeutic exercise, 97530 therapeutic activity, 97112 neuromuscular re-education, 97140 manual therapy, 97018 paraffin, 02989 moist heat, 97010 cryotherapy, 97034 contrast bath, 97760 Orthotic Initial, 97763 Orthotic/Prosthetic subsequent, energy conservation, patient/family education, and DME and/or AE instructions  RECOMMENDED OTHER SERVICES: PT, and ST  CONSULTED AND AGREED WITH PLAN OF CARE: family member/caregiver  PLAN FOR NEXT SESSION: Treatment  Inocente Blazing, MS, OTR/L   05/06/2024, 5:05 PM      "

## 2024-05-12 ENCOUNTER — Ambulatory Visit: Admitting: Occupational Therapy

## 2024-05-12 ENCOUNTER — Ambulatory Visit

## 2024-05-12 DIAGNOSIS — R471 Dysarthria and anarthria: Secondary | ICD-10-CM

## 2024-05-12 DIAGNOSIS — M6281 Muscle weakness (generalized): Secondary | ICD-10-CM

## 2024-05-12 DIAGNOSIS — R278 Other lack of coordination: Secondary | ICD-10-CM

## 2024-05-12 DIAGNOSIS — R262 Difficulty in walking, not elsewhere classified: Secondary | ICD-10-CM

## 2024-05-12 DIAGNOSIS — I639 Cerebral infarction, unspecified: Secondary | ICD-10-CM

## 2024-05-12 DIAGNOSIS — R41841 Cognitive communication deficit: Secondary | ICD-10-CM

## 2024-05-12 DIAGNOSIS — I69351 Hemiplegia and hemiparesis following cerebral infarction affecting right dominant side: Secondary | ICD-10-CM

## 2024-05-12 DIAGNOSIS — R2681 Unsteadiness on feet: Secondary | ICD-10-CM

## 2024-05-12 NOTE — Therapy (Signed)
 " OUTPATIENT OCCUPATIONAL THERAPY NEURO TREATMENT NOTE  Patient Name: Isaac Meza MRN: [id number] DOB:01/28/1971, 54 y.o., male Today's Date: 05/12/2024  PCP: [name], DO REFERRING PROVIDER:  Pegge Toribio PARAS, PA-C  END OF SESSION:  OT End of Session - 05/12/24 0909     Visit Number 3    Number of Visits 24    Date for Recertification  07/11/24    OT Start Time 0800    OT Stop Time 0845    OT Time Calculation (min) 45 min    Behavior During Therapy [facility] for tasks assessed/performed;Flat affect         Past Medical History:  Diagnosis Date   Asthma    childhood   Hypertension    Stroke [facility])    Past Surgical History:  Procedure Laterality Date   APPENDECTOMY  2024   BACK SURGERY     ESOPHAGOGASTRODUODENOSCOPY (EGD) WITH PROPOFOL  N/A 08/01/2022   Procedure: ESOPHAGOGASTRODUODENOSCOPY (EGD) WITH PROPOFOL ;  Surgeon: Rollin Dover, MD;  Location: [facility] ENDOSCOPY;  Service: Gastroenterology;  Laterality: N/A;   EUS N/A 08/01/2022   Procedure: UPPER ENDOSCOPIC ULTRASOUND (EUS) LINEAR;  Surgeon: Rollin Dover, MD;  Location: [facility] ENDOSCOPY;  Service: Gastroenterology;  Laterality: N/A;   FINE NEEDLE ASPIRATION  08/01/2022   Procedure: FINE NEEDLE ASPIRATION (FNA) LINEAR;  Surgeon: Rollin Dover, MD;  Location: [facility] ENDOSCOPY;  Service: Gastroenterology;;   FRACTURE SURGERY Right 2007   arm   XI ROBOTIC LAPAROSCOPIC ASSISTED APPENDECTOMY N/A 05/16/2022   Procedure: XI ROBOTIC LAPAROSCOPIC ASSISTED APPENDECTOMY;  Surgeon: Desiderio Schanz, MD;  Location: [facility] ORS;  Service: General;  Laterality: N/A;   Patient Active Problem List   Diagnosis Date Noted   Community acquired pneumonia 04/24/2024   Tobacco use disorder 04/24/2024   Infarction of left basal ganglia (HCC) 03/28/2024   CVA (cerebral vascular accident) (HCC) 03/17/2024   Stroke (HCC) 03/17/2024   Epigastric abdominal pain 12/06/2022   Hypertensive urgency 12/06/2022   Alcohol use disorder 12/06/2022   Erosive  gastropathy 12/06/2022   Erythrocytosis 12/06/2022   Protein-calorie malnutrition, severe 08/01/2022   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   Abnormal CT of the abdomen 07/30/2022   Pancreatic cyst 07/30/2022   Nausea and vomiting 07/30/2022   Chronic pancreatitis (HCC) 07/29/2022   Dehydration 07/29/2022   Duodenal obstruction 07/27/2022   Acute appendicitis 05/16/2022   Essential hypertension 04/30/2022   Long term current use of opiate analgesic 09/12/2020   Bilateral thoracic back pain 10/13/2017   ONSET DATE: 03/17/2025  REFERRING DIAG: CVA  THERAPY DIAG:  Muscle weakness (generalized)  Rationale for Evaluation and Treatment: Rehabilitation  SUBJECTIVE:   SUBJECTIVE STATEMENT: Upon arrival, Pt. reports that he gets very anxious when lying down on the mat. Pt. Reports that I don't know why I flip out, I just do referring anticipation of laying down on the mat. OT treatment was performed in sitting at the mat today. Pt. Was agreeable to this. Pt accompanied by: self: father in waiting room (provided transportation)  PERTINENT HISTORY: PMHx per  inpatient chart review: Admit date: 03/28/2024 Discharge date: 04/12/2024  Discharge Diagnoses:  Principal Problem:   Infarction of left basal ganglia (HCC) Hyperlipidemia History of alcohol use History of tobacco use Hypertension  Brief HPI:   Isaac Meza is a 54 y.o. right-handed male with history of childhood asthma, hypertension, tobacco and alcohol use.  Per chart review lives with spouse as well as in-laws.  Wife works during the day.  Two-level home bed and bath main level.  Independent  prior to admission and working.  Presented 03/17/2024 with right sided weakness and slurred speech with ataxic gait x 2 days.  MRI of the brain showed patchy acute small vessel ischemic type infarcts in the left corona radiata tracking towards the posterior left lentiform.  Echocardiogram with ejection fraction of 60 to 65% no PFO identified.   CT angiogram with no stenosis noted.  Patient was placed on aspirin  81 mg daily and Plavix  75 mg daily for CVA prophylaxis x 3 weeks then aspirin  alone was discharged to home 03/19/2024.  Patient was doing well until the morning of 03/23/2024 with increasing weakness right side and worsening slurred speech.  Repeat CT/MRI showed interval expansion and worsening and recently identified posterior left basal ganglia infarction now measuring up to 2.9 cm in size.  No associated hemorrhage or significant regional mass effect.  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  Neurology follow-up recommendations were for aspirin  81 mg daily and Brilinta  90 mg twice daily x 4 weeks then aspirin  alone.  Tolerating a regular diet.  Therapy evaluations completed due to patient's decreased functional mobility and right side weakness was admitted for a comprehensive rehab program.  Hospital Course: Maxamillian Tienda was admitted to rehab 03/28/2024 for inpatient therapies to consist of PT, ST and OT at least three hours five days a week. Past admission physiatrist, therapy team and rehab RN have worked together to provide customized collaborative inpatient rehab.  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  Follow-up neurology services.  Currently maintained on aspirin  81 mg daily and Brilinta  90 mg twice daily x 4 weeks then aspirin  alone.  Pain management with very minimal use of oxycodone  as well as the addition of baclofen  as needed for muscle spasms.  History of hypertension patient on Norvasc  5 mg daily and lisinopril  20 mg daily prior to admission and held initially for permissive hypertension and would need follow-up with PCP with low-dose lisinopril  resumed.  Crestor  ongoing for hyperlipidemia.  History of alcohol as well as tobacco use.  Maintained on  Nicorette  gum.  Latest alcohol level 155 03/17/2024.  Patient received counts regards cessation of alcohol as well as tobacco products.  PRECAUTIONS: None  WEIGHT BEARING RESTRICTIONS: No  PAIN:  Are you having pain? 8/10 right rib/flank  FALLS: Has patient fallen in last 6 months? Yes. Number of falls 4  LIVING ENVIRONMENT: Lives with: lives with their spouse Lives in: House/apartment Stairs: 2 stairs to enter, 2 levels -does not use 2nd level Has following equipment at home: Quad cane small base, Environmental Consultant - 2 wheeled, and Wheelchair (manual), shower chair,   PLOF: Independent  PATIENT GOALS: To get everything back  OBJECTIVE:  Note: Objective measures were completed at Evaluation unless otherwise noted.  HAND DOMINANCE: Right  ADLs: * Pt. uses the nondominant left hand for all  ADL/IADL tasks Transfers/ambulation related to ADLs: Eating: Independent using the left hand Grooming:  Inpendent using the left hand UB Dressing: Increased time LB Dressing: Has difficulty with shoes, socks, pants Toileting: Wife assists with toilet transfers, Independent with toilet hygiene care Bathing: MinA with transfers using transfer tub bench, independent bathing  IADLs: Shopping: Wife does grocery shopping Light housekeeping: Has difficulty Meal Prep: Difficulty with house  logistics Community mobility: Wife is driving Medication management: Wife assists with weekly set-up, Pt. 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  POSTURE COMMENTS:  No Significant postural limitations  ACTIVITY TOLERANCE: Activity tolerance: Fair  FUNCTIONAL OUTCOME MEASURES: TBD  UPPER EXTREMITY ROM:    Active ROM Right eval Left Eval [facility]  Shoulder flexion  0(116)   Shoulder abduction 0(96)   Shoulder adduction    Shoulder extension    Shoulder internal rotation    Shoulder external rotation    Elbow flexion 0(140)   Elbow extension 0(0)   Wrist flexion 0(70)   Wrist extension 0(44)   Wrist ulnar deviation    Wrist radial deviation    Wrist pronation    Wrist supination    (Blank rows = not tested)  Eval:  -Active right scapular elevation -Limited scapular Ab/adduction -Active initiation of very minimal movement in the right thumb IP flexion, extension, MP flexion, and digit flexion   UPPER EXTREMITY MMT:     MMT Right eval Left Eval [facility]  Shoulder flexion 0/5   Shoulder abduction 0/5   Shoulder adduction    Shoulder extension    Shoulder internal rotation    Shoulder external rotation    Middle trapezius    Lower trapezius    Elbow flexion 0/5   Elbow extension 0/5   Wrist flexion 0/5   Wrist extension 0/5   Wrist ulnar deviation    Wrist radial deviation    Wrist pronation    Wrist supination    (Blank rows = not tested)  HAND FUNCTION:  Eval: N/A Pt. Is unable  COORDINATION: Eval: N/A Pt. Is unable  SENSATION: Light touch: WFL Proprioception: WFL  EDEMA: N/A  MUSCLE TONE:  Eval: Decreased tone in the RUE 05/05/24: Modified Ashworth Scale: 1+ in the elbow flexors, 2 in the wrist and digit flexors  COGNITION: Overall cognitive status: Within functional limits for tasks assessed  VISION:  No vision changes  PRAXIS: Impaired                                                                                                                  TREATMENT DATE: 05/12/24   Therapeutic Exercise:  In sitting: -Performed passive stretching throughout RUE  scapular elevation, depression, abduction/rotation, shoulder flexion, abduction, horizontal abduction, adduction, elbow flexion,a nd extension, forearm supination, pronation, wrist extension, digit MP, PIP, and DIP flexion, and extension, thumb abduction in  preparation for RUE for neuro re-ed activities noted below. -Performed active reps of reaching behind him to his back, and active reps of placing his right hand on, and off of a pillow on his lap  Neuro re-ed:  In sitting:  -Facilitated reps of closed chain weightbearing, and proprioception through the right forearm with slow rocking while in side sitting at the mat.  -Facilitated weightbearing, and proprioception through multiple open chain planes  -Pt. tolerated facilitatory muscle tapping, and vibration to promote  R bicep activation during  reps of elbow flex flexion for hand to mouth face patterns -Facilitatory muscle tapping, and vibration to promote R tricep activation during reps of elbow flex extension for reaching out to the table -Facilitatory muscle tapping to volar and dorsal FA to promote muscle activation for R wrist flex/ext   Therapeutic Activities:  -Facilitated alternating bilateral UE rowing to normalize tone, and facilitate active movement.  -Facilitated combinations of movement alternating weightbearing through the right hand at the mat between reps of wrist, and digit extension with the elbow supported, and facilitory tapping to the forearm wrist, and digit extensors. -Facilitated LUE AROM using the tabletop surface following a set pattern to promote movement through multiple directions  PATIENT EDUCATION: Education details: Self ROM throughout the RUE/ R hand WB positioning strategies Person educated: Patient Education method: Explanation, Tactile cues, Verbal cues, and Handouts Education comprehension: verbalized understanding, returned demonstration, verbal cues required, tactile cues required, and needs further education  HOME EXERCISE PROGRAM: Self PROM throughout the RUE, active scapular mobility  WB positions for RUE Towel squeezes for R hand grip strengthening, alternating passive wrist and digit ext stretching to normalize tone  GOALS: Goals reviewed with  patient? Yes  SHORT TERM GOALS: Target date: 05/30/2024   Pt.will demonstrate independence with HEPs for the RUE Baseline: Eval: No current HEP Goal status: INITIAL   LONG TERM GOALS: Target date: 07/11/2024  Pt. Will improve AROM for shoulder flexion by 20 degrees in preparation for functional reaching. Baseline: Eval: Right shoulder flexion: 0(116) Goal status: INITIAL  2.  Pt. Will improve AROM for Right shoulder abduction by 20 degrees in preparation for assisting with haircare. Baseline: Eval: Right shoulder abduction: 0(96) Goal status: INITIAL  3.  Pt. Will improve right elbow flexion bu 10 degrees in preparation for perfroming hand to mouth patterns of movement. Baseline: Eval: Right elbow flexion: 0(140) Goal status: INITIAL  4.  Pt. Will consistently initiate right elbow extension in preparation for reaching out for items on a table. Baseline: Eval: No active initiation of elbow extension Goal status: INITIAL  5.  Pt.will improve right wrist extension by 10 degrees in preparation for initiating grasping for items. Baseline: Eval: Wrist extension 0(44) Goal status: INITIAL  6.  Pt. Will independently be able to extend the digits on his right hand to be able to assume a position of weightbearing/proprioception. Baseline: Eval: Very minimal Active initiation of movement in the right thumb IP Flexion, extension, MP flexion, and digit flexion. Goal status: INITIAL  ASSESSMENT:  CLINICAL IMPRESSION:  Pt. was treated in sitting at the mat, instead of in supine per Pt. request. Pt. tolerated ROM, neuro re-ed, and UE functioning tasks well. Pt. Was able to consistently initiate moving his right hand on, and off of a pillow placed on his laptop. Pt. was able to initiate moving his hand along his side towards his back to his hip. Pt. Is unable to reach to his back. Pt. requires hand over hand assist to perform all hand to mouth, and hand to face patterns with his right hand. Pt. Is  initiating active responses with digit extension with facilitated.Pt. continues to benefit from OT services to work on facilitating active volitional movement in order to improve overall RUE functioning to increase engagement in, and maximize independence with ADLS, and IADL tasks.  PERFORMANCE DEFICITS: in functional skills including ADLs, IADLs, coordination, ROM, strength, pain, Fine motor control, Gross motor control, and UE functional use, cognitive skills including, and psychosocial skills  including coping strategies, environmental adaptation, interpersonal interactions, and routines and behaviors.   IMPAIRMENTS: are limiting patient from ADLs, IADLs, and leisure.   CO-MORBIDITIES: may have co-morbidities  that affects occupational performance. Patient will benefit from skilled OT to address above impairments and improve overall function.  MODIFICATION OR ASSISTANCE TO COMPLETE EVALUATION: Min-Moderate modification of tasks or assist with assess necessary to complete an evaluation.  OT OCCUPATIONAL PROFILE AND HISTORY: Detailed assessment: Review of records and additional review of physical, cognitive, psychosocial history related to current functional performance.  CLINICAL DECISION MAKING: Moderate - several treatment options, min-mod task modification necessary  REHAB POTENTIAL: Good  EVALUATION COMPLEXITY: Moderate    PLAN:  OT FREQUENCY: 2x/week  OT DURATION: 12 weeks  PLANNED INTERVENTIONS: 97168 OT Re-evaluation, 97535 self care/ADL training, 02889 therapeutic exercise, 97530 therapeutic activity, 97112 neuromuscular re-education, 97140 manual therapy, 97018 paraffin, 02989 moist heat, 97010 cryotherapy, 97034 contrast bath, 97760 Orthotic Initial, 97763 Orthotic/Prosthetic subsequent, energy conservation, patient/family education, and DME and/or AE instructions  RECOMMENDED OTHER SERVICES: PT, and ST  CONSULTED AND AGREED WITH PLAN OF CARE: family member/caregiver  PLAN  FOR NEXT SESSION: Treatment  Richardson Otter, MS, OTR/L    05/12/2024, 9:15 AM      "

## 2024-05-12 NOTE — Therapy (Signed)
 " OUTPATIENT PHYSICAL THERAPY TREATMENT   Patient Name: Isaac Meza MRN: [id number] DOB:11/11/1970, 54 y.o., male Today's Date: 05/12/2024  PCP: [name], DO  REFERRING PROVIDER: Pegge Toribio PARAS, PA-C  END OF SESSION:  PT End of Session - 05/12/24 0850     Visit Number 3    Number of Visits 24    Date for Recertification  07/11/24    Authorization Type [place] [employer] (cert for 8/87/73-08/08/71)    Authorization Time Period Authorization from 04/18/2024-07/16/2024 for 11 PT Visits    Authorization - Number of Visits 11    Progress Note Due on Visit 10    PT Start Time 0848    PT Stop Time 0929    PT Time Calculation (min) 41 min    Equipment Utilized During Treatment Gait belt    Activity Tolerance Patient tolerated treatment well;No increased pain    Behavior During Therapy [facility] for tasks assessed/performed;Flat affect          Past Medical History:  Diagnosis Date   Asthma    childhood   Hypertension    Stroke [facility])    Past Surgical History:  Procedure Laterality Date   APPENDECTOMY  2024   BACK SURGERY     ESOPHAGOGASTRODUODENOSCOPY (EGD) WITH PROPOFOL  N/A 08/01/2022   Procedure: ESOPHAGOGASTRODUODENOSCOPY (EGD) WITH PROPOFOL ;  Surgeon: Rollin Dover, MD;  Location: [facility] ENDOSCOPY;  Service: Gastroenterology;  Laterality: N/A;   EUS N/A 08/01/2022   Procedure: UPPER ENDOSCOPIC ULTRASOUND (EUS) LINEAR;  Surgeon: Rollin Dover, MD;  Location: [facility] ENDOSCOPY;  Service: Gastroenterology;  Laterality: N/A;   FINE NEEDLE ASPIRATION  08/01/2022   Procedure: FINE NEEDLE ASPIRATION (FNA) LINEAR;  Surgeon: Rollin Dover, MD;  Location: [facility] ENDOSCOPY;  Service: Gastroenterology;;   FRACTURE SURGERY Right 2007   arm   XI ROBOTIC LAPAROSCOPIC ASSISTED APPENDECTOMY N/A 05/16/2022   Procedure: XI ROBOTIC LAPAROSCOPIC ASSISTED APPENDECTOMY;  Surgeon: Desiderio Schanz, MD;  Location: [facility] ORS;  Service: General;  Laterality: N/A;   Patient Active Problem List    Diagnosis Date Noted   Community acquired pneumonia 04/24/2024   Tobacco use disorder 04/24/2024   Infarction of left basal ganglia (HCC) 03/28/2024   CVA (cerebral vascular accident) (HCC) 03/17/2024   Stroke (HCC) 03/17/2024   Epigastric abdominal pain 12/06/2022   Hypertensive urgency 12/06/2022   Alcohol use disorder 12/06/2022   Erosive gastropathy 12/06/2022   Erythrocytosis 12/06/2022   Protein-calorie malnutrition, severe 08/01/2022   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   Abnormal CT of the abdomen 07/30/2022   Pancreatic cyst 07/30/2022   Nausea and vomiting 07/30/2022   Chronic pancreatitis (HCC) 07/29/2022   Dehydration 07/29/2022   Duodenal obstruction 07/27/2022   Acute appendicitis 05/16/2022   Essential hypertension 04/30/2022   Long term current use of opiate analgesic 09/12/2020   Bilateral thoracic back pain 10/13/2017    ONSET DATE: 03/24/24  REFERRING DIAG: Left basal ganglia embolic stroke   THERAPY DIAG:  Muscle weakness (generalized)  Other lack of coordination  Hemiplegia and hemiparesis following cerebral infarction affecting right dominant side (HCC)  Left basal ganglia embolic stroke (HCC)  Difficulty in walking, not elsewhere classified  Unsteadiness on feet  Rationale for Evaluation and Treatment: Rehabilitation  SUBJECTIVE:  SUBJECTIVE STATEMENT: Patient reports doing well- no falls and states standing more at home.   Pt accompanied by: Wife  PERTINENT HISTORY:  Isaac Meza is a 53yoM who [facility] on [facility]24 c Rt hemiweakness and slurred speech, 2 falls at home while going to [facility]. PMH: HTN, heavy ETOH use. Workup at that time reveal acute infarct, however symptom largely resolved by time of PT evaluation. Pt back to [facility] ED on 03/24/24 with Rt  side weakness, swallowing difficulty, MRI revealing of evolution of Lt corona radiata infarct. Pt DC to inpatient rehab setting 03/29/24-04/12/24, DC with rehab DME reqs for hurrycane and 16x16 manual WC, Pt able to AMB minA for ~135ft. At evaluation time here, pt is practicing waling QD with wife, AFO, SPC, otherwise uses WC to navigate the home most of day.  PAIN:  Are you having pain? Rt lateral upper flank rib area pain from fall.   PRECAUTIONS: Fall  WEIGHT BEARING RESTRICTIONS: No  FALLS: Has patient fallen in last 6 months? 1 fall since being home on morning of 1/12 trying to get out of bed on his own.   LIVING ENVIRONMENT: Lives with: Wife, MIL, FIL Lives in: [place], 2 story, able to live on main level Stairs: 2, no railing Has following equipment at home: DC to home from CIR with AFO, Hurrycane, and RW with hand splint (which he is unable to use)   PLOF:  Fully independent, working 2 physically demanding jobs.   PATIENT GOALS: regain as much baseline function as possible   OBJECTIVE:  Note: Objective measures were completed at Evaluation unless otherwise noted.  DIAGNOSTIC FINDINGS:  MRI 03/23/24: IMPRESSION: 1. Interval expansion and worsening in recently identified posterior left basal ganglia infarct, now measuring up to 2.9 cm in size. No associated hemorrhage or significant regional mass effect. 2. No other new intracranial abnormality. 3. Underlying mild chronic microvascular ischemic disease with remote lacunar infarcts about the bilateral basal ganglia and thalami. 4. Chronic left ethmoidal and left maxillary sinusitis, left OMU obstructive pattern.     Electronically Signed   By: Morene Hoard M.D.   On: 03/23/2024 19:15  COGNITION: Overall cognitive status: WFL (see SLP evlauation for more detail)   BED MOBILITY:  -requires minA for in/out of bed at home;   TRANSFERS: -10x STS, WC push off: 54.89sec   STAIRS:  GAIT: -3 point gait with  Left lateral lean, heavy support on device, AFO in place with mild, intermittent Rt toe scuff audible, Rt toe out >30 degrees.   FUNCTIONAL TESTS:  -10x STS, WC push off: 54.89sec   PATIENT SURVEYS:                                                                                                                                TREATMENT DATE 05/12/24:   Gait training:  -3# AW each LE - patient ambulated around 80 feet x 3 rds with cane, R AFO,  CGA- focusing on step length and R foot clearance.  -3# AW each LE- Fwd/backward gait around 15 feet x 5 with Cane- Focusing on erect posture and improving step length   TA:   Step tap  on 1 1/2 in step with RLE 2 x 10 reps (3# AW)   Step tap onto 5 step with LLE (PT keeping hand at knee to watch out for buckling- 3# AW- 2 x 10 reps      PATIENT EDUCATION: Education details: Gait quality and instuction in steps, STS, and below HEP Person educated: Patient Education method: Explanation, positive reinforcement, explicit instruction, handout Education comprehension: Able to verbalize and demo good understanding  HOME EXERCISE PROGRAM: Access Code: [email or web address] URL: [email or web address] Date: 05/05/2024 Prepared by: Reyes London  Exercises - Sit to Stand with Arms Crossed  - 3 x weekly - 3 sets - 10 reps - Step Taps on Low Step  - 3-5 x weekly - 3 sets - 10 reps - Side Stepping with Counter Support  - 1 x daily - 3-5 x weekly - 5 sets - Backward Walking with Counter Support  - 3-5 x weekly - 5 sets  GOALS: Goals reviewed with patient? No   SHORT TERM GOALS: Target date: 05/30/24  Pt to demonstrate tolerance to overground AMB >552ft without LOB at supervision level DME ad lib.  Baseline: Goal status: INITIAL  2.  Pt to demonstrate improvement in gait ergonomics AEB gait speed increase >20%.  Baseline:  Goal status: INITIAL  3.  Pt to report successful AMB in home ad lib at modified independent level without  any falls for most recent 4 weeks.  Baseline:  Goal status: INITIAL  LONG TERM GOALS: Target date: 07/11/24 Pt to demonstrate tolerance to overground AMB >584ft including surface variety simulating outside navigation: variable grades, surface stypes, incline, and intermittent setp up/down, without LOB at supervision level DME ad lib.  Baseline: Goal status: INITIAL  2.  Pt to demonstrate improvement in gait ergonomics AEB gait speed increase >40%.  Baseline:  Goal status: INITIAL  3.  Pt to demonstrate sustained standing tolerance >10 minutes for stationary functional household tasks.   Baseline:  Goal status: INITIAL  4. Patient will increase 10 meter walk test to > 0.6 m/s as to improve gait speed for better community ambulation and to reduce fall risk. Baseline: 05/05/2024= 0.14 m/s with cane Goal status: INITIAL   ASSESSMENT:  CLINICAL IMPRESSION: The patient demonstrates ongoing right-sided weakness post-CVA, limiting gait mechanics and stability. With three-pound ankle weights and use of a cane and right AFO, the patient ambulated eighty feet twice with contact guard assist, showing improved step length and intermittent difficulty with right foot clearance. Forward and backward gait over fifteen feet for repeated trials showed better posture and stride symmetry but continued need for cueing. Therapeutic activities focused on lower extremity control. Right step taps on a one-and-a-half-inch step required cueing for clearance. Left step taps on a five-inch step were performed with therapist support at the knee due to risk of right-sided buckling. Overall, the patient is making gradual gains in gait quality, stability, and motor control, and continues to benefit from skilled PT.    OBJECTIVE IMPAIRMENTS: Decreased knowledge of condition, decreased use of DME, decreased mobility, difficulty walking, decreased strength, decreased ROM. ACTIVITY LIMITATIONS: Lifting, standing,  walking, squatting, transfers, locomotion level PARTICIPATION LIMITATIONS: Cleaning, laundry, interpersonal relationships, driving, yardwork, community activity.  PERSONAL FACTORS: Age, behavior pattern, education, past/current experiences, transportation, profession  are  also affecting patient's functional outcome.  REHAB POTENTIAL: Good CLINICAL DECISION MAKING: Medium  EVALUATION COMPLEXITY: Moderate   PLAN:  PT FREQUENCY: 1-2x/week  PT DURATION: 12 weeks  PLANNED INTERVENTIONS: 97110-Therapeutic exercises, 97530- Therapeutic activity, 97112- Neuromuscular re-education, [phone]- Self Care, 02859- Manual therapy, [phone]- Gait training, [phone]- Orthotic/Prosthetic subsequent, H9716- Electrical stimulation (unattended), [phone]- Electrical stimulation (manual), Patient/Family education, Balance training, Stair training, Joint mobilization, Cognitive remediation, DME instructions, Wheelchair mobility training, Cryotherapy, and Moist heat  PLAN FOR NEXT SESSION:  -Increase gait intensity over short distance -Increase gait instability over shorter distance -more backward and lateral stepping    Reyes LOISE London, PT 05/12/2024, 12:36 PM  12:36 PM, 05/12/24 Chyrl London, PT Physical Therapist - [facility]  Outpatient Physical Therapy- Main Campus [phone]         "

## 2024-05-12 NOTE — Therapy (Signed)
 " OUTPATIENT SPEECH LANGUAGE PATHOLOGY  TREATMENT   Patient Name: Isaac Meza MRN: [id number] DOB:11/27/1970, 54 y.o., male Today's Date: 05/12/2024  PCP: Sharyle Fischer, DO REFERRING PROVIDER: Toribio Haggard, PA-C   End of Session - 05/12/24 0926     Visit Number 3    Number of Visits 24    Date for Recertification  07/11/24    SLP Start Time 0930    SLP Stop Time  1015    SLP Time Calculation (min) 45 min    Activity Tolerance Patient tolerated treatment well          Patient Active Problem List   Diagnosis Date Noted   Community acquired pneumonia 04/24/2024   Tobacco use disorder 04/24/2024   Infarction of left basal ganglia (HCC) 03/28/2024   CVA (cerebral vascular accident) (HCC) 03/17/2024   Stroke (HCC) 03/17/2024   Epigastric abdominal pain 12/06/2022   Hypertensive urgency 12/06/2022   Alcohol use disorder 12/06/2022   Erosive gastropathy 12/06/2022   Erythrocytosis 12/06/2022   Protein-calorie malnutrition, severe 08/01/2022   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   Abnormal CT of the abdomen 07/30/2022   Pancreatic cyst 07/30/2022   Nausea and vomiting 07/30/2022   Chronic pancreatitis (HCC) 07/29/2022   Dehydration 07/29/2022   Duodenal obstruction 07/27/2022   Acute appendicitis 05/16/2022   Essential hypertension 04/30/2022   Long term current use of opiate analgesic 09/12/2020   Bilateral thoracic back pain 10/13/2017    ONSET DATE: 03/17/24   REFERRING DIAG: I63.9 (ICD-10-CM) - Left basal ganglia embolic stroke (HCC)   THERAPY DIAG:  Dysarthria and anarthria  Cognitive communication deficit  Rationale for Evaluation and Treatment Rehabilitation  SUBJECTIVE:   SUBJECTIVE STATEMENT: Pt alert, pleasant, and cooperative.  Pt accompanied by: self  PERTINENT HISTORY & DIAGNOSTIC FINDINGS: Isaac Meza is a 54 year old right-handed male with history of childhood asthma hypertension and tobacco/alcohol use.Presented 03/17/2024 with of  right sided weakness and slurred speech with ataxic gait x 2 days. MRI of the brain showed patchy acute small vessel ischemic type infarcts in the left corona radiata tracking towards the posterior left lentiform. Pt was discharged to home on 03/19/2024. Patient was doing well until the morning of 03/23/2024 with increasing weakness right side weakness and worsening slurred speech. Repeat CT/MRI showed interval expansion and worsening and recently identified posterior left basal ganglia infarct now measuring up to 2.9 cm in size. No associated hemorrhage or significant regional mass effect. 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  Pt was admitted to CIR on 03/28/24-04/12/24.  PAIN:  Are you having pain? No   FALLS: Has patient fallen in last 6 months?  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 environmental modifications). Pt required rare-min cues for dysarthria strategies during sentence repetition task, min/mod cues during sentence reading task. Pt able to demo SFA with mod I during barrier task with semantic constraints (utilizing Taboo cards). Improved anomia and use of strategies noted during structured and unstructured tasks; however. Reduced carryover of dysarthria strategies at conversation level. Pt endorsed using EMST at home; pt to  bring to next session.   Executive functioning targeted with pt sequencing 5-6 step ADLs with >80% accuracy, improving to 100% with after errors were  identified.   Discussed progress to date, use of compensations, and SLP POC.    PATIENT EDUCATION: Education details: as above  Person educated: Patient Education method: Explanation Education comprehension: verbalized understanding and needs further education  HOME EXERCISE PROGRAM:        Practice dysarthria and anomia strategies; pt to bring EMST to next ST session    GOALS:  Goals reviewed with patient? Yes  SHORT TERM GOALS: Target date: 10 sessions  With Min A, patient will demo HEP for motor speech accurately in 4/5 opportunities.   Baseline: Goal status: INITIAL   2.  With Min A, patient will improve speech intelligibility for  phrase by controlling rate of speech, over-articulation, and increased loudness to achieve 80% intelligibility.   Baseline:  Goal status: INITIAL  3.  With Min A, patient will complete 5 sets of 5 repetitions with EMST set at training threshold x2 sessions.  Baseline:  Goal status: INITIAL  4.  Pt will utilize preferred anomia compensation with min A during structured tasks.    Baseline:  Goal status: INITIAL  5.  Given a functional problem (ie., social problem, multi-step math word problem, logic puzzle) or scenario from daily life, patient will demonstrate error awareness and correct errors independently with at least 80% acc.   Baseline:  Goal Status: INITIAL   LONG TERM GOALS: Target date: 12 weeks  Pt will will report improved communication per PROM. Baseline: CES 19/32 04/18/24 Goal status: INITIAL  2.  Pt will utilize preferred compensations for dysarthria and anomia to express complex information with min A.  Baseline:  Goal status: INITIAL  3.  Pt will demonstrate problem solving for complex daily situations with no more than min A.  Baseline:  Goal status: INITIAL    ASSESSMENT:  CLINICAL IMPRESSION:  Patient is a 54 y.o. male who was seen today for motor-speech and cognitive-communication evaluation in setting of  basal ganglia stroke. Pt with recent d/c from CIR.    Assessment completed via informal means, standardized assessment (Cognitive-Linguistic Quick Test), and PROM (Communication Effectiveness Survey).    Based on initial assessment, pt presents with a moderate-severe dysarthria impacting intelligibility across all levels of speech (~50% to an unfamiliar listener). Dysarthria c/b reduced vocal loudness, intermittent hoarse vocal quality, and articulatory imprecision. Additionally, pt with mild cognitive-communication deficits. Per CLQT, pt with difficulty with attention and executive functioning. Pt scored 0/12 on Symbol Cancellation due to marking the incorrect symbol 12 times. This subtest is weighted heavily in the attention subscore. SLP noted wordfinding deficits conversationally and during divergent naming subtest.    Pt highly motivated to return to [facility]. 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 He endorsed that his current deficits make communicating difficulty in a variety of settings with different communication partners.    See details of today's tx above.   Recommend course of ST for above mentioned deficits with emphasis on compensation, retraining, and education.   OBJECTIVE IMPAIRMENTS include attention, executive functioning, expressive language, and dysarthria. These impairments are limiting patient from effectively communicating at home and in community. Factors affecting potential to achieve goals and functional outcome are n/a. Patient will benefit from skilled SLP services to address above impairments and improve overall function.  REHAB POTENTIAL: Good  PLAN: SLP FREQUENCY: 2x/week  SLP  DURATION: 12 weeks  PLANNED INTERVENTIONS: Cueing hierachy, Cognitive reorganization, Internal/external aids, Functional tasks, Multimodal communication approach, SLP instruction and feedback, Compensatory strategies, and Patient/family  education    Isaac Meza, M.S., CCC-SLP Speech-Language Pathologist [facility] [phone] [place])  [facility] Outpatient Rehabilitation at [facility] [address] [place], [place], [address] Phone: [phone]   Fax:  [phone]             "

## 2024-05-17 ENCOUNTER — Inpatient Hospital Stay: Admitting: Neurology

## 2024-05-19 ENCOUNTER — Ambulatory Visit

## 2024-05-19 ENCOUNTER — Ambulatory Visit: Admitting: Occupational Therapy

## 2024-05-24 ENCOUNTER — Ambulatory Visit

## 2024-05-24 ENCOUNTER — Ambulatory Visit: Admitting: Occupational Therapy

## 2024-05-26 ENCOUNTER — Ambulatory Visit: Admitting: Internal Medicine

## 2024-05-31 ENCOUNTER — Ambulatory Visit

## 2024-05-31 ENCOUNTER — Ambulatory Visit: Admitting: Occupational Therapy

## 2024-06-07 ENCOUNTER — Ambulatory Visit

## 2024-06-07 ENCOUNTER — Ambulatory Visit: Admitting: Occupational Therapy

## 2024-06-14 ENCOUNTER — Ambulatory Visit

## 2024-06-14 ENCOUNTER — Ambulatory Visit: Admitting: Occupational Therapy

## 2024-06-21 ENCOUNTER — Ambulatory Visit

## 2024-06-21 ENCOUNTER — Ambulatory Visit: Admitting: Occupational Therapy

## 2024-06-28 ENCOUNTER — Ambulatory Visit

## 2024-06-28 ENCOUNTER — Ambulatory Visit: Admitting: Occupational Therapy

## 2024-07-05 ENCOUNTER — Ambulatory Visit

## 2024-07-05 ENCOUNTER — Ambulatory Visit: Admitting: Occupational Therapy

## 2024-07-12 ENCOUNTER — Ambulatory Visit

## 2024-07-12 ENCOUNTER — Ambulatory Visit: Admitting: Occupational Therapy

## 2024-07-19 ENCOUNTER — Ambulatory Visit

## 2024-07-19 ENCOUNTER — Ambulatory Visit: Admitting: Occupational Therapy

## 2024-07-26 ENCOUNTER — Ambulatory Visit: Admitting: Occupational Therapy

## 2024-07-26 ENCOUNTER — Ambulatory Visit

## 2024-08-02 ENCOUNTER — Ambulatory Visit

## 2024-08-02 ENCOUNTER — Ambulatory Visit: Admitting: Occupational Therapy

## 2024-08-04 ENCOUNTER — Ambulatory Visit: Admitting: Internal Medicine

## 2024-08-09 ENCOUNTER — Ambulatory Visit: Admitting: Occupational Therapy

## 2024-08-09 ENCOUNTER — Ambulatory Visit

## 2024-08-16 ENCOUNTER — Ambulatory Visit

## 2024-08-16 ENCOUNTER — Ambulatory Visit: Admitting: Occupational Therapy

## 2024-08-23 ENCOUNTER — Ambulatory Visit

## 2024-08-23 ENCOUNTER — Ambulatory Visit: Admitting: Occupational Therapy

## 2024-08-30 ENCOUNTER — Ambulatory Visit

## 2024-08-30 ENCOUNTER — Ambulatory Visit: Admitting: Occupational Therapy

## 2024-09-06 ENCOUNTER — Ambulatory Visit

## 2024-09-06 ENCOUNTER — Ambulatory Visit: Admitting: Occupational Therapy

## 2024-09-13 ENCOUNTER — Ambulatory Visit

## 2024-09-13 ENCOUNTER — Ambulatory Visit: Admitting: Occupational Therapy

## 2024-09-20 ENCOUNTER — Ambulatory Visit: Admitting: Occupational Therapy

## 2024-09-20 ENCOUNTER — Ambulatory Visit

## 2024-09-27 ENCOUNTER — Ambulatory Visit

## 2024-09-27 ENCOUNTER — Ambulatory Visit: Admitting: Occupational Therapy
